# Patient Record
Sex: Female | Born: 1935 | Race: White | Hispanic: No | Marital: Married | Smoking: Never smoker
Health system: Southern US, Community
[De-identification: ages and names within clinical notes are randomized; demographics above are authoritative.]

## PROBLEM LIST (undated history)

## (undated) DIAGNOSIS — I729 Aneurysm of unspecified site: Secondary | ICD-10-CM

## (undated) DIAGNOSIS — M1990 Unspecified osteoarthritis, unspecified site: Secondary | ICD-10-CM

## (undated) DIAGNOSIS — J45909 Unspecified asthma, uncomplicated: Secondary | ICD-10-CM

## (undated) DIAGNOSIS — N3281 Overactive bladder: Secondary | ICD-10-CM

## (undated) DIAGNOSIS — N8110 Cystocele, unspecified: Secondary | ICD-10-CM

## (undated) DIAGNOSIS — K219 Gastro-esophageal reflux disease without esophagitis: Secondary | ICD-10-CM

## (undated) DIAGNOSIS — I82409 Acute embolism and thrombosis of unspecified deep veins of unspecified lower extremity: Secondary | ICD-10-CM

## (undated) DIAGNOSIS — R1310 Dysphagia, unspecified: Secondary | ICD-10-CM

## (undated) DIAGNOSIS — R1319 Other dysphagia: Secondary | ICD-10-CM

## (undated) DIAGNOSIS — I480 Paroxysmal atrial fibrillation: Secondary | ICD-10-CM

## (undated) DIAGNOSIS — I10 Essential (primary) hypertension: Secondary | ICD-10-CM

## (undated) DIAGNOSIS — I639 Cerebral infarction, unspecified: Secondary | ICD-10-CM

## (undated) DIAGNOSIS — M810 Age-related osteoporosis without current pathological fracture: Secondary | ICD-10-CM

## (undated) HISTORY — PX: WRIST FRACTURE SURGERY: SHX121

## (undated) HISTORY — PX: LEG SURGERY: SHX1003

## (undated) HISTORY — DX: Aneurysm of unspecified site: I72.9

## (undated) HISTORY — PX: NASAL SEPTUM SURGERY: SHX37

## (undated) HISTORY — PX: ABDOMINAL HYSTERECTOMY: SHX81

## (undated) HISTORY — DX: Essential (primary) hypertension: I10

## (undated) HISTORY — DX: Gastro-esophageal reflux disease without esophagitis: K21.9

## (undated) HISTORY — DX: Other dysphagia: R13.19

## (undated) HISTORY — DX: Cystocele, unspecified: N81.10

## (undated) HISTORY — DX: Dysphagia, unspecified: R13.10

## (undated) HISTORY — PX: ESOPHAGEAL DILATION: SHX303

## (undated) HISTORY — PX: ESOPHAGOGASTRODUODENOSCOPY: SHX1529

## (undated) HISTORY — PX: TONSILLECTOMY: SUR1361

---

## 1998-06-06 ENCOUNTER — Other Ambulatory Visit: Admission: RE | Admit: 1998-06-06 | Discharge: 1998-06-06 | Payer: Self-pay

## 2001-08-10 ENCOUNTER — Encounter: Payer: Self-pay | Admitting: Family Medicine

## 2001-08-10 ENCOUNTER — Ambulatory Visit (HOSPITAL_COMMUNITY): Admission: RE | Admit: 2001-08-10 | Discharge: 2001-08-10 | Payer: Self-pay | Admitting: Family Medicine

## 2001-08-20 ENCOUNTER — Ambulatory Visit (HOSPITAL_COMMUNITY): Admission: RE | Admit: 2001-08-20 | Discharge: 2001-08-20 | Payer: Self-pay | Admitting: Family Medicine

## 2001-08-20 ENCOUNTER — Encounter: Payer: Self-pay | Admitting: Family Medicine

## 2001-11-25 ENCOUNTER — Ambulatory Visit (HOSPITAL_COMMUNITY): Admission: RE | Admit: 2001-11-25 | Discharge: 2001-11-25 | Payer: Self-pay | Admitting: Internal Medicine

## 2002-02-10 ENCOUNTER — Encounter: Payer: Self-pay | Admitting: Internal Medicine

## 2002-02-10 ENCOUNTER — Ambulatory Visit (HOSPITAL_COMMUNITY): Admission: RE | Admit: 2002-02-10 | Discharge: 2002-02-10 | Payer: Self-pay | Admitting: Internal Medicine

## 2002-05-17 ENCOUNTER — Ambulatory Visit (HOSPITAL_COMMUNITY): Admission: RE | Admit: 2002-05-17 | Discharge: 2002-05-17 | Payer: Self-pay | Admitting: Internal Medicine

## 2002-07-14 ENCOUNTER — Encounter: Payer: Self-pay | Admitting: Family Medicine

## 2002-07-14 ENCOUNTER — Ambulatory Visit (HOSPITAL_COMMUNITY): Admission: RE | Admit: 2002-07-14 | Discharge: 2002-07-14 | Payer: Self-pay | Admitting: Family Medicine

## 2002-08-26 ENCOUNTER — Encounter: Payer: Self-pay | Admitting: Otolaryngology

## 2002-08-26 ENCOUNTER — Ambulatory Visit (HOSPITAL_COMMUNITY): Admission: RE | Admit: 2002-08-26 | Discharge: 2002-08-26 | Payer: Self-pay | Admitting: Ophthalmology

## 2003-09-15 ENCOUNTER — Ambulatory Visit (HOSPITAL_COMMUNITY): Admission: RE | Admit: 2003-09-15 | Discharge: 2003-09-15 | Payer: Self-pay | Admitting: Family Medicine

## 2004-02-01 ENCOUNTER — Ambulatory Visit (HOSPITAL_COMMUNITY): Admission: RE | Admit: 2004-02-01 | Discharge: 2004-02-01 | Payer: Self-pay | Admitting: Internal Medicine

## 2004-02-01 HISTORY — PX: COLONOSCOPY W/ BIOPSIES: SHX1374

## 2004-09-18 ENCOUNTER — Ambulatory Visit (HOSPITAL_COMMUNITY): Admission: RE | Admit: 2004-09-18 | Discharge: 2004-09-18 | Payer: Self-pay | Admitting: Family Medicine

## 2004-10-01 ENCOUNTER — Encounter: Admission: RE | Admit: 2004-10-01 | Discharge: 2004-10-01 | Payer: Self-pay | Admitting: Family Medicine

## 2005-10-28 ENCOUNTER — Ambulatory Visit (HOSPITAL_COMMUNITY): Admission: RE | Admit: 2005-10-28 | Discharge: 2005-10-28 | Payer: Self-pay | Admitting: Family Medicine

## 2006-11-25 ENCOUNTER — Other Ambulatory Visit: Admission: RE | Admit: 2006-11-25 | Discharge: 2006-11-25 | Payer: Self-pay | Admitting: Family Medicine

## 2009-10-02 ENCOUNTER — Encounter (HOSPITAL_COMMUNITY): Admission: RE | Admit: 2009-10-02 | Discharge: 2009-10-13 | Payer: Self-pay | Admitting: Family Medicine

## 2010-11-03 ENCOUNTER — Encounter: Payer: Self-pay | Admitting: Family Medicine

## 2010-12-28 ENCOUNTER — Ambulatory Visit
Admission: RE | Admit: 2010-12-28 | Discharge: 2010-12-28 | Disposition: A | Discharge status: Home / Self Care | Payer: Medicare Other | Source: Ambulatory Visit | Attending: Orthopedic Surgery | Admitting: Orthopedic Surgery

## 2010-12-28 ENCOUNTER — Other Ambulatory Visit: Payer: Self-pay | Admitting: Orthopedic Surgery

## 2010-12-28 DIAGNOSIS — T148XXA Other injury of unspecified body region, initial encounter: Secondary | ICD-10-CM

## 2011-01-01 ENCOUNTER — Encounter (HOSPITAL_COMMUNITY)
Admission: RE | Admit: 2011-01-01 | Discharge: 2011-01-01 | Disposition: A | Discharge status: Home / Self Care | Payer: Medicare Other | Source: Ambulatory Visit | Attending: Orthopedic Surgery | Admitting: Orthopedic Surgery

## 2011-01-01 DIAGNOSIS — Z01812 Encounter for preprocedural laboratory examination: Secondary | ICD-10-CM | POA: Insufficient documentation

## 2011-01-01 LAB — APTT: aPTT: 32 seconds (ref 24–37)

## 2011-01-01 LAB — BASIC METABOLIC PANEL
BUN: 16 mg/dL (ref 6–23)
CO2: 25 mEq/L (ref 19–32)
Calcium: 8.3 mg/dL — ABNORMAL LOW (ref 8.4–10.5)
Chloride: 105 mEq/L (ref 96–112)
Creatinine, Ser: 0.75 mg/dL (ref 0.4–1.2)
GFR calc Af Amer: >60 mL/min (ref >60)
GFR calc non Af Amer: >60 mL/min (ref >60)
Glucose, Bld: 120 mg/dL — ABNORMAL HIGH (ref 70–99)
Potassium: 4.1 mEq/L (ref 3.5–5.1)
Sodium: 136 mEq/L (ref 135–145)

## 2011-01-01 LAB — CBC
HCT: 32.3 % — ABNORMAL LOW (ref 36.0–46.0)
Hemoglobin: 10.3 g/dL — ABNORMAL LOW (ref 12.0–15.0)
MCH: 26.1 pg (ref 26.0–34.0)
MCHC: 31.9 g/dL (ref 30.0–36.0)
MCV: 82 fL (ref 78.0–100.0)
Platelets: 251 10*3/uL (ref 150–400)
RBC: 3.94 MIL/uL (ref 3.87–5.11)
RDW: 15.3 % (ref 11.5–15.5)
WBC: 6.2 10*3/uL (ref 4.0–10.5)

## 2011-01-01 LAB — SURGICAL PCR SCREEN
MRSA, PCR: NEGATIVE
Staphylococcus aureus: NEGATIVE

## 2011-01-01 LAB — PROTIME-INR
INR: 1.44 (ref 0.00–1.49)
Prothrombin Time: 17.7 seconds — ABNORMAL HIGH (ref 11.6–15.2)

## 2011-01-02 ENCOUNTER — Observation Stay (HOSPITAL_COMMUNITY)
Admission: RE | Admit: 2011-01-02 | Discharge: 2011-01-04 | Disposition: A | Discharge status: Home / Self Care | Payer: Medicare Other | Source: Ambulatory Visit | Attending: Orthopedic Surgery | Admitting: Orthopedic Surgery

## 2011-01-02 DIAGNOSIS — Z01812 Encounter for preprocedural laboratory examination: Secondary | ICD-10-CM | POA: Insufficient documentation

## 2011-01-02 DIAGNOSIS — X58XXXA Exposure to other specified factors, initial encounter: Secondary | ICD-10-CM | POA: Insufficient documentation

## 2011-01-02 DIAGNOSIS — S52599A Other fractures of lower end of unspecified radius, initial encounter for closed fracture: Principal | ICD-10-CM | POA: Insufficient documentation

## 2011-01-02 DIAGNOSIS — J45909 Unspecified asthma, uncomplicated: Secondary | ICD-10-CM | POA: Insufficient documentation

## 2011-01-04 LAB — PROTIME-INR
INR: 1.43 (ref 0.00–1.49)
Prothrombin Time: 17.6 seconds — ABNORMAL HIGH (ref 11.6–15.2)

## 2011-01-09 NOTE — Op Note (Signed)
NAMEAmariss, Detamore Evamae                    ACCOUNT NO.:  [id number]  MEDICAL RECORD NO.:  [id number]           PATIENT TYPE:  O  LOCATION:  5035                         FACILITY:  [facility]  PHYSICIAN:  Madelynn Done, MD  DATE OF BIRTH:  [birth year]  DATE OF PROCEDURE:  01/02/2011 DATE OF DISCHARGE:                              OPERATIVE REPORT   PREOPERATIVE DIAGNOSIS:  Left wrist highly comminuted intra-articular distal radius fracture, 4 or more fragments.  POSTOPERATIVE DIAGNOSIS:  Left wrist highly comminuted intra-articular distal radius fracture, 4 or more fragments.  ATTENDING PHYSICIAN:  Madelynn Done, MD, who scrubbed and present for the entire procedure.  ASSISTANT SURGEON:  None.  ANESTHESIA:  Supraclavicular block and general anesthesia versus general anesthesia with LMA.  SURGICAL PROCEDURES: 1. Open treatment of left wrist intra-articular distal radius     fracture, 4 or more fragments. 2. Stress radiography, left wrist.  SURGICAL IMPLANTS:  Synthes two-column volar plate, 6 distal locking pegs and screws, and three bicortical screws proximally, one nonlocking and two locking.  SURGICAL INDICATIONS:  Ms. Peltz is an 77 year old female who sustained a closed injury to her left distal radius.  The patient seen and evaluated at [facility] and given the nature and degree of displacement, she was seen and evaluated in my office.  It was recommended that she undergo the above procedure.  Risks, benefits, and alternatives were discussed in detail with the patient and a signed informed consent was obtained.  Risks include but not limited to bleeding; infection; damage to nearby nerves, arteries, or tendons; loss of motion of the wrist and digits; and need for further surgical intervention; malunion; nonunion; hardware failure; need for further surgical intervention.  Intraoperative bone grafting, Synthes DBX 2.5 mL mixture.  SURGICAL PROCEDURE:  The patient was  appropriately identified in the preop holding area.  A mark with a permanent marker made on the left wrist to indicate the correct operative site.  The patient was then brought back to the operating room and placed supine on the anesthesia table.  The patient had undergone supraclavicular block performed by Dr. Noreene Larsson.  The patient was then brought back to the operating room and placed supine on the anesthesia table.  General anesthesia was ministered.  A well-padded tourniquet was then placed in the left brachium, sealed with 1000 drape.  Left upper extremity was prepped and draped in normal sterile fashion.  Time-out was called and correct site was identified and procedure was then begun.  The patient received preoperative antibiotics prior to skin incision.  Time-out was called and correct site was identified and the procedure was then begun.  Based on the volar displacement, it was felt to indicate to proceed with a volar approach.  Limb was then elevated and tourniquet insufflated.  A longitudinal incision was made directly over the FCR sheath.  Dissection was then carried down through the skin and subcutaneous tissues.  The FCR sheath was then opened both proximally and distally.  The FCR was then retracted into respective direction.  Going through the floor of the FCR, the  FPL was identified.  The pronator quadratus was then identified and elevated.  The fracture site was then opened.  The patient did have high degree of comminution, and a very poor bone quality.  The Synthes DBX bone graft hybrid was then placed into the wrist in the defect and packed dorsally.  Following this, a longitudinal traction was then applied and the volar plate was then applied to the volar surface.  It was then fixed distally with the K-wire and then using the oblong screw hole, the 2.7-mm screw was then placed and the plate height was then adjusted using the mini C-arm.  After a mini C-arm guidance and  placement of the plate, distal fixation was then begun beginning from an ulnar to radial direction.  A combination of distal locking pegs and screws were then placed, a total of 6 fixation points distally.  This served as a nice buttress of the volar displacement. Following this, two more bicortical locking screws were then placed proximally.  The wounds were then thoroughly irrigated.  Stress radiography was then carried out showing good reduction and alignment of the distal radioulnar joint and volar rim.  The patient did have the articular comminution.  This was a 4 or more fragments, highly comminuted fracture.  It was stable and felt to be in stable position after volar plate fixation.  Copious irrigation was done throughout. The pronator quadratus was closed with 2-0 Vicryl.  Subcutaneous tissue was closed with 4-0 Vicryl.  The skin closed with 4-0 Prolene horizontal mattress sutures.  Adaptic dressing, sterile compressive bandage was then applied.  The patient was then placed in a well-padded sugar-tong splint.  She was then extubated and taken to the recovery room in good condition.  POSTOPERATIVE PLAN:  The patient will be admitted for IV antibiotics and pain control, discharge in the morning, seen back in the office in approximately 12 days, x-rays, application of a long-arm cast for a total of 3 weeks, short-arm cast for 2 weeks, and then begin a therapy regimen.  Radiographs at each visit.     Madelynn Done, MD     FWO/MEDQ  D:  01/02/2011  T:  01/03/2011  Job:  [id number]  Electronically Signed by Bradly Bienenstock IV MD on 01/09/2011 02:52:56 PM

## 2011-01-09 NOTE — Discharge Summary (Signed)
  NAMEBlasa, Raisch Garcia                    ACCOUNT NO.:  [id number]  MEDICAL RECORD NO.:  [id number]           PATIENT TYPE:  O  LOCATION:  5035                         FACILITY:  [facility]  PHYSICIAN:  Madelynn Done, MD  DATE OF BIRTH:  [birth year]  DATE OF ADMISSION:  01/02/2011 DATE OF DISCHARGE:  01/04/2011                              DISCHARGE SUMMARY   REASON FOR ADMISSION:  Left distal radius fracture.  DISCHARGE DIAGNOSIS:  Left distal radius fracture.  PROCEDURE:  Open reduction and internal fixation of displaced distal radius fracture __________  DISCHARGE MEDICATIONS: 1. Percocet 5/325 1-2 tablets q.4-6 h. as needed for pain. 2. Colace 100 mg p.o. b.i.d. 3. Robaxin 500 mg p.o. p.r.n. spasm. 4. Resume home medications and doses.  BRIEF HISTORY:  Mr. Sara Garcia is an 77 year old right-hand-dominant female who sustained a closed injury to her left distal radius.  The patient seen and evaluated in the office and recommended to undergo the above procedure following her injury.  The patient was admitted for IV antibiotics and pain control.  HOSPITAL COURSE:  The patient was admitted to the orthopedic service following the surgery.  The patient tolerated general anesthetic.  The patient was continued on the IV pain medication on postoperative day #1. She was seen by physical therapy on postop day #2 and felt ready to be discharged home.  On the day of discharge, the patient is afebrile with vital signs stable and normal, tolerating regular diet, felt ready to be discharged home.  RECOMMENDATIONS AND DISPOSITION:  The patient will be discharged to home and will be seen back in the office in approximately 2 weeks for repeat wound check.  Keep ice, elevate, keep her bandage on it all times.  She is going to come back.  She is to continue the above discharge medications.  Prior to discharge, the patient is the patient's discharge instructions were explained to her, questions answered,  and the patient voiced understanding of plan.  CONDITION ON DISCHARGE:  Good.     Madelynn Done, MD    FWO/MEDQ  D:  01/04/2011  T:  01/05/2011  Job:  [id number]  Electronically Signed by Bradly Bienenstock IV MD on 01/09/2011 02:53:01 PM

## 2011-03-01 NOTE — Op Note (Signed)
NAME:  Sara Garcia, Sara Garcia                              ACCOUNT NO.:  [id number]   MEDICAL RECORD NO.:  [id number]                   PATIENT TYPE:  AMB   LOCATION:  DAY                                  FACILITY:  [facility]   PHYSICIAN:  R. Roetta Sessions, M.D.              DATE OF BIRTH:  October 28, 1927   DATE OF PROCEDURE:  DATE OF DISCHARGE:                                 OPERATIVE REPORT   PROCEDURE:  Colonoscopy with snare polypectomy and biopsy.   ENDOSCOPIST:  Gerrit Friends. Rourk, M.D.   INDICATIONS FOR PROCEDURE:  The patient is a 77 year old lady sent over at  the courtesy of Dr. Dimple Casey for colorectal cancer screening.  She has never had  a colonoscopy.  She is devoid of any lower GI tract symptoms.  There is no  family history of colorectal neoplasia.  Colonoscopy is now being done as a  standard screening maneuver.  This approach has been discussed with the  patient at length.  The potential risks, benefits, and alternatives have  been reviewed; questions answered.  She is agreeable.  Please see my  handwritten H&P for more information.   PROCEDURE NOTE:  O2 saturation, blood pressure, pulse and respirations were  monitored throughout the entire procedure.  Conscious sedation: Versed 4 mg IV, Demerol 50 mg IV in divided doses.   INSTRUMENT:  Olympus pediatric colonoscope.   FINDINGS:  A digital exam revealed no abnormalities.   ENDOSCOPIC FINDINGS:  The prep was good.   RECTUM:  Examination of the rectal mucosa including a retroflex view of the  anal verge revealed two anal papillae.  Otherwise negative rectal mucosa.   COLON:  The colonic mucosa was surveyed from the rectosigmoid junction  through the left transverse and right colon to the area of the appendiceal  orifice, ileocecal valve, and cecum.  These structures were well seen and  photographed for the record.   From this level the scope was slowly withdrawn.  All previously mentioned  mucosal surfaces were again seen.  The  patient was noted to have two 4-mm  polyps in the cecum. One was cold snared, the other was cold biopsied.  The  patient was also noted to have sigmoid diverticula.  The remainder of the  colonic mucosa appeared normal.  The patient tolerated the procedure well  and was reacted in endoscopy.   IMPRESSION:  1. Anal papilla with normal rectum.  2. Sigmoid diverticula.  3. Small polyps in the cecum removed as described above.   RECOMMENDATIONS:  1. Diverticulosis literature provided to the patient.  2. Follow up on path.  3. Further recommendations to follow.      ___________________________________________  Jonathon Bellows, M.D.   RMR/MEDQ  D:  02/01/2004  Garcia:  02/01/2004  Job:  [id number]   cc:   Magnus Sinning. Dimple Casey, M.D.  [address] [place]  [place] [address]  Fax: [phone]   R. Roetta Sessions, M.D.  [address]  [place]  [place] [address]  Fax: [phone]

## 2013-03-19 DIAGNOSIS — M6281 Muscle weakness (generalized): Secondary | ICD-10-CM

## 2013-10-20 ENCOUNTER — Observation Stay (HOSPITAL_COMMUNITY)
Admission: EM | Admit: 2013-10-20 | Discharge: 2013-10-21 | Disposition: A | Discharge status: Home / Self Care | Payer: Medicare HMO | Attending: Family Medicine | Admitting: Family Medicine

## 2013-10-20 ENCOUNTER — Other Ambulatory Visit: Payer: Self-pay

## 2013-10-20 ENCOUNTER — Encounter (HOSPITAL_COMMUNITY): Payer: Self-pay | Admitting: Emergency Medicine

## 2013-10-20 ENCOUNTER — Emergency Department (HOSPITAL_COMMUNITY): Payer: Medicare HMO

## 2013-10-20 DIAGNOSIS — R55 Syncope and collapse: Principal | ICD-10-CM | POA: Diagnosis present

## 2013-10-20 DIAGNOSIS — Z7901 Long term (current) use of anticoagulants: Secondary | ICD-10-CM | POA: Insufficient documentation

## 2013-10-20 DIAGNOSIS — Z86718 Personal history of other venous thrombosis and embolism: Secondary | ICD-10-CM | POA: Insufficient documentation

## 2013-10-20 HISTORY — DX: Unspecified asthma, uncomplicated: J45.909

## 2013-10-20 HISTORY — DX: Age-related osteoporosis without current pathological fracture: M81.0

## 2013-10-20 HISTORY — DX: Unspecified osteoarthritis, unspecified site: M19.90

## 2013-10-20 HISTORY — DX: Acute embolism and thrombosis of unspecified deep veins of unspecified lower extremity: I82.409

## 2013-10-20 LAB — CBC WITH DIFFERENTIAL/PLATELET
Basophils Absolute: 0 10*3/uL (ref 0.0–0.1)
Basophils Relative: 1 % (ref 0–1)
Eosinophils Absolute: 0.2 10*3/uL (ref 0.0–0.7)
Eosinophils Relative: 3 % (ref 0–5)
HCT: 40.7 % (ref 36.0–46.0)
Hemoglobin: 13.4 g/dL (ref 12.0–15.0)
Lymphocytes Relative: 26 % (ref 12–46)
Lymphs Abs: 1.8 10*3/uL (ref 0.7–4.0)
MCH: 30.8 pg (ref 26.0–34.0)
MCHC: 32.9 g/dL (ref 30.0–36.0)
MCV: 93.6 fL (ref 78.0–100.0)
Monocytes Absolute: 0.3 10*3/uL (ref 0.1–1.0)
Monocytes Relative: 5 % (ref 3–12)
Neutro Abs: 4.5 10*3/uL (ref 1.7–7.7)
Neutrophils Relative %: 65 % (ref 43–77)
Platelets: 218 10*3/uL (ref 150–400)
RBC: 4.35 MIL/uL (ref 3.87–5.11)
RDW: 14 % (ref 11.5–15.5)
WBC: 6.9 10*3/uL (ref 4.0–10.5)

## 2013-10-20 LAB — URINALYSIS, ROUTINE W REFLEX MICROSCOPIC
Bilirubin Urine: NEGATIVE
Glucose, UA: NEGATIVE mg/dL
Hgb urine dipstick: NEGATIVE
Ketones, ur: NEGATIVE mg/dL
Leukocytes, UA: NEGATIVE
Nitrite: NEGATIVE
Protein, ur: NEGATIVE mg/dL
Specific Gravity, Urine: 1.015 (ref 1.005–1.030)
Urobilinogen, UA: 0.2 mg/dL (ref 0.0–1.0)
pH: 6 (ref 5.0–8.0)

## 2013-10-20 LAB — BASIC METABOLIC PANEL
BUN: 21 mg/dL (ref 6–23)
CO2: 27 mEq/L (ref 19–32)
Calcium: 9 mg/dL (ref 8.4–10.5)
Chloride: 103 mEq/L (ref 96–112)
Creatinine, Ser: 0.81 mg/dL (ref 0.50–1.10)
GFR calc Af Amer: 76 mL/min — ABNORMAL LOW (ref >90)
GFR calc non Af Amer: 65 mL/min — ABNORMAL LOW (ref >90)
Glucose, Bld: 113 mg/dL — ABNORMAL HIGH (ref 70–99)
Potassium: 4.1 mEq/L (ref 3.7–5.3)
Sodium: 141 mEq/L (ref 137–147)

## 2013-10-20 LAB — PROTIME-INR
INR: 1.72 — ABNORMAL HIGH (ref 0.00–1.49)
Prothrombin Time: 19.7 seconds — ABNORMAL HIGH (ref 11.6–15.2)

## 2013-10-20 LAB — TROPONIN I
Troponin I: <0.3 ng/mL (ref <0.30)
Troponin I: <0.3 ng/mL (ref <0.30)

## 2013-10-20 MED ORDER — IOHEXOL 300 MG/ML  SOLN
50.0000 mL | Freq: Once | INTRAMUSCULAR | Status: AC | PRN
  Administered 2013-10-20: 50 mL via ORAL

## 2013-10-20 MED ORDER — MEMANTINE HCL 10 MG PO TABS
5.0000 mg | ORAL_TABLET | Freq: Two times a day (BID) | ORAL | Status: DC
  Administered 2013-10-20 – 2013-10-21 (×2): 5 mg via ORAL
  Filled 2013-10-20 (×2): qty 1

## 2013-10-20 MED ORDER — PANTOPRAZOLE SODIUM 40 MG PO TBEC
40.0000 mg | DELAYED_RELEASE_TABLET | Freq: Every day | ORAL | Status: DC
  Administered 2013-10-21: 40 mg via ORAL
  Filled 2013-10-20 (×2): qty 1

## 2013-10-20 MED ORDER — DULOXETINE HCL 60 MG PO CPEP
60.0000 mg | ORAL_CAPSULE | Freq: Every day | ORAL | Status: DC
  Administered 2013-10-21: 60 mg via ORAL
  Filled 2013-10-20 (×2): qty 1

## 2013-10-20 MED ORDER — ACETAMINOPHEN 500 MG PO TABS
1000.0000 mg | ORAL_TABLET | Freq: Every day | ORAL | Status: DC
  Administered 2013-10-21: 1000 mg via ORAL
  Filled 2013-10-20 (×3): qty 2

## 2013-10-20 MED ORDER — WARFARIN SODIUM 5 MG PO TABS
5.0000 mg | ORAL_TABLET | Freq: Every day | ORAL | Status: DC
  Administered 2013-10-20 – 2013-10-21 (×2): 5 mg via ORAL
  Filled 2013-10-20 (×2): qty 1

## 2013-10-20 MED ORDER — VITAMIN B-6 50 MG PO TABS
100.0000 mg | ORAL_TABLET | Freq: Every day | ORAL | Status: DC
Start: 2013-10-21 — End: 2013-10-21
  Administered 2013-10-21: 100 mg via ORAL
  Filled 2013-10-20 (×2): qty 2

## 2013-10-20 MED ORDER — VITAMIN B-12 1000 MCG PO TABS
1000.0000 ug | ORAL_TABLET | Freq: Every day | ORAL | Status: DC
Start: 2013-10-21 — End: 2013-10-21
  Administered 2013-10-21: 1000 ug via ORAL
  Filled 2013-10-20 (×2): qty 1

## 2013-10-20 MED ORDER — SODIUM CHLORIDE 0.9 % IV BOLUS (SEPSIS)
1000.0000 mL | Freq: Once | INTRAVENOUS | Status: AC
  Administered 2013-10-20: 1000 mL via INTRAVENOUS

## 2013-10-20 MED ORDER — IOHEXOL 350 MG/ML SOLN
100.0000 mL | Freq: Once | INTRAVENOUS | Status: AC | PRN
Start: 2013-10-20 — End: 2013-10-20
  Administered 2013-10-20: 100 mL via INTRAVENOUS

## 2013-10-20 MED ORDER — SODIUM CHLORIDE 0.9 % IV BOLUS (SEPSIS)
500.0000 mL | Freq: Once | INTRAVENOUS | Status: AC
  Administered 2013-10-20: 500 mL via INTRAVENOUS

## 2013-10-20 MED ORDER — SODIUM CHLORIDE 0.9 % IJ SOLN
3.0000 mL | Freq: Two times a day (BID) | INTRAMUSCULAR | Status: DC
  Administered 2013-10-20: 3 mL via INTRAVENOUS

## 2013-10-20 NOTE — ED Notes (Signed)
ems states pt started having abd pain. When got out of car pt had syncopal episode. Pt did not fall. cbg 102 in route. NSR with EMS. Alert/oriented at present with hx of dementia.

## 2013-10-20 NOTE — H&P (Signed)
ELLEANOR GUYETT MRN: [id number] DOB/AGE: 78-Mar-1929 78 y.o. Primary Care Physician:BURDINE,STEVEN E, MD Admit date: 10/20/2013 Chief Complaint: Syncope. HPI: This very pleasant 80 year old lady presents with a syncopal episode. Earlier today, approximately 9 hours ago she started feeling unwell while driving her car for a doctor's appointment of her husband. She had lower abdominal pain which was cramping in nature and associated with a hot feeling throughout her body. This was also associated with some visual changes and inability to focus. When she arrived at the doctor's office, she got from her car and her legs gave way and then she lost consciousness. She thinks she lost consciousness for approximately 5 minutes. There was no witnessed tongue biting, shaking of her limbs or any change in color of her body. She regained consciousness and was completely orientated. She was taken to the emergency room for further evaluation. She does feel perfectly fine now and is back to her normal self. She denies any chest pain, palpitations or any limb weakness. She had a similar episode approximately 6 months ago. Investigations of this at another hospital did not reveal any abnormalities.  Past Medical History  Diagnosis Date  . Arthritis   . Asthma   . Osteoporosis   . DVT (deep venous thrombosis)   . Dementia    Past Surgical History  Procedure Laterality Date  . Abdominal hysterectomy    . Nasal septum surgery          History reviewed. No pertinent family history.  Social History:  reports that she has never smoked. She does not have any smokeless tobacco history on file. She reports that she does not drink alcohol or use illicit drugs. She is married and lives with her husband.  Allergies:  Allergies  Allergen Reactions  . Adhesive [Tape]   . Ciprofloxacin     vomiting  . Codeine     Upsets stomach, nightmares  . Latex     Breaking out and itching  . Penicillins     hives  . Sulfa  Antibiotics     hives    Medications Prior to Admission  Medication Sig Dispense Refill  . acetaminophen (TYLENOL) 500 MG tablet Take 1,000 mg by mouth daily.      . DULoxetine (CYMBALTA) 60 MG capsule Take 60 mg by mouth daily.      Marland Kitchen esomeprazole (NEXIUM) 40 MG capsule Take 40 mg by mouth daily at 12 noon.      . memantine (NAMENDA) 5 MG tablet Take 5 mg by mouth 2 (two) times daily.      Marland Kitchen pyridOXINE (VITAMIN B-6) 100 MG tablet Take 100 mg by mouth daily.      . vitamin B-12 (CYANOCOBALAMIN) 1000 MCG tablet Take 1,000 mcg by mouth daily.      Marland Kitchen warfarin (COUMADIN) 5 MG tablet Take 5 mg by mouth daily.           LZJ:QBHAL from the symptoms mentioned above,there are no other symptoms referable to all systems reviewed.  Physical Exam: Blood pressure 147/84, pulse 120, temperature 98.1 F (36.7 C), temperature source Oral, resp. rate 20, height 5\' 2"  (1.575 m), weight 61.5 kg (135 lb 9.3 oz), SpO2 97.00%. She looks systemically well. She is alert and oriented without any focal neurological signs. Heart sounds are present without any murmurs. There is no gallop rhythm. There are no carotid bruits. Lung fields are clear. Abdomen is soft and nontender without any hepatosplenomegaly. There is no evidence of abdominal aortic aneurysm.  Recent Labs  10/20/13 1229  WBC 6.9  NEUTROABS 4.5  HGB 13.4  HCT 40.7  MCV 93.6  PLT 218    Recent Labs  10/20/13 1229  NA 141  K 4.1  CL 103  CO2 27  GLUCOSE 113*  BUN 21  CREATININE 0.81  CALCIUM 9.0         Ct Head Wo Contrast  10/20/2013   CLINICAL DATA:  Syncope.  EXAM: CT HEAD WITHOUT CONTRAST  TECHNIQUE: Contiguous axial images were obtained from the base of the skull through the vertex without intravenous contrast.  COMPARISON:  Head CT 03/19/2013.  FINDINGS: Mild cerebral and cerebellar atrophy. Patchy and confluent areas of decreased attenuation are noted throughout the deep and periventricular white matter of the cerebral  hemispheres bilaterally, compatible with chronic microvascular ischemic disease. No acute intracranial abnormalities. Specifically, no evidence of acute intracranial hemorrhage, no definite findings of acute/subacute cerebral ischemia, no mass, mass effect, hydrocephalus or abnormal intra or extra-axial fluid collections. Visualized paranasal sinuses and mastoids are generally well pneumatized, with exception of mucoperiosteal thickening and opacification of the left sphenoid sinus, increased compared to the prior study. No acute displaced skull fractures are identified.  IMPRESSION: 1. No acute intracranial abnormalities. 2. Mild cerebral and cerebellar atrophy with extensive chronic microvascular ischemic changes in the cerebral white matter redemonstrated, as above. 3. Opacification and mucoperiosteal thickening in the left sphenoid sinus indicative of chronic sinusitis.   Electronically Signed   By: Vinnie Langton M.D.   On: 10/20/2013 16:35   Ct Angio Chest Pe W/cm &/or Wo Cm  10/20/2013   CLINICAL DATA:  Syncope. Left lower quadrant abdominal pain. Leg weakness. Sudden onset. History of deep vein thrombosis.  EXAM: CT ANGIOGRAPHY CHEST  CT ABDOMEN AND PELVIS WITH CONTRAST  TECHNIQUE: Multidetector CT imaging of the chest was performed using the standard protocol during bolus administration of intravenous contrast. Multiplanar CT image reconstructions including MIPs were obtained to evaluate the vascular anatomy. Multidetector CT imaging of the abdomen and pelvis was performed using the standard protocol during bolus administration of intravenous contrast.  CONTRAST:  77mL OMNIPAQUE IOHEXOL 300 MG/ML SOLN, 146mL OMNIPAQUE IOHEXOL 350 MG/ML SOLN  COMPARISON:  DG HIP COMPLETE 2+V BILAT dated 09/03/2010; DG CHEST 1V PORT dated 03/19/2013; CT ABD-PELV W/ CM dated 04/24/2010; CT ABD-PELV W/ CM dated 08/30/2008; CT ANGIO CHEST dated 01/16/2009  FINDINGS: CTA CHEST FINDINGS  No filling defect is identified in the  pulmonary arterial tree to suggest pulmonary embolus. No aortic dissection or acute aortic findings. Right infrahilar node short axis 0.8 cm. Volume loss in the right middle lobe is associated with a bandlike 6.5 by 2.2 by 1.9 cm opacity which appears more prominent than lobular than on prior exams.  Several bulla are present in the right lung. Subsegmental atelectasis, left lower lobe.  CT ABDOMEN and PELVIS FINDINGS  The liver, spleen, pancreas, and adrenal glands appear unremarkable. Gallbladder contracted. No significant biliary dilatation observed.  Dextroconvex lumbar scoliosis with rotary component. Borderline fullness of the left collecting system without hydroureter or stone observed.  Urinary bladder unremarkable. Orally administered contrast extends through to the rectum. Scattered sigmoid colon diverticular present. Rectum is slightly distended.  Terminal ileum unremarkable. No pathologic upper abdominal adenopathy is observed. No pathologic pelvic adenopathy is observed.  There is moderate degenerative arthropathy of both hips. Prominent lumbar spondylosis.  Review of the MIP images confirms the above findings.  IMPRESSION: 1. Increase in size and lobularity of the right middle lobe  opacity that has been shown on multiple prior exams. This likely represents progressive atelectasis/scarring given the fact that there is more downward retraction of the minor fissure, rather than a new underlying mass, but warrants observation. Consider followup chest CT in 6 months time. 2. No embolus or dissection identified. 3. Prominent lumbar spondylosis and scoliosis. 4. Scattered sigmoid colon diverticula. No active diverticulitis identified. 5. Mild rectal distention, likely incidental.   Electronically Signed   By: Sherryl Barters M.D.   On: 10/20/2013 17:04   Ct Abdomen Pelvis W Contrast  10/20/2013   CLINICAL DATA:  Syncope. Left lower quadrant abdominal pain. Leg weakness. Sudden onset. History of deep vein  thrombosis.  EXAM: CT ANGIOGRAPHY CHEST  CT ABDOMEN AND PELVIS WITH CONTRAST  TECHNIQUE: Multidetector CT imaging of the chest was performed using the standard protocol during bolus administration of intravenous contrast. Multiplanar CT image reconstructions including MIPs were obtained to evaluate the vascular anatomy. Multidetector CT imaging of the abdomen and pelvis was performed using the standard protocol during bolus administration of intravenous contrast.  CONTRAST:  86mL OMNIPAQUE IOHEXOL 300 MG/ML SOLN, 127mL OMNIPAQUE IOHEXOL 350 MG/ML SOLN  COMPARISON:  DG HIP COMPLETE 2+V BILAT dated 09/03/2010; DG CHEST 1V PORT dated 03/19/2013; CT ABD-PELV W/ CM dated 04/24/2010; CT ABD-PELV W/ CM dated 08/30/2008; CT ANGIO CHEST dated 01/16/2009  FINDINGS: CTA CHEST FINDINGS  No filling defect is identified in the pulmonary arterial tree to suggest pulmonary embolus. No aortic dissection or acute aortic findings. Right infrahilar node short axis 0.8 cm. Volume loss in the right middle lobe is associated with a bandlike 6.5 by 2.2 by 1.9 cm opacity which appears more prominent than lobular than on prior exams.  Several bulla are present in the right lung. Subsegmental atelectasis, left lower lobe.  CT ABDOMEN and PELVIS FINDINGS  The liver, spleen, pancreas, and adrenal glands appear unremarkable. Gallbladder contracted. No significant biliary dilatation observed.  Dextroconvex lumbar scoliosis with rotary component. Borderline fullness of the left collecting system without hydroureter or stone observed.  Urinary bladder unremarkable. Orally administered contrast extends through to the rectum. Scattered sigmoid colon diverticular present. Rectum is slightly distended.  Terminal ileum unremarkable. No pathologic upper abdominal adenopathy is observed. No pathologic pelvic adenopathy is observed.  There is moderate degenerative arthropathy of both hips. Prominent lumbar spondylosis.  Review of the MIP images confirms the  above findings.  IMPRESSION: 1. Increase in size and lobularity of the right middle lobe opacity that has been shown on multiple prior exams. This likely represents progressive atelectasis/scarring given the fact that there is more downward retraction of the minor fissure, rather than a new underlying mass, but warrants observation. Consider followup chest CT in 6 months time. 2. No embolus or dissection identified. 3. Prominent lumbar spondylosis and scoliosis. 4. Scattered sigmoid colon diverticula. No active diverticulitis identified. 5. Mild rectal distention, likely incidental.   Electronically Signed   By: Sherryl Barters M.D.   On: 10/20/2013 17:04   Impression: 1. Syncope of unclear etiology. 2. 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 3. History of dementia per chart, I think this is probably mild based on my examination of her today.     Plan: 1. Admit to telemetry. 2. Serial cardiac enzymes. 3. Echocardiogram. Based on negative physical examination, I would not be surprised if there are no obvious findings on investigations. Anticipate discharge in the morning if she remains clinically stable.      [place] C  10/20/2013, 7:57 PM

## 2013-10-20 NOTE — ED Provider Notes (Addendum)
CSN: [id number]     Arrival date & time 10/20/13  1211 History   First MD Initiated Contact with Patient 10/20/13 1344 This chart was scribed for Shaune Pollack, MD by Anastasia Pall, ED Scribe. This patient was seen in room APA17/APA17 and the patient's care was started at 2:08 PM.      Chief Complaint  Patient presents with  . Abdominal Pain    Patient is a 80 y.o. female presenting with syncope. The history is provided by the patient and a relative. No language interpreter was used.  Loss of Consciousness Episode history:  Multiple Most recent episode:  Today Progression:  Improving Chronicity:  Recurrent Context: sitting down   Witnessed: yes   Associated symptoms: dizziness (light-headedness) and weakness (bilateral LE)   Associated symptoms: no chest pain, no difficulty breathing, no fever, no headaches, no nausea, no recent injury, no visual change and no vomiting   Risk factors comment:  H/o multiple blood clot, syncopal episodes  HPI Comments: Sara Garcia is a 80 y.o. female brought in by EMS who presents to the Emergency Department complaining of one syncopal episode, onset earlier today when she was taking her husband to his doctor. She states she was in her car, and starting experiencing cramping, left, lower abdominal pain, mild light-headedness, weakness in her legs, and states she started not being able to focus her eyes. She reports she parked the car, and walked inside the doctor's office, and then passed out. She denies any injuries from her fall, stating someone caught her before she hit the ground. An ambulance was called after her episode.   She reports eating and drinking normally. She states she has eaten breakfast, but has not eaten lunch today. She reports having normal bowel movement today. She denies prior headache, fever, cough, nausea, vomiting, diarrhea, and any other associated symptoms.   She reports h/o osteoporosis, h/o multiple blood clots. Relative reports  leg swelling, pain, weakness, and syncopal episode prior to her first blood clot. She reports being on Coumadin due to h/o blood clot. She reports having her INR checked 1 month ago. She denies current chest pain. Relative reports that pt has h/o passing out due to not eating normally. Pt reports having urinary frequency and urgency for over 1 year. She denies h/o smoking.    PCP - Curlene Labrum, MD  Past Medical History  Diagnosis Date  . Arthritis   . Asthma   . Osteoporosis   . DVT (deep venous thrombosis)   . Dementia    Past Surgical History  Procedure Laterality Date  . Abdominal hysterectomy    . Nasal septum surgery     History reviewed. No pertinent family history. History  Substance Use Topics  . Smoking status: Never Smoker   . Smokeless tobacco: Not on file  . Alcohol Use: No   OB History   Grav Para Term Preterm Abortions TAB SAB Ect Mult Living                 Review of Systems  Constitutional: Negative for fever.  HENT: Negative for congestion.   Respiratory: Negative for cough.   Cardiovascular: Positive for syncope. Negative for chest pain.  Gastrointestinal: Positive for abdominal pain (cramping, lower). Negative for nausea, vomiting and diarrhea.  Genitourinary: Positive for urgency and frequency.  Musculoskeletal: Negative for arthralgias and myalgias.  Skin: Negative for wound.  Neurological: Positive for dizziness (light-headedness), syncope, weakness (bilateral LE) and light-headedness. Negative for headaches.  Allergies  Adhesive; Ciprofloxacin; Codeine; Latex; Penicillins; and Sulfa antibiotics  Home Medications   Current Outpatient Rx  Name  Route  Sig  Dispense  Refill  . acetaminophen (TYLENOL) 500 MG tablet   Oral   Take 1,000 mg by mouth daily.         . DULoxetine (CYMBALTA) 60 MG capsule   Oral   Take 60 mg by mouth daily.         Marland Kitchen esomeprazole (NEXIUM) 40 MG capsule   Oral   Take 40 mg by mouth daily at 12 noon.          . memantine (NAMENDA) 5 MG tablet   Oral   Take 5 mg by mouth 2 (two) times daily.         Marland Kitchen pyridOXINE (VITAMIN B-6) 100 MG tablet   Oral   Take 100 mg by mouth daily.         . vitamin B-12 (CYANOCOBALAMIN) 1000 MCG tablet   Oral   Take 1,000 mcg by mouth daily.         Marland Kitchen warfarin (COUMADIN) 5 MG tablet   Oral   Take 5 mg by mouth daily.          BP 132/77  Pulse 75  Temp(Src) 97.9 F (36.6 C)  Resp 18  SpO2 96%  Physical Exam  Nursing note and vitals reviewed. Constitutional: She is oriented to person, place, and time. She appears well-developed and well-nourished. No distress.  HENT:  Head: Normocephalic and atraumatic.  Eyes: EOM are normal.  Neck: Neck supple. No tracheal deviation present.  Cardiovascular: Normal rate, regular rhythm, normal heart sounds and intact distal pulses.   No murmur heard. Pulmonary/Chest: Effort normal and breath sounds normal. No respiratory distress. She has no wheezes. She has no rales.  Abdominal: There is tenderness (Diffuse LLQ tenderness to palpation.).  Musculoskeletal: Normal range of motion.  Neurological: She is alert and oriented to person, place, and time.  Skin: Skin is warm and dry.  Chronic skin changes over left LE.   Psychiatric: She has a normal mood and affect. Her behavior is normal.    ED Course  Procedures (including critical care time)  DIAGNOSTIC STUDIES: Oxygen Saturation is 96% on room air, normal by my interpretation.    COORDINATION OF CARE: 2:19 PM-Discussed treatment plan which includes CXR, CT abdomen with pt at bedside and pt agreed to plan.    Labs Review Labs Reviewed  BASIC METABOLIC PANEL - Abnormal; Notable for the following:    Glucose, Bld 113 (*)    GFR calc non Af Amer 65 (*)    GFR calc Af Amer 76 (*)    All other components within normal limits  PROTIME-INR - Abnormal; Notable for the following:    Prothrombin Time 19.7 (*)    INR 1.72 (*)    All other  components within normal limits  CBC WITH DIFFERENTIAL  URINALYSIS, ROUTINE W REFLEX MICROSCOPIC  TROPONIN I   Imaging Review Ct Head Wo Contrast  10/20/2013   CLINICAL DATA:  Syncope.  EXAM: CT HEAD WITHOUT CONTRAST  TECHNIQUE: Contiguous axial images were obtained from the base of the skull through the vertex without intravenous contrast.  COMPARISON:  Head CT 03/19/2013.  FINDINGS: Mild cerebral and cerebellar atrophy. Patchy and confluent areas of decreased attenuation are noted throughout the deep and periventricular white matter of the cerebral hemispheres bilaterally, compatible with chronic microvascular ischemic disease. No acute intracranial abnormalities. Specifically, no  evidence of acute intracranial hemorrhage, no definite findings of acute/subacute cerebral ischemia, no mass, mass effect, hydrocephalus or abnormal intra or extra-axial fluid collections. Visualized paranasal sinuses and mastoids are generally well pneumatized, with exception of mucoperiosteal thickening and opacification of the left sphenoid sinus, increased compared to the prior study. No acute displaced skull fractures are identified.  IMPRESSION: 1. No acute intracranial abnormalities. 2. Mild cerebral and cerebellar atrophy with extensive chronic microvascular ischemic changes in the cerebral white matter redemonstrated, as above. 3. Opacification and mucoperiosteal thickening in the left sphenoid sinus indicative of chronic sinusitis.   Electronically Signed   By: Vinnie Langton M.D.   On: 10/20/2013 16:35   IMPRESSION: 1. Increase in size and lobularity of the right middle lobe opacity that has been shown on multiple prior exams. This likely represents progressive atelectasis/scarring given the fact that there is more downward retraction of the minor fissure, rather than a new underlying mass, but warrants observation. Consider followup chest CT in 6 months time. 2. No embolus or dissection identified. 3.  Prominent lumbar spondylosis and scoliosis. 4. Scattered sigmoid colon diverticula. No active diverticulitis identified. 5. Mild rectal distention, likely incidental.     FINDINGS: CTA CHEST FINDINGS   No filling defect is identified in the pulmonary arterial tree to suggest pulmonary embolus. No aortic dissection or acute aortic findings. Right infrahilar node short axis 0.8 cm. Volume loss in the right middle lobe is associated with a bandlike 6.5 by 2.2 by 1.9 cm opacity which appears more prominent than lobular than on prior exams.   Several bulla are present in the right lung. Subsegmental atelectasis, left lower lobe.  EKG Interpretation   None      EKG Interpretation    Date/Time:  Wednesday October 20 2013 12:07:31 EST Ventricular Rate:  73 PR Interval:  146 QRS Duration: 92 QT Interval:  432 QTC Calculation: 475 R Axis:   18 Text Interpretation:  Normal sinus rhythm Cannot rule out Anterior infarct , age undetermined Abnormal ECG When compared with ECG of 02-Jan-2011 11:13, Premature ventricular complexes are no longer Present T wave amplitude has decreased in Anterior leads Confirmed by Dymphna Wadley MD, Timiko Offutt ([id number]) on 10/20/2013 2:23:35 PM             MDM  80 year old female with a history DVT presents today with a syncopal. She had some associated lower abdominal cramping. Workup here including head CT, abdominal CT, and CT and chest and out acute abnormalities. Plan the patient went Dr. Radene Ou and are on call physician for observation. Discussed with Dr. Anastasio Champion and plan observation for syncope.   Shaune Pollack, MD 10/20/13 [place], MD 10/20/13 [id number]

## 2013-10-20 NOTE — ED Notes (Signed)
Mini report given to Boneta Lucks, RN. Patient ready for admit, report given to floor RN, see prior documentation.

## 2013-10-20 NOTE — ED Notes (Signed)
Patient ambulatory to restroom steady gait. Urine specimen obtained. 

## 2013-10-20 NOTE — ED Notes (Signed)
Patient completed drinking contrast. CT aware.

## 2013-10-20 NOTE — ED Notes (Addendum)
Patient c/o lower abdominal cramping and nausea prior to LOC in parking lot PTA. Alert/oriented x 4

## 2013-10-21 DIAGNOSIS — I517 Cardiomegaly: Secondary | ICD-10-CM

## 2013-10-21 LAB — CBC
HCT: 36.8 % (ref 36.0–46.0)
Hemoglobin: 12.3 g/dL (ref 12.0–15.0)
MCH: 31.1 pg (ref 26.0–34.0)
MCHC: 33.4 g/dL (ref 30.0–36.0)
MCV: 92.9 fL (ref 78.0–100.0)
Platelets: 230 10*3/uL (ref 150–400)
RBC: 3.96 MIL/uL (ref 3.87–5.11)
RDW: 14 % (ref 11.5–15.5)
WBC: 6.6 10*3/uL (ref 4.0–10.5)

## 2013-10-21 LAB — COMPREHENSIVE METABOLIC PANEL
ALT: 17 U/L (ref 0–35)
AST: 22 U/L (ref 0–37)
Albumin: 3 g/dL — ABNORMAL LOW (ref 3.5–5.2)
Alkaline Phosphatase: 59 U/L (ref 39–117)
BUN: 16 mg/dL (ref 6–23)
CO2: 25 mEq/L (ref 19–32)
Calcium: 8.5 mg/dL (ref 8.4–10.5)
Chloride: 104 mEq/L (ref 96–112)
Creatinine, Ser: 0.75 mg/dL (ref 0.50–1.10)
GFR calc Af Amer: 88 mL/min — ABNORMAL LOW (ref >90)
GFR calc non Af Amer: 76 mL/min — ABNORMAL LOW (ref >90)
Glucose, Bld: 99 mg/dL (ref 70–99)
Potassium: 3.5 mEq/L — ABNORMAL LOW (ref 3.7–5.3)
Sodium: 140 mEq/L (ref 137–147)
Total Bilirubin: 0.7 mg/dL (ref 0.3–1.2)
Total Protein: 6.3 g/dL (ref 6.0–8.3)

## 2013-10-21 LAB — TROPONIN I
Troponin I: <0.3 ng/mL (ref <0.30)
Troponin I: <0.3 ng/mL (ref <0.30)

## 2013-10-21 LAB — PROTIME-INR
INR: 1.92 — ABNORMAL HIGH (ref 0.00–1.49)
Prothrombin Time: 21.4 seconds — ABNORMAL HIGH (ref 11.6–15.2)

## 2013-10-21 MED ORDER — WARFARIN - PHYSICIAN DOSING INPATIENT: Status: DC

## 2013-10-21 NOTE — Discharge Summary (Signed)
Physician Discharge Summary  Patient ID: Sara Garcia MRN: [id number] DOB/AGE: 18-Sep-1928 80 y.o. Primary Care Physician:BURDINE,STEVEN E, MD Admit date: 10/20/2013 Discharge date: 10/21/2013    Discharge Diagnoses:  1. Syncope, unclear etiology. 2. History of thromboembolic disease, on chronic anticoagulation with Coumadin. 3. Possible dementia.     Medication List         acetaminophen 500 MG tablet  Commonly known as:  TYLENOL  Take 1,000 mg by mouth daily.     DULoxetine 60 MG capsule  Commonly known as:  CYMBALTA  Take 60 mg by mouth daily.     esomeprazole 40 MG capsule  Commonly known as:  NEXIUM  Take 40 mg by mouth daily at 12 noon.     memantine 5 MG tablet  Commonly known as:  NAMENDA  Take 5 mg by mouth 2 (two) times daily.     pyridOXINE 100 MG tablet  Commonly known as:  VITAMIN B-6  Take 100 mg by mouth daily.     vitamin B-12 1000 MCG tablet  Commonly known as:  CYANOCOBALAMIN  Take 1,000 mcg by mouth daily.     warfarin 5 MG tablet  Commonly known as:  COUMADIN  Take 5 mg by mouth daily.        Discharged Condition: Stable.    Consults: None.  Significant Diagnostic Studies: Ct Head Wo Contrast  10/20/2013   CLINICAL DATA:  Syncope.  EXAM: CT HEAD WITHOUT CONTRAST  TECHNIQUE: Contiguous axial images were obtained from the base of the skull through the vertex without intravenous contrast.  COMPARISON:  Head CT 03/19/2013.  FINDINGS: Mild cerebral and cerebellar atrophy. Patchy and confluent areas of decreased attenuation are noted throughout the deep and periventricular white matter of the cerebral hemispheres bilaterally, compatible with chronic microvascular ischemic disease. No acute intracranial abnormalities. Specifically, no evidence of acute intracranial hemorrhage, no definite findings of acute/subacute cerebral ischemia, no mass, mass effect, hydrocephalus or abnormal intra or extra-axial fluid collections. Visualized paranasal sinuses and  mastoids are generally well pneumatized, with exception of mucoperiosteal thickening and opacification of the left sphenoid sinus, increased compared to the prior study. No acute displaced skull fractures are identified.  IMPRESSION: 1. No acute intracranial abnormalities. 2. Mild cerebral and cerebellar atrophy with extensive chronic microvascular ischemic changes in the cerebral white matter redemonstrated, as above. 3. Opacification and mucoperiosteal thickening in the left sphenoid sinus indicative of chronic sinusitis.   Electronically Signed   By: Vinnie Langton M.D.   On: 10/20/2013 16:35   Ct Angio Chest Pe W/cm &/or Wo Cm  10/20/2013   CLINICAL DATA:  Syncope. Left lower quadrant abdominal pain. Leg weakness. Sudden onset. History of deep vein thrombosis.  EXAM: CT ANGIOGRAPHY CHEST  CT ABDOMEN AND PELVIS WITH CONTRAST  TECHNIQUE: Multidetector CT imaging of the chest was performed using the standard protocol during bolus administration of intravenous contrast. Multiplanar CT image reconstructions including MIPs were obtained to evaluate the vascular anatomy. Multidetector CT imaging of the abdomen and pelvis was performed using the standard protocol during bolus administration of intravenous contrast.  CONTRAST:  65mL OMNIPAQUE IOHEXOL 300 MG/ML SOLN, 119mL OMNIPAQUE IOHEXOL 350 MG/ML SOLN  COMPARISON:  DG HIP COMPLETE 2+V BILAT dated 09/03/2010; DG CHEST 1V PORT dated 03/19/2013; CT ABD-PELV W/ CM dated 04/24/2010; CT ABD-PELV W/ CM dated 08/30/2008; CT ANGIO CHEST dated 01/16/2009  FINDINGS: CTA CHEST FINDINGS  No filling defect is identified in the pulmonary arterial tree to suggest pulmonary embolus. No aortic dissection  or acute aortic findings. Right infrahilar node short axis 0.8 cm. Volume loss in the right middle lobe is associated with a bandlike 6.5 by 2.2 by 1.9 cm opacity which appears more prominent than lobular than on prior exams.  Several bulla are present in the right lung. Subsegmental  atelectasis, left lower lobe.  CT ABDOMEN and PELVIS FINDINGS  The liver, spleen, pancreas, and adrenal glands appear unremarkable. Gallbladder contracted. No significant biliary dilatation observed.  Dextroconvex lumbar scoliosis with rotary component. Borderline fullness of the left collecting system without hydroureter or stone observed.  Urinary bladder unremarkable. Orally administered contrast extends through to the rectum. Scattered sigmoid colon diverticular present. Rectum is slightly distended.  Terminal ileum unremarkable. No pathologic upper abdominal adenopathy is observed. No pathologic pelvic adenopathy is observed.  There is moderate degenerative arthropathy of both hips. Prominent lumbar spondylosis.  Review of the MIP images confirms the above findings.  IMPRESSION: 1. Increase in size and lobularity of the right middle lobe opacity that has been shown on multiple prior exams. This likely represents progressive atelectasis/scarring given the fact that there is more downward retraction of the minor fissure, rather than a new underlying mass, but warrants observation. Consider followup chest CT in 6 months time. 2. No embolus or dissection identified. 3. Prominent lumbar spondylosis and scoliosis. 4. Scattered sigmoid colon diverticula. No active diverticulitis identified. 5. Mild rectal distention, likely incidental.   Electronically Signed   By: Sherryl Barters M.D.   On: 10/20/2013 17:04   Ct Abdomen Pelvis W Contrast  10/20/2013   CLINICAL DATA:  Syncope. Left lower quadrant abdominal pain. Leg weakness. Sudden onset. History of deep vein thrombosis.  EXAM: CT ANGIOGRAPHY CHEST  CT ABDOMEN AND PELVIS WITH CONTRAST  TECHNIQUE: Multidetector CT imaging of the chest was performed using the standard protocol during bolus administration of intravenous contrast. Multiplanar CT image reconstructions including MIPs were obtained to evaluate the vascular anatomy. Multidetector CT imaging of the abdomen  and pelvis was performed using the standard protocol during bolus administration of intravenous contrast.  CONTRAST:  54mL OMNIPAQUE IOHEXOL 300 MG/ML SOLN, 144mL OMNIPAQUE IOHEXOL 350 MG/ML SOLN  COMPARISON:  DG HIP COMPLETE 2+V BILAT dated 09/03/2010; DG CHEST 1V PORT dated 03/19/2013; CT ABD-PELV W/ CM dated 04/24/2010; CT ABD-PELV W/ CM dated 08/30/2008; CT ANGIO CHEST dated 01/16/2009  FINDINGS: CTA CHEST FINDINGS  No filling defect is identified in the pulmonary arterial tree to suggest pulmonary embolus. No aortic dissection or acute aortic findings. Right infrahilar node short axis 0.8 cm. Volume loss in the right middle lobe is associated with a bandlike 6.5 by 2.2 by 1.9 cm opacity which appears more prominent than lobular than on prior exams.  Several bulla are present in the right lung. Subsegmental atelectasis, left lower lobe.  CT ABDOMEN and PELVIS FINDINGS  The liver, spleen, pancreas, and adrenal glands appear unremarkable. Gallbladder contracted. No significant biliary dilatation observed.  Dextroconvex lumbar scoliosis with rotary component. Borderline fullness of the left collecting system without hydroureter or stone observed.  Urinary bladder unremarkable. Orally administered contrast extends through to the rectum. Scattered sigmoid colon diverticular present. Rectum is slightly distended.  Terminal ileum unremarkable. No pathologic upper abdominal adenopathy is observed. No pathologic pelvic adenopathy is observed.  There is moderate degenerative arthropathy of both hips. Prominent lumbar spondylosis.  Review of the MIP images confirms the above findings.  IMPRESSION: 1. Increase in size and lobularity of the right middle lobe opacity that has been shown on multiple prior  exams. This likely represents progressive atelectasis/scarring given the fact that there is more downward retraction of the minor fissure, rather than a new underlying mass, but warrants observation. Consider followup chest CT in  6 months time. 2. No embolus or dissection identified. 3. Prominent lumbar spondylosis and scoliosis. 4. Scattered sigmoid colon diverticula. No active diverticulitis identified. 5. Mild rectal distention, likely incidental.   Electronically Signed   By: Sherryl Barters M.D.   On: 10/20/2013 17:04    Lab Results: Basic Metabolic Panel:  Recent Labs  10/20/13 1229 10/21/13 0206  NA 141 140  K 4.1 3.5*  CL 103 104  CO2 27 25  GLUCOSE 113* 99  BUN 21 16  CREATININE 0.81 0.75  CALCIUM 9.0 8.5   Liver Function Tests:  Recent Labs  10/21/13 0206  AST 22  ALT 17  ALKPHOS 59  BILITOT 0.7  PROT 6.3  ALBUMIN 3.0*     CBC:  Recent Labs  10/20/13 1229 10/21/13 0206  WBC 6.9 6.6  NEUTROABS 4.5  --   HGB 13.4 12.3  HCT 40.7 36.8  MCV 93.6 92.9  PLT 218 230       Hospital Course: This very pleasant 80 year old lady came into the hospital after a syncopal episode. Please see initial history as outlined below: HPI: This very pleasant 80 year old lady presents with a syncopal episode. Earlier today, approximately 9 hours ago she started feeling unwell while driving her car for a doctor's appointment of her husband. She had lower abdominal pain which was cramping in nature and associated with a hot feeling throughout her body. This was also associated with some visual changes and inability to focus. When she arrived at the doctor's office, she got from her car and her legs gave way and then she lost consciousness. She thinks she lost consciousness for approximately 5 minutes. There was no witnessed tongue biting, shaking of her limbs or any change in color of her body. She regained consciousness and was completely orientated. She was taken to the emergency room for further evaluation. She does feel perfectly fine now and is back to her normal self. She denies any chest pain, palpitations or any limb weakness. She had a similar episode approximately 6 months ago. Investigations of this  at another hospital did not reveal any abnormalities. Since admission, she has been absolutely stable with no arrhythmias or any other syncopal episodes. Serial cardiac enzymes have been negative. She is still awaiting an echocardiogram and she will need to have this prior to discharge today. She will followup with me in the office in one week's time. She wishes to change primary care physicians and I've told her that she will need to request this from her current primary care physician. She will need followup in one week's time for review of the echocardiogram. Discharge Exam: Blood pressure 151/73, pulse 82, temperature 98 F (36.7 C), temperature source Oral, resp. rate 20, height 5\' 2"  (1.575 m), weight 61.5 kg (135 lb 9.3 oz), SpO2 94.00%. She looks systemically well. She is alert and oriented without any focal neurological signs. Heart sounds are present without any murmurs. There are no carotid bruits.  Disposition: Home.      Discharge Orders   Future Orders Complete By Expires   Diet - low sodium heart healthy  As directed    Increase activity slowly  As directed       Follow-up Information   Follow up with Doree Albee, MD. Schedule an appointment as soon  as possible for a visit in 1 week.   Specialty:  Internal Medicine   Contact information:   [address] [place] [address] [phone]       Signed: Doree Albee   10/21/2013, 8:08 AM

## 2013-10-21 NOTE — Progress Notes (Signed)
*  PRELIMINARY RESULTS* Echocardiogram 2D Echocardiogram has been performed.  [place], [place] 10/21/2013, 11:36 AM

## 2013-10-21 NOTE — Progress Notes (Signed)
Patient and family state understanding of discharge instructions 

## 2013-12-02 ENCOUNTER — Ambulatory Visit: Payer: Medicare HMO | Admitting: Cardiology

## 2013-12-10 LAB — PROTIME-INR: INR: 3 — AB (ref 0.9–1.1)

## 2013-12-15 ENCOUNTER — Ambulatory Visit (HOSPITAL_COMMUNITY)
Admission: RE | Admit: 2013-12-15 | Discharge: 2013-12-15 | Disposition: A | Discharge status: Home / Self Care | Payer: Medicare HMO | Source: Ambulatory Visit | Attending: Internal Medicine | Admitting: Internal Medicine

## 2013-12-15 ENCOUNTER — Other Ambulatory Visit (HOSPITAL_COMMUNITY): Payer: Self-pay | Admitting: Internal Medicine

## 2013-12-15 DIAGNOSIS — M79604 Pain in right leg: Secondary | ICD-10-CM

## 2013-12-15 DIAGNOSIS — M25561 Pain in right knee: Secondary | ICD-10-CM

## 2013-12-15 DIAGNOSIS — M1710 Unilateral primary osteoarthritis, unspecified knee: Secondary | ICD-10-CM | POA: Insufficient documentation

## 2013-12-15 DIAGNOSIS — W19XXXA Unspecified fall, initial encounter: Secondary | ICD-10-CM | POA: Insufficient documentation

## 2013-12-17 ENCOUNTER — Encounter: Payer: Self-pay | Admitting: *Deleted

## 2013-12-21 ENCOUNTER — Ambulatory Visit (INDEPENDENT_AMBULATORY_CARE_PROVIDER_SITE_OTHER): Payer: Medicare HMO | Admitting: Cardiology

## 2013-12-21 ENCOUNTER — Encounter: Payer: Self-pay | Admitting: Cardiology

## 2013-12-21 ENCOUNTER — Telehealth: Payer: Self-pay

## 2013-12-21 VITALS — BP 182/92 | HR 75 | Ht 62.0 in | Wt 140.0 lb

## 2013-12-21 DIAGNOSIS — R079 Chest pain, unspecified: Secondary | ICD-10-CM | POA: Insufficient documentation

## 2013-12-21 DIAGNOSIS — I4949 Other premature depolarization: Secondary | ICD-10-CM

## 2013-12-21 DIAGNOSIS — E785 Hyperlipidemia, unspecified: Secondary | ICD-10-CM

## 2013-12-21 DIAGNOSIS — I10 Essential (primary) hypertension: Secondary | ICD-10-CM | POA: Insufficient documentation

## 2013-12-21 DIAGNOSIS — I493 Ventricular premature depolarization: Secondary | ICD-10-CM

## 2013-12-21 NOTE — Telephone Encounter (Signed)
Follow up  ° ° ° °Returning call back to nurse  °

## 2013-12-21 NOTE — Patient Instructions (Addendum)
**Note De-Identified  Obfuscation** Your physician has requested that you have a lexiscan myoview. For further information please visit [email or web address]. Please follow instruction sheet, as given.  Your physician recommends that you return for lab work in: on same day as Evangeline has recommended that you wear a 24 hour holter monitor. Holter monitors are medical devices that record the heart's electrical activity. Doctors most often use these monitors to diagnose arrhythmias. Arrhythmias are problems with the speed or rhythm of the heartbeat. The monitor is a small, portable device. You can wear one while you do your normal daily activities. This is usually used to diagnose what is causing palpitations/syncope (passing out).  Your physician recommends that you continue on your current medications as directed. Please refer to the Current Medication list given to you today.  Your physician recommends that you schedule a follow-up appointment in: after tests

## 2013-12-21 NOTE — Telephone Encounter (Signed)
**Note De-Identified  Obfuscation** Pt advised, she verbalized understanding and agrees to have fasting labs drawn on same day as her Lexiscan on 01/03/14.

## 2013-12-21 NOTE — Progress Notes (Signed)
Patient ID: Adalberto Cole, female   DOB: Feb 27, 1928, 80 y.o.   MRN: [id number]    Patient Name: Sara Garcia Date of Encounter: 12/21/2013  Primary Care Provider:  Doree Albee, MD Primary Cardiologist:  Dorothy Spark  Problem List   Past Medical History  Diagnosis Date  . Arthritis   . Asthma   . Osteoporosis   . DVT (deep venous thrombosis)   . Dementia    Past Surgical History  Procedure Laterality Date  . Abdominal hysterectomy    . Nasal septum surgery    . Tonsillectomy    . Colonoscopy w/ biopsies      with snare polypectomy and biopsy  . Wrist fracture surgery     Allergies  Allergies  Allergen Reactions  . Adhesive [Tape]   . Ciprofloxacin     vomiting  . Codeine     Upsets stomach, nightmares  . Latex     Breaking out and itching  . Penicillins     hives  . Sulfa Antibiotics     hives   HPI  This very pleasant 79 year old lady had a syncopal episode in January. She was feeling unwell while driving her car when she had lower abdominal pain which was cramping in nature and associated with a hot feeling throughout her body. This was also associated with some visual changes and inability to focus. When she arrived at the doctor's office, she got from her car and her legs gave way and then she lost consciousness. She thinks she lost consciousness for approximately 5 minutes. There was no witnessed tongue biting, shaking of her limbs or any change in color of her body. She regained consciousness and was completely orientated. She was taken to the emergency room for further evaluation. Work up was negative. She had one similar episode in the past. She also admits retrosternal chest pains that can happen with or without exertion but most commonly on exertion. She also experiences them when she lays down. She has been contributing to his pain to her gastric reflux disease. These pains are not associated with palpitations. Or syncope. She is fairly active and only  feels minimal shortness of breath on exertion.  Home Medications  Prior to Admission medications   Medication Sig Start Date End Date Taking? Authorizing Provider  acetaminophen (TYLENOL) 500 MG tablet Take 1,000 mg by mouth daily.    Historical Provider, MD  DULoxetine (CYMBALTA) 60 MG capsule Take 60 mg by mouth daily.    Historical Provider, MD  esomeprazole (NEXIUM) 40 MG capsule Take 40 mg by mouth daily at 12 noon.    Historical Provider, MD  memantine (NAMENDA) 5 MG tablet Take 5 mg by mouth 2 (two) times daily.    Historical Provider, MD  pyridOXINE (VITAMIN B-6) 100 MG tablet Take 100 mg by mouth daily.    Historical Provider, MD  vitamin B-12 (CYANOCOBALAMIN) 1000 MCG tablet Take 1,000 mcg by mouth daily.    Historical Provider, MD  warfarin (COUMADIN) 5 MG tablet Take 5 mg by mouth daily.    Historical Provider, MD    Family History  No family history on file.  Social History  History   Social History  . Marital Status: Married    Spouse Name: N/A    Number of Children: N/A  . Years of Education: N/A   Occupational History  . Not on file.   Social History Main Topics  . Smoking status: Never Smoker   . Smokeless  tobacco: Not on file  . Alcohol Use: No  . Drug Use: No  . Sexual Activity: Not Currently    Birth Control/ Protection: None   Other Topics Concern  . Not on file   Social History Narrative  . No narrative on file     Review of Systems, as per HPI, otherwise negative General:  No chills, fever, night sweats or weight changes.  Cardiovascular:  No chest pain, dyspnea on exertion, edema, orthopnea, palpitations, paroxysmal nocturnal dyspnea. Dermatological: No rash, lesions/masses Respiratory: No cough, dyspnea Urologic: No hematuria, dysuria Abdominal:   No nausea, vomiting, diarrhea, bright red blood per rectum, melena, or hematemesis Neurologic:  No visual changes, wkns, changes in mental status. All other systems reviewed and are otherwise  negative except as noted above.  Physical Exam  BP 182/92, HR 75 General: Pleasant, NAD Psych: Normal affect. Neuro: Alert and oriented X 3. Moves all extremities spontaneously. HEENT: Normal  Neck: Supple without bruits or JVD. Lungs:  Resp regular and unlabored, CTA. Heart: RRR no s3, s4, or murmurs. Abdomen: Soft, non-tender, non-distended, BS + x 4.  Extremities: No clubbing, cyanosis or edema. DP/PT/Radials 2+ and equal bilaterally.  Labs:  No results found for this basename: CKTOTAL, CKMB, TROPONINI,  in the last 72 hours Lab Results  Component Value Date   WBC 6.6 10/21/2013   HGB 12.3 10/21/2013   HCT 36.8 10/21/2013   MCV 92.9 10/21/2013   PLT 230 10/21/2013   Accessory Clinical Findings  Echocardiogram - 10/21/2013  - Study data: Technically difficult study. - Left ventricle: The cavity size was normal. Wall thickness was increased in a pattern of mild LVH. There is mild basal septal hypertrophy without obstructive gradient. Systolic function was normal. The estimated ejection fraction was in the range of 60% to 65%. Doppler parameters are consistent with abnormal left ventricular relaxation (grade 1 diastolic dysfunction). - Aortic valve: Mildly calcified annulus. Trileaflet; mildly thickened leaflets. Trivial regurgitation. Valve area: 1.69cm^2(VTI). Valve area: 1.79cm^2 (Vmax). - Mitral valve: Mildly calcified annulus. Mildly thickened leaflets .  ECG - NSR, very frequent monomorphic PVCs   Assessment & Plan  A very pleasant 80 year old female  1. Chest pain - with some typical and some atypical features, considering her age and high blood pressure we will order a Lexiscan nuclear stress test. The reason for Carlton Adam is lower back pain and inability to walk long distances.  2. Syncope - the differential is broad however she has her frequent monomorphic PVCs on her EKG. We will order 24-hour Holter monitor to evaluate for frequency and other possible ventricular  tachycardias.  3. Hypertension - patient states that her blood pressure was always controlled and is hesitant about starting to take medication. Echocardiogram shows moderate LVH suggesting long-term hypertension. We'll evaluate her blood pressure during her stress test and if still elevated we'll start her on medication.  4. history of DVT - bilateral, 2 years ago on chronic Coumadin TURP he did she's compliant with.  5. Lipids  -We will check on the dose stress test  Follow - up in one month.  Dorothy Spark, MD, [facility] 12/21/2013, 8:28 AM

## 2013-12-21 NOTE — Addendum Note (Signed)
**Note De-Identified Ellison Leisure Obfuscation** Addended by: Dennie Fetters on: 12/21/2013 09:42 AM   Modules accepted: Orders

## 2013-12-21 NOTE — Telephone Encounter (Signed)
**Note De-Identified  Obfuscation** LMTCB.  Pt had OV with Dr Meda Coffee this morning. Per Dr Meda Coffee she needs to have Lipids and LFT's on same day as her Carlton Adam is done on.

## 2013-12-29 ENCOUNTER — Encounter (INDEPENDENT_AMBULATORY_CARE_PROVIDER_SITE_OTHER): Payer: Medicare HMO

## 2013-12-29 ENCOUNTER — Other Ambulatory Visit: Payer: Medicare HMO

## 2013-12-29 ENCOUNTER — Encounter: Payer: Self-pay | Admitting: *Deleted

## 2013-12-29 DIAGNOSIS — I493 Ventricular premature depolarization: Secondary | ICD-10-CM

## 2013-12-29 DIAGNOSIS — I4949 Other premature depolarization: Secondary | ICD-10-CM

## 2013-12-29 NOTE — Progress Notes (Signed)
Patient ID: Sara Garcia, female   DOB: 10-Jul-1928, 80 y.o.   MRN: [id number] E-Cardio 24 hour holter monitor applied to patient.

## 2014-01-02 ENCOUNTER — Encounter (HOSPITAL_COMMUNITY): Payer: Self-pay | Admitting: Emergency Medicine

## 2014-01-02 ENCOUNTER — Emergency Department (HOSPITAL_COMMUNITY)
Admission: EM | Admit: 2014-01-02 | Discharge: 2014-01-02 | Disposition: A | Discharge status: Home / Self Care | Payer: Medicare HMO | Attending: Emergency Medicine | Admitting: Emergency Medicine

## 2014-01-02 ENCOUNTER — Emergency Department (HOSPITAL_COMMUNITY): Payer: Medicare HMO

## 2014-01-02 DIAGNOSIS — M545 Low back pain, unspecified: Secondary | ICD-10-CM | POA: Insufficient documentation

## 2014-01-02 DIAGNOSIS — Z7901 Long term (current) use of anticoagulants: Secondary | ICD-10-CM | POA: Insufficient documentation

## 2014-01-02 DIAGNOSIS — F0390 Unspecified dementia without behavioral disturbance: Secondary | ICD-10-CM | POA: Insufficient documentation

## 2014-01-02 DIAGNOSIS — Z86718 Personal history of other venous thrombosis and embolism: Secondary | ICD-10-CM | POA: Insufficient documentation

## 2014-01-02 DIAGNOSIS — M810 Age-related osteoporosis without current pathological fracture: Secondary | ICD-10-CM | POA: Insufficient documentation

## 2014-01-02 DIAGNOSIS — M129 Arthropathy, unspecified: Secondary | ICD-10-CM | POA: Insufficient documentation

## 2014-01-02 DIAGNOSIS — IMO0002 Reserved for concepts with insufficient information to code with codable children: Secondary | ICD-10-CM | POA: Insufficient documentation

## 2014-01-02 DIAGNOSIS — M25559 Pain in unspecified hip: Secondary | ICD-10-CM

## 2014-01-02 DIAGNOSIS — Z87828 Personal history of other (healed) physical injury and trauma: Secondary | ICD-10-CM | POA: Insufficient documentation

## 2014-01-02 DIAGNOSIS — J45909 Unspecified asthma, uncomplicated: Secondary | ICD-10-CM | POA: Insufficient documentation

## 2014-01-02 DIAGNOSIS — Z91040 Latex allergy status: Secondary | ICD-10-CM | POA: Insufficient documentation

## 2014-01-02 DIAGNOSIS — Z880 Allergy status to penicillin: Secondary | ICD-10-CM | POA: Insufficient documentation

## 2014-01-02 DIAGNOSIS — Z79899 Other long term (current) drug therapy: Secondary | ICD-10-CM | POA: Insufficient documentation

## 2014-01-02 DIAGNOSIS — M25569 Pain in unspecified knee: Secondary | ICD-10-CM | POA: Insufficient documentation

## 2014-01-02 LAB — LIPID PANEL
Cholesterol: 183 mg/dL (ref 0–200)
HDL: 65 mg/dL
LDL Cholesterol: 93 mg/dL (ref 0–99)
Total CHOL/HDL Ratio: 2.8 ratio
Triglycerides: 123 mg/dL
VLDL: 25 mg/dL (ref 0–40)

## 2014-01-02 LAB — BASIC METABOLIC PANEL WITH GFR
BUN: 28 mg/dL — ABNORMAL HIGH (ref 6–23)
CO2: 27 meq/L (ref 19–32)
Calcium: 9.1 mg/dL (ref 8.4–10.5)
Chloride: 104 meq/L (ref 96–112)
Creatinine, Ser: 0.84 mg/dL (ref 0.50–1.10)
GFR calc Af Amer: 72 mL/min — ABNORMAL LOW
GFR calc non Af Amer: 63 mL/min — ABNORMAL LOW
Glucose, Bld: 91 mg/dL (ref 70–99)
Potassium: 4 meq/L (ref 3.7–5.3)
Sodium: 141 meq/L (ref 137–147)

## 2014-01-02 LAB — CBC WITH DIFFERENTIAL/PLATELET
Basophils Absolute: 0 10*3/uL (ref 0.0–0.1)
Basophils Relative: 1 % (ref 0–1)
Eosinophils Absolute: 0.3 10*3/uL (ref 0.0–0.7)
Eosinophils Relative: 5 % (ref 0–5)
HCT: 37.9 % (ref 36.0–46.0)
Hemoglobin: 12.5 g/dL (ref 12.0–15.0)
Lymphocytes Relative: 36 % (ref 12–46)
Lymphs Abs: 2.4 10*3/uL (ref 0.7–4.0)
MCH: 29.7 pg (ref 26.0–34.0)
MCHC: 33 g/dL (ref 30.0–36.0)
MCV: 90 fL (ref 78.0–100.0)
Monocytes Absolute: 0.5 10*3/uL (ref 0.1–1.0)
Monocytes Relative: 8 % (ref 3–12)
Neutro Abs: 3.5 10*3/uL (ref 1.7–7.7)
Neutrophils Relative %: 52 % (ref 43–77)
Platelets: 211 10*3/uL (ref 150–400)
RBC: 4.21 MIL/uL (ref 3.87–5.11)
RDW: 13.3 % (ref 11.5–15.5)
WBC: 6.8 10*3/uL (ref 4.0–10.5)

## 2014-01-02 LAB — HEPATIC FUNCTION PANEL
ALT: 16 U/L (ref 0–35)
AST: 24 U/L (ref 0–37)
Albumin: 3.3 g/dL — ABNORMAL LOW (ref 3.5–5.2)
Alkaline Phosphatase: 66 U/L (ref 39–117)
Bilirubin, Direct: <0.2 mg/dL (ref 0.0–0.3)
Total Bilirubin: 0.6 mg/dL (ref 0.3–1.2)
Total Protein: 6.9 g/dL (ref 6.0–8.3)

## 2014-01-02 MED ORDER — OXYCODONE-ACETAMINOPHEN 2.5-325 MG PO TABS
1.0000 | ORAL_TABLET | Freq: Four times a day (QID) | ORAL | Status: DC | PRN
Start: 2014-01-02 — End: 2014-01-10

## 2014-01-02 MED ORDER — HYDROMORPHONE HCL PF 1 MG/ML IJ SOLN
0.5000 mg | Freq: Once | INTRAMUSCULAR | Status: AC
  Administered 2014-01-02: 0.5 mg via INTRAVENOUS
  Filled 2014-01-02: qty 1

## 2014-01-02 MED ORDER — ONDANSETRON HCL 4 MG/2ML IJ SOLN
4.0000 mg | Freq: Once | INTRAMUSCULAR | Status: AC
  Administered 2014-01-02: 4 mg via INTRAVENOUS
  Filled 2014-01-02: qty 2

## 2014-01-02 MED ORDER — CELECOXIB 100 MG PO CAPS: 100.0000 mg | ORAL_CAPSULE | Freq: Two times a day (BID) | ORAL | Status: DC

## 2014-01-02 NOTE — Discharge Instructions (Signed)
Follow up with your md this week. °

## 2014-01-02 NOTE — ED Provider Notes (Signed)
CSN: [id number]     Arrival date & time 01/02/14  1416 History  This chart was scribed for Maudry Diego, MD by Marcha Dutton, ED Scribe. This patient was seen in room APA17/APA17 and the patient's care was started at 2:52 PM.    Chief Complaint  Patient presents with  . Hip Pain     Patient is a 80 y.o. female presenting with hip pain. The history is provided by the patient. No language interpreter was used.  Hip Pain This is a new problem. The current episode started 2 days ago. The problem occurs constantly. The problem has been gradually worsening. Pertinent negatives include no chest pain, no abdominal pain and no headaches. The symptoms are aggravated by walking, twisting and bending. Nothing relieves the symptoms. She has tried rest and acetaminophen for the symptoms.  Pt reports a h/o of osteoperosis and arthritis. She also reports that her last fall was in January.    Past Medical History  Diagnosis Date  . Arthritis   . Asthma   . Osteoporosis   . DVT (deep venous thrombosis)   . Dementia     Past Surgical History  Procedure Laterality Date  . Abdominal hysterectomy    . Nasal septum surgery    . Tonsillectomy    . Colonoscopy w/ biopsies      with snare polypectomy and biopsy  . Wrist fracture surgery      No family history on file. History  Substance Use Topics  . Smoking status: Never Smoker   . Smokeless tobacco: Not on file  . Alcohol Use: No    OB History   Grav Para Term Preterm Abortions TAB SAB Ect Mult Living                  Review of Systems  Constitutional: Negative for appetite change and fatigue.  HENT: Negative for congestion, ear discharge and sinus pressure.   Eyes: Negative for discharge.  Respiratory: Negative for cough.   Cardiovascular: Negative for chest pain.  Gastrointestinal: Negative for abdominal pain and diarrhea.  Genitourinary: Negative for frequency and hematuria.  Musculoskeletal: Positive for arthralgias  (right hip) and back pain (lumbar spine).  Skin: Negative for rash.  Neurological: Negative for seizures and headaches.  Psychiatric/Behavioral: Negative for hallucinations.      Allergies  Adhesive; Ciprofloxacin; Codeine; Latex; Penicillins; and Sulfa antibiotics  Home Medications   Current Outpatient Rx  Name  Route  Sig  Dispense  Refill  . acetaminophen (TYLENOL) 500 MG tablet   Oral   Take 1,000 mg by mouth daily.         . benzonatate (TESSALON) 100 MG capsule   Oral   Take by mouth 3 (three) times daily as needed for cough.         . calcium-vitamin D 250-100 MG-UNIT per tablet   Oral   Take 1 tablet by mouth 2 (two) times daily.         . DULoxetine (CYMBALTA) 60 MG capsule   Oral   Take 60 mg by mouth daily.         Marland Kitchen esomeprazole (NEXIUM) 40 MG capsule   Oral   Take 40 mg by mouth daily at 12 noon.         . Fluticasone-Salmeterol (ADVAIR) 250-50 MCG/DOSE AEPB   Inhalation   Inhale 1 puff into the lungs 2 (two) times daily.         Marland Kitchen lidocaine (LIDODERM) 5 %  Transdermal   Place 1 patch onto the skin daily. Remove & Discard patch within 12 hours or as directed by MD         . pyridOXINE (VITAMIN B-6) 100 MG tablet   Oral   Take 100 mg by mouth daily.         . vitamin B-12 (CYANOCOBALAMIN) 1000 MCG tablet   Oral   Take 1,000 mcg by mouth daily.         Marland Kitchen warfarin (COUMADIN) 5 MG tablet   Oral   Take 5 mg by mouth daily.           Triage Vitals: BP 152/83  Pulse 128  Temp(Src) 98.3 F (36.8 C)  Resp 20  Ht 5\' 2"  (1.575 m)  Wt 143 lb (64.864 kg)  BMI 26.15 kg/m2  SpO2 98%  Physical Exam  Nursing note and vitals reviewed. Constitutional: She is oriented to person, place, and time. She appears well-developed.  HENT:  Head: Normocephalic.  Eyes: Conjunctivae and EOM are normal. No scleral icterus.  Neck: Neck supple. No thyromegaly present.  Cardiovascular: Normal rate and regular rhythm.  Exam reveals no gallop and  no friction rub.   No murmur heard. Pulmonary/Chest: No stridor. She has no wheezes. She has no rales. She exhibits no tenderness.  Abdominal: She exhibits no distension. There is no tenderness. There is no rebound.  Musculoskeletal: Normal range of motion. She exhibits tenderness (Lumbar spine, right hip, and with SLR). She exhibits no edema.  Lymphadenopathy:    She has no cervical adenopathy.  Neurological: She is oriented to person, place, and time. She exhibits normal muscle tone. Coordination normal.  Skin: No rash noted. No erythema.  Psychiatric: She has a normal mood and affect. Her behavior is normal.    ED Course  Procedures (including critical care time)  DIAGNOSTIC STUDIES: Oxygen Saturation is 98%   COORDINATION OF CARE: 2:57 PM- Pt advised of plan for treatment and pt agrees.  Labs Review Labs Reviewed - No data to display Imaging Review No results found.   EKG Interpretation None      MDM   Final diagnoses:  None    The chart was scribed for me under my direct supervision.  I personally performed the history, physical, and medical decision making and all procedures in the evaluation of this patient.Maudry Diego, MD 01/02/14 [phone]

## 2014-01-02 NOTE — ED Notes (Signed)
Patient with no complaints at this time. Respirations even and unlabored. Skin warm/dry. Discharge instructions reviewed with patient at this time. Patient given opportunity to voice concerns/ask questions. IV removed per policy and band-aid applied to site. Patient discharged at this time and left Emergency Department with steady gait.  

## 2014-01-02 NOTE — ED Notes (Signed)
Pt c/o pain in r hip radiating down to r foot since Friday.  Denies injury.  Pt says hurts to raise her r leg up.

## 2014-01-03 ENCOUNTER — Ambulatory Visit (HOSPITAL_COMMUNITY): Payer: Medicare HMO | Attending: Cardiology | Admitting: Radiology

## 2014-01-03 ENCOUNTER — Encounter: Payer: Self-pay | Admitting: Cardiology

## 2014-01-03 ENCOUNTER — Other Ambulatory Visit: Payer: Commercial Managed Care - HMO

## 2014-01-03 VITALS — BP 166/105 | HR 84 | Ht 62.0 in | Wt 142.0 lb

## 2014-01-03 DIAGNOSIS — R079 Chest pain, unspecified: Secondary | ICD-10-CM | POA: Diagnosis not present

## 2014-01-03 DIAGNOSIS — R55 Syncope and collapse: Secondary | ICD-10-CM | POA: Diagnosis not present

## 2014-01-03 MED ORDER — REGADENOSON 0.4 MG/5ML IV SOLN
0.4000 mg | Freq: Once | INTRAVENOUS | Status: AC
  Administered 2014-01-03: 0.4 mg via INTRAVENOUS

## 2014-01-03 MED ORDER — TECHNETIUM TC 99M SESTAMIBI GENERIC - CARDIOLITE
10.8000 | Freq: Once | INTRAVENOUS | Status: AC | PRN
  Administered 2014-01-03: 11 via INTRAVENOUS

## 2014-01-03 MED ORDER — TECHNETIUM TC 99M SESTAMIBI GENERIC - CARDIOLITE
33.0000 | Freq: Once | INTRAVENOUS | Status: AC | PRN
  Administered 2014-01-03: 33 via INTRAVENOUS

## 2014-01-03 NOTE — Progress Notes (Signed)
\   [facility] [address] [place], [place] [address] [phone]    Cardiology Nuclear Med Study  Sara Garcia is a 80 y.o. female     MRN : [id number]     DOB: 1928/10/09  Procedure Date: 01/03/2014  Nuclear Med Background Indication for Stress Test:  Evaluation for Ischemia History:  No known CAD, Echo 2015 EF 60-65%, Asthma, hx. DVT Cardiac Risk Factors: Hypertension and Lipids  Symptoms:  Chest Pain and Syncope   Nuclear Pre-Procedure Caffeine/Decaff Intake:  None NPO After: 10:00pm   Lungs:  clear O2 Sat: 94% on room air. IV 0.9% NS with Angio Cath:  22g  IV Site: R Hand  IV Started by:  Matilde Haymaker, RN  Chest Size (in):  38 Cup Size: C  Height: 5\' 2"  (1.575 m)  Weight:  142 lb (64.411 kg)  BMI:  Body mass index is 25.97 kg/(m^2). Tech Comments:  n/a    Nuclear Med Study 1 or 2 day study: 1 day  Stress Test Type:  Lexiscan  Reading MD: n/a  Order Authorizing Provider:  Filiberto Pinks  Resting Radionuclide: Technetium 14m Sestamibi  Resting Radionuclide Dose: 11.0 mCi   Stress Radionuclide:  Technetium 61m Sestamibi  Stress Radionuclide Dose: 33.0 mCi           Stress Protocol Rest HR: 84 Stress HR: 111  Rest BP: 166/105 Stress BP: 143/77  Exercise Time (min): n/a METS: n/a           Dose of Adenosine (mg):  n/a Dose of Lexiscan: 0.4 mg  Dose of Atropine (mg): n/a Dose of Dobutamine: n/a mcg/kg/min (at max HR)  Stress Test Technologist: Glade Lloyd, BS-ES  Nuclear Technologist:  Charlton Amor, CNMT     Rest Procedure:  Myocardial perfusion imaging was performed at rest 45 minutes following the intravenous administration of Technetium 69m Sestamibi. Rest ECG: NSR - Normal EKG  Stress Procedure:  The patient received IV Lexiscan 0.4 mg over 15-seconds.  Technetium 35m Sestamibi injected at 30-seconds.  Quantitative spect images were obtained after a 45 minute delay.  During the infusion of Lexiscan, the patient  complained of being nauseated and heaviness in her chest.  These symptoms began to ease in recovery.  Stress ECG: Occasional PVC;s   QPS Raw Data Images:  Normal; no motion artifact; normal heart/lung ratio. Stress Images:  Normal homogeneous uptake in all areas of the myocardium. Rest Images:  Normal homogeneous uptake in all areas of the myocardium. Subtraction (SDS):  Normal Transient Ischemic Dilatation (Normal <1.22):  1.15 Lung/Heart Ratio (Normal <0.45):  0.36  Quantitative Gated Spect Images QGS EDV:  50 ml QGS ESV:  8 ml  Impression Exercise Capacity:  Lexiscan with no exercise. BP Response:  Normal blood pressure response. Clinical Symptoms:  Nausea ECG Impression:  No significant ST segment change suggestive of ischemia. Comparison with Prior Nuclear Study: No previous nuclear study performed  Overall Impression:  Normal stress nuclear study.  LV Ejection Fraction: 85%.  LV Wall Motion:  NL LV Function; NL Wall Motion  Jenkins Rouge

## 2014-01-10 ENCOUNTER — Encounter: Payer: Self-pay | Admitting: Cardiology

## 2014-01-10 ENCOUNTER — Ambulatory Visit (INDEPENDENT_AMBULATORY_CARE_PROVIDER_SITE_OTHER): Payer: Commercial Managed Care - HMO | Admitting: Cardiology

## 2014-01-10 VITALS — BP 124/88 | HR 80 | Ht 62.0 in | Wt 138.0 lb

## 2014-01-10 DIAGNOSIS — I4891 Unspecified atrial fibrillation: Secondary | ICD-10-CM

## 2014-01-10 DIAGNOSIS — M5410 Radiculopathy, site unspecified: Secondary | ICD-10-CM

## 2014-01-10 DIAGNOSIS — IMO0002 Reserved for concepts with insufficient information to code with codable children: Secondary | ICD-10-CM

## 2014-01-10 MED ORDER — METOPROLOL TARTRATE 25 MG PO TABS: 25.0000 mg | ORAL_TABLET | Freq: Two times a day (BID) | ORAL | Status: DC

## 2014-01-10 NOTE — Progress Notes (Signed)
Patient ID: Sara Garcia, female   DOB: April 16, 1928, 80 y.o.   MRN: [id number]    Patient Name: Sara Garcia Date of Encounter: 01/10/2014  Primary Care Provider:  Doree Albee, MD Primary Cardiologist:  Dorothy Spark  Problem List   Past Medical History  Diagnosis Date  . Arthritis   . Asthma   . Osteoporosis   . DVT (deep venous thrombosis)   . Dementia    Past Surgical History  Procedure Laterality Date  . Abdominal hysterectomy    . Nasal septum surgery    . Tonsillectomy    . Colonoscopy w/ biopsies      with snare polypectomy and biopsy  . Wrist fracture surgery     Allergies  Allergies  Allergen Reactions  . Latex Itching and Rash  . Adhesive [Tape] Other (See Comments)    Tears skin  . Ciprofloxacin Nausea And Vomiting  . Codeine Nausea And Vomiting    Upsets stomach, nightmares  . Penicillins Hives  . Sulfa Antibiotics Hives   HPI  This very pleasant 80 year old lady had a syncopal episode in January. She was feeling unwell while driving her car when she had lower abdominal pain which was cramping in nature and associated with a hot feeling throughout her body. This was also associated with some visual changes and inability to focus. When she arrived at the doctor's office, she got from her car and her legs gave way and then she lost consciousness. She thinks she lost consciousness for approximately 5 minutes. There was no witnessed tongue biting, shaking of her limbs or any change in color of her body. She regained consciousness and was completely orientated. She was taken to the emergency room for further evaluation. Work up was negative. She had one similar episode in the past. She also admits retrosternal chest pains that can happen with or without exertion but most commonly on exertion. She also experiences them when she lays down. She has been contributing to his pain to her gastric reflux disease. These pains are not associated with palpitations. Or  syncope. She is fairly active and only feels minimal shortness of breath on exertion.  She is coming after 3 weeks, complaining of right lower extremity pain with weakness that is intermittent. No palpitations or anymore syncopal episodes.   Home Medications  Prior to Admission medications   Medication Sig Start Date End Date Taking? Authorizing Provider  acetaminophen (TYLENOL) 500 MG tablet Take 1,000 mg by mouth daily.    Historical Provider, MD  DULoxetine (CYMBALTA) 60 MG capsule Take 60 mg by mouth daily.    Historical Provider, MD  esomeprazole (NEXIUM) 40 MG capsule Take 40 mg by mouth daily at 12 noon.    Historical Provider, MD  memantine (NAMENDA) 5 MG tablet Take 5 mg by mouth 2 (two) times daily.    Historical Provider, MD  pyridOXINE (VITAMIN B-6) 100 MG tablet Take 100 mg by mouth daily.    Historical Provider, MD  vitamin B-12 (CYANOCOBALAMIN) 1000 MCG tablet Take 1,000 mcg by mouth daily.    Historical Provider, MD  warfarin (COUMADIN) 5 MG tablet Take 5 mg by mouth daily.    Historical Provider, MD    Family History  No family history on file.  Social History  History   Social History  . Marital Status: Married    Spouse Name: N/A    Number of Children: N/A  . Years of Education: N/A   Occupational History  .  Not on file.   Social History Main Topics  . Smoking status: Never Smoker   . Smokeless tobacco: Not on file  . Alcohol Use: No  . Drug Use: No  . Sexual Activity: Not Currently    Birth Control/ Protection: None   Other Topics Concern  . Not on file   Social History Narrative  . No narrative on file     Review of Systems, as per HPI, otherwise negative General:  No chills, fever, night sweats or weight changes.  Cardiovascular:  No chest pain, dyspnea on exertion, edema, orthopnea, palpitations, paroxysmal nocturnal dyspnea. Dermatological: No rash, lesions/masses Respiratory: No cough, dyspnea Urologic: No hematuria, dysuria Abdominal:    No nausea, vomiting, diarrhea, bright red blood per rectum, melena, or hematemesis Neurologic:  No visual changes, wkns, changes in mental status. All other systems reviewed and are otherwise negative except as noted above.  Physical Exam  BP 182/92, HR 75 General: Pleasant, NAD Psych: Normal affect. Neuro: Alert and oriented X 3. Moves all extremities spontaneously. HEENT: Normal  Neck: Supple without bruits or JVD. Lungs:  Resp regular and unlabored, CTA. Heart: RRR no s3, s4, or murmurs. Abdomen: Soft, non-tender, non-distended, BS + x 4.  Extremities: No clubbing, cyanosis or edema. DP/PT/Radials 2+ and equal bilaterally.  Labs:  No results found for this basename: CKTOTAL, CKMB, TROPONINI,  in the last 72 hours Lab Results  Component Value Date   WBC 6.8 01/02/2014   HGB 12.5 01/02/2014   HCT 37.9 01/02/2014   MCV 90.0 01/02/2014   PLT 211 01/02/2014   Accessory Clinical Findings  Echocardiogram - 10/21/2013  - Study data: Technically difficult study. - Left ventricle: The cavity size was normal. Wall thickness was increased in a pattern of mild LVH. There is mild basal septal hypertrophy without obstructive gradient. Systolic function was normal. The estimated ejection fraction was in the range of 60% to 65%. Doppler parameters are consistent with abnormal left ventricular relaxation (grade 1 diastolic dysfunction). - Aortic valve: Mildly calcified annulus. Trileaflet; mildly thickened leaflets. Trivial regurgitation. Valve area: 1.69cm^2(VTI). Valve area: 1.79cm^2 (Vmax). - Mitral valve: Mildly calcified annulus. Mildly thickened leaflets . - Left atrium: normal size  ECG - NSR, very frequent monomorphic PVCs  Lexiscan nuclear stress test: 01/04/2014 Impression  Exercise Capacity: Lexiscan with no exercise.  BP Response: Normal blood pressure response.  Clinical Symptoms: Nausea  ECG Impression: No significant ST segment change suggestive of ischemia.  Comparison  with Prior Nuclear Study: No previous nuclear study performed  Overall Impression: Normal stress nuclear study.  LV Ejection Fraction: 85%. LV Wall Motion: NL LV Function; NL Wall Motion   Assessment & Plan  A very pleasant 80 year old female  1. Chest pain - with some typical and some atypical features, considering her age and high blood pressure we will ordered a Lexiscan nuclear stress test, that was negative for scar or ischemia.   2. Syncope -  24-hour Holter monitor showed frequent very short episodes of a-fib with one episode lasting for almost 3 minutes.   3. A-fib -  Paroxysmal with RVR, we will start metoprolol 25 mg po BID, she is on chronic Coumadine,   4. Hypertension - starting metoprolol 25 mg po bid  5. history of DVT - bilateral, 2 years ago on chronic Coumadin that she is compliant with.  6. Lipids  -at goal   Follow - up in 6 months.Dorothy Spark, MD, [facility] 01/10/2014, 10:57 AM

## 2014-01-10 NOTE — Patient Instructions (Signed)
**Note De-Identified  Obfuscation** Your physician has recommended you make the following change in your medication: start taking Metoprolol 25 mg twice daily  Your physician wants you to follow-up in: 6 months. You will receive a reminder letter in the mail two months in advance. If you don't receive a letter, please call our office to schedule the follow-up appointment.  Dr Meda Coffee is referring you to a neurosurgeon for Radiculopathy

## 2014-01-12 NOTE — Progress Notes (Signed)
Quick Note:  Patient notified of Myocardial Perfusion Imaging scan results. Patient verbalized understanding. She has no questions or concerns. ______

## 2014-04-05 ENCOUNTER — Encounter: Payer: Self-pay | Admitting: *Deleted

## 2014-04-07 ENCOUNTER — Encounter: Payer: Self-pay | Admitting: Obstetrics & Gynecology

## 2014-04-07 ENCOUNTER — Ambulatory Visit (INDEPENDENT_AMBULATORY_CARE_PROVIDER_SITE_OTHER): Payer: Commercial Managed Care - HMO | Admitting: Obstetrics & Gynecology

## 2014-04-07 VITALS — BP 140/70 | Ht 62.0 in | Wt 132.0 lb

## 2014-04-07 DIAGNOSIS — N993 Prolapse of vaginal vault after hysterectomy: Secondary | ICD-10-CM

## 2014-04-07 NOTE — Progress Notes (Signed)
   Subjective:    Patient ID: Sara Garcia, female    DOB: 10-01-1928, 80 y.o.   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

## 2014-04-14 ENCOUNTER — Ambulatory Visit (INDEPENDENT_AMBULATORY_CARE_PROVIDER_SITE_OTHER): Payer: Commercial Managed Care - HMO | Admitting: Obstetrics & Gynecology

## 2014-04-14 ENCOUNTER — Encounter: Payer: Self-pay | Admitting: Obstetrics & Gynecology

## 2014-04-14 VITALS — BP 160/80 | Wt 135.0 lb

## 2014-04-14 DIAGNOSIS — N993 Prolapse of vaginal vault after hysterectomy: Secondary | ICD-10-CM

## 2014-04-14 NOTE — Progress Notes (Signed)
Patient ID: Sara Garcia, female   DOB: 05/25/1928, 80 y.o.   MRN: [id number]   Pessary placed without difficulty Follow up 1 month  Subjective:    Patient ID: Sara Garcia, female    DOB: 1928/01/03, 80 y.o.   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

## 2014-05-04 ENCOUNTER — Encounter: Payer: Self-pay | Admitting: Cardiovascular Disease

## 2014-05-04 ENCOUNTER — Ambulatory Visit (INDEPENDENT_AMBULATORY_CARE_PROVIDER_SITE_OTHER): Payer: Commercial Managed Care - HMO | Admitting: Cardiovascular Disease

## 2014-05-04 ENCOUNTER — Ambulatory Visit: Payer: Commercial Managed Care - HMO | Admitting: Cardiovascular Disease

## 2014-05-04 VITALS — BP 100/72 | HR 110 | Ht 62.0 in | Wt 128.0 lb

## 2014-05-04 DIAGNOSIS — R55 Syncope and collapse: Secondary | ICD-10-CM

## 2014-05-04 DIAGNOSIS — I4891 Unspecified atrial fibrillation: Secondary | ICD-10-CM

## 2014-05-04 DIAGNOSIS — I480 Paroxysmal atrial fibrillation: Secondary | ICD-10-CM

## 2014-05-04 DIAGNOSIS — R079 Chest pain, unspecified: Secondary | ICD-10-CM

## 2014-05-04 NOTE — Patient Instructions (Signed)
Your physician recommends that you schedule a follow-up appointment in: 3 months  Your physician recommends that you continue on your current medications as directed. Please refer to the Current Medication list given to you today.  Thank you for choosing [facility]!!    

## 2014-05-04 NOTE — Assessment & Plan Note (Signed)
No cardiac etiology found Non recurrent observe

## 2014-05-04 NOTE — Assessment & Plan Note (Signed)
PAF  Continue coumadin and beta blocker no evidence of excessive bradycardia that would need pacing

## 2014-05-04 NOTE — Progress Notes (Signed)
Patient ID: Sara Garcia, female   DOB: [birth year], 80 y.o.   MRN: [id number] This very pleasant 80 year old lady seen by Dr Meda Coffee in March  had a syncopal episode in January. She was feeling unwell while driving her car when she had lower abdominal pain which was cramping in nature and associated with a hot feeling throughout her body. This was also associated with some visual changes and inability to focus. When she arrived at the doctor's office, she got from her car and her legs gave way and then she lost consciousness. She thinks she lost consciousness for approximately 5 minutes. There was no witnessed tongue biting, shaking of her limbs or any change in color of her body. She regained consciousness and was completely orientated. She was taken to the emergency room for further evaluation. Work up was negative. She had one similar episode in the past.  She also admits retrosternal chest pains that can happen with or without exertion but most commonly on exertion. She also experiences them when she lays down. She has been contributing to his pain to her gastric reflux disease. These pains are not associated with palpitations. Or syncope. She is fairly active and only feels minimal shortness of breath on exertion.  Dr Meda Coffee ordered tests that looked fine  Stress myovue 12/21/13  Normal EF and no ischemia Echo 10/21/13 AV sclerosis normal EF  Study Conclusions  - Study data: Technically difficult study. - Left ventricle: The cavity size was normal. Wall thickness was increased in a pattern of mild LVH. There is mild basal septal hypertrophy without obstructive gradient. Systolic function was normal. The estimated ejection fraction was in the range of 60% to 65%. Doppler parameters are consistent with abnormal left ventricular relaxation (grade 1 diastolic dysfunction). - Aortic valve: Mildly calcified annulus. Trileaflet; mildly thickened leaflets. Trivial regurgitation. Valve area: 1.69cm^2(VTI).  Valve area: 1.79cm^2 (Vmax). - Mitral valve: Mildly calcified annulus. Mildly thickened leaflets .  Holter with PVC;s and two short episodes of PAF not related to symptoms      ROS: Denies fever, malais, weight loss, blurry vision, decreased visual acuity, cough, sputum, SOB, hemoptysis, pleuritic pain, palpitaitons, heartburn, abdominal pain, melena, lower extremity edema, claudication, or rash.  All other systems reviewed and negative  General: Affect appropriate Frail elderly female  HEENT: normal Neck supple with no adenopathy JVP normal no bruits no thyromegaly Lungs clear with no wheezing and good diaphragmatic motion Heart:  S1/S2 no murmur, no rub, gallop or click PMI normal Abdomen: benighn, BS positve, no tenderness, no AAA no bruit.  No HSM or HJR Distal pulses intact with no bruits No edema Neuro non-focal Skin warm and dry No muscular weakness   Current Outpatient Prescriptions  Medication Sig Dispense Refill  . acetaminophen (TYLENOL) 500 MG tablet Take 1,000 mg by mouth 2 (two) times daily as needed for mild pain, moderate pain or headache.       . bisacodyl (BISACODYL) 5 MG EC tablet Take 5 mg by mouth daily as needed for moderate constipation.      . Blood Pressure Monitor MISC as directed. Digital Blood Pressure Monitor      . Calcium Carbonate-Vitamin D (CALTRATE 600+D PO) Take 1 tablet by mouth daily.      . Cholecalciferol (D3-1000 PO) Take by mouth daily.      . DULoxetine (CYMBALTA) 60 MG capsule Take 60 mg by mouth daily.       Marland Kitchen esomeprazole (NEXIUM) 40 MG capsule Take 40 mg by  mouth daily at 12 noon.      . Fluticasone-Salmeterol (ADVAIR) 250-50 MCG/DOSE AEPB Inhale 1 puff into the lungs 2 (two) times daily.      Marland Kitchen levofloxacin (LEVAQUIN) 500 MG tablet Take 500 mg by mouth daily.      Marland Kitchen lidocaine (LIDODERM) 5 % Place 1 patch onto the skin daily. Remove & Discard patch within 12 hours or as directed by MD      . metoprolol tartrate (LOPRESSOR) 25 MG  tablet Take 1 tablet (25 mg total) by mouth 2 (two) times daily.  60 tablet  6  . pyridOXINE (VITAMIN B-6) 100 MG tablet Take 100 mg by mouth daily.      . sodium chloride (OCEAN) 0.65 % SOLN nasal spray Place 1 spray into both nostrils as needed for congestion.      . vitamin B-12 (CYANOCOBALAMIN) 1000 MCG tablet Take 1,000 mcg by mouth daily.      Marland Kitchen warfarin (COUMADIN) 5 MG tablet Take 5 mg by mouth every evening. Takes 6pm BRAND NAME ONLY       No current facility-administered medications for this visit.    Allergies  Latex; Adhesive; Ciprofloxacin; Codeine; Penicillins; and Sulfa antibiotics  Electrocardiogram:  SR PVCls    Assessment and Plan

## 2014-05-04 NOTE — Assessment & Plan Note (Signed)
Atypical musculoskeletal Normal myovue  Observe

## 2014-05-06 ENCOUNTER — Telehealth: Payer: Self-pay | Admitting: Obstetrics & Gynecology

## 2014-05-06 NOTE — Telephone Encounter (Signed)
Spoke with pt. Pt saw Dr. Elonda Husky on 04/14/14 with a protruding bladder. Pt has a pessary in place now. Pt is having a vaginal discharge now. Has a scheduled appt on Thursday, 7/30. I offered an earlier appt, but pt wants to wait until Thursday. [place]

## 2014-05-09 ENCOUNTER — Encounter: Payer: Self-pay | Admitting: Orthopedic Surgery

## 2014-05-09 ENCOUNTER — Ambulatory Visit (INDEPENDENT_AMBULATORY_CARE_PROVIDER_SITE_OTHER): Payer: Commercial Managed Care - HMO | Admitting: Orthopedic Surgery

## 2014-05-09 VITALS — BP 116/72 | Ht 62.0 in | Wt 128.0 lb

## 2014-05-09 DIAGNOSIS — M5137 Other intervertebral disc degeneration, lumbosacral region: Secondary | ICD-10-CM | POA: Insufficient documentation

## 2014-05-09 DIAGNOSIS — M51379 Other intervertebral disc degeneration, lumbosacral region without mention of lumbar back pain or lower extremity pain: Secondary | ICD-10-CM

## 2014-05-09 DIAGNOSIS — M4810 Ankylosing hyperostosis [Forestier], site unspecified: Secondary | ICD-10-CM | POA: Insufficient documentation

## 2014-05-09 DIAGNOSIS — M1711 Unilateral primary osteoarthritis, right knee: Secondary | ICD-10-CM | POA: Insufficient documentation

## 2014-05-09 DIAGNOSIS — M1710 Unilateral primary osteoarthritis, unspecified knee: Secondary | ICD-10-CM

## 2014-05-09 NOTE — Progress Notes (Signed)
Subjective:     Patient ID: Sara Garcia, female   DOB: Apr 03, 1928, 80 y.o.   MRN: [id number]  Knee Pain    Chief Complaint  Patient presents with  . Knee Pain    right knee pain for several years    HISTORY: 80 year old female presents with a long history of discomfort in the front of her right knee. She's taken some Tylenol extra strength and not had any other treatment other than maybe some Aspercreme which she uses on an intermittent basis.  She had a recent syncopal episode or at least is being worked up for one. She presents with anterior knee pain weakness in her lower extremities   Review of Systems The patient's review of systems has been completed and reviewed please see for details it is located in the scanned documents of the Epic system     Objective:   Physical Exam   Vital signs BP 116/72  Ht 5\' 2"  (1.575 m)  Wt 128 lb (58.06 kg)  BMI 23.41 kg/m2   General appearance: the patient is well-developed and well-nourished, grooming and hygiene are normal, body habitus normal  The patient is alert and oriented x 3; mood and affect are normal  Ambulatory status cane supported occasionally  Right knee/Left knee Inspection left knee reveals full range of motion no tenderness stable ligaments normal muscle tone and grade 5 motor strength with normal skin  Right knee inspection reveals tenderness over the medial joint line Range of motion knee flexion is normal The Lachman test is normal the anterior and posterior drawer tests are normal and the collateral ligaments are stable Motor exam 5/5 Skin normal; no rash or laceration  McMurray's sign negative  Cardiovascular exam normal pulse and perfusion without edema tenderness or varicose veins  Sensory exam is normal  A/P X-rays were done at the hospital she has a fairly normal knee with osteopenia mild arthritis. She is a severely scoliotic degenerative lumbar spine which may be the source of her giving way  symptoms    Assessment:     Osteoarthritis right knee mild Lumbar disc disease with possible dish syndrome      Plan:     I did inject the knee I prescribed her a topical arthritis medication and a knee brace. She's not a surgical candidate at this point because of her general health.  If her legs continue to give way recommend neurosurgical consult   Injection RIGHT knee Verbal consent and timeout were completed for a RIGHT injection Under sterile conditions the RIGHT knee was injected from a lateral approach.  Medication depomedrol 40 mg and Lidocaine plain 3 cc   A sterile dressing was applied there were no complications

## 2014-05-12 ENCOUNTER — Encounter: Payer: Self-pay | Admitting: Obstetrics & Gynecology

## 2014-05-12 ENCOUNTER — Ambulatory Visit (INDEPENDENT_AMBULATORY_CARE_PROVIDER_SITE_OTHER): Payer: Commercial Managed Care - HMO | Admitting: Obstetrics & Gynecology

## 2014-05-12 VITALS — BP 118/80 | Wt 136.0 lb

## 2014-05-12 DIAGNOSIS — N993 Prolapse of vaginal vault after hysterectomy: Secondary | ICD-10-CM

## 2014-05-12 MED ORDER — SOLIFENACIN SUCCINATE 10 MG PO TABS: 10.0000 mg | ORAL_TABLET | Freq: Every day | ORAL | Status: DC

## 2014-05-12 NOTE — Progress Notes (Signed)
Patient ID: [name], female   DOB: [birth year], 80 y.o.   MRN: [id number]      JALAINA SALYERS presents today for routine follow up related to her pessary.   She uses a Milex ring with support #4 She reports little vaginal discharge or vaginal bleeding.  Exam reveals no undue vaginal mucosal pressure of breakdown, little discharge and little vaginal bleeding.  The pessary is removed, cleaned and replaced without difficulty.    Kip L Emery will be sen back in 3 months for continued follow up.  Kimyata Milich H 05/12/2014 2:18 PM

## 2014-05-17 ENCOUNTER — Telehealth: Payer: Self-pay

## 2014-05-24 NOTE — Telephone Encounter (Signed)
Pt was referred for colonoscopy. She is on coumadin. Not sure she wants to have colonoscopy at her age, and not having any rectal bleeding and not anemic.  She does feel that she might need an EGD due to some difficulty with her swallowing.  Pt is scheduled OV with Neil Crouch, PA on 06/27/2014 at 11:30 PM.

## 2014-05-30 ENCOUNTER — Ambulatory Visit: Payer: Commercial Managed Care - HMO | Admitting: Obstetrics & Gynecology

## 2014-05-31 ENCOUNTER — Encounter: Payer: Self-pay | Admitting: Obstetrics & Gynecology

## 2014-05-31 ENCOUNTER — Ambulatory Visit (INDEPENDENT_AMBULATORY_CARE_PROVIDER_SITE_OTHER): Payer: Commercial Managed Care - HMO | Admitting: Obstetrics & Gynecology

## 2014-05-31 VITALS — BP 140/80 | Wt 139.0 lb

## 2014-05-31 DIAGNOSIS — N993 Prolapse of vaginal vault after hysterectomy: Secondary | ICD-10-CM

## 2014-05-31 MED ORDER — MIRABEGRON ER 50 MG PO TB24: 50.0000 mg | ORAL_TABLET | Freq: Every day | ORAL | Status: DC

## 2014-05-31 NOTE — Progress Notes (Signed)
Patient ID: Sara Garcia, female   DOB: 08-Apr-1928, 80 y.o.   MRN: [id number] Pt with some discharge and pinkish discharge Had her pessary in for 3 weeks   Exam Consistent with pessary being in place No vaginal breakdown but will try a smaller size, Milex ring with support #3  Try myrbetriq, vesicare was 400 dollars  Follow up in 2 weeks

## 2014-06-01 ENCOUNTER — Telehealth: Payer: Self-pay | Admitting: Obstetrics & Gynecology

## 2014-06-01 MED ORDER — OXYBUTYNIN CHLORIDE ER 5 MG PO TB24: 5.0000 mg | ORAL_TABLET | Freq: Every day | ORAL | Status: DC

## 2014-06-01 NOTE — Telephone Encounter (Signed)
Pt states saw Dr. Elonda Husky yesterday was given a Rx for Myrbetriq 50 mg, pt states cost is $105.00, Vesicare was $95.00. Is there an alternative medication that is less expensive?

## 2014-06-01 NOTE — Telephone Encounter (Signed)
Pt aware Ditropan e-scribed.

## 2014-06-14 ENCOUNTER — Ambulatory Visit (INDEPENDENT_AMBULATORY_CARE_PROVIDER_SITE_OTHER): Payer: Commercial Managed Care - HMO | Admitting: Obstetrics & Gynecology

## 2014-06-14 ENCOUNTER — Encounter: Payer: Self-pay | Admitting: Obstetrics & Gynecology

## 2014-06-14 VITALS — BP 120/80 | Wt 140.0 lb

## 2014-06-14 DIAGNOSIS — N993 Prolapse of vaginal vault after hysterectomy: Secondary | ICD-10-CM

## 2014-06-14 NOTE — Progress Notes (Signed)
Patient ID: Sara Garcia, female   DOB: [birth year], 80 y.o.   MRN: [id number] Previously I decided to decrease size from #4 to #3  Removed and replaced today  Seems to fit a little easier  Follow up 1 month

## 2014-06-27 ENCOUNTER — Telehealth: Payer: Self-pay | Admitting: Gastroenterology

## 2014-06-27 ENCOUNTER — Encounter: Payer: Self-pay | Admitting: Gastroenterology

## 2014-06-27 ENCOUNTER — Ambulatory Visit (INDEPENDENT_AMBULATORY_CARE_PROVIDER_SITE_OTHER): Payer: Commercial Managed Care - HMO | Admitting: Gastroenterology

## 2014-06-27 VITALS — BP 129/82 | HR 106 | Temp 97.8°F | Ht 62.0 in | Wt 138.8 lb

## 2014-06-27 DIAGNOSIS — Z860100 Personal history of colon polyps, unspecified: Secondary | ICD-10-CM | POA: Insufficient documentation

## 2014-06-27 DIAGNOSIS — R1319 Other dysphagia: Secondary | ICD-10-CM | POA: Insufficient documentation

## 2014-06-27 DIAGNOSIS — R1310 Dysphagia, unspecified: Secondary | ICD-10-CM | POA: Insufficient documentation

## 2014-06-27 DIAGNOSIS — Z7901 Long term (current) use of anticoagulants: Secondary | ICD-10-CM

## 2014-06-27 DIAGNOSIS — K219 Gastro-esophageal reflux disease without esophagitis: Secondary | ICD-10-CM | POA: Insufficient documentation

## 2014-06-27 DIAGNOSIS — R1314 Dysphagia, pharyngoesophageal phase: Secondary | ICD-10-CM

## 2014-06-27 NOTE — Patient Instructions (Signed)
1. I will let you know if you need a colonoscopy once we receive your records for review.

## 2014-06-27 NOTE — Progress Notes (Signed)
Primary Care Physician:  Doree Albee, MD  Primary Gastroenterologist:  Garfield Cornea, MD   Chief Complaint  Patient presents with  . Colonoscopy  . EGD    HPI:  Sara Garcia is a 80 y.o. female here for consideration of colonoscopy. Referred by Dr Anastasio Champion.   Remote EGD/ED at [facility] Records could not be located at time of dictation. Having difficulty swallowing corn bread. Finely chops her chicken. No pill dysphagia. Belching. Some vague postprandial abdominal discomfort on occasion. Bowel movements ok with stool softener/bisacodyl. No melena, brbpr.     Current Outpatient Prescriptions  Medication Sig Dispense Refill  . acetaminophen (TYLENOL) 500 MG tablet Take 1,000 mg by mouth 2 (two) times daily as needed for mild pain, moderate pain or headache.       . bisacodyl (BISACODYL) 5 MG EC tablet Take 5 mg by mouth daily as needed for moderate constipation.      . Blood Pressure Monitor MISC as directed. Digital Blood Pressure Monitor      . Calcium Carbonate-Vitamin D (CALTRATE 600+D PO) Take 1 tablet by mouth daily.      . Cholecalciferol (D3-1000 PO) Take by mouth daily.      . DULoxetine (CYMBALTA) 60 MG capsule Take 60 mg by mouth daily.       Marland Kitchen esomeprazole (NEXIUM) 40 MG capsule Take 40 mg by mouth daily at 12 noon.      . Fluticasone-Salmeterol (ADVAIR) 250-50 MCG/DOSE AEPB Inhale 1 puff into the lungs 2 (two) times daily.      Marland Kitchen lidocaine (LIDODERM) 5 % Place 1 patch onto the skin daily. Remove & Discard patch within 12 hours or as directed by MD      . metoprolol tartrate (LOPRESSOR) 25 MG tablet Take 1 tablet (25 mg total) by mouth 2 (two) times daily.  60 tablet  6  . mirabegron ER (MYRBETRIQ) 50 MG TB24 tablet Take 1 tablet (50 mg total) by mouth daily.  30 tablet  11  . oxybutynin (DITROPAN-XL) 5 MG 24 hr tablet Take 1 tablet (5 mg total) by mouth at bedtime.  30 tablet  11  . pyridOXINE (VITAMIN B-6) 100 MG tablet Take 100 mg by mouth daily.      . sodium chloride  (OCEAN) 0.65 % SOLN nasal spray Place 1 spray into both nostrils as needed for congestion.      . solifenacin (VESICARE) 10 MG tablet Take 1 tablet (10 mg total) by mouth daily.  30 tablet  11  . vitamin B-12 (CYANOCOBALAMIN) 1000 MCG tablet Take 1,000 mcg by mouth daily.      Marland Kitchen warfarin (COUMADIN) 5 MG tablet Take 5 mg by mouth every evening. Takes 2.5 mg on Sunday, Tuesday Thursday Takes 5 mg on the other days       No current facility-administered medications for this visit.    Allergies as of 06/27/2014 - Review Complete 06/27/2014  Allergen Reaction Noted  . Latex Itching and Rash 10/20/2013  . Adhesive [tape] Other (See Comments) 10/20/2013  . Ciprofloxacin Nausea And Vomiting 10/20/2013  . Codeine Nausea And Vomiting 10/20/2013  . Penicillins Hives 10/20/2013  . Sulfa antibiotics Hives 10/20/2013    Past Medical History  Diagnosis Date  . Arthritis   . Asthma   . Osteoporosis   . DVT (deep venous thrombosis) 2011/2012    chronic coumadin, h/o blood clots on coumadin.  . Dementia   . Essential hypertension, benign   . Esophageal reflux   . GERD (  gastroesophageal reflux disease)   . Unspecified prolapse of vaginal walls   . A-fib     Past Surgical History  Procedure Laterality Date  . Abdominal hysterectomy    . Nasal septum surgery    . Tonsillectomy    . Colonoscopy w/ biopsies  02/01/2004    RMR: Anal papilla with normal rectum/Sigmoid diverticula/Small polyps in the cecum removed as described above. Inflammatory polyp  . Wrist fracture surgery      Family History  Problem Relation Age of Onset  . Colon polyps Neg Hx   . Colon cancer Neg Hx     History   Social History  . Marital Status: Married    Spouse Name: N/A    Number of Children: N/A  . Years of Education: N/A   Occupational History  . Not on file.   Social History Main Topics  . Smoking status: Never Smoker   . Smokeless tobacco: Not on file  . Alcohol Use: No  . Drug Use: No  .  Sexual Activity: Not Currently    Birth Control/ Protection: None   Other Topics Concern  . Not on file   Social History Narrative  . No narrative on file      ROS:  General: Negative for anorexia, weight loss, fever, chills, fatigue, weakness. Eyes: Negative for vision changes.  ENT: Negative for hoarseness, nasal congestion. CV: Negative for chest pain, angina, palpitations, dyspnea on exertion, peripheral edema.  Respiratory: Negative for dyspnea at rest, dyspnea on exertion, cough, sputum, wheezing.  GI: See history of present illness. GU:  Negative for dysuria, hematuria, urinary incontinence, urinary frequency, nocturnal urination.  MS: Negative for joint pain, low back pain.  Derm: Negative for rash or itching.  Neuro: Negative for weakness, abnormal sensation, seizure, frequent headaches, memory loss, confusion.  Psych: Negative for anxiety, depression, suicidal ideation, hallucinations.  Endo: Negative for unusual weight change.  Heme: Negative for bruising or bleeding. Allergy: Negative for rash or hives.    Physical Examination:  BP 129/82  Pulse 106  Temp(Src) 97.8 F (36.6 C) (Oral)  Ht 5\' 2"  (1.575 m)  Wt 138 lb 12.8 oz (62.959 kg)  BMI 25.38 kg/m2   General: Well-nourished, well-developed in no acute distress.  Head: Normocephalic, atraumatic.   Eyes: Conjunctiva pink, no icterus. Mouth: Oropharyngeal mucosa moist and pink , no lesions erythema or exudate. Neck: Supple without thyromegaly, masses, or lymphadenopathy.  Lungs: Clear to auscultation bilaterally.  Heart: Regular rate and rhythm, no murmurs rubs or gallops.  Abdomen: Bowel sounds are normal, nontender, nondistended, no hepatosplenomegaly or masses, no abdominal bruits or    hernia , no rebound or guarding.   Rectal: not performed Extremities: No lower extremity edema. No clubbing or deformities.  Neuro: Alert and oriented x 4 , grossly normal neurologically.  Skin: Warm and dry, no rash or  jaundice.   Psych: Alert and cooperative, normal mood and affect.  Labs: Lab Results  Component Value Date   WBC 6.8 01/02/2014   HGB 12.5 01/02/2014   HCT 37.9 01/02/2014   MCV 90.0 01/02/2014   PLT 211 01/02/2014     Imaging Studies: No results found.

## 2014-06-27 NOTE — Telephone Encounter (Signed)
PATIENT CALLED AT 11:15 STATING THAT SHE MAY BE LATE FOR HER APPOINTMENT, PATIENT WAS TOLD THAT IF SHE WAS 15 MINUTES LATE WE WOULD HAVE TO RESCHEDULE HER APPT.  PATIENT LIVES IN [place] AND I ASKED HER IF SHE WANTED [place] TO GO AHEAD A RESCHEDULE HER AND SHE SAID SHE WAS ALREADY DRESSED AND HUNG UP PHONE.

## 2014-06-28 ENCOUNTER — Telehealth: Payer: Self-pay

## 2014-06-28 NOTE — Telephone Encounter (Signed)
Pt called to inform [facility] that she was not taking Coumadin when she had the second blood clot.

## 2014-07-02 ENCOUNTER — Encounter: Payer: Self-pay | Admitting: Gastroenterology

## 2014-07-02 NOTE — Assessment & Plan Note (Signed)
Mild dypshagia as noted above. Patient requests EGD if she has a colonoscopy. Will discuss further with Dr. Gala Romney once records are received and reviewed.

## 2014-07-02 NOTE — Assessment & Plan Note (Signed)
80 y/o female with h/o inflammatory polyps in 2005 who presents for screening colonoscopy. Void of lower GI symptoms. She is on coumadin for h/o DVTs and Afib. Based on patient's age and no family history of polyps/colon cancer, no h/o adenomatous colon polyps, there is no clear indication for colonoscopy. I have requested records from her PCP for review to determine if there were other concerns on the part of her PCP.

## 2014-07-04 NOTE — Telephone Encounter (Signed)
noted 

## 2014-07-05 NOTE — Progress Notes (Signed)
cc'ed to pcp °

## 2014-07-11 ENCOUNTER — Encounter: Payer: Self-pay | Admitting: Cardiology

## 2014-07-11 ENCOUNTER — Ambulatory Visit (INDEPENDENT_AMBULATORY_CARE_PROVIDER_SITE_OTHER): Payer: Commercial Managed Care - HMO | Admitting: Cardiology

## 2014-07-11 VITALS — BP 160/70 | HR 68 | Ht 62.0 in | Wt 138.0 lb

## 2014-07-11 DIAGNOSIS — I4891 Unspecified atrial fibrillation: Secondary | ICD-10-CM

## 2014-07-11 MED ORDER — AMLODIPINE BESYLATE 2.5 MG PO TABS: 2.5000 mg | ORAL_TABLET | Freq: Every day | ORAL | Status: DC

## 2014-07-11 NOTE — Patient Instructions (Signed)
Your physician has recommended you make the following change in your medication: start taking Amlodipine 2.5 mg daily  Your physician recommends that you schedule a follow-up appointment in: 6 months in our [place] office.

## 2014-07-11 NOTE — Progress Notes (Signed)
Patient ID: Sara Garcia, female   DOB: 03/15/1928, 80 y.o.   MRN: [id number]    This very pleasant 80 year old lady seen by Dr Meda Coffee in March  had a syncopal episode in January. She was feeling unwell while driving her car when she had lower abdominal pain which was cramping in nature and associated with a hot feeling throughout her body. This was also associated with some visual changes and inability to focus. When she arrived at the doctor's office, she got from her car and her legs gave way and then she lost consciousness. She thinks she lost consciousness for approximately 5 minutes. There was no witnessed tongue biting, shaking of her limbs or any change in color of her body. She regained consciousness and was completely orientated. She was taken to the emergency room for further evaluation. Work up was negative. She had one similar episode in the past.  She also admits retrosternal chest pains that can happen with or without exertion. She also experiences them when she lays down. She has been contributing to his pain to her gastric reflux disease. These pains are not associated with palpitations. Or syncope. She is fairly active and only feels minimal shortness of breath on exertion.  07/11/2014 - the patient reports resolution of chest pain, no more syncopal episodes. She is complaint with her meds. No DOE, palpitations, no LE edema.   ROS: Denies fever, malais, weight loss, blurry vision, decreased visual acuity, cough, sputum, SOB, hemoptysis, pleuritic pain, palpitaitons, heartburn, abdominal pain, melena, lower extremity edema, claudication, or rash.  All other systems reviewed and negative  General: Affect appropriate Frail elderly female  HEENT: normal Neck supple with no adenopathy JVP normal no bruits no thyromegaly Lungs clear with no wheezing and good diaphragmatic motion Heart:  S1/S2 no murmur, no rub, gallop or click PMI normal Abdomen: benighn, BS positve, no tenderness, no  AAA no bruit.  No HSM or HJR Distal pulses intact with no bruits No edema Neuro non-focal Skin warm and dry No muscular weakness   Current Outpatient Prescriptions  Medication Sig Dispense Refill  . acetaminophen (TYLENOL) 500 MG tablet Take 1,000 mg by mouth 2 (two) times daily as needed for mild pain, moderate pain or headache.       . Calcium Carbonate-Vitamin D (CALTRATE 600+D PO) Take 1 tablet by mouth daily.      . Cholecalciferol (D3-1000 PO) Take by mouth daily.      . DULoxetine (CYMBALTA) 60 MG capsule Take 60 mg by mouth daily.       Marland Kitchen esomeprazole (NEXIUM) 40 MG capsule Take 40 mg by mouth daily at 12 noon.      . Fluticasone-Salmeterol (ADVAIR) 250-50 MCG/DOSE AEPB Inhale 1 puff into the lungs 2 (two) times daily.      Marland Kitchen lidocaine (LIDODERM) 5 % Place 1 patch onto the skin daily. Remove & Discard patch within 12 hours or as directed by MD      . metoprolol tartrate (LOPRESSOR) 25 MG tablet Take 1 tablet (25 mg total) by mouth 2 (two) times daily.  60 tablet  6  . oxybutynin (DITROPAN-XL) 5 MG 24 hr tablet Take 1 tablet (5 mg total) by mouth at bedtime.  30 tablet  11  . pyridOXINE (VITAMIN B-6) 100 MG tablet Take 100 mg by mouth daily.      . vitamin B-12 (CYANOCOBALAMIN) 1000 MCG tablet Take 1,000 mcg by mouth daily.      Marland Kitchen warfarin (COUMADIN) 5 MG tablet  Take 5 mg by mouth every evening. Takes 2.5 mg on Sunday, Tuesday Thursday Takes 5 mg on the other days      . Blood Pressure Monitor MISC as directed. Digital Blood Pressure Monitor       No current facility-administered medications for this visit.    Allergies  Latex; Adhesive; Ciprofloxacin; Codeine; Penicillins; and Sulfa antibiotics  Electrocardiogram:  SR PVCls    Stress myovue 12/21/13  Normal EF and no ischemia  Echo 10/21/13 AV sclerosis normal EF - Study data: Technically difficult study. - Left ventricle: The cavity size was normal. Wall thickness was increased in a pattern of mild LVH. There is  mild basal septal hypertrophy without obstructive gradient. Systolic function was normal. The estimated ejection fraction was in the range of 60% to 65%. Doppler parameters are consistent with abnormal left ventricular relaxation (grade 1 diastolic dysfunction). - Aortic valve: Mildly calcified annulus. Trileaflet; mildly thickened leaflets. Trivial regurgitation. Valve area: 1.69cm^2(VTI). Valve area: 1.79cm^2 (Vmax). - Mitral valve: Mildly calcified annulus. Mildly thickened  Holter with PVC;s and two short episodes of PAF not related to symptoms  ECG: 07/11/2014 SR, PVC, otherwise normal    Assessment and Plan  1. Syncope -   No cardiac etiology found Non recurrent observe    2. Atrial fibrillation -   PAF  Continue coumadin and beta blocker no evidence of excessive bradycardia that would need pacing       3. Chest pain :  Atypical musculoskeletal, Lexiscan nuclear stress test, that was negative for scar or ischemia.   4. Hypertension - uncontrolled, we will add amlodipine 2.5 mg po daily.   5. history of DVT - bilateral, 2 years ago on chronic Coumadin that she is compliant with.   6. Lipids -at goal   Follow - up in 6 months in [facility].    Dorothy Spark 07/11/2014

## 2014-07-14 ENCOUNTER — Ambulatory Visit: Payer: Commercial Managed Care - HMO | Admitting: Obstetrics & Gynecology

## 2014-07-19 ENCOUNTER — Emergency Department (HOSPITAL_COMMUNITY)
Admission: EM | Admit: 2014-07-19 | Discharge: 2014-07-19 | Disposition: A | Discharge status: Home / Self Care | Payer: Medicare HMO | Attending: Emergency Medicine | Admitting: Emergency Medicine

## 2014-07-19 ENCOUNTER — Encounter (HOSPITAL_COMMUNITY): Payer: Self-pay | Admitting: Emergency Medicine

## 2014-07-19 DIAGNOSIS — Z86718 Personal history of other venous thrombosis and embolism: Secondary | ICD-10-CM | POA: Diagnosis not present

## 2014-07-19 DIAGNOSIS — Z7901 Long term (current) use of anticoagulants: Secondary | ICD-10-CM | POA: Diagnosis not present

## 2014-07-19 DIAGNOSIS — Z880 Allergy status to penicillin: Secondary | ICD-10-CM | POA: Diagnosis not present

## 2014-07-19 DIAGNOSIS — I10 Essential (primary) hypertension: Secondary | ICD-10-CM | POA: Insufficient documentation

## 2014-07-19 DIAGNOSIS — K219 Gastro-esophageal reflux disease without esophagitis: Secondary | ICD-10-CM | POA: Diagnosis not present

## 2014-07-19 DIAGNOSIS — J45901 Unspecified asthma with (acute) exacerbation: Secondary | ICD-10-CM | POA: Insufficient documentation

## 2014-07-19 DIAGNOSIS — M810 Age-related osteoporosis without current pathological fracture: Secondary | ICD-10-CM | POA: Diagnosis not present

## 2014-07-19 DIAGNOSIS — I4891 Unspecified atrial fibrillation: Secondary | ICD-10-CM | POA: Diagnosis present

## 2014-07-19 DIAGNOSIS — F0390 Unspecified dementia without behavioral disturbance: Secondary | ICD-10-CM | POA: Diagnosis not present

## 2014-07-19 DIAGNOSIS — Z79899 Other long term (current) drug therapy: Secondary | ICD-10-CM | POA: Diagnosis not present

## 2014-07-19 DIAGNOSIS — Z91040 Latex allergy status: Secondary | ICD-10-CM | POA: Diagnosis not present

## 2014-07-19 DIAGNOSIS — I471 Supraventricular tachycardia: Secondary | ICD-10-CM

## 2014-07-19 DIAGNOSIS — M1990 Unspecified osteoarthritis, unspecified site: Secondary | ICD-10-CM | POA: Insufficient documentation

## 2014-07-19 LAB — CBC WITH DIFFERENTIAL/PLATELET
Basophils Absolute: 0 10*3/uL (ref 0.0–0.1)
Basophils Relative: 1 % (ref 0–1)
Eosinophils Absolute: 0.4 10*3/uL (ref 0.0–0.7)
Eosinophils Relative: 7 % — ABNORMAL HIGH (ref 0–5)
HCT: 39.5 % (ref 36.0–46.0)
Hemoglobin: 12.9 g/dL (ref 12.0–15.0)
Lymphocytes Relative: 47 % — ABNORMAL HIGH (ref 12–46)
Lymphs Abs: 2.7 10*3/uL (ref 0.7–4.0)
MCH: 28.8 pg (ref 26.0–34.0)
MCHC: 32.7 g/dL (ref 30.0–36.0)
MCV: 88.2 fL (ref 78.0–100.0)
Monocytes Absolute: 0.4 10*3/uL (ref 0.1–1.0)
Monocytes Relative: 7 % (ref 3–12)
Neutro Abs: 2.3 10*3/uL (ref 1.7–7.7)
Neutrophils Relative %: 40 % — ABNORMAL LOW (ref 43–77)
Platelets: 232 10*3/uL (ref 150–400)
RBC: 4.48 MIL/uL (ref 3.87–5.11)
RDW: 14.9 % (ref 11.5–15.5)
WBC: 5.7 10*3/uL (ref 4.0–10.5)

## 2014-07-19 LAB — BASIC METABOLIC PANEL
Anion gap: 11 (ref 5–15)
BUN: 23 mg/dL (ref 6–23)
CO2: 28 mEq/L (ref 19–32)
Calcium: 8.7 mg/dL (ref 8.4–10.5)
Chloride: 104 mEq/L (ref 96–112)
Creatinine, Ser: 0.71 mg/dL (ref 0.50–1.10)
GFR calc Af Amer: 89 mL/min — ABNORMAL LOW (ref >90)
GFR calc non Af Amer: 77 mL/min — ABNORMAL LOW (ref >90)
Glucose, Bld: 98 mg/dL (ref 70–99)
Potassium: 4.2 mEq/L (ref 3.7–5.3)
Sodium: 143 mEq/L (ref 137–147)

## 2014-07-19 LAB — TROPONIN I: Troponin I: <0.3 ng/mL (ref <0.30)

## 2014-07-19 LAB — PROTIME-INR
INR: 1.97 — ABNORMAL HIGH (ref 0.00–1.49)
Prothrombin Time: 22.4 seconds — ABNORMAL HIGH (ref 11.6–15.2)

## 2014-07-19 MED ORDER — DILTIAZEM HCL 25 MG/5ML IV SOLN
10.0000 mg | Freq: Once | INTRAVENOUS | Status: AC
  Administered 2014-07-19: 10 mg via INTRAVENOUS
  Filled 2014-07-19: qty 5

## 2014-07-19 NOTE — ED Notes (Signed)
Pt assisted with bedside commode.

## 2014-07-19 NOTE — ED Provider Notes (Signed)
CSN: [id number]     Arrival date & time 07/19/14  0759 History   First MD Initiated Contact with Patient 07/19/14 0801     Chief Complaint  Patient presents with  . Atrial Fibrillation     (Consider location/radiation/quality/duration/timing/severity/associated sxs/prior Treatment) HPI Comments: Patient presents to the ER for evaluation of rapid heartbeat. Patient reports that she woke up this morning to go to the bathroom and "didn't feel good". She can't describe her feelings any further. She did, however, reportedly telling us that she was having fluttering in her chest and rapid heartbeat. Patient did not express any chest pain. She did experience some pain in the right arm, but says she "pulled a muscle" and the pain is common for her. She did feel mildly short of breath.  EMS brought her to the ER for further evaluation. They report that she has been intermittently in atrial fibrillation and sinus rhythm. Patient does have a previous history of atrial fibrillation.  Primary cardiologist: Doctor Meda Coffee  Patient is a 80 y.o. female presenting with atrial fibrillation.  Atrial Fibrillation Associated symptoms include shortness of breath.    Past Medical History  Diagnosis Date  . Arthritis   . Asthma   . Osteoporosis   . DVT (deep venous thrombosis) 2011/2012    chronic coumadin, h/o blood clots on coumadin.  . Dementia   . Essential hypertension, benign   . Esophageal reflux   . GERD (gastroesophageal reflux disease)   . Unspecified prolapse of vaginal walls   . A-fib    Past Surgical History  Procedure Laterality Date  . Abdominal hysterectomy    . Nasal septum surgery    . Tonsillectomy    . Colonoscopy w/ biopsies  02/01/2004    RMR: Anal papilla with normal rectum/Sigmoid diverticula/Small polyps in the cecum removed as described above. Inflammatory polyp  . Wrist fracture surgery     Family History  Problem Relation Age of Onset  . Colon polyps Neg Hx   . Colon  cancer Neg Hx    History  Substance Use Topics  . Smoking status: Never Smoker   . Smokeless tobacco: Not on file  . Alcohol Use: No   OB History   Grav Para Term Preterm Abortions TAB SAB Ect Mult Living                 Review of Systems  Respiratory: Positive for shortness of breath.   Cardiovascular: Positive for palpitations.  All other systems reviewed and are negative.     Allergies  Latex; Adhesive; Ciprofloxacin; Codeine; Penicillins; and Sulfa antibiotics  Home Medications   Prior to Admission medications   Medication Sig Start Date End Date Taking? Authorizing Provider  acetaminophen (TYLENOL) 500 MG tablet Take 1,000 mg by mouth 2 (two) times daily as needed for mild pain, moderate pain or headache.     Historical Provider, MD  amLODipine (NORVASC) 2.5 MG tablet Take 1 tablet (2.5 mg total) by mouth daily. 07/11/14   Dorothy Spark, MD  Blood Pressure Monitor MISC as directed. Digital Blood Pressure Monitor 01/08/14   Historical Provider, MD  Calcium Carbonate-Vitamin D (CALTRATE 600+D PO) Take 1 tablet by mouth daily.    Historical Provider, MD  Cholecalciferol (D3-1000 PO) Take by mouth daily.    Historical Provider, MD  DULoxetine (CYMBALTA) 60 MG capsule Take 60 mg by mouth daily.     Historical Provider, MD  esomeprazole (NEXIUM) 40 MG capsule Take 40 mg by mouth daily  at 12 noon.    Historical Provider, MD  Fluticasone-Salmeterol (ADVAIR) 250-50 MCG/DOSE AEPB Inhale 1 puff into the lungs 2 (two) times daily.    Historical Provider, MD  lidocaine (LIDODERM) 5 % Place 1 patch onto the skin daily. Remove & Discard patch within 12 hours or as directed by MD    Historical Provider, MD  metoprolol tartrate (LOPRESSOR) 25 MG tablet Take 1 tablet (25 mg total) by mouth 2 (two) times daily. 01/10/14   Dorothy Spark, MD  oxybutynin (DITROPAN-XL) 5 MG 24 hr tablet Take 1 tablet (5 mg total) by mouth at bedtime. 06/01/14   Florian Buff, MD  pyridOXINE (VITAMIN B-6) 100  MG tablet Take 100 mg by mouth daily.    Historical Provider, MD  vitamin B-12 (CYANOCOBALAMIN) 1000 MCG tablet Take 1,000 mcg by mouth daily.    Historical Provider, MD  warfarin (COUMADIN) 5 MG tablet Take 5 mg by mouth every evening. Takes 2.5 mg on Sunday, Tuesday Thursday Takes 5 mg on the other days    Historical Provider, MD   There were no vitals taken for this visit. Physical Exam  Constitutional: She is oriented to person, place, and time. She appears well-developed and well-nourished. No distress.  HENT:  Head: Normocephalic and atraumatic.  Right Ear: Hearing normal.  Left Ear: Hearing normal.  Nose: Nose normal.  Mouth/Throat: Oropharynx is clear and moist and mucous membranes are normal.  Eyes: Conjunctivae and EOM are normal. Pupils are equal, round, and reactive to light.  Neck: Normal range of motion. Neck supple.  Cardiovascular: S1 normal and S2 normal.  An irregular rhythm present. Tachycardia present.  Exam reveals no gallop and no friction rub.   No murmur heard. Pulmonary/Chest: Effort normal and breath sounds normal. No respiratory distress. She exhibits no tenderness.  Abdominal: Soft. Normal appearance and bowel sounds are normal. There is no hepatosplenomegaly. There is no tenderness. There is no rebound, no guarding, no tenderness at McBurney's point and negative Murphy's sign. No hernia.  Musculoskeletal: Normal range of motion.  Neurological: She is alert and oriented to person, place, and time. She has normal strength. No cranial nerve deficit or sensory deficit. Coordination normal. GCS eye subscore is 4. GCS verbal subscore is 5. GCS motor subscore is 6.  Skin: Skin is warm, dry and intact. No rash noted. No cyanosis.  Psychiatric: She has a normal mood and affect. Her speech is normal and behavior is normal. Thought content normal.    ED Course  Procedures (including critical care time) Labs Review Labs Reviewed - No data to display  Imaging Review No  results found.   EKG Interpretation   Date/Time:  Tuesday July 19 2014 08:32:26 EDT Ventricular Rate:  75 PR Interval:  148 QRS Duration: 105 QT Interval:  419 QTC Calculation: 468 R Axis:   -8 Text Interpretation:  Sinus rhythm Ventricular premature complex  Borderline low voltage, extremity leads Confirmed by Keesha Pellum  MD,  Shatara Stanek ([id number]) on 07/19/2014 8:36:18 AM       MDM   Final diagnoses:  None   tachycardia  Patient presented to the ER for evaluation of rapid heartbeat. Patient exhibiting ectopic atrial tachycardia upon arrival. This resolved after an IV Cardizem bolus. Patient has been monitored for a period of time has not had any recurrence of tachycardia. Cardiac workup was otherwise unremarkable. She is not experiencing any chest discomfort. The patient will be discharged to followup with her cardiologist in the office. Patient is to  return to the ER she has any recurrent tachycardia.    Orpah Greek, MD 07/19/14 [phone]

## 2014-07-19 NOTE — ED Notes (Signed)
Pt finished using bedside commode without distress; pt back on monitor.

## 2014-07-19 NOTE — ED Notes (Addendum)
To ed [facility] from home with c/o rapid heart rate- pt woke up with "just did not feel good, left arm hurt a little-- but I have arthritis so I thought that was what it was. " at 5:50 this am. Denies pain. States dr's have been changing her medicine. A/O x4 on arrival, husband at bedside.

## 2014-07-19 NOTE — Discharge Instructions (Signed)
Schedule followup with your cardiologist in the office as soon as possible. If you have any recurrence of palpitations, rapid heart rate, irregular heart rate, chest pain, return to the ER immediately.  Palpitations A palpitation is the feeling that your heartbeat is irregular or is faster than normal. It may feel like your heart is fluttering or skipping a beat. Palpitations are usually not a serious problem. However, in some cases, you may need further medical evaluation. CAUSES  Palpitations can be caused by:  Smoking.  Caffeine or other stimulants, such as diet pills or energy drinks.  Alcohol.  Stress and anxiety.  Strenuous physical activity.  Fatigue.  Certain medicines.  Heart disease, especially if you have a history of irregular heart rhythms (arrhythmias), such as atrial fibrillation, atrial flutter, or supraventricular tachycardia.  An improperly working pacemaker or defibrillator. DIAGNOSIS  To find the cause of your palpitations, your health care provider will take your medical history and perform a physical exam. Your health care provider may also have you take a test called an ambulatory electrocardiogram (ECG). An ECG records your heartbeat patterns over a 24-hour period. You may also have other tests, such as:  Transthoracic echocardiogram (TTE). During echocardiography, sound waves are used to evaluate how blood flows through your heart.  Transesophageal echocardiogram (TEE).  Cardiac monitoring. This allows your health care provider to monitor your heart rate and rhythm in real time.  Holter monitor. This is a portable device that records your heartbeat and can help diagnose heart arrhythmias. It allows your health care provider to track your heart activity for several days, if needed.  Stress tests by exercise or by giving medicine that makes the heart beat faster. TREATMENT  Treatment of palpitations depends on the cause of your symptoms and can vary  greatly. Most cases of palpitations do not require any treatment other than time, relaxation, and monitoring your symptoms. Other causes, such as atrial fibrillation, atrial flutter, or supraventricular tachycardia, usually require further treatment. HOME CARE INSTRUCTIONS   Avoid:  Caffeinated coffee, tea, soft drinks, diet pills, and energy drinks.  Chocolate.  Alcohol.  Stop smoking if you smoke.  Reduce your stress and anxiety. Things that can help you relax include:  A method of controlling things in your body, such as your heartbeats, with your mind (biofeedback).  Yoga.  Meditation.  Physical activity such as swimming, jogging, or walking.  Get plenty of rest and sleep. SEEK MEDICAL CARE IF:   You continue to have a fast or irregular heartbeat beyond 24 hours.  Your palpitations occur more often. SEEK IMMEDIATE MEDICAL CARE IF:  You have chest pain or shortness of breath.  You have a severe headache.  You feel dizzy or you faint. MAKE SURE YOU:  Understand these instructions.  Will watch your condition.  Will get help right away if you are not doing well or get worse. Document Released: 09/27/2000 Document Revised: 10/05/2013 Document Reviewed: 11/29/2011 [facility] Patient Information 2015 [place], [place]. This information is not intended to replace advice given to you by your health care provider. Make sure you discuss any questions you have with your health care provider.

## 2014-07-22 ENCOUNTER — Ambulatory Visit (INDEPENDENT_AMBULATORY_CARE_PROVIDER_SITE_OTHER): Payer: Commercial Managed Care - HMO | Admitting: Cardiology

## 2014-07-22 ENCOUNTER — Encounter: Payer: Self-pay | Admitting: Cardiology

## 2014-07-22 VITALS — BP 111/66 | HR 64 | Ht 62.0 in | Wt 137.8 lb

## 2014-07-22 DIAGNOSIS — R0789 Other chest pain: Secondary | ICD-10-CM

## 2014-07-22 DIAGNOSIS — I10 Essential (primary) hypertension: Secondary | ICD-10-CM

## 2014-07-22 DIAGNOSIS — I4891 Unspecified atrial fibrillation: Secondary | ICD-10-CM

## 2014-07-22 MED ORDER — METOPROLOL TARTRATE 25 MG PO TABS: 37.5000 mg | ORAL_TABLET | Freq: Two times a day (BID) | ORAL | Status: DC

## 2014-07-22 NOTE — Progress Notes (Signed)
Clinical Summary Ms. Jubb is a 80 y.o.female last seen by Dr Meda Coffee, this is our first visit together. She is seen for the following medical problems.  1. Atypical chest pain - 12/2013 Lexiscan MPI no ischemia - echo Jan 2015 LVEF 36-14%, grade I diastolic dysfunction.  - denies any recent symptoms  3. Parox afib - on beta blocker for rate control, coumadin for stroke prophylaxis - seen in ER 07/19/14 with palpitations, EKG with atach that resolved with IV dilt. Trop neg, K 4.2 - since discharge had one repeat episode of palpitations  3. HTN - compliant with meds  4. Hx of DVT - on chronic coumadin, denies any bleeding issues    Past Medical History  Diagnosis Date  . Arthritis   . Asthma   . Osteoporosis   . DVT (deep venous thrombosis) 2011/2012    chronic coumadin, h/o blood clots on coumadin.  . Dementia   . Essential hypertension, benign   . Esophageal reflux   . GERD (gastroesophageal reflux disease)   . Unspecified prolapse of vaginal walls   . A-fib      Allergies  Allergen Reactions  . Latex Itching and Rash  . Adhesive [Tape] Other (See Comments)    Tears skin  . Ciprofloxacin Nausea And Vomiting  . Codeine Nausea And Vomiting    Upsets stomach, nightmares  . Penicillins Hives  . Sulfa Antibiotics Hives     Current Outpatient Prescriptions  Medication Sig Dispense Refill  . acetaminophen (TYLENOL) 500 MG tablet Take 1,000 mg by mouth 2 (two) times daily as needed for mild pain, moderate pain or headache.       Marland Kitchen amLODipine (NORVASC) 2.5 MG tablet Take 1 tablet (2.5 mg total) by mouth daily.  30 tablet  6  . Blood Pressure Monitor MISC as directed. Digital Blood Pressure Monitor      . Calcium Carbonate-Vitamin D (CALTRATE 600+D PO) Take 1 tablet by mouth daily.      . Cholecalciferol (D3-1000 PO) Take 1,000 mg by mouth daily.       . DULoxetine (CYMBALTA) 60 MG capsule Take 60 mg by mouth daily.       Marland Kitchen esomeprazole (NEXIUM) 40 MG capsule Take  40 mg by mouth daily.       . Fluticasone-Salmeterol (ADVAIR) 250-50 MCG/DOSE AEPB Inhale 1 puff into the lungs daily.       Marland Kitchen lidocaine (LIDODERM) 5 % Place 1 patch onto the skin daily as needed (for pain). Remove & Discard patch within 12 hours or as directed by MD      . metoprolol tartrate (LOPRESSOR) 25 MG tablet Take 1 tablet (25 mg total) by mouth 2 (two) times daily.  60 tablet  6  . oxybutynin (DITROPAN-XL) 5 MG 24 hr tablet Take 1 tablet (5 mg total) by mouth at bedtime.  30 tablet  11  . pyridOXINE (VITAMIN B-6) 100 MG tablet Take 100 mg by mouth daily.      . vitamin B-12 (CYANOCOBALAMIN) 1000 MCG tablet Take 1,000 mcg by mouth daily.      Marland Kitchen warfarin (COUMADIN) 5 MG tablet Take 5 mg by mouth every evening. Takes 2.5 mg on Sunday, Tuesday Thursday Takes 5 mg on the other days       No current facility-administered medications for this visit.     Past Surgical History  Procedure Laterality Date  . Abdominal hysterectomy    . Nasal septum surgery    . Tonsillectomy    .  Colonoscopy w/ biopsies  02/01/2004    RMR: Anal papilla with normal rectum/Sigmoid diverticula/Small polyps in the cecum removed as described above. Inflammatory polyp  . Wrist fracture surgery       Allergies  Allergen Reactions  . Latex Itching and Rash  . Adhesive [Tape] Other (See Comments)    Tears skin  . Ciprofloxacin Nausea And Vomiting  . Codeine Nausea And Vomiting    Upsets stomach, nightmares  . Penicillins Hives  . Sulfa Antibiotics Hives      Family History  Problem Relation Age of Onset  . Colon polyps Neg Hx   . Colon cancer Neg Hx      Social History Ms. Limon reports that she has never smoked. She does not have any smokeless tobacco history on file. Ms. Peed reports that she does not drink alcohol.   Review of Systems CONSTITUTIONAL: No weight loss, fever, chills, weakness or fatigue.  HEENT: Eyes: No visual loss, blurred vision, double vision or yellow sclerae.No hearing  loss, sneezing, congestion, runny nose or sore throat.  SKIN: No rash or itching.  CARDIOVASCULAR: per HPI RESPIRATORY: No shortness of breath, cough or sputum.  GASTROINTESTINAL: No anorexia, nausea, vomiting or diarrhea. No abdominal pain or blood.  GENITOURINARY: No burning on urination, no polyuria NEUROLOGICAL: No headache, dizziness, syncope, paralysis, ataxia, numbness or tingling in the extremities. No change in bowel or bladder control.  MUSCULOSKELETAL: No muscle, back pain, joint pain or stiffness.  LYMPHATICS: No enlarged nodes. No history of splenectomy.  PSYCHIATRIC: No history of depression or anxiety.  ENDOCRINOLOGIC: No reports of sweating, cold or heat intolerance. No polyuria or polydipsia.  Marland Kitchen   Physical Examination p 64 bp 111/66 Wt 137 lbs BMI 25 Gen: resting comfortably, no acute distress HEENT: no scleral icterus, pupils equal round and reactive, no palptable cervical adenopathy,  CV: RRR, no m/r/g, no JVD, no carotid bruits Resp: Clear to auscultation bilaterally GI: abdomen is soft, non-tender, non-distended, normal bowel sounds, no hepatosplenomegaly MSK: extremities are warm, no edema.  Skin: warm, no rash Neuro:  no focal deficits Psych: appropriate affect   Diagnostic Studies 12/2013 Lexicscan MPI Impression  Exercise Capacity: Lexiscan with no exercise.  BP Response: Normal blood pressure response.  Clinical Symptoms: Nausea  ECG Impression: No significant ST segment change suggestive of ischemia.  Comparison with Prior Nuclear Study: No previous nuclear study performed  Overall Impression: Normal stress nuclear study.  LV Ejection Fraction: 85%. LV Wall Motion: NL LV Function; NL Wall Motion    Jan 2015 echo Study Conclusions  - Study data: Technically difficult study. - Left ventricle: The cavity size was normal. Wall thickness was increased in a pattern of mild LVH. There is mild basal septal hypertrophy without obstructive  gradient. Systolic function was normal. The estimated ejection fraction was in the range of 60% to 65%. Doppler parameters are consistent with abnormal left ventricular relaxation (grade 1 diastolic dysfunction). - Aortic valve: Mildly calcified annulus. Trileaflet; mildly thickened leaflets. Trivial regurgitation. Valve area: 1.69cm^2(VTI). Valve area: 1.79cm^2 (Vmax). - Mitral valve: Mildly calcified annulus. Mildly thickened leaflets .      Assessment and Plan  1. Atypical chest pain - recent negative stress, no current symptoms. No further cardiac workup at this time.  2. Parox afib - recent ER visit with palpitations, found to be in atach with elevated rates. Resolved with IV dilt push. Since that time one recurrence of palpitations - increase lopressor to 37.5mg  bid  3. HTN - since increasing  her lopressor, will have her stop her norvasc at this time.   4. Hx of DVT - continue coumadin   F/u 4 weeks      Arnoldo Lenis, M.D.

## 2014-07-22 NOTE — Patient Instructions (Addendum)
   Stop Amlodipine (Norvasc)  Change Lopressor to 37.5mg  twice a day. (1 and a half tablets twice a day) Continue all other medications.   Your physician wants you to follow up in:  1 month.

## 2014-08-11 ENCOUNTER — Ambulatory Visit (INDEPENDENT_AMBULATORY_CARE_PROVIDER_SITE_OTHER): Payer: Commercial Managed Care - HMO | Admitting: Obstetrics & Gynecology

## 2014-08-11 ENCOUNTER — Encounter: Payer: Self-pay | Admitting: Obstetrics & Gynecology

## 2014-08-11 VITALS — BP 140/80 | Wt 131.0 lb

## 2014-08-11 DIAGNOSIS — N993 Prolapse of vaginal vault after hysterectomy: Secondary | ICD-10-CM

## 2014-08-11 NOTE — Progress Notes (Signed)
Patient ID: Sara Garcia, female   DOB: [birth year], 80 y.o.   MRN: [id number] Pt here for removal and cleaning of Milex ring with support #3 Doing well without problems  Blood pressure 140/80, weight 131 lb (59.421 kg).  Removed  No undue vaginal pressure, no mucosal breakdown replaced  Follow up 4 months

## 2014-08-12 ENCOUNTER — Ambulatory Visit: Payer: Commercial Managed Care - HMO | Admitting: Obstetrics & Gynecology

## 2014-08-24 NOTE — Progress Notes (Signed)
Please let patient know that I reviewed her records from her PCP. There was no evidence of anemia or other indication for colonoscopy. Her hemoglobin was 12.7. LFTs were normal. TSH normal.  At this time, no clear indication that she should have another colonoscopy. I would offer her a barium pill esophagram to further evaluate dysphagia. Offer her follow-up office visit.

## 2014-08-26 NOTE — Progress Notes (Signed)
Tried to call pt- LM 

## 2014-08-29 ENCOUNTER — Telehealth: Payer: Self-pay | Admitting: *Deleted

## 2014-08-29 NOTE — Telephone Encounter (Signed)
Patient returned call please call.

## 2014-08-30 ENCOUNTER — Encounter: Payer: Self-pay | Admitting: Cardiology

## 2014-08-30 ENCOUNTER — Ambulatory Visit (INDEPENDENT_AMBULATORY_CARE_PROVIDER_SITE_OTHER): Payer: Commercial Managed Care - HMO | Admitting: Cardiology

## 2014-08-30 VITALS — BP 132/66 | HR 56 | Ht 62.0 in | Wt 139.0 lb

## 2014-08-30 DIAGNOSIS — I10 Essential (primary) hypertension: Secondary | ICD-10-CM

## 2014-08-30 DIAGNOSIS — I4891 Unspecified atrial fibrillation: Secondary | ICD-10-CM

## 2014-08-30 DIAGNOSIS — R0789 Other chest pain: Secondary | ICD-10-CM

## 2014-08-30 NOTE — Progress Notes (Signed)
Clinical Summary [name] is a 80 y.o.female seen today for follow up of the following medical problems.   1. Atypical chest pain - 12/2013 Lexiscan MPI no ischemia - echo Jan 2015 LVEF 27-78%, grade I diastolic dysfunction. - denies any recent chest pain symptoms  3. Parox afib - on beta blocker for rate control, coumadin for stroke prophylaxis - denies any significant palpitations since last visit. Denies any lightheadedness or dizziness  3. HTN - compliant with meds  4. Hx of DVT - on chronic coumadin, denies any bleeding issues. Followed by Dr Anastasio Champion.  Past Medical History  Diagnosis Date  . Arthritis   . Asthma   . Osteoporosis   . DVT (deep venous thrombosis) 2011/2012    chronic coumadin, h/o blood clots on coumadin.  . Dementia   . Essential hypertension, benign   . Esophageal reflux   . GERD (gastroesophageal reflux disease)   . Unspecified prolapse of vaginal walls   . A-fib      Allergies  Allergen Reactions  . Latex Itching and Rash  . Adhesive [Tape] Other (See Comments)    Tears skin  . Ciprofloxacin Nausea And Vomiting  . Codeine Nausea And Vomiting    Upsets stomach, nightmares  . Penicillins Hives  . Sulfa Antibiotics Hives     Current Outpatient Prescriptions  Medication Sig Dispense Refill  . acetaminophen (TYLENOL) 500 MG tablet Take 1,000 mg by mouth 2 (two) times daily as needed for mild pain, moderate pain or headache.     . Calcium Carbonate-Vitamin D (CALTRATE 600+D PO) Take 1 tablet by mouth daily.    . Cholecalciferol (D3-1000 PO) Take 1,000 mg by mouth daily.     . DULoxetine (CYMBALTA) 60 MG capsule Take 60 mg by mouth daily.     Marland Kitchen esomeprazole (NEXIUM) 40 MG capsule Take 40 mg by mouth daily.     . Fluticasone-Salmeterol (ADVAIR) 250-50 MCG/DOSE AEPB Inhale 1 puff into the lungs daily.     Marland Kitchen lidocaine (LIDODERM) 5 % Place 1 patch onto the skin daily as needed (for pain). Remove & Discard patch within 12 hours or as directed  by MD    . metoprolol tartrate (LOPRESSOR) 25 MG tablet Take 1.5 tablets (37.5 mg total) by mouth 2 (two) times daily. 60 tablet 6  . oxybutynin (DITROPAN-XL) 5 MG 24 hr tablet Take 1 tablet (5 mg total) by mouth at bedtime. 30 tablet 11  . pyridOXINE (VITAMIN B-6) 100 MG tablet Take 100 mg by mouth daily.    . vitamin B-12 (CYANOCOBALAMIN) 1000 MCG tablet Take 1,000 mcg by mouth daily.    Marland Kitchen warfarin (COUMADIN) 5 MG tablet Take 5 mg by mouth every evening. Takes 2.5 mg on Sunday, Tuesday Thursday Takes 5 mg on the other days MANAGED BY PMD.     No current facility-administered medications for this visit.     Past Surgical History  Procedure Laterality Date  . Abdominal hysterectomy    . Nasal septum surgery    . Tonsillectomy    . Colonoscopy w/ biopsies  02/01/2004    RMR: Anal papilla with normal rectum/Sigmoid diverticula/Small polyps in the cecum removed as described above. Inflammatory polyp  . Wrist fracture surgery       Allergies  Allergen Reactions  . Latex Itching and Rash  . Adhesive [Tape] Other (See Comments)    Tears skin  . Ciprofloxacin Nausea And Vomiting  . Codeine Nausea And Vomiting    Upsets  stomach, nightmares  . Penicillins Hives  . Sulfa Antibiotics Hives      Family History  Problem Relation Age of Onset  . Colon polyps Neg Hx   . Colon cancer Neg Hx      Social History Ms. Mahrt reports that she has never smoked. She does not have any smokeless tobacco history on file. Ms. Bolda reports that she does not drink alcohol.   Review of Systems CONSTITUTIONAL: No weight loss, fever, chills, weakness or fatigue.  HEENT: Eyes: No visual loss, blurred vision, double vision or yellow sclerae.No hearing loss, sneezing, congestion, runny nose or sore throat.  SKIN: No rash or itching.  CARDIOVASCULAR: per HPI RESPIRATORY: No shortness of breath, cough or sputum.  GASTROINTESTINAL: No anorexia, nausea, vomiting or diarrhea. No abdominal pain or blood.    GENITOURINARY: No burning on urination, no polyuria NEUROLOGICAL: No headache, dizziness, syncope, paralysis, ataxia, numbness or tingling in the extremities. No change in bowel or bladder control.  MUSCULOSKELETAL: No muscle, back pain, joint pain or stiffness.  LYMPHATICS: No enlarged nodes. No history of splenectomy.  PSYCHIATRIC: No history of depression or anxiety.  ENDOCRINOLOGIC: No reports of sweating, cold or heat intolerance. No polyuria or polydipsia.  Marland Kitchen   Physical Examination p 56 bp 132/66 Wt 139 lbs BMI 25 Gen: resting comfortably, no acute distress HEENT: no scleral icterus, pupils equal round and reactive, no palptable cervical adenopathy,  CV: RRR, no m/r/g, no JVD, no carotid bruits Resp: Clear to auscultation bilaterally GI: abdomen is soft, non-tender, non-distended, normal bowel sounds, no hepatosplenomegaly MSK: extremities are warm, no edema.  Skin: warm, no rash Neuro:  no focal deficits Psych: appropriate affect   Diagnostic Studies 12/2013 Lexicscan MPI Impression  Exercise Capacity: Lexiscan with no exercise.  BP Response: Normal blood pressure response.  Clinical Symptoms: Nausea  ECG Impression: No significant ST segment change suggestive of ischemia.  Comparison with Prior Nuclear Study: No previous nuclear study performed  Overall Impression: Normal stress nuclear study.  LV Ejection Fraction: 85%. LV Wall Motion: NL LV Function; NL Wall Motion    Jan 2015 echo Study Conclusions  - Study data: Technically difficult study. - Left ventricle: The cavity size was normal. Wall thickness was increased in a pattern of mild LVH. There is mild basal septal hypertrophy without obstructive gradient. Systolic function was normal. The estimated ejection fraction was in the range of 60% to 65%. Doppler parameters are consistent with abnormal left ventricular relaxation (grade 1 diastolic dysfunction). - Aortic valve: Mildly calcified annulus.  Trileaflet; mildly thickened leaflets. Trivial regurgitation. Valve area: 1.69cm^2(VTI). Valve area: 1.79cm^2 (Vmax). - Mitral valve: Mildly calcified annulus. Mildly thickened leaflets .    Assessment and Plan   1. Atypical chest pain - recent negative stress, no current symptoms. No further cardiac workup at this time.  2. Parox afib -denies any current symptoms, continue current meds - she is interested in possibly changing to eliquis, reports troubles getting regular transportation back and forth to coumadin clinic. Will have our office look into costs. She has also asked that we look into this for her husband as well Burna Cash (DOB [birth year]/13/1937)  3. HTN - at goal, continue currentm eds  4. Hx of DVT - continue coumadin     Arnoldo Lenis, M.D.

## 2014-08-30 NOTE — Patient Instructions (Signed)
Continue all current medications. Follow up in  3 months 

## 2014-08-31 ENCOUNTER — Ambulatory Visit: Payer: Commercial Managed Care - HMO | Admitting: Cardiology

## 2014-08-31 NOTE — Progress Notes (Signed)
Pt is aware.  Please schedule BPE.  Please schedule ov.

## 2014-08-31 NOTE — Telephone Encounter (Signed)
I spoke with the pt. Per LSL, She needs a BPE scheduled for dysphagia and an ov. See addendum to ov note.  Please schedule.

## 2014-09-01 ENCOUNTER — Encounter: Payer: Self-pay | Admitting: Internal Medicine

## 2014-09-01 ENCOUNTER — Other Ambulatory Visit: Payer: Self-pay

## 2014-09-01 DIAGNOSIS — R1314 Dysphagia, pharyngoesophageal phase: Secondary | ICD-10-CM

## 2014-09-01 NOTE — Telephone Encounter (Signed)
Pt has BPE on 09/06/14 @ [facility] @ 11:00 am. Pt aware.

## 2014-09-01 NOTE — Telephone Encounter (Signed)
HAS APPOINTMENT 10/13/14 LETTER SENT

## 2014-09-01 NOTE — Progress Notes (Signed)
APPT MADE AND LETTER SENT  °

## 2014-09-01 NOTE — Progress Notes (Signed)
Pt has BPE scheduled at [facility] for 09/06/2014 @11 :00 am. Spoke with pt and she is aware.

## 2014-09-05 ENCOUNTER — Other Ambulatory Visit: Payer: Self-pay | Admitting: *Deleted

## 2014-09-05 MED ORDER — METOPROLOL TARTRATE 25 MG PO TABS: 37.5000 mg | ORAL_TABLET | Freq: Two times a day (BID) | ORAL | Status: DC

## 2014-09-06 ENCOUNTER — Ambulatory Visit (HOSPITAL_COMMUNITY)
Admission: RE | Admit: 2014-09-06 | Discharge: 2014-09-06 | Disposition: A | Discharge status: Home / Self Care | Payer: Medicare HMO | Source: Ambulatory Visit | Attending: Internal Medicine | Admitting: Internal Medicine

## 2014-09-06 DIAGNOSIS — R1310 Dysphagia, unspecified: Secondary | ICD-10-CM | POA: Diagnosis present

## 2014-09-06 DIAGNOSIS — R1314 Dysphagia, pharyngoesophageal phase: Secondary | ICD-10-CM

## 2014-09-20 ENCOUNTER — Telehealth: Payer: Self-pay | Admitting: Obstetrics & Gynecology

## 2014-09-20 NOTE — Telephone Encounter (Signed)
Pt states the oxybutynin 5 mg is not helping, pt not able to make an appt at this time due to $45.00 copay is the an alternative medication?

## 2014-09-21 ENCOUNTER — Other Ambulatory Visit: Payer: Self-pay

## 2014-09-21 DIAGNOSIS — I729 Aneurysm of unspecified site: Secondary | ICD-10-CM

## 2014-09-26 ENCOUNTER — Other Ambulatory Visit (HOSPITAL_COMMUNITY): Payer: Commercial Managed Care - HMO

## 2014-09-27 ENCOUNTER — Encounter (HOSPITAL_COMMUNITY): Payer: Self-pay

## 2014-09-27 ENCOUNTER — Ambulatory Visit (HOSPITAL_COMMUNITY)
Admission: RE | Admit: 2014-09-27 | Discharge: 2014-09-27 | Disposition: A | Discharge status: Home / Self Care | Payer: Commercial Managed Care - HMO | Source: Ambulatory Visit | Attending: Internal Medicine | Admitting: Internal Medicine

## 2014-09-27 DIAGNOSIS — K449 Diaphragmatic hernia without obstruction or gangrene: Secondary | ICD-10-CM | POA: Insufficient documentation

## 2014-09-27 DIAGNOSIS — R911 Solitary pulmonary nodule: Secondary | ICD-10-CM | POA: Insufficient documentation

## 2014-09-27 DIAGNOSIS — M4186 Other forms of scoliosis, lumbar region: Secondary | ICD-10-CM | POA: Insufficient documentation

## 2014-09-27 DIAGNOSIS — K224 Dyskinesia of esophagus: Secondary | ICD-10-CM | POA: Insufficient documentation

## 2014-09-27 DIAGNOSIS — I729 Aneurysm of unspecified site: Secondary | ICD-10-CM

## 2014-09-27 LAB — POCT I-STAT CREATININE: Creatinine, Ser: 0.9 mg/dL (ref 0.50–1.10)

## 2014-09-27 MED ORDER — IOHEXOL 350 MG/ML SOLN
100.0000 mL | Freq: Once | INTRAVENOUS | Status: AC | PRN
  Administered 2014-09-27: 100 mL via INTRAVENOUS

## 2014-10-04 ENCOUNTER — Ambulatory Visit (INDEPENDENT_AMBULATORY_CARE_PROVIDER_SITE_OTHER): Payer: Commercial Managed Care - HMO | Admitting: Obstetrics & Gynecology

## 2014-10-04 ENCOUNTER — Encounter: Payer: Self-pay | Admitting: Obstetrics & Gynecology

## 2014-10-04 VITALS — BP 122/80 | Wt 139.0 lb

## 2014-10-04 DIAGNOSIS — N993 Prolapse of vaginal vault after hysterectomy: Secondary | ICD-10-CM

## 2014-10-04 NOTE — Progress Notes (Signed)
Patient ID: Sara Garcia, female   DOB: [birth year], 80 y.o.   MRN: [id number]      Sara Garcia presents today for routine follow up related to her pessary.   She uses a milex ring with support #3. She reports no vaginal discharge or vaginal bleeding.  Exam reveals no undue vaginal mucosal pressure of breakdown, no discharge and no vaginal bleeding.  The pessary is removed, cleaned and replaced without difficulty.    Sara Garcia will be sen back in 3 months for continued follow up.  Sara Garcia H 10/04/2014 3:13 PM

## 2014-10-05 NOTE — Progress Notes (Signed)
APPOINTMENT CANCELLED AND PUT ON RECALL FOR 3 MONTH

## 2014-10-13 ENCOUNTER — Telehealth: Payer: Self-pay

## 2014-10-13 ENCOUNTER — Encounter: Payer: Self-pay | Admitting: Gastroenterology

## 2014-10-13 ENCOUNTER — Ambulatory Visit (INDEPENDENT_AMBULATORY_CARE_PROVIDER_SITE_OTHER): Payer: Commercial Managed Care - HMO | Admitting: Nurse Practitioner

## 2014-10-13 VITALS — BP 134/76 | HR 65 | Temp 98.1°F | Ht 62.0 in | Wt 139.2 lb

## 2014-10-13 DIAGNOSIS — R1310 Dysphagia, unspecified: Secondary | ICD-10-CM

## 2014-10-13 DIAGNOSIS — R1314 Dysphagia, pharyngoesophageal phase: Secondary | ICD-10-CM

## 2014-10-13 DIAGNOSIS — K219 Gastro-esophageal reflux disease without esophagitis: Secondary | ICD-10-CM

## 2014-10-13 DIAGNOSIS — R1319 Other dysphagia: Secondary | ICD-10-CM

## 2014-10-13 NOTE — Patient Instructions (Signed)
1. We will call you to schedule your EGD and possible stretching of your esophagus until we can get in touch with your doctor to ask how they'd like [place] to manage your coumadin with the procedure. 2. Further recommendations to follow based on your doctor's opinion and results of the procedure.

## 2014-10-13 NOTE — Progress Notes (Signed)
agree

## 2014-10-13 NOTE — Progress Notes (Signed)
Referring Provider: Doree Albee, MD Primary Care Physician:  Doree Albee, MD  Primary GI: Dr. Gala Romney  Chief Complaint  Patient presents with  . Dysphagia    HPI:   80 year old female presents for followup on dysphagia. BPE on 11/24 showed brief sticking of the 13 mm barium tablet at the level of the aortic arch with symptoms reporduction but no visible stricture. CT on 12/15 showed No evidence of aortic aneurism with compression upon the esophagus. Receommendations at that time were to chew food well and have adequate fluids on hand to assist with swallowing and if continued symptoms despite this could consider EGD with empiric dilation.  Today she states she's doing about the same. She says if she's not very careful about how she's eating, and not talking while eating her meal then she still has dysphagia symptoms "I get strangled up." Denies nausea and vomiting. Dysphagia is solid food and occasional pill dysphagia if the pill is smaller or not coated. No dysphagia of liquids. Has bowel movements once a day. Takes occasional laxative if she's having constipation which works well for her. Tries to consume appropriate amounts of fiber and water. Admits occasional epigastric burning but unable to quantify how often. Is currently on Nexium 40mg  daily. Denies hematochezia and melena. Had her esophagus stretched "years ago" and helped her symptoms at that time though records are not currently available and patient unable to give the year of the previous procedure. Also states that dysphagia runs in her family. Does have tolerable occasional lower abdominal pain which is relieved with a bowel movement. Denies any other upper or lower GI symptoms.  Past Medical History  Diagnosis Date  . Arthritis   . Asthma   . Osteoporosis   . DVT (deep venous thrombosis) 2011/2012    chronic coumadin, h/o blood clots on coumadin.  . Dementia   . Essential hypertension, benign   . Esophageal reflux    . GERD (gastroesophageal reflux disease)   . Unspecified prolapse of vaginal walls   . A-fib     Past Surgical History  Procedure Laterality Date  . Abdominal hysterectomy    . Nasal septum surgery    . Tonsillectomy    . Colonoscopy w/ biopsies  02/01/2004    RMR: Anal papilla with normal rectum/Sigmoid diverticula/Small polyps in the cecum removed as described above. Inflammatory polyp  . Wrist fracture surgery      Current Outpatient Prescriptions  Medication Sig Dispense Refill  . acetaminophen (TYLENOL) 500 MG tablet Take 1,000 mg by mouth 2 (two) times daily as needed for mild pain, moderate pain or headache.     . Calcium Carbonate-Vitamin D (CALTRATE 600+D PO) Take 1 tablet by mouth daily.    . Cholecalciferol (D3-1000 PO) Take 1,000 mg by mouth daily.     . DULoxetine (CYMBALTA) 60 MG capsule Take 60 mg by mouth daily.     Marland Kitchen esomeprazole (NEXIUM) 40 MG capsule Take 40 mg by mouth daily.     . Fluticasone-Salmeterol (ADVAIR) 250-50 MCG/DOSE AEPB Inhale 1 puff into the lungs daily.     Marland Kitchen lidocaine (LIDODERM) 5 % Place 1 patch onto the skin daily as needed (for pain). Remove & Discard patch within 12 hours or as directed by MD    . metoprolol tartrate (LOPRESSOR) 25 MG tablet Take 1.5 tablets (37.5 mg total) by mouth 2 (two) times daily. 90 tablet 6  . pseudoephedrine-guaifenesin (MUCINEX D) 60-600 MG per tablet Take 1  tablet by mouth every 12 (twelve) hours.    . pyridOXINE (VITAMIN B-6) 100 MG tablet Take 100 mg by mouth daily.    . vitamin B-12 (CYANOCOBALAMIN) 1000 MCG tablet Take 1,000 mcg by mouth daily.    Marland Kitchen warfarin (COUMADIN) 5 MG tablet Take 5 mg by mouth every evening. Takes 2.5 mg on Sunday, Tuesday Thursday Takes 5 mg on the other days MANAGED BY PMD.    . oxybutynin (DITROPAN-XL) 5 MG 24 hr tablet Take 1 tablet (5 mg total) by mouth at bedtime. (Patient not taking: Reported on 10/04/2014) 30 tablet 11   No current facility-administered medications for this  visit.    Allergies as of 10/13/2014 - Review Complete 10/13/2014  Allergen Reaction Noted  . Latex Itching and Rash 10/20/2013  . Adhesive [tape] Other (See Comments) 10/20/2013  . Ciprofloxacin Nausea And Vomiting 10/20/2013  . Codeine Nausea And Vomiting 10/20/2013  . Penicillins Hives 10/20/2013  . Sulfa antibiotics Hives 10/20/2013    Family History  Problem Relation Age of Onset  . Colon polyps Neg Hx   . Colon cancer Neg Hx     History   Social History  . Marital Status: Married    Spouse Name: N/A    Number of Children: N/A  . Years of Education: N/A   Social History Main Topics  . Smoking status: Never Smoker   . Smokeless tobacco: None  . Alcohol Use: No  . Drug Use: No  . Sexual Activity: Not Currently    Birth Control/ Protection: None   Other Topics Concern  . None   Social History Narrative    Review of Systems: Gen: Denies fever, chills, anorexia, weight loss.  CV: Denies chest pain, palpitations, peripheral edema. Resp: Denies dyspnea at rest, cough, wheezing, or difficulty laying flat. GI: See HPI. Derm: Denies rash, itching, dry skin Psych: Denies depression, anxiety, memory loss, confusion. Heme: Denies bruising, bleeding, and enlarged lymph nodes.  Physical Exam: BP 134/76 mmHg  Pulse 65  Temp(Src) 98.1 F (36.7 C) (Oral)  Ht 5\' 2"  (1.575 m)  Wt 139 lb 3.2 oz (63.141 kg)  BMI 25.45 kg/m2 General:   Alert and oriented. No distress noted. Pleasant and cooperative.   Head:  Normocephalic and atraumatic. Eyes:  Conjuctiva clear without scleral icterus. Mouth:  Oral mucosa pink and moist. No OP edema. Neck:  Supple, without mass or thyromegaly. Heart:  S1, S2 present without murmurs, rubs, or gallops. Regular rate and rhythm. Abdomen:  +BS, soft, and non-distended. Mild epigastric TTP. No HSM or masses noted. Pulses:  2+ DP noted bilaterally Extremities:  Without edema. Neurologic:  Alert and  oriented x4;  grossly normal  neurologically. Skin:  Intact without significant lesions or rashes. Cervical Nodes:  No significant cervical adenopathy. Psych:  Alert and cooperative. Normal mood and affect.    10/13/2014 2:08 PM

## 2014-10-13 NOTE — Assessment & Plan Note (Signed)
Occasional GERD symptoms currently on Nexium. Will have an EGD to evaluate dysphagia and can check for esophagitis/gastritis at that point. May need to adjust PPI based on those results.  Proceed with EGD with possible dilation with Dr. Gala Romney in near future: the risks, benefits, and alternatives have been discussed with the patient in detail. The patient states understanding and desires to proceed.

## 2014-10-13 NOTE — Telephone Encounter (Signed)
I have faxed a letter to Dr. Anastasio Champion to advise about managing coumadin for pt to have an EGD.

## 2014-10-13 NOTE — Assessment & Plan Note (Signed)
Continues to have esophageal dysphagia. BPE shows sticking of barium pill with symptoms reproduction.  CT with no evidence of compression. Patient is wanting an EGD and possible dilation which has worked for her in the past. Will contact PCP who appears to manage coumadin for DVT history to request preferences for bridging/not bridging.  Proceed with EGD with possible dilation with Dr. Gala Romney in near future: the risks, benefits, and alternatives have been discussed with the patient in detail. The patient states understanding and desires to proceed.

## 2014-10-18 NOTE — Telephone Encounter (Signed)
Letter received from Dr. Anastasio Champion. He said pt may come off coumadin for procedure and restart after procedure.  Letter on Sara Garcia's desk!

## 2014-10-18 NOTE — Progress Notes (Signed)
cc'ed to pcp °

## 2014-10-19 NOTE — Telephone Encounter (Signed)
Noted. Please call the patient and have Sara Garcia stop taking Sara Garcia Coumadin for 3 days prior to the procedure. She will get instructions on when to restart as part of Sara Garcia procedure discharge instructions. She should also plan to follow-up with Dr. Anastasio Champion afterward to manage Sara Garcia levels back to theraputic.

## 2014-10-21 NOTE — Telephone Encounter (Signed)
Will forward to [place] to schedule.

## 2014-10-25 NOTE — Telephone Encounter (Signed)
Noted. Please have her follow-up when she's interested in scheduling the procedure.

## 2014-10-25 NOTE — Telephone Encounter (Signed)
Noted  

## 2014-10-25 NOTE — Telephone Encounter (Signed)
Called pt to schedule her EGD possible Dil with RMR.  Pt states she has changed insurance and she doesn't want to schedule procedure at this time.  States she will call us back when she is ready to arrange

## 2014-11-10 ENCOUNTER — Ambulatory Visit: Payer: Commercial Managed Care - HMO | Admitting: Obstetrics & Gynecology

## 2014-12-12 ENCOUNTER — Encounter: Payer: Self-pay | Admitting: Cardiology

## 2014-12-12 ENCOUNTER — Ambulatory Visit (INDEPENDENT_AMBULATORY_CARE_PROVIDER_SITE_OTHER): Payer: Medicare HMO | Admitting: Cardiology

## 2014-12-12 VITALS — BP 125/73 | HR 69 | Ht 62.0 in | Wt 138.0 lb

## 2014-12-12 DIAGNOSIS — I10 Essential (primary) hypertension: Secondary | ICD-10-CM

## 2014-12-12 DIAGNOSIS — I4891 Unspecified atrial fibrillation: Secondary | ICD-10-CM

## 2014-12-12 DIAGNOSIS — Z7901 Long term (current) use of anticoagulants: Secondary | ICD-10-CM

## 2014-12-12 MED ORDER — METOPROLOL TARTRATE 50 MG PO TABS: 50.0000 mg | ORAL_TABLET | Freq: Two times a day (BID) | ORAL | Status: DC

## 2014-12-12 NOTE — Progress Notes (Signed)
Clinical Summary Sara Garcia is a 81 y.o.female seen today for follow up of the following medical problems.  1. Parox afib - on beta blocker for rate control, coumadin for stroke prophylaxis. Denies any bleeding troubles.  - palpitations daily, typically a last few minutes.  - does not drink any caffeine.   2. HTN - does not check at home.  - compliant with meds  3. Hx of DVT - on chronic coumadin, denies any bleeding issues. Followed by Sara Garcia.     Past Medical History  Diagnosis Date  . Arthritis   . Asthma   . Osteoporosis   . DVT (deep venous thrombosis) 2011/2012    chronic coumadin, h/o blood clots on coumadin.  . Dementia   . Essential hypertension, benign   . Esophageal reflux   . GERD (gastroesophageal reflux disease)   . Unspecified prolapse of vaginal walls   . A-fib   . Esophageal dysphagia     limited food and pill     Allergies  Allergen Reactions  . Latex Itching and Rash  . Adhesive [Tape] Other (See Comments)    Tears skin  . Ciprofloxacin Nausea And Vomiting  . Codeine Nausea And Vomiting    Upsets stomach, nightmares  . Penicillins Hives  . Sulfa Antibiotics Hives     Current Outpatient Prescriptions  Medication Sig Dispense Refill  . acetaminophen (TYLENOL) 500 MG tablet Take 1,000 mg by mouth 2 (two) times daily as needed for mild pain, moderate pain or headache.     . Calcium Carbonate-Vitamin D (CALTRATE 600+D PO) Take 1 tablet by mouth daily.    . Cholecalciferol (D3-1000 PO) Take 1,000 mg by mouth daily.     . DULoxetine (CYMBALTA) 60 MG capsule Take 60 mg by mouth daily.     Marland Kitchen esomeprazole (NEXIUM) 40 MG capsule Take 40 mg by mouth daily.     . Fluticasone-Salmeterol (ADVAIR) 250-50 MCG/DOSE AEPB Inhale 1 puff into the lungs daily.     Marland Kitchen lidocaine (LIDODERM) 5 % Place 1 patch onto the skin daily as needed (for pain). Remove & Discard patch within 12 hours or as directed by MD    . metoprolol tartrate (LOPRESSOR) 25 MG tablet  Take 1.5 tablets (37.5 mg total) by mouth 2 (two) times daily. 90 tablet 6  . oxybutynin (DITROPAN-XL) 5 MG 24 hr tablet Take 1 tablet (5 mg total) by mouth at bedtime. (Patient not taking: Reported on 10/04/2014) 30 tablet 11  . pseudoephedrine-guaifenesin (MUCINEX D) 60-600 MG per tablet Take 1 tablet by mouth every 12 (twelve) hours.    . pyridOXINE (VITAMIN B-6) 100 MG tablet Take 100 mg by mouth daily.    . vitamin B-12 (CYANOCOBALAMIN) 1000 MCG tablet Take 1,000 mcg by mouth daily.    Marland Kitchen warfarin (COUMADIN) 5 MG tablet Take 5 mg by mouth every evening. Takes 2.5 mg on Sunday, Tuesday Thursday Takes 5 mg on the other days MANAGED BY PMD.     No current facility-administered medications for this visit.     Past Surgical History  Procedure Laterality Date  . Abdominal hysterectomy    . Nasal septum surgery    . Tonsillectomy    . Colonoscopy w/ biopsies  02/01/2004    RMR: Anal papilla with normal rectum/Sigmoid diverticula/Small polyps in the cecum removed as described above. Inflammatory polyp  . Wrist fracture surgery    . Esophagogastroduodenoscopy      unknown year per patient  . Esophageal  dilation      unknown year per patient.     Allergies  Allergen Reactions  . Latex Itching and Rash  . Adhesive [Tape] Other (See Comments)    Tears skin  . Ciprofloxacin Nausea And Vomiting  . Codeine Nausea And Vomiting    Upsets stomach, nightmares  . Penicillins Hives  . Sulfa Antibiotics Hives      Family History  Problem Relation Age of Onset  . Colon polyps Neg Hx   . Colon cancer Neg Hx      Social History Sara Garcia reports that she has never smoked. She does not have any smokeless tobacco history on file. Sara Garcia reports that she does not drink alcohol.   Review of Systems CONSTITUTIONAL: No weight loss, fever, chills, weakness or fatigue.  HEENT: Eyes: No visual loss, blurred vision, double vision or yellow sclerae.No hearing loss, sneezing, congestion, runny  nose or sore throat.  SKIN: No rash or itching.  CARDIOVASCULAR: per HPI RESPIRATORY: No shortness of breath, cough or sputum.  GASTROINTESTINAL: No anorexia, nausea, vomiting or diarrhea. No abdominal pain or blood.  GENITOURINARY: No burning on urination, no polyuria NEUROLOGICAL: No headache, dizziness, syncope, paralysis, ataxia, numbness or tingling in the extremities. No change in bowel or bladder control.  MUSCULOSKELETAL: No muscle, back pain, joint pain or stiffness.  LYMPHATICS: No enlarged nodes. No history of splenectomy.  PSYCHIATRIC: No history of depression or anxiety.  ENDOCRINOLOGIC: No reports of sweating, cold or heat intolerance. No polyuria or polydipsia.  Marland Kitchen   Physical Examination p 69 bp 125/73 Wt 138 lbs BMI 25 Gen: resting comfortably, no acute distress HEENT: no scleral icterus, pupils equal round and reactive, no palptable cervical adenopathy,  CV: RRR, no m/r/g, no JVD Resp: Clear to auscultation bilaterally GI: abdomen is soft, non-tender, non-distended, normal bowel sounds, no hepatosplenomegaly MSK: extremities are warm, no edema.  Skin: warm, no rash Neuro:  no focal deficits Psych: appropriate affect   Diagnostic Studies 12/2013 Lexicscan MPI Impression  Exercise Capacity: Lexiscan with no exercise.  BP Response: Normal blood pressure response.  Clinical Symptoms: Nausea  ECG Impression: No significant ST segment change suggestive of ischemia.  Comparison with Prior Nuclear Study: No previous nuclear study performed  Overall Impression: Normal stress nuclear study.  LV Ejection Fraction: 85%. LV Wall Motion: NL LV Function; NL Wall Motion    Jan 2015 echo Study Conclusions  - Study data: Technically difficult study. - Left ventricle: The cavity size was normal. Wall thickness was increased in a pattern of mild LVH. There is mild basal septal hypertrophy without obstructive gradient. Systolic function was normal. The estimated  ejection fraction was in the range of 60% to 65%. Doppler parameters are consistent with abnormal left ventricular relaxation (grade 1 diastolic dysfunction). - Aortic valve: Mildly calcified annulus. Trileaflet; mildly thickened leaflets. Trivial regurgitation. Valve area: 1.69cm^2(VTI). Valve area: 1.79cm^2 (Vmax). - Mitral valve: Mildly calcified annulus. Mildly thickened leaflets .      Assessment and Plan   1. Parox afib -reports some frequent palpitations, will increase lopressor to 50mg  bid.  - continue coumadin  2. HTN - at goal, continue current meds  3. Hx of DVT - continue coumadin    F/u 6 months  Arnoldo Lenis, M.D.

## 2014-12-12 NOTE — Patient Instructions (Signed)
   Increase Lopressor to 50mg  twice a day  - new 90 day supply sent to [facility] today. Continue all other medications.   Your physician wants you to follow up in: 6 months.  You will receive a reminder letter in the mail one-two months in advance.  If you don't receive a letter, please call our office to schedule the follow up appointment

## 2014-12-15 ENCOUNTER — Encounter: Payer: Self-pay | Admitting: Internal Medicine

## 2014-12-22 ENCOUNTER — Telehealth: Payer: Self-pay | Admitting: Cardiology

## 2014-12-22 NOTE — Telephone Encounter (Signed)
Patient walked into office asking for Dr Harl Bowie to let her know about the medication changes that that he made at her lat visit.  She would like to know if they are doing what it is suppose to be doing.

## 2014-12-22 NOTE — Telephone Encounter (Signed)
Pt saying since increase of Lopressor at LOV feeling more of the "fluttering" than before along with being agitated and tired. Pt wanting to know what the increase was for explained that increase was for palatations. Will forward to Dr. Harl Bowie for further suggestions.

## 2014-12-23 NOTE — Telephone Encounter (Signed)
She can go back to her previous dose of 37.5mg  bid of lopressor. The higher dose was to help with palpitations. The higher dose would not make her palpitations worst but could cause some of the fatigue she feels. Have her update [place] next week how she is doing back on her previous dose   Zandra Abts MD

## 2014-12-26 MED ORDER — METOPROLOL TARTRATE 25 MG PO TABS: 37.5000 mg | ORAL_TABLET | Freq: Two times a day (BID) | ORAL | Status: DC

## 2014-12-26 NOTE — Telephone Encounter (Signed)
Can add on her one afternoon this week in [place] or my earliest available in [place] Caster MD

## 2014-12-26 NOTE — Telephone Encounter (Signed)
Pt scheduled for 1pm 12/27/14 in [place]

## 2014-12-26 NOTE — Telephone Encounter (Signed)
Lopressor changed back to 37.5 BID. Pt called 911 Saturday and they did not recommend transport to ED after checking vitals (pt does not remember what BP/HR was) pt called because she says she was "feeling funny" and knows something is not right. Pt wanting to know if she should been seen by provider before August when she is due to come back. Will forward to Dr. Harl Bowie

## 2014-12-26 NOTE — Addendum Note (Signed)
Addended by: Julian Hy T on: 12/26/2014 09:53 AM   Modules accepted: Orders, Medications

## 2014-12-27 ENCOUNTER — Encounter: Payer: Self-pay | Admitting: Cardiology

## 2014-12-27 ENCOUNTER — Ambulatory Visit (INDEPENDENT_AMBULATORY_CARE_PROVIDER_SITE_OTHER): Payer: Medicare HMO | Admitting: Cardiology

## 2014-12-27 VITALS — BP 144/84 | HR 57 | Ht 62.0 in | Wt 138.0 lb

## 2014-12-27 DIAGNOSIS — I482 Chronic atrial fibrillation, unspecified: Secondary | ICD-10-CM

## 2014-12-27 DIAGNOSIS — R5383 Other fatigue: Secondary | ICD-10-CM

## 2014-12-27 DIAGNOSIS — R002 Palpitations: Secondary | ICD-10-CM

## 2014-12-27 NOTE — Patient Instructions (Signed)
Your physician recommends that you schedule a follow-up appointment in: 1 month in [place] with [place]   Your physician recommends that you continue on your current medications as directed. Please refer to the Current Medication list given to you today.   Please get lab work done :CMET,CBC,TSH,Magnesium,Lipids, A1C,   FASTING   Your physician has recommended that you wear a holter monitor for 24 hrs. Holter monitors are medical devices that record the heart's electrical activity. Doctors most often use these monitors to diagnose arrhythmias. Arrhythmias are problems with the speed or rhythm of the heartbeat. The monitor is a small, portable device. You can wear one while you do your normal daily activities. This is usually used to diagnose what is causing palpitations/syncope (passing out).    Thank you for choosing [facility] !

## 2014-12-27 NOTE — Progress Notes (Signed)
Clinical Summary Sara Garcia is a 81 y.o.female seen today for follow up of the following medical problems. This is a focused visit on symptoms of fatigue and palpitations.   1. Parox afib - on beta blocker for rate control, coumadin for stroke prophylaxis. Denies any bleeding troubles.  - denies any recent palpitations - last visit we had increased her lopressor to 50mg  bid from 37.5 mg bid. With change she reported worsening fluttering in her chest and fatigue, we cut dose back to 37.5mg  bid.   2. Fatigue -  Ongoing for a few months. Feels sleepy/tired. No SOB, no chest pain. Denies any recent palpitations. Called EMS a few days ago. Head was feeling heavy, felt dizzy at that time. No palpitations. Vitals were checked at that time and were normal.  Past Medical History  Diagnosis Date  . Arthritis   . Asthma   . Osteoporosis   . DVT (deep venous thrombosis) 2011/2012    chronic coumadin, h/o blood clots on coumadin.  . Dementia   . Essential hypertension, benign   . Esophageal reflux   . GERD (gastroesophageal reflux disease)   . Unspecified prolapse of vaginal walls   . A-fib   . Esophageal dysphagia     limited food and pill     Allergies  Allergen Reactions  . Latex Itching and Rash  . Adhesive [Tape] Other (See Comments)    Tears skin  . Ciprofloxacin Nausea And Vomiting  . Codeine Nausea And Vomiting    Upsets stomach, nightmares  . Penicillins Hives  . Sulfa Antibiotics Hives     Current Outpatient Prescriptions  Medication Sig Dispense Refill  . acetaminophen (TYLENOL) 500 MG tablet Take 1,000 mg by mouth 2 (two) times daily as needed for mild pain, moderate pain or headache.     . Calcium Carbonate-Vitamin D (CALTRATE 600+D PO) Take 1 tablet by mouth daily.    . Cholecalciferol (VITAMIN D3) 400 UNITS CAPS Take 1,200 Units by mouth daily.    . DULoxetine (CYMBALTA) 60 MG capsule Take 60 mg by mouth daily.     Marland Kitchen esomeprazole (NEXIUM) 40 MG capsule Take 40  mg by mouth daily.     . Fluticasone-Salmeterol (ADVAIR) 250-50 MCG/DOSE AEPB Inhale 1 puff into the lungs daily.     . metoprolol tartrate (LOPRESSOR) 25 MG tablet Take 1.5 tablets (37.5 mg total) by mouth 2 (two) times daily. 180 tablet 3  . pyridOXINE (VITAMIN B-6) 50 MG tablet Take 50 mg by mouth daily.    . vitamin B-12 (CYANOCOBALAMIN) 1000 MCG tablet Take 1,000 mcg by mouth daily.    Marland Kitchen warfarin (COUMADIN) 5 MG tablet Take 5 mg by mouth every evening. Takes 2.5 mg on Sunday, Tuesday Thursday Takes 5 mg on the other days MANAGED BY PMD.     No current facility-administered medications for this visit.     Past Surgical History  Procedure Laterality Date  . Abdominal hysterectomy    . Nasal septum surgery    . Tonsillectomy    . Colonoscopy w/ biopsies  02/01/2004    RMR: Anal papilla with normal rectum/Sigmoid diverticula/Small polyps in the cecum removed as described above. Inflammatory polyp  . Wrist fracture surgery    . Esophagogastroduodenoscopy      unknown year per patient  . Esophageal dilation      unknown year per patient.     Allergies  Allergen Reactions  . Latex Itching and Rash  . Adhesive [Tape] Other (  See Comments)    Tears skin  . Ciprofloxacin Nausea And Vomiting  . Codeine Nausea And Vomiting    Upsets stomach, nightmares  . Penicillins Hives  . Sulfa Antibiotics Hives      Family History  Problem Relation Age of Onset  . Colon polyps Neg Hx   . Colon cancer Neg Hx      Social History Sara Garcia reports that she has never smoked. She has never used smokeless tobacco. Sara Garcia reports that she does not drink alcohol.   Review of Systems CONSTITUTIONAL: +fatigue HEENT: Eyes: No visual loss, blurred vision, double vision or yellow sclerae.No hearing loss, sneezing, congestion, runny nose or sore throat.  SKIN: No rash or itching.  CARDIOVASCULAR: no chest pain RESPIRATORY: No shortness of breath, cough or sputum.  GASTROINTESTINAL: No  anorexia, nausea, vomiting or diarrhea. No abdominal pain or blood.  GENITOURINARY: No burning on urination, no polyuria NEUROLOGICAL: No headache, dizziness, syncope, paralysis, ataxia, numbness or tingling in the extremities. No change in bowel or bladder control.  MUSCULOSKELETAL: No muscle, back pain, joint pain or stiffness.  LYMPHATICS: No enlarged nodes. No history of splenectomy.  PSYCHIATRIC: No history of depression or anxiety.  ENDOCRINOLOGIC: No reports of sweating, cold or heat intolerance. No polyuria or polydipsia.  Marland Kitchen   Physical Examination Gen: resting comfortably, no acute distress HEENT: no scleral icterus, pupils equal round and reactive, no palptable cervical adenopathy,  CV: RRR, no m/r/g, no JVD Resp: Clear to auscultation bilaterally GI: abdomen is soft, non-tender, non-distended, normal bowel sounds, no hepatosplenomegaly MSK: extremities are warm, no edema.  Skin: warm, no rash Neuro:  no focal deficits Psych: appropriate affect   Diagnostic Studies 12/2013 Lexicscan MPI Impression  Exercise Capacity: Lexiscan with no exercise.  BP Response: Normal blood pressure response.  Clinical Symptoms: Nausea  ECG Impression: No significant ST segment change suggestive of ischemia.  Comparison with Prior Nuclear Study: No previous nuclear study performed  Overall Impression: Normal stress nuclear study.  LV Ejection Fraction: 85%. LV Wall Motion: NL LV Function; NL Wall Motion    Jan 2015 echo Study Conclusions  - Study data: Technically difficult study. - Left ventricle: The cavity size was normal. Wall thickness was increased in a pattern of mild LVH. There is mild basal septal hypertrophy without obstructive gradient. Systolic function was normal. The estimated ejection fraction was in the range of 60% to 65%. Doppler parameters are consistent with abnormal left ventricular relaxation (grade 1 diastolic dysfunction). - Aortic valve: Mildly  calcified annulus. Trileaflet; mildly thickened leaflets. Trivial regurgitation. Valve area: 1.69cm^2(VTI). Valve area: 1.79cm^2 (Vmax). - Mitral valve: Mildly calcified annulus. Mildly thickened leaflets .    Assessment and Plan  1. Parox afib -did not tolerate lopressor 50 mg bid, decreased back to 37.5mg  bid.  - continue coumadin  2. Fatigue -unclear cause. Will send off labs, obtain 24 hr monitor - if persists, consider changing lopressor to diltiazem.     Arnoldo Lenis, M.D.

## 2014-12-28 ENCOUNTER — Ambulatory Visit (INDEPENDENT_AMBULATORY_CARE_PROVIDER_SITE_OTHER): Payer: Medicare HMO | Admitting: *Deleted

## 2014-12-28 DIAGNOSIS — R002 Palpitations: Secondary | ICD-10-CM

## 2014-12-28 LAB — LIPID PANEL
Cholesterol: 189 mg/dL (ref 0–200)
HDL: 60 mg/dL (ref >=46)
LDL Cholesterol: 104 mg/dL — ABNORMAL HIGH (ref 0–99)
Total CHOL/HDL Ratio: 3.2 Ratio
Triglycerides: 126 mg/dL (ref <150)
VLDL: 25 mg/dL (ref 0–40)

## 2014-12-28 LAB — COMPREHENSIVE METABOLIC PANEL
ALT: 14 U/L (ref 0–35)
AST: 20 U/L (ref 0–37)
Albumin: 3.7 g/dL (ref 3.5–5.2)
Alkaline Phosphatase: 60 U/L (ref 39–117)
BUN: 22 mg/dL (ref 6–23)
CO2: 27 mEq/L (ref 19–32)
Calcium: 8.9 mg/dL (ref 8.4–10.5)
Chloride: 104 mEq/L (ref 96–112)
Creat: 0.89 mg/dL (ref 0.50–1.10)
Glucose, Bld: 87 mg/dL (ref 70–99)
Potassium: 4.7 mEq/L (ref 3.5–5.3)
Sodium: 140 mEq/L (ref 135–145)
Total Bilirubin: 0.9 mg/dL (ref 0.2–1.2)
Total Protein: 6.6 g/dL (ref 6.0–8.3)

## 2014-12-28 LAB — CBC
HCT: 39.5 % (ref 36.0–46.0)
Hemoglobin: 13 g/dL (ref 12.0–15.0)
MCH: 28.8 pg (ref 26.0–34.0)
MCHC: 32.9 g/dL (ref 30.0–36.0)
MCV: 87.4 fL (ref 78.0–100.0)
MPV: 9.5 fL (ref 8.6–12.4)
Platelets: 296 10*3/uL (ref 150–400)
RBC: 4.52 MIL/uL (ref 3.87–5.11)
RDW: 14.8 % (ref 11.5–15.5)
WBC: 8.3 10*3/uL (ref 4.0–10.5)

## 2014-12-28 LAB — HEMOGLOBIN A1C
Hgb A1c MFr Bld: 6 % — ABNORMAL HIGH (ref <5.7)
Mean Plasma Glucose: 126 mg/dL — ABNORMAL HIGH (ref <117)

## 2014-12-28 LAB — MAGNESIUM: Magnesium: 2.1 mg/dL (ref 1.5–2.5)

## 2014-12-28 LAB — TSH: TSH: 1.283 u[IU]/mL (ref 0.350–4.500)

## 2015-01-03 ENCOUNTER — Encounter: Payer: Self-pay | Admitting: Obstetrics & Gynecology

## 2015-01-03 ENCOUNTER — Ambulatory Visit (INDEPENDENT_AMBULATORY_CARE_PROVIDER_SITE_OTHER): Payer: Medicare HMO | Admitting: Obstetrics & Gynecology

## 2015-01-03 VITALS — BP 148/80 | HR 68 | Wt 138.0 lb

## 2015-01-03 DIAGNOSIS — N993 Prolapse of vaginal vault after hysterectomy: Secondary | ICD-10-CM | POA: Diagnosis not present

## 2015-01-03 NOTE — Progress Notes (Signed)
Patient ID: [name], female   DOB: 1928/09/28, 81 y.o.   MRN: [id number]      GWENITH TSCHIDA presents today for routine follow up related to her pessary.   She uses a milex ring with support #2 She reports no vaginal discharge or vaginal bleeding.  Exam reveals no undue vaginal mucosal pressure of breakdown, little discharge and no vaginal bleeding.  The pessary is removed, cleaned and replaced without difficulty.    Vira L Stoiber will be sen back in 3 months for continued follow up.  Florian Buff 01/03/2015 3:32 PM

## 2015-01-06 ENCOUNTER — Telehealth: Payer: Self-pay | Admitting: *Deleted

## 2015-01-06 NOTE — Telephone Encounter (Signed)
Message left on voice mail - checking on medication for me & my husband - Sara Garcia.  Attempted to return call - left message.

## 2015-01-10 MED ORDER — METOPROLOL TARTRATE 25 MG PO TABS: 37.5000 mg | ORAL_TABLET | Freq: Two times a day (BID) | ORAL | Status: DC

## 2015-01-10 NOTE — Telephone Encounter (Signed)
Reviewed medication list & informed patient on her Metoprolol dose that she was questioning.  Sent new rx to [facility] at patient request as she does not use mail order.  Medicare Rx taken out of pharmacy selection.

## 2015-01-16 ENCOUNTER — Encounter: Payer: Self-pay | Admitting: Gastroenterology

## 2015-01-16 ENCOUNTER — Ambulatory Visit (INDEPENDENT_AMBULATORY_CARE_PROVIDER_SITE_OTHER): Payer: Medicare HMO | Admitting: Gastroenterology

## 2015-01-16 VITALS — BP 118/74 | HR 101 | Temp 98.3°F | Ht 62.0 in | Wt 139.8 lb

## 2015-01-16 DIAGNOSIS — K5900 Constipation, unspecified: Secondary | ICD-10-CM

## 2015-01-16 DIAGNOSIS — R1314 Dysphagia, pharyngoesophageal phase: Secondary | ICD-10-CM | POA: Diagnosis not present

## 2015-01-16 DIAGNOSIS — R1310 Dysphagia, unspecified: Secondary | ICD-10-CM

## 2015-01-16 DIAGNOSIS — R1319 Other dysphagia: Secondary | ICD-10-CM

## 2015-01-16 MED ORDER — LINACLOTIDE 145 MCG PO CAPS: 145.0000 ug | ORAL_CAPSULE | Freq: Every day | ORAL | Status: DC

## 2015-01-16 MED ORDER — RESTORA PO CAPS: 1.0000 | ORAL_CAPSULE | Freq: Every day | ORAL | Status: DC

## 2015-01-16 NOTE — Progress Notes (Signed)
Primary Care Physician: Doree Albee, MD  Primary Gastroenterologist:  Garfield Cornea, MD   Chief Complaint  Patient presents with  . Follow-up    HPI: Sara Garcia is a 81 y.o. female here for follow-up. Last seen in December 2015. BPE on 11/24 showed brief sticking of the 13 mm barium tablet at the level of the aortic arch with symptoms reproduction but no visible stricture. CT on 12/15 showed no evidence of aortic aneurysm with compression upon the esophagus. Recommendations at that time were to chew food well and have adequate fluids on hand to assist with swallowing and if continued symptoms despite this could consider EGD with empiric dilation.  Swallowing issues no better. Has to concentrate on eating or gets choked. Most pills go down okay, sometimes problems with big pills. No issues with liquids. Lots of belching. BM was having one every day until recently. Changed eating habits/menus. Under a lot of stress. Appetite comes and goes. Eating more than normal, "stress eating". Denies heartburn. No abdominal pain. No melena, brbpr. Complains of incomplete fecal evacuation. Feels backed up.  No anemia on recent blood work.     Current Outpatient Prescriptions  Medication Sig Dispense Refill  . acetaminophen (TYLENOL) 500 MG tablet Take 1,000 mg by mouth 2 (two) times daily as needed for mild pain, moderate pain or headache.     . Calcium Carbonate-Vitamin D (CALTRATE 600+D PO) Take 1 tablet by mouth daily.    . Cholecalciferol (VITAMIN D3) 400 UNITS CAPS Take 1,200 Units by mouth daily.    . DULoxetine (CYMBALTA) 60 MG capsule Take 60 mg by mouth daily.     Marland Kitchen esomeprazole (NEXIUM) 40 MG capsule Take 40 mg by mouth daily.     . Fluticasone-Salmeterol (ADVAIR) 250-50 MCG/DOSE AEPB Inhale 1 puff into the lungs daily.     . Lido-Capsaicin-Men-Methyl Sal 0.5-0.035-5-20 % PTCH Apply topically.    . metoprolol tartrate (LOPRESSOR) 25 MG tablet Take 1.5 tablets (37.5 mg total) by  mouth 2 (two) times daily. 90 tablet 6  . pyridOXINE (VITAMIN B-6) 50 MG tablet Take 50 mg by mouth daily.    . vitamin B-12 (CYANOCOBALAMIN) 1000 MCG tablet Take 1,000 mcg by mouth daily.    Marland Kitchen warfarin (COUMADIN) 5 MG tablet Take 5 mg by mouth every evening. Takes 2.5 mg on Sunday, Tuesday Thursday Takes 5 mg on the other days MANAGED BY PMD.     No current facility-administered medications for this visit.    Allergies as of 01/16/2015 - Review Complete 01/16/2015  Allergen Reaction Noted  . Latex Itching and Rash 10/20/2013  . Adhesive [tape] Other (See Comments) 10/20/2013  . Ciprofloxacin Nausea And Vomiting 10/20/2013  . Codeine Nausea And Vomiting 10/20/2013  . Penicillins Hives 10/20/2013  . Sulfa antibiotics Hives 10/20/2013   Past Medical History  Diagnosis Date  . Arthritis   . Asthma   . Osteoporosis   . DVT (deep venous thrombosis) 2011/2012    chronic coumadin, h/o blood clots on coumadin.  . Dementia   . Essential hypertension, benign   . Esophageal reflux   . GERD (gastroesophageal reflux disease)   . Unspecified prolapse of vaginal walls   . A-fib   . Esophageal dysphagia     limited food and pill   Past Surgical History  Procedure Laterality Date  . Abdominal hysterectomy    . Nasal septum surgery    . Tonsillectomy    . Colonoscopy w/ biopsies  02/01/2004  RMR: Anal papilla with normal rectum/Sigmoid diverticula/Small polyps in the cecum removed as described above. Inflammatory polyp  . Wrist fracture surgery    . Esophagogastroduodenoscopy      unknown year per patient  . Esophageal dilation      unknown year per patient.    ROS:  General: Negative for anorexia, weight loss, fever, chills, fatigue, weakness. ENT: Negative for hoarseness, nasal congestion. CV: Negative for chest pain, angina, palpitations, dyspnea on exertion, peripheral edema.  Respiratory: Negative for dyspnea at rest, dyspnea on exertion, cough, sputum, wheezing.  GI: See  history of present illness. GU:  Negative for dysuria, hematuria, urinary incontinence, urinary frequency, nocturnal urination.  Endo: Negative for unusual weight change.    Physical Examination:   BP 118/74 mmHg  Pulse 101  Temp(Src) 98.3 F (36.8 C) (Oral)  Ht 5\' 2"  (1.575 m)  Wt 139 lb 12.8 oz (63.413 kg)  BMI 25.56 kg/m2  General: Well-nourished, well-developed in no acute distress.  Eyes: No icterus. Mouth: Oropharyngeal mucosa moist and pink , no lesions erythema or exudate. Lungs: Clear to auscultation bilaterally.  Heart: Regular rate and rhythm, no murmurs rubs or gallops.  Abdomen: Bowel sounds are normal, nontender, nondistended, no hepatosplenomegaly or masses, no abdominal bruits or hernia , no rebound or guarding.   Extremities: No lower extremity edema. No clubbing or deformities. Neuro: Alert and oriented x 4   Skin: Warm and dry, no jaundice.   Psych: Alert and cooperative, normal mood and affect.  Labs:  Lab Results  Component Value Date   WBC 8.3 12/27/2014   HGB 13.0 12/27/2014   HCT 39.5 12/27/2014   MCV 87.4 12/27/2014   PLT 296 12/27/2014   Lab Results  Component Value Date   CREATININE 0.89 12/27/2014   BUN 22 12/27/2014   NA 140 12/27/2014   K 4.7 12/27/2014   CL 104 12/27/2014   CO2 27 12/27/2014   Lab Results  Component Value Date   TSH 1.283 12/27/2014   Lab Results  Component Value Date   HGBA1C 6.0* 12/27/2014   Lab Results  Component Value Date   ALT 14 12/27/2014   AST 20 12/27/2014   ALKPHOS 60 12/27/2014   BILITOT 0.9 12/27/2014    Imaging Studies: No results found.

## 2015-01-16 NOTE — Patient Instructions (Signed)
1. Please start Restora one daily for three weeks (to improve digestion). 2. Start Linzess once daily on empty stomach for constipation. If this is not covered by insurance, have the pharmacy call us and we will try alternative medication.  3. Call in 2-3 weeks with progress report. 4. If you decide to have your esophagus stretched, please let me know.

## 2015-01-17 ENCOUNTER — Telehealth: Payer: Self-pay

## 2015-01-17 MED ORDER — LUBIPROSTONE 8 MCG PO CAPS: 8.0000 ug | ORAL_CAPSULE | Freq: Two times a day (BID) | ORAL | Status: DC

## 2015-01-17 NOTE — Assessment & Plan Note (Signed)
Patient previously postponed EGD/ED due to insurance changes. She is still reluctant due to multiple upcoming expenses. She wants to concentrate on digestive issues right now and will let me know if she decides to move forward on the EGD/ED. Previously her PCP advised to hold coumadin prior to procedure, no need for Lovenox bridging.

## 2015-01-17 NOTE — Telephone Encounter (Signed)
Pt called and states that the linzess is too expensive and needs an alternative medication

## 2015-01-17 NOTE — Telephone Encounter (Signed)
Let's try Amitiza. RX done.

## 2015-01-17 NOTE — Telephone Encounter (Signed)
Pt aware of new RX 

## 2015-01-17 NOTE — Assessment & Plan Note (Signed)
Recent onset constipation in setting of dietary changes. Last colonoscopy 2005. Patient wants to try medication. Advised Linzess 140mcg daily. If no significant improvement, she will let us know.

## 2015-01-18 NOTE — Progress Notes (Signed)
cc'ed to pcp °

## 2015-01-30 NOTE — Telephone Encounter (Signed)
Pt called and wants to proceed with EGD/ E. How many days do we need to hold coumadin?

## 2015-01-31 ENCOUNTER — Ambulatory Visit: Payer: Medicare HMO | Admitting: Cardiology

## 2015-01-31 ENCOUNTER — Other Ambulatory Visit: Payer: Self-pay

## 2015-01-31 DIAGNOSIS — R1314 Dysphagia, pharyngoesophageal phase: Secondary | ICD-10-CM

## 2015-01-31 NOTE — Telephone Encounter (Signed)
OK to schedule EGD/ED with RMR for esophageal dysphagia.  Hold coumadin for four days prior to procedure.

## 2015-01-31 NOTE — Telephone Encounter (Signed)
Pt is scheduled for 02/07/2015 @ 7:30am.  Instructions mailed. And pt is aware

## 2015-02-01 ENCOUNTER — Other Ambulatory Visit (HOSPITAL_COMMUNITY): Payer: Self-pay | Admitting: Internal Medicine

## 2015-02-01 DIAGNOSIS — Z780 Asymptomatic menopausal state: Secondary | ICD-10-CM

## 2015-02-03 ENCOUNTER — Ambulatory Visit (INDEPENDENT_AMBULATORY_CARE_PROVIDER_SITE_OTHER): Payer: Medicare HMO | Admitting: Cardiology

## 2015-02-03 ENCOUNTER — Encounter: Payer: Self-pay | Admitting: Cardiology

## 2015-02-03 VITALS — BP 134/84 | HR 109 | Ht 62.0 in | Wt 140.0 lb

## 2015-02-03 DIAGNOSIS — I4891 Unspecified atrial fibrillation: Secondary | ICD-10-CM

## 2015-02-03 DIAGNOSIS — I10 Essential (primary) hypertension: Secondary | ICD-10-CM

## 2015-02-03 DIAGNOSIS — Z7901 Long term (current) use of anticoagulants: Secondary | ICD-10-CM

## 2015-02-03 DIAGNOSIS — Z86718 Personal history of other venous thrombosis and embolism: Secondary | ICD-10-CM

## 2015-02-03 MED ORDER — APIXABAN 5 MG PO TABS: 5.0000 mg | ORAL_TABLET | Freq: Two times a day (BID) | ORAL | Status: DC

## 2015-02-03 NOTE — Progress Notes (Signed)
Clinical Summary Ms. Merk is a 81 y.o.female seen today for follow up of the following medical problems.   1. Parox afib - on beta blocker for rate control, coumadin for stroke prophylaxis. Denies any bleeding troubles.  - previously we had icreased her lopressor to 50mg  bid from 37.5 mg bid. With change she reported worsening fluttering in her chest and fatigue, we cut dose back to 37.5mg  bid.and she felt better  - no recent palpitations - last visit described episodes of fatigue, wore 24 hr monitor to see if arrhythmia related, monitor without significant abnormalities.   2. Dysphagia - followed by GI, has upcoming EGD  3. HTN - does not check at home.  - compliant with meds  4. Hx of DVT - on chronic coumadin, denies any bleeding issues. Followed by Dr Anastasio Champion. Past Medical History  Diagnosis Date  . Arthritis   . Asthma   . Osteoporosis   . DVT (deep venous thrombosis) 2011/2012    chronic coumadin, h/o blood clots on coumadin.  . Dementia   . Essential hypertension, benign   . Esophageal reflux   . GERD (gastroesophageal reflux disease)   . Unspecified prolapse of vaginal walls   . A-fib   . Esophageal dysphagia     limited food and pill     Allergies  Allergen Reactions  . Latex Itching and Rash  . Adhesive [Tape] Other (See Comments)    Tears skin  . Ciprofloxacin Nausea And Vomiting  . Codeine Nausea And Vomiting    Upsets stomach, nightmares  . Penicillins Hives  . Sulfa Antibiotics Hives     Current Outpatient Prescriptions  Medication Sig Dispense Refill  . acetaminophen (TYLENOL) 500 MG tablet Take 1,000 mg by mouth 2 (two) times daily as needed for mild pain, moderate pain or headache.     . Calcium Carbonate-Vitamin D (CALTRATE 600+D PO) Take 1 tablet by mouth daily.    . Cholecalciferol (VITAMIN D3) 400 UNITS CAPS Take 1,200 Units by mouth daily.    . DULoxetine (CYMBALTA) 60 MG capsule Take 60 mg by mouth daily.     Marland Kitchen esomeprazole  (NEXIUM) 40 MG capsule Take 40 mg by mouth daily.     . Fluticasone-Salmeterol (ADVAIR) 250-50 MCG/DOSE AEPB Inhale 1 puff into the lungs daily.     . Lido-Capsaicin-Men-Methyl Sal 0.5-0.035-5-20 % PTCH Apply topically.    . lubiprostone (AMITIZA) 8 MCG capsule Take 1 capsule (8 mcg total) by mouth 2 (two) times daily with a meal. 60 capsule 3  . metoprolol tartrate (LOPRESSOR) 25 MG tablet Take 1.5 tablets (37.5 mg total) by mouth 2 (two) times daily. 90 tablet 6  . Probiotic Product (RESTORA) CAPS Take 1 capsule by mouth daily. 21 capsule 0  . pyridOXINE (VITAMIN B-6) 50 MG tablet Take 50 mg by mouth daily.    . vitamin B-12 (CYANOCOBALAMIN) 1000 MCG tablet Take 1,000 mcg by mouth daily.    Marland Kitchen warfarin (COUMADIN) 5 MG tablet Take 5 mg by mouth every evening. Takes 2.5 mg on Sunday, Tuesday Thursday Takes 5 mg on the other days MANAGED BY PMD.     No current facility-administered medications for this visit.     Past Surgical History  Procedure Laterality Date  . Abdominal hysterectomy    . Nasal septum surgery    . Tonsillectomy    . Colonoscopy w/ biopsies  02/01/2004    RMR: Anal papilla with normal rectum/Sigmoid diverticula/Small polyps in the cecum removed as  described above. Inflammatory polyp  . Wrist fracture surgery    . Esophagogastroduodenoscopy      unknown year per patient  . Esophageal dilation      unknown year per patient.     Allergies  Allergen Reactions  . Latex Itching and Rash  . Adhesive [Tape] Other (See Comments)    Tears skin  . Ciprofloxacin Nausea And Vomiting  . Codeine Nausea And Vomiting    Upsets stomach, nightmares  . Penicillins Hives  . Sulfa Antibiotics Hives      Family History  Problem Relation Age of Onset  . Colon polyps Neg Hx   . Colon cancer Neg Hx      Social History Ms. Husmann reports that she has never smoked. She has never used smokeless tobacco. Ms. Sze reports that she does not drink alcohol.   Review of  Systems CONSTITUTIONAL: No weight loss, fever, chills, weakness or fatigue.  HEENT: Eyes: No visual loss, blurred vision, double vision or yellow sclerae.No hearing loss, sneezing, congestion, runny nose or sore throat.  SKIN: No rash or itching.  CARDIOVASCULAR: per HPI RESPIRATORY: No shortness of breath, cough or sputum.  GASTROINTESTINAL: No anorexia, nausea, vomiting or diarrhea. No abdominal pain or blood.  GENITOURINARY: No burning on urination, no polyuria NEUROLOGICAL: No headache, dizziness, syncope, paralysis, ataxia, numbness or tingling in the extremities. No change in bowel or bladder control.  MUSCULOSKELETAL: No muscle, back pain, joint pain or stiffness.  LYMPHATICS: No enlarged nodes. No history of splenectomy.  PSYCHIATRIC: No history of depression or anxiety.  ENDOCRINOLOGIC: No reports of sweating, cold or heat intolerance. No polyuria or polydipsia.  Marland Kitchen   Physical Examination p 109 bp 134/84 Wt 140 lbs BMI 26 Gen: resting comfortably, no acute distress HEENT: no scleral icterus, pupils equal round and reactive, no palptable cervical adenopathy,  CV: RRR, no m/r/g, no JVD, no carotid bruits Resp: Clear to auscultation bilaterally GI: abdomen is soft, non-tender, non-distended, normal bowel sounds, no hepatosplenomegaly MSK: extremities are warm, no edema.  Skin: warm, no rash Neuro:  no focal deficits Psych: appropriate affect   Diagnostic Studies 12/2013 Lexicscan MPI Impression  Exercise Capacity: Lexiscan with no exercise.  BP Response: Normal blood pressure response.  Clinical Symptoms: Nausea  ECG Impression: No significant ST segment change suggestive of ischemia.  Comparison with Prior Nuclear Study: No previous nuclear study performed  Overall Impression: Normal stress nuclear study.  LV Ejection Fraction: 85%. LV Wall Motion: NL LV Function; NL Wall Motion    Jan 2015 echo Study Conclusions  - Study data: Technically difficult study. -  Left ventricle: The cavity size was normal. Wall thickness was increased in a pattern of mild LVH. There is mild basal septal hypertrophy without obstructive gradient. Systolic function was normal. The estimated ejection fraction was in the range of 60% to 65%. Doppler parameters are consistent with abnormal left ventricular relaxation (grade 1 diastolic dysfunction). - Aortic valve: Mildly calcified annulus. Trileaflet; mildly thickened leaflets. Trivial regurgitation. Valve area: 1.69cm^2(VTI). Valve area: 1.79cm^2 (Vmax). - Mitral valve: Mildly calcified annulus. Mildly thickened leaflets .   01/2015 Holter monitor No significant arrhythmias    Assessment and Plan  1. Parox afib -did not tolerate lopressor 50 mg bid, decreased back to 37.5mg  bid.  - no recent palpitations - she is interested in starting eliquis. Will stop coumadin and start eliquis 5mg  bid  2. HTN - at goal, continue current meds  3. Hx of DVT - will stop coumadin, start  eliquis.  F/u 6 months    Arnoldo Lenis, M.D.

## 2015-02-03 NOTE — Patient Instructions (Addendum)
   Stop Coumadin   Begin Eliquis 5mg  twice a day  - may start the day after your esophagus procedure.   Printed scripts given today.  One for 30 day free trial offer & one for regular prescription.   Continue all other medications.   Follow up in anticoagulation clinic around Mar 11, 2015 - one month after beginning the Eliquis. Your physician wants you to follow up in: 6 months.  You will receive a reminder letter in the mail one-two months in advance.  If you don't receive a letter, please call our office to schedule the follow up appointment

## 2015-02-06 MED ORDER — APIXABAN 5 MG PO TABS: 5.0000 mg | ORAL_TABLET | Freq: Two times a day (BID) | ORAL | Status: DC

## 2015-02-06 NOTE — Addendum Note (Signed)
Addended by: Laurine Blazer on: 02/06/2015 09:44 AM   Modules accepted: Orders

## 2015-02-07 ENCOUNTER — Encounter (HOSPITAL_COMMUNITY): Payer: Self-pay | Admitting: *Deleted

## 2015-02-07 ENCOUNTER — Encounter (HOSPITAL_COMMUNITY)
Admission: RE | Disposition: A | Discharge status: Home / Self Care | Payer: Self-pay | Source: Ambulatory Visit | Attending: Internal Medicine

## 2015-02-07 ENCOUNTER — Other Ambulatory Visit (HOSPITAL_COMMUNITY): Payer: Medicare HMO

## 2015-02-07 ENCOUNTER — Inpatient Hospital Stay (HOSPITAL_COMMUNITY)
Admission: RE | Admit: 2015-02-07 | Discharge: 2015-02-07 | Disposition: A | Discharge status: Home / Self Care | Payer: Medicare HMO | Source: Ambulatory Visit | Attending: Internal Medicine | Admitting: Internal Medicine

## 2015-02-07 ENCOUNTER — Ambulatory Visit (HOSPITAL_COMMUNITY)
Admission: RE | Admit: 2015-02-07 | Discharge: 2015-02-07 | Disposition: A | Discharge status: Home / Self Care | Payer: Medicare HMO | Source: Ambulatory Visit | Attending: Internal Medicine | Admitting: Internal Medicine

## 2015-02-07 DIAGNOSIS — Z881 Allergy status to other antibiotic agents status: Secondary | ICD-10-CM | POA: Insufficient documentation

## 2015-02-07 DIAGNOSIS — Z86718 Personal history of other venous thrombosis and embolism: Secondary | ICD-10-CM | POA: Diagnosis not present

## 2015-02-07 DIAGNOSIS — Z882 Allergy status to sulfonamides status: Secondary | ICD-10-CM | POA: Insufficient documentation

## 2015-02-07 DIAGNOSIS — I4891 Unspecified atrial fibrillation: Secondary | ICD-10-CM | POA: Diagnosis not present

## 2015-02-07 DIAGNOSIS — R1310 Dysphagia, unspecified: Secondary | ICD-10-CM | POA: Diagnosis present

## 2015-02-07 DIAGNOSIS — K449 Diaphragmatic hernia without obstruction or gangrene: Secondary | ICD-10-CM | POA: Insufficient documentation

## 2015-02-07 DIAGNOSIS — I10 Essential (primary) hypertension: Secondary | ICD-10-CM | POA: Insufficient documentation

## 2015-02-07 DIAGNOSIS — Z91040 Latex allergy status: Secondary | ICD-10-CM | POA: Insufficient documentation

## 2015-02-07 DIAGNOSIS — K219 Gastro-esophageal reflux disease without esophagitis: Secondary | ICD-10-CM | POA: Insufficient documentation

## 2015-02-07 DIAGNOSIS — J45909 Unspecified asthma, uncomplicated: Secondary | ICD-10-CM | POA: Insufficient documentation

## 2015-02-07 DIAGNOSIS — R1314 Dysphagia, pharyngoesophageal phase: Secondary | ICD-10-CM | POA: Diagnosis not present

## 2015-02-07 DIAGNOSIS — F0390 Unspecified dementia without behavioral disturbance: Secondary | ICD-10-CM | POA: Diagnosis not present

## 2015-02-07 DIAGNOSIS — Z7901 Long term (current) use of anticoagulants: Secondary | ICD-10-CM | POA: Insufficient documentation

## 2015-02-07 DIAGNOSIS — Z880 Allergy status to penicillin: Secondary | ICD-10-CM | POA: Diagnosis not present

## 2015-02-07 DIAGNOSIS — M1990 Unspecified osteoarthritis, unspecified site: Secondary | ICD-10-CM | POA: Diagnosis not present

## 2015-02-07 HISTORY — PX: ESOPHAGOGASTRODUODENOSCOPY: SHX5428

## 2015-02-07 HISTORY — PX: ESOPHAGEAL DILATION: SHX303

## 2015-02-07 SURGERY — EGD (ESOPHAGOGASTRODUODENOSCOPY)
Anesthesia: Moderate Sedation

## 2015-02-07 MED ORDER — LIDOCAINE VISCOUS 2 % MT SOLN
OROMUCOSAL | Status: AC
  Filled 2015-02-07: qty 15

## 2015-02-07 MED ORDER — MEPERIDINE HCL 100 MG/ML IJ SOLN
INTRAMUSCULAR | Status: AC
  Filled 2015-02-07: qty 2

## 2015-02-07 MED ORDER — SODIUM CHLORIDE 0.9 % IV SOLN
INTRAVENOUS | Status: DC
  Administered 2015-02-07: 07:00:00 via INTRAVENOUS

## 2015-02-07 MED ORDER — MIDAZOLAM HCL 5 MG/5ML IJ SOLN
INTRAMUSCULAR | Status: AC
  Filled 2015-02-07: qty 10

## 2015-02-07 MED ORDER — STERILE WATER FOR IRRIGATION IR SOLN
Status: DC | PRN
  Administered 2015-02-07: 08:00:00

## 2015-02-07 MED ORDER — MEPERIDINE HCL 100 MG/ML IJ SOLN
INTRAMUSCULAR | Status: DC | PRN
  Administered 2015-02-07: 25 mg via INTRAVENOUS

## 2015-02-07 MED ORDER — MIDAZOLAM HCL 5 MG/5ML IJ SOLN
INTRAMUSCULAR | Status: DC | PRN
Start: 2015-02-07 — End: 2015-02-07
  Administered 2015-02-07: 1 mg via INTRAVENOUS
  Administered 2015-02-07: 2 mg via INTRAVENOUS

## 2015-02-07 MED ORDER — ONDANSETRON HCL 4 MG/2ML IJ SOLN
INTRAMUSCULAR | Status: DC | PRN
  Administered 2015-02-07: 4 mg via INTRAVENOUS

## 2015-02-07 MED ORDER — LIDOCAINE VISCOUS 2 % MT SOLN
OROMUCOSAL | Status: DC | PRN
  Administered 2015-02-07: 3 mL via OROMUCOSAL

## 2015-02-07 MED ORDER — ONDANSETRON HCL 4 MG/2ML IJ SOLN
INTRAMUSCULAR | Status: AC
  Filled 2015-02-07: qty 2

## 2015-02-07 NOTE — Op Note (Signed)
[facility] [address] [place], [address]   ENDOSCOPY PROCEDURE REPORT  PATIENT: Sara Garcia, Sara Garcia  MR#: [id number] BIRTHDATE: 03-19-1928 , 86  yrs. old GENDER: female ENDOSCOPIST: R.  Garfield Cornea, MD FACP FACG REFERRED BY:  Hurshel Party, M.D. Dr. Harl Bowie PROCEDURE DATE:  02-22-2015 PROCEDURE:  EGD, diagnostic and Maloney dilation of esophagus INDICATIONS:  Recurrent esophageal dysphagia. MEDICATIONS: Versed 3 mg IV and Demerol 25 mg IV in divided doses. Xylocaine gel orally.  Zofran 4 mg IV. ASA CLASS:      Class III  CONSENT: The risks, benefits, limitations, alternatives and imponderables have been discussed.  The potential for biopsy, esophogeal dilation, etc. have also been reviewed.  Questions have been answered.  All parties agreeable.  Please see the history and physical in the medical record for more information.  DESCRIPTION OF PROCEDURE: After the risks benefits and alternatives of the procedure were thoroughly explained, informed consent was obtained.  The EG-[id number] ([id number]) endoscope was introduced through the mouth and advanced to the second portion of the duodenum , limited by Without limitations. The instrument was slowly withdrawn as the mucosa was fully examined.    Normal-appearing, patent tubular esophagus without extrinsic compression or other abnormality.  Stomach empty; Small hiatal hernia.  Normal gastric mucosa.  Patent pylorus.  Normal-appearing first and second portion of the duodenal  Scope was withdrawn and a 54 [place] Maloney dilator was passed to full insertion easily.  A look back revealed a small tear through the UES mucosa; otherwise no apparent complication.  Retroflexed views revealed a hiatal hernia.     The scope was then withdrawn from the patient and the procedure completed.  COMPLICATIONS: There were no immediate complications.  ENDOSCOPIC IMPRESSION: Normal esophagus?"status post Maloney dilation. Small  hiatal hernia.  RECOMMENDATIONS: Continue Nexium 40 mg daily. Risks dwarfed by the benefits as discussed with daughter. See Dr. Harl Bowie regarding abnormal heartbeat documented during today's encounter. See the dentist to improve fitting of false teeth. Office visit with [place] in 3 months.    Resume anticoagulation therapy today under the direction of Dr. Harl Bowie  REPEAT EXAM:  eSigned:  R. Garfield Cornea, MD Rosalita Chessman [facility] 02/22/2015 8:23 AM    CC:  CPT CODES: ICD CODES:  The ICD and CPT codes recommended by this software are interpretations from the data that the clinical staff has captured with the software.  The verification of the translation of this report to the ICD and CPT codes and modifiers is the sole responsibility of the health care institution and practicing physician where this report was generated.  [employer] will not be held responsible for the validity of the ICD and CPT codes included on this report.  AMA assumes no liability for data contained or not contained herein. CPT is a Designer, television/film set of the [employer].  PATIENT NAME:  Sara Garcia, Sara Garcia MR#: [id number]

## 2015-02-07 NOTE — Interval H&P Note (Signed)
History and Physical Interval Note:  02/07/2015 7:46 AM  Sara Garcia  has presented today for surgery, with the diagnosis of dysphagia  The various methods of treatment have been discussed with the patient and family. After consideration of risks, benefits and other options for treatment, the patient has consented to  Procedure(s) with comments: ESOPHAGOGASTRODUODENOSCOPY (EGD) (N/A) - 730am ESOPHAGEAL DILATION (N/A) as a surgical intervention .  The patient's history has been reviewed, patient examined, no change in status, stable for surgery.  I have reviewed the patient's chart and labs.  Questions were answered to the patient's satisfaction.     Manus Rudd

## 2015-02-07 NOTE — Progress Notes (Signed)
EKG strips sent over from endo from her EGD procedure. SR with PACs, no significant arrhythmias. Continue current meds, she is on metoprolol for afib already.   Zandra Abts MD

## 2015-02-07 NOTE — Discharge Instructions (Signed)
EGD Discharge instructions Please read the instructions outlined below and refer to this sheet in the next few weeks. These discharge instructions provide you with general information on caring for yourself after you leave the hospital. Your doctor may also give you specific instructions. While your treatment has been planned according to the most current medical practices available, unavoidable complications occasionally occur. If you have any problems or questions after discharge, please call your doctor. ACTIVITY  You may resume your regular activity but move at a slower pace for the next 24 hours.   Take frequent rest periods for the next 24 hours.   Walking will help expel (get rid of) the air and reduce the bloated feeling in your abdomen.   No driving for 24 hours (because of the anesthesia (medicine) used during the test).   You may shower.   Do not sign any important legal documents or operate any machinery for 24 hours (because of the anesthesia used during the test).  NUTRITION  Drink plenty of fluids.   You may resume your normal diet.   Begin with a light meal and progress to your normal diet.   Avoid alcoholic beverages for 24 hours or as instructed by your caregiver.  MEDICATIONS  You may resume your normal medications unless your caregiver tells you otherwise.  WHAT YOU CAN EXPECT TODAY  You may experience abdominal discomfort such as a feeling of fullness or gas pains.  FOLLOW-UP  Your doctor will discuss the results of your test with you.  SEEK IMMEDIATE MEDICAL ATTENTION IF ANY OF THE FOLLOWING OCCUR:  Excessive nausea (feeling sick to your stomach) and/or vomiting.   Severe abdominal pain and distention (swelling).   Trouble swallowing.   Temperature over 101 F (37.8 C).   Rectal bleeding or vomiting of blood.   Continue Nexium 40 mg daily  Soft Diet  Resume anticoagulation with either Coumadin or Elliquis today under the direction of Dr.  Harl Bowie  Follow-up with Dr. Harl Bowie regarding irregular heart beat noted today  Office visit with [place] in 3 months  See dentist regarding poorly fitting dentures

## 2015-02-07 NOTE — H&P (View-Only) (Signed)
Primary Care Physician: Doree Albee, MD  Primary Gastroenterologist:  Garfield Cornea, MD   Chief Complaint  Patient presents with  . Follow-up    HPI: Sara Garcia is a 81 y.o. female here for follow-up. Last seen in December 2015. BPE on 11/24 showed brief sticking of the 13 mm barium tablet at the level of the aortic arch with symptoms reproduction but no visible stricture. CT on 12/15 showed no evidence of aortic aneurysm with compression upon the esophagus. Recommendations at that time were to chew food well and have adequate fluids on hand to assist with swallowing and if continued symptoms despite this could consider EGD with empiric dilation.  Swallowing issues no better. Has to concentrate on eating or gets choked. Most pills go down okay, sometimes problems with big pills. No issues with liquids. Lots of belching. BM was having one every day until recently. Changed eating habits/menus. Under a lot of stress. Appetite comes and goes. Eating more than normal, "stress eating". Denies heartburn. No abdominal pain. No melena, brbpr. Complains of incomplete fecal evacuation. Feels backed up.  No anemia on recent blood work.     Current Outpatient Prescriptions  Medication Sig Dispense Refill  . acetaminophen (TYLENOL) 500 MG tablet Take 1,000 mg by mouth 2 (two) times daily as needed for mild pain, moderate pain or headache.     . Calcium Carbonate-Vitamin D (CALTRATE 600+D PO) Take 1 tablet by mouth daily.    . Cholecalciferol (VITAMIN D3) 400 UNITS CAPS Take 1,200 Units by mouth daily.    . DULoxetine (CYMBALTA) 60 MG capsule Take 60 mg by mouth daily.     Marland Kitchen esomeprazole (NEXIUM) 40 MG capsule Take 40 mg by mouth daily.     . Fluticasone-Salmeterol (ADVAIR) 250-50 MCG/DOSE AEPB Inhale 1 puff into the lungs daily.     . Lido-Capsaicin-Men-Methyl Sal 0.5-0.035-5-20 % PTCH Apply topically.    . metoprolol tartrate (LOPRESSOR) 25 MG tablet Take 1.5 tablets (37.5 mg total) by  mouth 2 (two) times daily. 90 tablet 6  . pyridOXINE (VITAMIN B-6) 50 MG tablet Take 50 mg by mouth daily.    . vitamin B-12 (CYANOCOBALAMIN) 1000 MCG tablet Take 1,000 mcg by mouth daily.    Marland Kitchen warfarin (COUMADIN) 5 MG tablet Take 5 mg by mouth every evening. Takes 2.5 mg on Sunday, Tuesday Thursday Takes 5 mg on the other days MANAGED BY PMD.     No current facility-administered medications for this visit.    Allergies as of 01/16/2015 - Review Complete 01/16/2015  Allergen Reaction Noted  . Latex Itching and Rash 10/20/2013  . Adhesive [tape] Other (See Comments) 10/20/2013  . Ciprofloxacin Nausea And Vomiting 10/20/2013  . Codeine Nausea And Vomiting 10/20/2013  . Penicillins Hives 10/20/2013  . Sulfa antibiotics Hives 10/20/2013   Past Medical History  Diagnosis Date  . Arthritis   . Asthma   . Osteoporosis   . DVT (deep venous thrombosis) 2011/2012    chronic coumadin, h/o blood clots on coumadin.  . Dementia   . Essential hypertension, benign   . Esophageal reflux   . GERD (gastroesophageal reflux disease)   . Unspecified prolapse of vaginal walls   . A-fib   . Esophageal dysphagia     limited food and pill   Past Surgical History  Procedure Laterality Date  . Abdominal hysterectomy    . Nasal septum surgery    . Tonsillectomy    . Colonoscopy w/ biopsies  02/01/2004  RMR: Anal papilla with normal rectum/Sigmoid diverticula/Small polyps in the cecum removed as described above. Inflammatory polyp  . Wrist fracture surgery    . Esophagogastroduodenoscopy      unknown year per patient  . Esophageal dilation      unknown year per patient.    ROS:  General: Negative for anorexia, weight loss, fever, chills, fatigue, weakness. ENT: Negative for hoarseness, nasal congestion. CV: Negative for chest pain, angina, palpitations, dyspnea on exertion, peripheral edema.  Respiratory: Negative for dyspnea at rest, dyspnea on exertion, cough, sputum, wheezing.  GI: See  history of present illness. GU:  Negative for dysuria, hematuria, urinary incontinence, urinary frequency, nocturnal urination.  Endo: Negative for unusual weight change.    Physical Examination:   BP 118/74 mmHg  Pulse 101  Temp(Src) 98.3 F (36.8 C) (Oral)  Ht 5\' 2"  (1.575 m)  Wt 139 lb 12.8 oz (63.413 kg)  BMI 25.56 kg/m2  General: Well-nourished, well-developed in no acute distress.  Eyes: No icterus. Mouth: Oropharyngeal mucosa moist and pink , no lesions erythema or exudate. Lungs: Clear to auscultation bilaterally.  Heart: Regular rate and rhythm, no murmurs rubs or gallops.  Abdomen: Bowel sounds are normal, nontender, nondistended, no hepatosplenomegaly or masses, no abdominal bruits or hernia , no rebound or guarding.   Extremities: No lower extremity edema. No clubbing or deformities. Neuro: Alert and oriented x 4   Skin: Warm and dry, no jaundice.   Psych: Alert and cooperative, normal mood and affect.  Labs:  Lab Results  Component Value Date   WBC 8.3 12/27/2014   HGB 13.0 12/27/2014   HCT 39.5 12/27/2014   MCV 87.4 12/27/2014   PLT 296 12/27/2014   Lab Results  Component Value Date   CREATININE 0.89 12/27/2014   BUN 22 12/27/2014   NA 140 12/27/2014   K 4.7 12/27/2014   CL 104 12/27/2014   CO2 27 12/27/2014   Lab Results  Component Value Date   TSH 1.283 12/27/2014   Lab Results  Component Value Date   HGBA1C 6.0* 12/27/2014   Lab Results  Component Value Date   ALT 14 12/27/2014   AST 20 12/27/2014   ALKPHOS 60 12/27/2014   BILITOT 0.9 12/27/2014    Imaging Studies: No results found.

## 2015-02-07 NOTE — Interval H&P Note (Signed)
History and Physical Interval Note:  02/07/2015 7:51 AM  Sara Garcia  has presented today for surgery, with the diagnosis of dysphagia  The various methods of treatment have been discussed with the patient and family. After consideration of risks, benefits and other options for treatment, the patient has consented to  Procedure(s) with comments: ESOPHAGOGASTRODUODENOSCOPY (EGD) (N/A) - 730am ESOPHAGEAL DILATION (N/A) as a surgical intervention .  The patient's history has been reviewed, patient examined, no change in status, stable for surgery.  I have reviewed the patient's chart and labs.  Questions were answered to the patient's satisfaction.     Hollis Tuller  No Coumadin in 5 days.  Irregular rhythm noted with sinus pulses. Patient has pre-existing heart disease. Otherwise no change. EGD with probable esophageal dilation as appropriate. The risks, benefits, limitations, alternatives and imponderables have been reviewed with the patient. Potential for esophageal dilation, biopsy, etc. have also been reviewed.  Questions have been answered. All parties agreeable.

## 2015-02-08 ENCOUNTER — Encounter (HOSPITAL_COMMUNITY): Payer: Self-pay | Admitting: Internal Medicine

## 2015-02-08 ENCOUNTER — Telehealth: Payer: Self-pay | Admitting: Cardiology

## 2015-02-08 NOTE — Telephone Encounter (Signed)
Sara Garcia called stating that when she had her upper Endoscopy on 02/07/15 at [facility]. States that she started having issues with her heart And she felt that she needed to contact Dr. Harl Bowie.

## 2015-02-08 NOTE — Telephone Encounter (Signed)
Pt asymptomatic, was confused when someone mentioned heart rhythms after endoscopy. Pt was advised to call with any problems/symptoms. Will forward to Dr. Harl Bowie as Juluis Rainier

## 2015-02-08 NOTE — Telephone Encounter (Signed)
The doctors that did the procedure noted some extra heart beats during the procedure. I reviewed the heart rhythms and they are not anything to be worried about. Would continue her on the same meds  Sara Abts MD

## 2015-02-09 ENCOUNTER — Other Ambulatory Visit: Payer: Self-pay

## 2015-02-09 MED ORDER — RESTORA PO CAPS: 1.0000 | ORAL_CAPSULE | Freq: Every day | ORAL | Status: DC

## 2015-02-13 ENCOUNTER — Encounter: Payer: Self-pay | Admitting: Obstetrics & Gynecology

## 2015-03-07 ENCOUNTER — Ambulatory Visit (INDEPENDENT_AMBULATORY_CARE_PROVIDER_SITE_OTHER): Payer: Medicare HMO | Admitting: *Deleted

## 2015-03-07 DIAGNOSIS — I4891 Unspecified atrial fibrillation: Secondary | ICD-10-CM | POA: Diagnosis not present

## 2015-03-07 DIAGNOSIS — Z5181 Encounter for therapeutic drug level monitoring: Secondary | ICD-10-CM | POA: Diagnosis not present

## 2015-03-07 NOTE — Progress Notes (Signed)
Pt was started on Eliquis 5mg  bid for atrial fib on 02/03/15 by Dr Harl Bowie.  Coumadin was discontinued.  Pt has been on Eliquis x 1 month.  Tolerating well.  No s/s of increased bruising, bleeding or GI upset.  Reviewed patients medication list.  Pt is not currently on any combined P-gp and strong CYP3A4 inhibitors/inducers (ketoconazole, traconazole, ritonavir, carbamazepine, phenytoin, rifampin, St. John's wort).  Reviewed labs from 03/08/15.  SCr 0.83, Weight 140, CrCl 59.51.  Dose is appropriate based on 2 out of 3 criteria (age, weight, SrCr).   Hgb and HCT: 12.2/37.1  A full discussion of the nature of anticoagulants has been carried out.  A benefit/risk analysis has been presented to the patient, so that they understand the justification for choosing anticoagulation with Eliquis at this time.  The need for compliance is stressed.  Pt is aware to take the medication twice daily.  Side effects of potential bleeding are discussed, including unusual colored urine or stools, coughing up blood or coffee ground emesis, nose bleeds or serious fall or head trauma.  Discussed signs and symptoms of stroke. The patient should avoid any OTC items containing aspirin or ibuprofen.  Avoid alcohol consumption.   Call if any signs of abnormal bleeding.  Discussed financial obligations and resolved any difficulty in obtaining medication.  Next lab test test in 6 months.   Labs: 12/17/14  SrCr 0.89  Hgb 13.0  Hct 39.5

## 2015-03-08 ENCOUNTER — Other Ambulatory Visit: Payer: Self-pay | Admitting: Cardiology

## 2015-03-09 ENCOUNTER — Telehealth: Payer: Self-pay | Admitting: *Deleted

## 2015-03-09 LAB — CBC
HCT: 37.1 % (ref 36.0–46.0)
Hemoglobin: 12.2 g/dL (ref 12.0–15.0)
MCH: 28.6 pg (ref 26.0–34.0)
MCHC: 32.9 g/dL (ref 30.0–36.0)
MCV: 87.1 fL (ref 78.0–100.0)
MPV: 9.7 fL (ref 8.6–12.4)
Platelets: 237 10*3/uL (ref 150–400)
RBC: 4.26 MIL/uL (ref 3.87–5.11)
RDW: 14.7 % (ref 11.5–15.5)
WBC: 7.3 10*3/uL (ref 4.0–10.5)

## 2015-03-09 LAB — BASIC METABOLIC PANEL
BUN: 34 mg/dL — ABNORMAL HIGH (ref 6–23)
CO2: 23 mEq/L (ref 19–32)
Calcium: 8.9 mg/dL (ref 8.4–10.5)
Chloride: 106 mEq/L (ref 96–112)
Creat: 0.83 mg/dL (ref 0.50–1.10)
Glucose, Bld: 118 mg/dL — ABNORMAL HIGH (ref 70–99)
Potassium: 4.4 mEq/L (ref 3.5–5.3)
Sodium: 139 mEq/L (ref 135–145)

## 2015-03-09 NOTE — Telephone Encounter (Signed)
-----   Message from Arnoldo Lenis, MD sent at 03/09/2015 11:46 AM EDT ----- Labs look good  Zandra Abts MD

## 2015-03-09 NOTE — Telephone Encounter (Signed)
Pt daughter made aware, forwarded to pcp

## 2015-03-17 ENCOUNTER — Other Ambulatory Visit: Payer: Self-pay | Admitting: *Deleted

## 2015-03-17 MED ORDER — APIXABAN 5 MG PO TABS: 5.0000 mg | ORAL_TABLET | Freq: Two times a day (BID) | ORAL | Status: DC

## 2015-03-17 NOTE — Telephone Encounter (Signed)
Eliquis rx sent to [facility] at patient request.

## 2015-03-30 ENCOUNTER — Other Ambulatory Visit: Payer: Self-pay

## 2015-03-30 ENCOUNTER — Telehealth: Payer: Self-pay | Admitting: *Deleted

## 2015-03-30 NOTE — Telephone Encounter (Signed)
Message left on voice mail - checking on Eliquis prescription, really expensive.  $220.00 at [facility] - need help with cost.  Attempted to return call - left message.

## 2015-03-30 NOTE — Telephone Encounter (Signed)
Pt came by office and was wanting to have a refill on her amitiza. Was wondering if she should be taking all of these pills.  She states that she seen Dr. Anastasio Champion on 03/20/2015 and he gave her a RX for [place]. She hasn't taken it yet. Medication was reviewed with pt and she states she will take the Linzess that Dr. Anastasio Champion gave her and then go back on her Amitiza.  Pt requests  To have an office visit to clear things up. She needs a follow up OV for her EGD in 01/2015

## 2015-03-31 ENCOUNTER — Encounter: Payer: Self-pay | Admitting: Nurse Practitioner

## 2015-03-31 ENCOUNTER — Ambulatory Visit (INDEPENDENT_AMBULATORY_CARE_PROVIDER_SITE_OTHER): Payer: Medicare HMO | Admitting: Nurse Practitioner

## 2015-03-31 VITALS — BP 140/80 | HR 107 | Temp 97.4°F | Ht 61.0 in | Wt 137.8 lb

## 2015-03-31 DIAGNOSIS — K5900 Constipation, unspecified: Secondary | ICD-10-CM

## 2015-03-31 DIAGNOSIS — R1314 Dysphagia, pharyngoesophageal phase: Secondary | ICD-10-CM | POA: Diagnosis not present

## 2015-03-31 MED ORDER — LUBIPROSTONE 8 MCG PO CAPS: 8.0000 ug | ORAL_CAPSULE | Freq: Two times a day (BID) | ORAL | Status: DC

## 2015-03-31 NOTE — Telephone Encounter (Signed)
Patient came by to return call  140/over something. Her Gastrologist asked her to contact us in reference to her BP.  She stated that it is also causing her blurred vision.  Patient is in lobby

## 2015-03-31 NOTE — Assessment & Plan Note (Signed)
Patient with continued dysphagia symptoms after EGD and dilation. BPE completed last November show normal motility. Recommend speech therapy eval and treat referral for dysphagia evaluation and recommendations related to diet, head positioning, etc to improve symptoms. Patient agrees to this. Return for further evaluation in 3 months.

## 2015-03-31 NOTE — Assessment & Plan Note (Signed)
Constipation symptoms well controlled on Amitiza but she has run out. She was initially given an Rx for Linzess by another provider, which she filled. She would like to use this since it's paid for, but will not refill the Linzess due to the expense. When she finishes the Linzess she will refill her Amitiza. Advised to stop the Linzess if she has diarrhea. Return for further evaluation in 3 months.

## 2015-03-31 NOTE — Patient Instructions (Addendum)
1. He can take to [facility] since you have RD purchase that until it runs out. 2. If he developed diarrhea with the Linzess, stopped taking it. 3. We're done taking the Linzess which back to the Amitiza as it was working well for you and is cheaper on her insurance. 4. We will put in a referral for speech therapist to do an evaluation and make recommendations on how to best manage her dysphagia. 5. Return for follow-up appointment in 3 months.

## 2015-03-31 NOTE — Progress Notes (Signed)
Referring Provider: Doree Albee, MD Primary Care Physician:  Doree Albee, MD Primary GI:  Dr. Gala Romney  Chief Complaint  Patient presents with  . Follow-up    HPI: 81 year old female presents for follow-up on endoscopy. His last seen in our office for 02/01/2015 for dysphagia, recommended EGD with empiric dilation at that time. EGD found normal, patent tubular esophagus without compression or abnormality, stomach empty, small hiatal hernia, normal gastric mucosa, patent pylorus, normal first and second duodenum. Dilation was accomplished with a 36 French Maloney dilator with a small tear through the UES mucosa noted, otherwise no apparent complication. Recommended continued Nexium 40 mg daily, follow-up with cardiology regarding heart irregularity during procedure.  Today she states it took her 2 days to fully function from sedation. She's having increased dysphagia symptoms. Is concerned about her hiatal hernia and not sure how to deal with it. Hiatal hernia was small on endoscopy. BPE on 08/2014 found normal esophageal motility. Has problems swallowing with every meal. Feels it's impacted about head positional and movement. Has issues with solid foods, pills, and liquids. Occasional abdominal pain. Denies regurgitation. Has daily bowel movement. States her stool is softer than it was before. Amitiza was working well but she has run out. Dr. Morrison Old gave her a Rx for Linzess initially but it was too expensive. She wants to take the Linzess she's already filled until she runs out then obtain a refill of Amitiza. Denies blood in stool, melena. Denies chest pain, dyspnea, dizziness, lightheadedness, syncope, near syncope. Denies any other upper or lower GI symptoms.   Past Medical History  Diagnosis Date  . Arthritis   . Asthma   . Osteoporosis   . DVT (deep venous thrombosis) 2011/2012    chronic coumadin, h/o blood clots on coumadin.  . Dementia   . Essential hypertension,  benign   . Esophageal reflux   . GERD (gastroesophageal reflux disease)   . Unspecified prolapse of vaginal walls   . A-fib   . Esophageal dysphagia     limited food and pill    Past Surgical History  Procedure Laterality Date  . Abdominal hysterectomy    . Nasal septum surgery    . Tonsillectomy    . Colonoscopy w/ biopsies  02/01/2004    RMR: Anal papilla with normal rectum/Sigmoid diverticula/Small polyps in the cecum removed as described above. Inflammatory polyp  . Wrist fracture surgery    . Esophagogastroduodenoscopy      unknown year per patient  . Esophageal dilation      unknown year per patient.  . Esophagogastroduodenoscopy N/A 02/07/2015    RMR: Normal esophagus  status post  Maloney dilation. Small hiatal hernia  . Esophageal dilation N/A 02/07/2015    Procedure: ESOPHAGEAL DILATION;  Surgeon: Daneil Dolin, MD;  Location: [facility];  Service: Endoscopy;  Laterality: N/A;    Current Outpatient Prescriptions  Medication Sig Dispense Refill  . acetaminophen (TYLENOL) 500 MG tablet Take 1,000 mg by mouth 2 (two) times daily as needed for mild pain, moderate pain or headache.     Marland Kitchen apixaban (ELIQUIS) 5 MG TABS tablet Take 1 tablet (5 mg total) by mouth 2 (two) times daily. 60 tablet 6  . Calcium Carbonate-Vitamin D (CALTRATE 600+D PO) Take 1 tablet by mouth daily.    . Cholecalciferol (VITAMIN D3) 400 UNITS CAPS Take 1,200 Units by mouth daily.    . DULoxetine (CYMBALTA) 60 MG capsule Take 60 mg by mouth daily.     Marland Kitchen  esomeprazole (NEXIUM) 40 MG capsule Take 40 mg by mouth daily.     . Fluticasone-Salmeterol (ADVAIR) 250-50 MCG/DOSE AEPB Inhale 1 puff into the lungs daily.     . Lido-Capsaicin-Men-Methyl Sal 0.5-0.035-5-20 % PTCH Apply topically.    . Linaclotide (LINZESS) 145 MCG CAPS capsule Take 145 mcg by mouth daily.    Marland Kitchen lubiprostone (AMITIZA) 8 MCG capsule Take 1 capsule (8 mcg total) by mouth 2 (two) times daily with a meal. 60 capsule 3  . metoprolol  tartrate (LOPRESSOR) 25 MG tablet Take 1.5 tablets (37.5 mg total) by mouth 2 (two) times daily. 90 tablet 6  . Probiotic Product (RESTORA) CAPS Take 1 capsule by mouth daily. 30 capsule 11  . pyridOXINE (VITAMIN B-6) 50 MG tablet Take 50 mg by mouth daily.    . vitamin B-12 (CYANOCOBALAMIN) 1000 MCG tablet Take 1,000 mcg by mouth daily.     No current facility-administered medications for this visit.    Allergies as of 03/31/2015 - Review Complete 03/31/2015  Allergen Reaction Noted  . Latex Itching and Rash 10/20/2013  . Adhesive [tape] Other (See Comments) 10/20/2013  . Ciprofloxacin Nausea And Vomiting 10/20/2013  . Codeine Nausea And Vomiting 10/20/2013  . Penicillins Hives 10/20/2013  . Sulfa antibiotics Hives 10/20/2013    Family History  Problem Relation Age of Onset  . Colon polyps Neg Hx   . Colon cancer Neg Hx     History   Social History  . Marital Status: Married    Spouse Name: N/A  . Number of Children: N/A  . Years of Education: N/A   Social History Main Topics  . Smoking status: Never Smoker   . Smokeless tobacco: Never Used  . Alcohol Use: No  . Drug Use: No  . Sexual Activity: Not Currently    Birth Control/ Protection: None   Other Topics Concern  . None   Social History Narrative    Review of Systems: General: Negative for anorexia, weight loss, fever, chills. Admits occasional fatigue, weakness. ENT: Negative for hoarseness. CV: Negative for chest pain, angina, palpitations, peripheral edema.  Respiratory: Negative for dyspnea at rest, cough, wheezing.  GI: See history of present illness.  Psych: Negative for anxiety, depression.  Endo: Negative for unusual weight change.  Heme: Negative for bruising or bleeding.   Physical Exam: BP 140/80 mmHg  Pulse 107  Temp(Src) 97.4 F (36.3 C) (Oral)  Ht 5\' 1"  (1.549 m)  Wt 137 lb 12.8 oz (62.506 kg)  BMI 26.05 kg/m2 General:   Alert and oriented. Pleasant and cooperative. Well-nourished and  well-developed.  Head:  Normocephalic and atraumatic. Eyes:  Without icterus, sclera clear and conjunctiva pink.  Ears:  Normal auditory acuity. Cardiovascular:  S1, S2 present without murmurs appreciated. extremities without clubbing or edema. Respiratory:  Clear to auscultation bilaterally. No wheezes, rales, or rhonchi. No distress.  Gastrointestinal:  +BS, soft, and non-distended. Mild generalized No HSM noted. No guarding or rebound. No masses appreciated.  Rectal:  Deferred  Skin:  Intact without significant lesions or rashes. Neurologic:  Alert and oriented x4;  grossly normal neurologically. Psych:  Alert and cooperative. Normal mood and affect. Heme/Lymph/Immune: No excessive bruising noted.    03/31/2015 10:43 AM

## 2015-04-03 NOTE — Telephone Encounter (Signed)
Eliquis patient assistance number provided to patient.  This was main reason for stopping by office.

## 2015-04-03 NOTE — Progress Notes (Signed)
cc'd to pcp 

## 2015-04-05 ENCOUNTER — Encounter: Payer: Self-pay | Admitting: Obstetrics & Gynecology

## 2015-04-05 ENCOUNTER — Ambulatory Visit (INDEPENDENT_AMBULATORY_CARE_PROVIDER_SITE_OTHER): Payer: Medicare HMO | Admitting: Obstetrics & Gynecology

## 2015-04-05 VITALS — BP 160/90 | HR 76 | Wt 131.0 lb

## 2015-04-05 DIAGNOSIS — N993 Prolapse of vaginal vault after hysterectomy: Secondary | ICD-10-CM

## 2015-04-12 ENCOUNTER — Telehealth: Payer: Self-pay | Admitting: *Deleted

## 2015-04-12 NOTE — Telephone Encounter (Signed)
Pt says Eliquis assistance is faxing [place] information for pt and pt will come in to provide SS  And wage information. Will give pt samples unitl application complete.

## 2015-04-19 NOTE — Progress Notes (Signed)
Pt was going to call us back because she had a friend that worked at speech therapy but she has never call us back to give [place] the name of the place to refer her.

## 2015-04-20 ENCOUNTER — Other Ambulatory Visit (HOSPITAL_COMMUNITY): Payer: Self-pay | Admitting: Internal Medicine

## 2015-04-20 DIAGNOSIS — M810 Age-related osteoporosis without current pathological fracture: Secondary | ICD-10-CM

## 2015-04-20 DIAGNOSIS — Z780 Asymptomatic menopausal state: Secondary | ICD-10-CM

## 2015-04-24 ENCOUNTER — Other Ambulatory Visit: Payer: Self-pay | Admitting: *Deleted

## 2015-04-24 ENCOUNTER — Telehealth: Payer: Self-pay | Admitting: Cardiology

## 2015-04-24 MED ORDER — APIXABAN 5 MG PO TABS: 5.0000 mg | ORAL_TABLET | Freq: Two times a day (BID) | ORAL | Status: DC

## 2015-04-25 NOTE — Telephone Encounter (Signed)
Information forwarded to [facility].

## 2015-04-26 ENCOUNTER — Ambulatory Visit (HOSPITAL_COMMUNITY)
Admission: RE | Admit: 2015-04-26 | Discharge: 2015-04-26 | Disposition: A | Discharge status: Home / Self Care | Payer: Medicare HMO | Source: Ambulatory Visit | Attending: Internal Medicine | Admitting: Internal Medicine

## 2015-04-26 DIAGNOSIS — Z780 Asymptomatic menopausal state: Secondary | ICD-10-CM | POA: Diagnosis not present

## 2015-04-26 DIAGNOSIS — M810 Age-related osteoporosis without current pathological fracture: Secondary | ICD-10-CM | POA: Diagnosis present

## 2015-04-28 MED ORDER — APIXABAN 5 MG PO TABS: 5.0000 mg | ORAL_TABLET | Freq: Two times a day (BID) | ORAL | Status: DC

## 2015-04-28 NOTE — Telephone Encounter (Signed)
Rx and signed application faxed to [facility]

## 2015-05-01 ENCOUNTER — Telehealth: Payer: Self-pay | Admitting: *Deleted

## 2015-05-01 NOTE — Telephone Encounter (Signed)
Pt is approved for Eliquis through [employer] thru 10/14/15. Approval letter sent for scanning

## 2015-07-03 ENCOUNTER — Ambulatory Visit (INDEPENDENT_AMBULATORY_CARE_PROVIDER_SITE_OTHER): Payer: Medicare HMO | Admitting: Nurse Practitioner

## 2015-07-03 ENCOUNTER — Encounter: Payer: Self-pay | Admitting: Internal Medicine

## 2015-07-03 ENCOUNTER — Encounter: Payer: Self-pay | Admitting: Nurse Practitioner

## 2015-07-03 VITALS — BP 130/79 | HR 68 | Temp 98.3°F | Ht 63.0 in | Wt 141.4 lb

## 2015-07-03 DIAGNOSIS — R1314 Dysphagia, pharyngoesophageal phase: Secondary | ICD-10-CM

## 2015-07-03 DIAGNOSIS — K5900 Constipation, unspecified: Secondary | ICD-10-CM | POA: Diagnosis not present

## 2015-07-03 NOTE — Patient Instructions (Addendum)
Add 1 additional fruit or vegetable a day to your diet.  Continue carefully eating and chewing her foods as we discussed.  Return for follow-up in 6 months.  I think he would be a good idea to contact her primary care doctor to discuss your recent feelings.   Dysphagia Level 3 Diet, Mechanically Advanced The dysphagia level 3 diet includes foods that are soft, moist, and can be chopped into 1-inch chunks. This diet is helpful for people with mild swallowing difficulties. It reduces the risk of food getting caught in the windpipe, trachea, or lungs. WHAT DO I NEED TO KNOW ABOUT THIS DIET?  You may eat foods that are soft and moist.  If you were on the dysphagia level 1 or level 2 diets, you may eat any of the foods included on those lists.  Avoid foods that are dry, hard, sticky, chewy, coarse, and crunchy. Also avoid large cuts of food.  Take small bites. Each bite should contain 1 inch or less of food.  Thicken liquids if instructed by your health care provider. Follow your health care provider's instructions on how to do this and to what consistency.  See your dietitian or speech language pathologist regularly for help with your dietary changes. WHAT FOODS CAN I EAT? Grains Moist breads without nuts or seeds. Biscuits, muffins, pancakes, and waffles well-moistened with syrup, jelly, margarine, or butter. Smooth cereals with plenty of milk to moisten them. Moist bread stuffing. Moist rice. Vegetables All cooked, soft vegetables. Shredded lettuce. Tender fried potatoes. Fruits All canned and cooked fruits. Soft, peeled fresh fruits, such as peaches, nectarines, kiwis, cantaloupe, honeydew melon, and watermelon without seeds. Soft berries, such as strawberries. Meat and Other Protein Sources Moist ground or finely diced or sliced meats. Solid, tender cuts of meat. Meatloaf. Hamburger with a bun. Sausage patty. Deli thin-sliced lunch meat. Chicken, egg, or tuna salad sandwich. Sloppy  joe. Moist fish. Eggs prepared any way. Casseroles with small chunks of meats, ground meats, or tender meats. Dairy Cheese spreads without coarse large chunks. Shredded cheese. Cheese slices. Cottage cheese. Milk at the right texture. Smooth frappes. Yogurt without nuts or coconut. Ask your health care provider whether you can have frozen desserts (such as malts or milk shakes) and thin liquids. Sweets/Desserts Soft, smooth, moist desserts. Non-chewy, smooth candy. Jam. Jelly. Honey. Preserves. Ask your health care provider whether you can have frozen desserts. Fats and Oils Butter. Oils. Margarine. Mayonnaise. Gravy. Spreads. Other All seasonings and sweeteners. All sauces without large chunks. The items listed above may not be a complete list of recommended foods or beverages. Contact your dietitian for more options. WHAT FOODS ARE NOT RECOMMENDED? Grains Coarse or dry cereals. Dry breads. Toast. Crackers. Tough, crusty breads, such French bread and baguettes. Tough, crisp fried potatoes. Potato skins. Dry bread stuffing. Granola. Popcorn. Chips. Vegetables All raw vegetables except shredded lettuce. Cooked corn. Rubbery or stiff cooked vegetables. Stringy vegetables, such as celery. Fruits Hard fruits that are difficult to chew, such as apples or pears. Stringy, high-pulp fruits, such as pineapple, papaya, or mango. Fruits with tough skins, such as grapes. Coconut. All dried fruits. Fruit leather. Fruit roll-ups. Fruit snacks. Meat and Other Protein Sources Dry or tough meats or poultry. Dry fish. Fish with bones. Peanut butter. All nuts and seeds. Dairy  Any with nuts, seeds, chocolate chips, dried fruit, coconut, or pineapple. Sweets/Desserts Dry cakes. Chewy or dry cookies. Any with nuts, seeds, dry fruits, coconut, pineapple, or anything dry, sticky, or  hard. Chewy caramel. Licorice. Taffy-type candies. Ask your health care provider whether you can have frozen desserts. Fats and  Oils Any with chunks, nuts, seeds, or pineapple. Olives. Sara Garcia. Other Soups with tough or large chunks of meats, poultry, or vegetables. Corn or clam chowder. The items listed above may not be a complete list of foods and beverages to avoid. Contact your dietitian for more information. Document Released: 09/30/2005 Document Revised: 06/02/2013 Document Reviewed: 09/13/2013 [facility] Patient Information 2015 [place], [place]. This information is not intended to replace advice given to you by your health care provider. Make sure you discuss any questions you have with your health care provider.  Hiatal Hernia A hiatal hernia occurs when part of your stomach slides above the muscle that separates your abdomen from your chest (diaphragm). You can be born with a hiatal hernia (congenital), or it may develop over time. In almost all cases of hiatal hernia, only the top part of the stomach pushes through.  Many people have a hiatal hernia with no symptoms. The larger the hernia, the more likely that you will have symptoms. In some cases, a hiatal hernia allows stomach acid to flow back into the tube that carries food from your mouth to your stomach (esophagus). This may cause heartburn symptoms. Severe heartburn symptoms may mean you have developed a condition called gastroesophageal reflux disease (GERD).  CAUSES  Hiatal hernias are caused by a weakness in the opening (hiatus) where your esophagus passes through your diaphragm to attach to the upper part of your stomach. You may be born with a weakness in your hiatus, or a weakness can develop. RISK FACTORS Older age is a major risk factor for a hiatal hernia. Anything that increases pressure on your diaphragm can also increase your risk of a hiatal hernia. This includes:  Pregnancy.  Excess weight.  Frequent constipation. SIGNS AND SYMPTOMS  People with a hiatal hernia often have no symptoms. If symptoms develop, they are almost always caused by  GERD. They may include:  Heartburn.  Belching.  Indigestion.  Trouble swallowing.  Coughing or wheezing.  Sore throat.  Hoarseness.  Chest pain. DIAGNOSIS  A hiatal hernia is sometimes found during an exam for another problem. Your health care provider may suspect a hiatal hernia if you have symptoms of GERD. Tests may be done to diagnose GERD. These may include:  X-rays of your stomach or chest.  An upper gastrointestinal (GI) series. This is an X-ray exam of your GI tract involving the use of a chalky liquid that you swallow. The liquid shows up clearly on the X-ray.  Endoscopy. This is a procedure to look into your stomach using a thin, flexible tube that has a tiny camera and light on the end of it. TREATMENT  If you have no symptoms, you may not need treatment. If you have symptoms, treatment may include:  Dietary and lifestyle changes to help reduce GERD symptoms.  Medicines. These may include:  Over-the-counter antacids.  Medicines that make your stomach empty more quickly.  Medicines that block the production of stomach acid (H2 blockers).  Stronger medicines to reduce stomach acid (proton pump inhibitors).  You may need surgery to repair the hernia if other treatments are not helping. HOME CARE INSTRUCTIONS   Take all medicines as directed by your health care provider.  Quit smoking, if you smoke.  Try to achieve and maintain a healthy body weight.  Eat frequent small meals instead of three large meals a day. This keeps  your stomach from getting too full.  Eat slowly.  Do not lie down right after eating.  Do noteat 1-2 hours before bed.   Do not drink beverages with caffeine. These include cola, coffee, cocoa, and tea.  Do not drink alcohol.  Avoid foods that can make symptoms of GERD worse. These may include:  Fatty foods.  Citrus fruits.  Other foods and drinks that contain acid.  Avoid putting pressure on your belly. Anything that  puts pressure on your belly increases the amount of acid that may be pushed up into your esophagus.   Avoid bending over, especially after eating.  Raise the head of your bed by putting blocks under the legs. This keeps your head and esophagus higher than your stomach.  Do not wear tight clothing around your chest or stomach.  Try not to strain when having a bowel movement, when urinating, or when lifting heavy objects. SEEK MEDICAL CARE IF:  Your symptoms are not controlled with medicines or lifestyle changes.  You are having trouble swallowing.  You have coughing or wheezing that will not go away. SEEK IMMEDIATE MEDICAL CARE IF:  Your pain is getting worse.  Your pain spreads to your arms, neck, jaw, teeth, or back.  You have shortness of breath.  You sweat for no reason.  You feel sick to your stomach (nauseous) or vomit.  You vomit blood.  You have bright red blood in your stools.  You have black, tarry stools.  Document Released: 12/21/2003 Document Revised: 02/14/2014 Document Reviewed: 09/17/2013 [facility] Patient Information 2015 [place], [place]. This information is not intended to replace advice given to you by your health care provider. Make sure you discuss any questions you have with your health care provider.

## 2015-07-03 NOTE — Assessment & Plan Note (Signed)
Constipation symptoms seem relatively well controlled today while not taking Amitiza or Linzess. Patient states she does not desire to retry these medicines at this time. She has adequate intake probably low fiber intake. We'll encourage her to add an additional food or vegetable to her diet daily. She is already on daily Benefiber. We'll have her return for follow-up in 6 months.

## 2015-07-03 NOTE — Progress Notes (Signed)
Referring Provider: Doree Albee, MD Primary Care Physician:  Doree Albee, MD Primary GI:  Dr. Gala Romney  Chief Complaint  Patient presents with  . Follow-up    HPI:   81 year old female presents for follow-up on dysphagia and constipation. EGD 02/07/15 found normal, patent tubular esophagus without compression or abnormality, stomach empty, small hiatal hernia, normal gastric mucosa, patent pylorus, normal first and second duodenum. Dilation was accomplished with a 72 French Maloney dilator with a small tear through the UES mucosa noted, otherwise no apparent complication. Recommended continued Nexium 40 mg daily, follow-up with cardiology regarding heart irregularity during procedure. At last visit she is still having some dysphagia symptoms. She is also having daily bowel movement which is soft within normal. She had been prescribed Amitiza but ran out, her PCP gave her an Rx for Linzess but she didn't want to keep taking it because it is too expensive. At last visit she decided she would finish Linzess GERD hadn't paid for and would switch back to Amitiza. At her last visit recommended returning to [place], which had previously worked well, when she ran out of [place]. BPE recently completed showed normal motility and continues to have dysphagia symptoms. Recommended speech therapy bowel and treated for dysphagia evaluation and recommendations which she agreed to do.  Today she states her dysphagia is "off and on" ok. If she's careful with eating "like I should have been my whole life" I do ok. She decided to not see SLP. She is not currently taking Linzess or Amitiza. Has a daily bowel movement. Sometimes consistent with [place] 1-2, sometimes bristol 4. This is satisfactory to her. She is on Eliquis now instead of coumadin. Denies hematochezia. Has rare abdominal pain which self-resolves after a bowel movement. Drinks a good amount of water (per her.) Eats only whole grain breads, does  not eat enough fruits/vegetables. Denies chest pain, dyspnea, dizziness, lightheadedness, syncope, near syncope. Denies any other upper or lower GI symptoms. She describes feelings of not wanting to participate in activities, even activities she enjoys, and a general sense of apaty. She states she does not like feeling like this and this is unusual for her. She states a house calls provider told her she doesn't have depression but she's not sure. Is currently on Cymbalta 60 mg daily.    Past Medical History  Diagnosis Date  . Arthritis   . Asthma   . Osteoporosis   . DVT (deep venous thrombosis) 2011/2012    chronic coumadin, h/o blood clots on coumadin.  . Dementia   . Essential hypertension, benign   . Esophageal reflux   . GERD (gastroesophageal reflux disease)   . Unspecified prolapse of vaginal walls   . A-fib   . Esophageal dysphagia     limited food and pill    Past Surgical History  Procedure Laterality Date  . Abdominal hysterectomy    . Nasal septum surgery    . Tonsillectomy    . Colonoscopy w/ biopsies  02/01/2004    RMR: Anal papilla with normal rectum/Sigmoid diverticula/Small polyps in the cecum removed as described above. Inflammatory polyp  . Wrist fracture surgery    . Esophagogastroduodenoscopy      unknown year per patient  . Esophageal dilation      unknown year per patient.  . Esophagogastroduodenoscopy N/A 02/07/2015    RMR: Normal esophagus  status post  Maloney dilation. Small hiatal hernia  . Esophageal dilation N/A 02/07/2015    Procedure: ESOPHAGEAL DILATION;  Surgeon: Daneil Dolin, MD;  Location: [facility];  Service: Endoscopy;  Laterality: N/A;    Current Outpatient Prescriptions  Medication Sig Dispense Refill  . acetaminophen (TYLENOL) 500 MG tablet Take 1,000 mg by mouth 2 (two) times daily as needed for mild pain, moderate pain or headache.     Marland Kitchen apixaban (ELIQUIS) 5 MG TABS tablet Take 1 tablet (5 mg total) by mouth 2 (two) times daily.  180 tablet 3  . Calcium Carbonate-Vitamin D (CALTRATE 600+D PO) Take 1 tablet by mouth daily.    . Cholecalciferol (VITAMIN D3) 400 UNITS CAPS Take 1,200 Units by mouth daily.    . DULoxetine (CYMBALTA) 60 MG capsule Take 60 mg by mouth daily.     Marland Kitchen esomeprazole (NEXIUM) 40 MG capsule Take 40 mg by mouth daily.     . Fluticasone-Salmeterol (ADVAIR) 250-50 MCG/DOSE AEPB Inhale 1 puff into the lungs daily.     . Lido-Capsaicin-Men-Methyl Sal 0.5-0.035-5-20 % PTCH Apply topically.    . metoprolol tartrate (LOPRESSOR) 25 MG tablet Take 1.5 tablets (37.5 mg total) by mouth 2 (two) times daily. 90 tablet 6  . Probiotic Product (RESTORA) CAPS Take 1 capsule by mouth daily. 30 capsule 11  . pyridOXINE (VITAMIN B-6) 50 MG tablet Take 50 mg by mouth daily.    . vitamin B-12 (CYANOCOBALAMIN) 1000 MCG tablet Take 1,000 mcg by mouth daily.    . Linaclotide (LINZESS) 145 MCG CAPS capsule Take 145 mcg by mouth daily.    Marland Kitchen lubiprostone (AMITIZA) 8 MCG capsule Take 1 capsule (8 mcg total) by mouth 2 (two) times daily with a meal. (Patient not taking: Reported on 04/05/2015) 60 capsule 3   No current facility-administered medications for this visit.    Allergies as of 07/03/2015 - Review Complete 07/03/2015  Allergen Reaction Noted  . Latex Itching and Rash 10/20/2013  . Adhesive [tape] Other (See Comments) 10/20/2013  . Ciprofloxacin Nausea And Vomiting 10/20/2013  . Codeine Nausea And Vomiting 10/20/2013  . Penicillins Hives 10/20/2013  . Sulfa antibiotics Hives 10/20/2013    Family History  Problem Relation Age of Onset  . Colon polyps Neg Hx   . Colon cancer Neg Hx     Social History   Social History  . Marital Status: Married    Spouse Name: N/A  . Number of Children: N/A  . Years of Education: N/A   Social History Main Topics  . Smoking status: Never Smoker   . Smokeless tobacco: Never Used  . Alcohol Use: No  . Drug Use: No  . Sexual Activity: Not Currently    Birth Control/  Protection: None   Other Topics Concern  . None   Social History Narrative    Review of Systems: General: Negative for anorexia, weight loss, fever, chills, fatigue, weakness. ENT: Negative for hoarseness. CV: Negative for chest pain, angina, palpitations, peripheral edema.  Respiratory: Negative for dyspnea at rest, cough, sputum, wheezing.  GI: See history of present illness. Derm: Negative for rash or itching.  Psych: Negative for suicidal ideation, hallucinations.  Endo: Negative for unusual weight change.  Heme: Negative for bruising or bleeding.   Physical Exam: BP 130/79 mmHg  Pulse 68  Temp(Src) 98.3 F (36.8 C) (Oral)  Ht 5\' 3"  (1.6 m)  Wt 141 lb 6.4 oz (64.139 kg)  BMI 25.05 kg/m2 General:   Alert and oriented. Pleasant and cooperative. Well-nourished and well-developed.  Head:  Normocephalic and atraumatic. Eyes:  Without icterus, sclera clear and conjunctiva pink.  Ears:  Normal auditory acuity. Cardiovascular:  S1, S2 present without murmurs appreciated. Normal pulses noted. Extremities without clubbing or edema. Respiratory:  Clear to auscultation bilaterally. No wheezes, rales, or rhonchi. No distress.  Gastrointestinal:  +BS, soft, non-tender and non-distended. No HSM noted. No guarding or rebound. No masses appreciated.  Rectal:  Deferred  Psych:  Alert and cooperative. Normal mood and affect. Heme/Lymph/Immune: No excessive bruising noted.    07/03/2015 11:14 AM

## 2015-07-03 NOTE — Assessment & Plan Note (Signed)
Continues to have dysphagia symptoms which she is able to control relatively well with appropriate diet and chewing/swallowing precautions. She declines speech lying which pathology report for all this time. BPE previously shown normal. Easily had an endoscopy with dilation. We'll have her return for follow-up in 6 months.

## 2015-07-03 NOTE — Progress Notes (Signed)
cc'ed to pcp °

## 2015-07-06 ENCOUNTER — Encounter: Payer: Self-pay | Admitting: Obstetrics & Gynecology

## 2015-07-06 ENCOUNTER — Ambulatory Visit (INDEPENDENT_AMBULATORY_CARE_PROVIDER_SITE_OTHER): Payer: Medicare HMO | Admitting: Obstetrics & Gynecology

## 2015-07-06 VITALS — BP 122/70 | HR 80 | Wt 139.4 lb

## 2015-07-06 DIAGNOSIS — N993 Prolapse of vaginal vault after hysterectomy: Secondary | ICD-10-CM

## 2015-07-06 NOTE — Progress Notes (Signed)
Patient ID: Sara Garcia, female   DOB: Mar 12, 1928, 81 y.o.   MRN: [id number] Chief Complaint  Patient presents with  . 3 month follow-up    clean pessary.    Blood pressure 122/70, pulse 80, weight 139 lb 6.4 oz (63.231 kg).  Sara Garcia presents today for routine follow up related to her pessary.   She uses a milex ring #2 She reports no vaginal discharge or vaginal bleeding.  Exam reveals no undue vaginal mucosal pressure of breakdown, no discharge and no vaginal bleeding.  The pessary is removed, cleaned and replaced without difficulty.    Sara Garcia will be sen back in 4 months for continued follow up.  @[facility] @ 07/06/2015 2:59 PM

## 2015-08-07 ENCOUNTER — Encounter: Payer: Self-pay | Admitting: Cardiology

## 2015-08-07 ENCOUNTER — Encounter: Payer: Self-pay | Admitting: *Deleted

## 2015-08-07 ENCOUNTER — Ambulatory Visit (INDEPENDENT_AMBULATORY_CARE_PROVIDER_SITE_OTHER): Payer: Medicare HMO | Admitting: Cardiology

## 2015-08-07 VITALS — BP 149/90 | HR 108 | Ht 62.0 in | Wt 141.4 lb

## 2015-08-07 DIAGNOSIS — Z86718 Personal history of other venous thrombosis and embolism: Secondary | ICD-10-CM | POA: Diagnosis not present

## 2015-08-07 DIAGNOSIS — Z7901 Long term (current) use of anticoagulants: Secondary | ICD-10-CM | POA: Diagnosis not present

## 2015-08-07 DIAGNOSIS — I4891 Unspecified atrial fibrillation: Secondary | ICD-10-CM | POA: Diagnosis not present

## 2015-08-07 DIAGNOSIS — I10 Essential (primary) hypertension: Secondary | ICD-10-CM

## 2015-08-07 DIAGNOSIS — R5383 Other fatigue: Secondary | ICD-10-CM

## 2015-08-07 NOTE — Patient Instructions (Signed)
Your physician wants you to follow-up in: 6 months with Dr. Bryna Colander will receive a reminder letter in the mail two months in advance. If you don't receive a letter, please call our office to schedule the follow-up appointment.  Your physician recommends that you continue on your current medications as directed. Please refer to the Current Medication list given to you today.  WE WILL REQUEST LAB RESULTS AND ED NOTES   Thank you for choosing [facility]!!

## 2015-08-07 NOTE — Progress Notes (Signed)
Patient ID: [name], female   DOB: Mar 15, 1928, 81 y.o.   MRN: [id number]     Clinical Summary Ms. Zumstein is a 81 y.o.female seen today for follow up of the following medical problems.   1. Parox afib - on beta blocker for rate control, eliquis stroke prophylaxis. Denies any bleeding troubles.  - previously we had icreased her lopressor to 50mg  bid from 37.5 mg bid. With change she reported worsening fluttering in her chest and fatigue, we cut dose back to 37.5mg  bid.and she felt better  - had GI viral illness 2 weeks ago with N/V, unable to keep meds down. Had some feelings of palpitations around that time but have since resolved.   2. Dysphagia - followed by GI  3. HTN - does not check at home.  - compliant with meds  4. Hx of DVT - on eliquis, denies any bleeding issues.   5. Fatigue - During recent beach trip to [facility] [place] [facility], [place] developed significant N/V and fatigue. Seen in ER and found to be dehydrated. Was not able to keep meds down, had some palpitations around that time. Symptoms of N/V and palpitations have resolved, still with some lingering fatigue.  - October 13th while at [facility], [place]  Past Medical History  Diagnosis Date  . Arthritis   . Asthma   . Osteoporosis   . DVT (deep venous thrombosis) 2011/2012    chronic coumadin, h/o blood clots on coumadin.  . Dementia   . Essential hypertension, benign   . Esophageal reflux   . GERD (gastroesophageal reflux disease)   . Unspecified prolapse of vaginal walls   . A-fib   . Esophageal dysphagia     limited food and pill     Allergies  Allergen Reactions  . Latex Itching and Rash  . Adhesive [Tape] Other (See Comments)    Tears skin  . Ciprofloxacin Nausea And Vomiting  . Codeine Nausea And Vomiting    Upsets stomach, nightmares  . Penicillins Hives  . Sulfa Antibiotics Hives     Current Outpatient Prescriptions  Medication Sig Dispense Refill  . acetaminophen (TYLENOL) 500 MG tablet Take 1,000  mg by mouth 2 (two) times daily as needed for mild pain, moderate pain or headache.     Marland Kitchen apixaban (ELIQUIS) 5 MG TABS tablet Take 1 tablet (5 mg total) by mouth 2 (two) times daily. 180 tablet 3  . Calcium Carbonate-Vitamin D (CALTRATE 600+D PO) Take 1 tablet by mouth daily.    . Cholecalciferol (VITAMIN D3) 400 UNITS CAPS Take 1,200 Units by mouth daily.    . DULoxetine (CYMBALTA) 60 MG capsule Take 60 mg by mouth daily.     Marland Kitchen esomeprazole (NEXIUM) 40 MG capsule Take 40 mg by mouth daily.     . Fluticasone-Salmeterol (ADVAIR) 250-50 MCG/DOSE AEPB Inhale 1 puff into the lungs daily.     . Lido-Capsaicin-Men-Methyl Sal 0.5-0.035-5-20 % PTCH Apply topically.    . Linaclotide (LINZESS) 145 MCG CAPS capsule Take 145 mcg by mouth daily.    Marland Kitchen lubiprostone (AMITIZA) 8 MCG capsule Take 1 capsule (8 mcg total) by mouth 2 (two) times daily with a meal. (Patient not taking: Reported on 04/05/2015) 60 capsule 3  . metoprolol tartrate (LOPRESSOR) 25 MG tablet Take 1.5 tablets (37.5 mg total) by mouth 2 (two) times daily. 90 tablet 6  . Probiotic Product (RESTORA) CAPS Take 1 capsule by mouth daily. (Patient not taking: Reported on 07/06/2015) 30 capsule 11  . pyridOXINE (  VITAMIN B-6) 50 MG tablet Take 50 mg by mouth daily.    . vitamin B-12 (CYANOCOBALAMIN) 1000 MCG tablet Take 1,000 mcg by mouth daily.     No current facility-administered medications for this visit.     Past Surgical History  Procedure Laterality Date  . Abdominal hysterectomy    . Nasal septum surgery    . Tonsillectomy    . Colonoscopy w/ biopsies  02/01/2004    RMR: Anal papilla with normal rectum/Sigmoid diverticula/Small polyps in the cecum removed as described above. Inflammatory polyp  . Wrist fracture surgery    . Esophagogastroduodenoscopy      unknown year per patient  . Esophageal dilation      unknown year per patient.  . Esophagogastroduodenoscopy N/A 02/07/2015    RMR: Normal esophagus  status post  Maloney dilation.  Small hiatal hernia  . Esophageal dilation N/A 02/07/2015    Procedure: ESOPHAGEAL DILATION;  Surgeon: Daneil Dolin, MD;  Location: [facility];  Service: Endoscopy;  Laterality: N/A;     Allergies  Allergen Reactions  . Latex Itching and Rash  . Adhesive [Tape] Other (See Comments)    Tears skin  . Ciprofloxacin Nausea And Vomiting  . Codeine Nausea And Vomiting    Upsets stomach, nightmares  . Penicillins Hives  . Sulfa Antibiotics Hives      Family History  Problem Relation Age of Onset  . Colon polyps Neg Hx   . Colon cancer Neg Hx      Social History Ms. Pettigrew reports that she has never smoked. She has never used smokeless tobacco. Ms. Boman reports that she does not drink alcohol.   Review of Systems CONSTITUTIONAL: No weight loss, fever, chills, weakness or fatigue.  HEENT: Eyes: No visual loss, blurred vision, double vision or yellow sclerae.No hearing loss, sneezing, congestion, runny nose or sore throat.  SKIN: No rash or itching.  CARDIOVASCULAR: per HPI RESPIRATORY: No shortness of breath, cough or sputum.  GASTROINTESTINAL: per HPI GENITOURINARY: No burning on urination, no polyuria NEUROLOGICAL: No headache, dizziness, syncope, paralysis, ataxia, numbness or tingling in the extremities. No change in bowel or bladder control.  MUSCULOSKELETAL: No muscle, back pain, joint pain or stiffness.  LYMPHATICS: No enlarged nodes. No history of splenectomy.  PSYCHIATRIC: No history of depression or anxiety.  ENDOCRINOLOGIC: No reports of sweating, cold or heat intolerance. No polyuria or polydipsia.  Marland Kitchen   Physical Examination Filed Vitals:   08/07/15 1523  BP: 149/90  Pulse: 108   Filed Vitals:   08/07/15 1523  Height: 5\' 2"  (1.575 m)  Weight: 141 lb 6.4 oz (64.139 kg)    Gen: resting comfortably, no acute distress HEENT: no scleral icterus, pupils equal round and reactive, no palptable cervical adenopathy,  CV: RRR, no m/r/g, no JVD Resp: Clear to  auscultation bilaterally GI: abdomen is soft, non-tender, non-distended, normal bowel sounds, no hepatosplenomegaly MSK: extremities are warm, no edema.  Skin: warm, no rash Neuro:  no focal deficits Psych: appropriate affect   Diagnostic Studies  12/2013 Lexicscan MPI Impression  Exercise Capacity: Lexiscan with no exercise.  BP Response: Normal blood pressure response.  Clinical Symptoms: Nausea  ECG Impression: No significant ST segment change suggestive of ischemia.  Comparison with Prior Nuclear Study: No previous nuclear study performed  Overall Impression: Normal stress nuclear study.  LV Ejection Fraction: 85%. LV Wall Motion: NL LV Function; NL Wall Motion    Jan 2015 echo Study Conclusions  - Study data: Technically difficult  study. - Left ventricle: The cavity size was normal. Wall thickness was increased in a pattern of mild LVH. There is mild basal septal hypertrophy without obstructive gradient. Systolic function was normal. The estimated ejection fraction was in the range of 60% to 65%. Doppler parameters are consistent with abnormal left ventricular relaxation (grade 1 diastolic dysfunction). - Aortic valve: Mildly calcified annulus. Trileaflet; mildly thickened leaflets. Trivial regurgitation. Valve area: 1.69cm^2(VTI). Valve area: 1.79cm^2 (Vmax). - Mitral valve: Mildly calcified annulus. Mildly thickened leaflets .   01/2015 Holter monitor No significant arrhythmias    Assessment and Plan  1. Parox afib -did not tolerate lopressor 50 mg bid, decreased back to 37.5mg  bid and doing well. - recent palpitations when she had severe N/V and could not keep meds down and was dehydrated, but have since resolved. EKG in clinic shows NSR with normal rate.  - continue current meds  2. HTN - at goal, continue current meds  3. Hx of DVT - continue eliquis  4. Fatigue - seems to still be recovering of probable GI virus. Request records from ER and  also recent labs from pcp    F/u 6 months   Arnoldo Lenis, M.D.

## 2015-08-08 ENCOUNTER — Encounter: Payer: Self-pay | Admitting: *Deleted

## 2015-08-09 NOTE — Progress Notes (Signed)
Patient ID: Sara Garcia, female   DOB: 05/01/1928, 81 y.o.   MRN: [id number] Chief Complaint  Patient presents with  . 3 month follow-up    clean pessary.    Blood pressure 160/90, pulse 76, weight 131 lb (59.421 kg).  Sara Garcia presents today for routine follow up related to her pessary.   She uses a milex ring #2 She reports little vaginal discharge or vaginal bleeding.  Exam reveals no undue vaginal mucosal pressure of breakdown, little discharge and little vaginal bleeding.  The pessary is removed, cleaned and replaced without difficulty.    Sara Garcia will be sen back in 3 months for continued follow up.  Florian Buff, MD 08/09/2015 11:58 PM

## 2015-08-10 ENCOUNTER — Ambulatory Visit: Payer: Medicare HMO | Admitting: Cardiology

## 2015-08-30 ENCOUNTER — Telehealth: Payer: Self-pay | Admitting: Internal Medicine

## 2015-08-30 NOTE — Telephone Encounter (Signed)
DEC RECALL FOR CHEST CT

## 2015-08-30 NOTE — Telephone Encounter (Signed)
Mailed letter °

## 2015-09-04 ENCOUNTER — Other Ambulatory Visit: Payer: Self-pay

## 2015-09-04 DIAGNOSIS — R1319 Other dysphagia: Secondary | ICD-10-CM

## 2015-09-04 DIAGNOSIS — R1310 Dysphagia, unspecified: Secondary | ICD-10-CM

## 2015-09-09 ENCOUNTER — Other Ambulatory Visit: Payer: Self-pay | Admitting: Cardiology

## 2015-09-11 ENCOUNTER — Ambulatory Visit (HOSPITAL_COMMUNITY)
Admission: RE | Admit: 2015-09-11 | Discharge: 2015-09-11 | Disposition: A | Discharge status: Home / Self Care | Payer: Medicare HMO | Source: Ambulatory Visit | Attending: Internal Medicine | Admitting: Internal Medicine

## 2015-09-11 DIAGNOSIS — R1310 Dysphagia, unspecified: Secondary | ICD-10-CM

## 2015-09-11 DIAGNOSIS — R1319 Other dysphagia: Secondary | ICD-10-CM

## 2015-09-11 DIAGNOSIS — K224 Dyskinesia of esophagus: Secondary | ICD-10-CM | POA: Insufficient documentation

## 2015-09-11 DIAGNOSIS — I700 Atherosclerosis of aorta: Secondary | ICD-10-CM | POA: Diagnosis not present

## 2015-09-11 DIAGNOSIS — R1314 Dysphagia, pharyngoesophageal phase: Secondary | ICD-10-CM | POA: Diagnosis present

## 2015-09-11 DIAGNOSIS — I10 Essential (primary) hypertension: Secondary | ICD-10-CM | POA: Insufficient documentation

## 2015-09-11 LAB — POCT I-STAT CREATININE: Creatinine, Ser: 1 mg/dL (ref 0.44–1.00)

## 2015-09-11 MED ORDER — IOHEXOL 350 MG/ML SOLN
100.0000 mL | Freq: Once | INTRAVENOUS | Status: AC | PRN
  Administered 2015-09-11: 100 mL via INTRAVENOUS

## 2015-09-12 ENCOUNTER — Ambulatory Visit (INDEPENDENT_AMBULATORY_CARE_PROVIDER_SITE_OTHER): Payer: Medicare HMO | Admitting: *Deleted

## 2015-09-12 DIAGNOSIS — I4891 Unspecified atrial fibrillation: Secondary | ICD-10-CM

## 2015-09-12 NOTE — Progress Notes (Signed)
Pt was started on Eliquis 5mg  bid for atrial fib on 02/03/15 by Dr Harl Bowie. Coumadin was discontinued.  Pt states she has been tolerating Eliquis well. No s/s of increased bruising, bleeding or GI upset.  Reviewed patients medication list. Pt is not currently on any combined P-gp and strong CYP3A4 inhibitors/inducers (ketoconazole, traconazole, ritonavir, carbamazepine, phenytoin, rifampin, St. John's wort). Reviewed labs from 07/28/15. SCr 0.5, Weight 141.6, CrCl 83.41. Dose is appropriate based on 2 out of 3 criteria (age, weight, SrCr). Hgb and HCT: 12.2/36.9  A full discussion of the nature of anticoagulants has been carried out. A benefit/risk analysis has been presented to the patient, so that they understand the justification for choosing anticoagulation with Eliquis at this time. The need for compliance is stressed. Pt is aware to take the medication twice daily. Side effects of potential bleeding are discussed, including unusual colored urine or stools, coughing up blood or coffee ground emesis, nose bleeds or serious fall or head trauma. Discussed signs and symptoms of stroke. The patient should avoid any OTC items containing aspirin or ibuprofen. Avoid alcohol consumption. Call if any signs of abnormal bleeding. Discussed financial obligations and resolved any difficulty in obtaining medication. Next lab test test in 6 months. Placed in recall.

## 2015-09-13 ENCOUNTER — Other Ambulatory Visit: Payer: Self-pay

## 2015-09-13 DIAGNOSIS — R911 Solitary pulmonary nodule: Secondary | ICD-10-CM

## 2015-09-19 ENCOUNTER — Ambulatory Visit (HOSPITAL_COMMUNITY)
Admission: RE | Admit: 2015-09-19 | Discharge: 2015-09-19 | Disposition: A | Discharge status: Home / Self Care | Payer: Medicare HMO | Source: Ambulatory Visit | Attending: Internal Medicine | Admitting: Internal Medicine

## 2015-09-19 ENCOUNTER — Other Ambulatory Visit (HOSPITAL_COMMUNITY): Payer: Self-pay | Admitting: Internal Medicine

## 2015-09-19 DIAGNOSIS — M19041 Primary osteoarthritis, right hand: Secondary | ICD-10-CM | POA: Insufficient documentation

## 2015-09-19 DIAGNOSIS — J984 Other disorders of lung: Secondary | ICD-10-CM | POA: Insufficient documentation

## 2015-09-19 DIAGNOSIS — M79641 Pain in right hand: Secondary | ICD-10-CM | POA: Diagnosis present

## 2015-09-19 DIAGNOSIS — M85841 Other specified disorders of bone density and structure, right hand: Secondary | ICD-10-CM | POA: Diagnosis not present

## 2015-09-19 DIAGNOSIS — M25511 Pain in right shoulder: Secondary | ICD-10-CM

## 2015-09-19 DIAGNOSIS — R911 Solitary pulmonary nodule: Secondary | ICD-10-CM

## 2015-09-19 MED ORDER — IOHEXOL 300 MG/ML  SOLN
75.0000 mL | Freq: Once | INTRAMUSCULAR | Status: AC | PRN
  Administered 2015-09-19: 75 mL via INTRAVENOUS

## 2015-09-22 NOTE — Progress Notes (Signed)
ON RECALL  °

## 2015-10-05 ENCOUNTER — Other Ambulatory Visit: Payer: Self-pay | Admitting: *Deleted

## 2015-10-05 ENCOUNTER — Telehealth: Payer: Self-pay | Admitting: Cardiology

## 2015-10-05 MED ORDER — APIXABAN 5 MG PO TABS: 5.0000 mg | ORAL_TABLET | Freq: Two times a day (BID) | ORAL | Status: DC

## 2015-10-05 NOTE — Telephone Encounter (Signed)
Mrs. Larmon called requesting to speak to [facility] about Eliquis update.

## 2015-10-05 NOTE — Telephone Encounter (Signed)
Have not received update from [employer] regarding 2017 pt assistance for Eliquis. Will f/u with pt assistance, and pt is not out of medication, but would like this submitted for 2017. Application submitted

## 2015-10-25 ENCOUNTER — Encounter: Payer: Self-pay | Admitting: Internal Medicine

## 2015-10-31 ENCOUNTER — Encounter: Payer: Self-pay | Admitting: Orthopedic Surgery

## 2015-10-31 ENCOUNTER — Ambulatory Visit: Payer: Medicare HMO | Admitting: Orthopedic Surgery

## 2015-10-31 VITALS — BP 142/66 | Ht 62.0 in | Wt 144.0 lb

## 2015-10-31 DIAGNOSIS — M65331 Trigger finger, right middle finger: Secondary | ICD-10-CM | POA: Diagnosis not present

## 2015-10-31 DIAGNOSIS — M7551 Bursitis of right shoulder: Secondary | ICD-10-CM | POA: Diagnosis not present

## 2015-10-31 NOTE — Patient Instructions (Addendum)
Shoulder exercises  Joint Injection Care After Refer to this sheet in the next few days. These instructions provide you with information on caring for yourself after you have had a joint injection. Your caregiver also may give you more specific instructions. Your treatment has been planned according to current medical practices, but problems sometimes occur. Call your caregiver if you have any problems or questions after your procedure. After any type of joint injection, it is not uncommon to experience:  Soreness, swelling, or bruising around the injection site.  Mild numbness, tingling, or weakness around the injection site caused by the numbing medicine used before or with the injection. It also is possible to experience the following effects associated with the specific agent after injection:  Iodine-based contrast agents:  Allergic reaction (itching, hives, widespread redness, and swelling beyond the injection site).  Corticosteroids (These effects are rare.):  Allergic reaction.  Increased blood sugar levels (If you have diabetes and you notice that your blood sugar levels have increased, notify your caregiver).  Increased blood pressure levels.  Mood swings.  Hyaluronic acid in the use of viscosupplementation.  Temporary heat or redness.  Temporary rash and itching.  Increased fluid accumulation in the injected joint. These effects all should resolve within a day after your procedure.  HOME CARE INSTRUCTIONS  Limit yourself to light activity the day of your procedure. Avoid lifting heavy objects, bending, stooping, or twisting.  Take prescription or over-the-counter pain medication as directed by your caregiver.  You may apply ice to your injection site to reduce pain and swelling the day of your procedure. Ice may be applied 03-04 times:  Put ice in a plastic bag.  Place a towel between your skin and the bag.  Leave the ice on for no longer than 15-20 minutes  each time. SEEK IMMEDIATE MEDICAL CARE IF:   Pain and swelling get worse rather than better or extend beyond the injection site.  Numbness does not go away.  Blood or fluid continues to leak from the injection site.  You have chest pain.  You have swelling of your face or tongue.  You have trouble breathing or you become dizzy.  You develop a fever, chills, or severe tenderness at the injection site that last longer than 1 day. MAKE SURE YOU:  Understand these instructions.  Watch your condition.  Get help right away if you are not doing well or if you get worse. Document Released: 06/13/2011 Document Revised: 12/23/2011 Document Reviewed: 06/13/2011 [facility] Patient Information 2015 [place], [place]. This information is not intended to replace advice given to you by your health care provider. Make sure you discuss any questions you have with your health care provider.

## 2015-10-31 NOTE — Progress Notes (Signed)
Patient ID: Sara Garcia, female   DOB: February 07, 1928, 82 y.o.   MRN: [id number]  Chief Complaint  Patient presents with  . Shoulder Pain    Right shoulder pain, no known injury, referred by Dr Anastasio Champion    HPI Sara Garcia is a 82 y.o. female.  Presents for evaluation of her right shoulder and her right hand  Problem #1 right shoulder the patient was arm wrestling her husband felt some pain while doing it and since that time his had pain in the. Acromial region without weakness or loss of motion. Location right shoulder quality dull ache severity mild to moderate duration several months  Problem #2 Right hand she reports catching locking and triggering and tenderness in the palm associated with the trigger long finger  Review of systems no constitutional symptoms no skin changes no neurologic symptoms    Review of Systems Review of Systems  Past Medical History  Diagnosis Date  . Arthritis   . Asthma   . Osteoporosis   . DVT (deep venous thrombosis) ([place]) 2011/2012    chronic coumadin, h/o blood clots on coumadin.  . Dementia   . Essential hypertension, benign   . Esophageal reflux   . GERD (gastroesophageal reflux disease)   . Unspecified prolapse of vaginal walls   . A-fib ([place])   . Esophageal dysphagia     limited food and pill    Past Surgical History  Procedure Laterality Date  . Abdominal hysterectomy    . Nasal septum surgery    . Tonsillectomy    . Colonoscopy w/ biopsies  02/01/2004    RMR: Anal papilla with normal rectum/Sigmoid diverticula/Small polyps in the cecum removed as described above. Inflammatory polyp  . Wrist fracture surgery    . Esophagogastroduodenoscopy      unknown year per patient  . Esophageal dilation      unknown year per patient.  . Esophagogastroduodenoscopy N/A 02/07/2015    RMR: Normal esophagus  status post  Maloney dilation. Small hiatal hernia  . Esophageal dilation N/A 02/07/2015    Procedure: ESOPHAGEAL DILATION;  Surgeon: Daneil Dolin, MD;  Location: [facility];  Service: Endoscopy;  Laterality: N/A;    Family History  Problem Relation Age of Onset  . Colon polyps Neg Hx   . Colon cancer Neg Hx     Social History Social History  Substance Use Topics  . Smoking status: Never Smoker   . Smokeless tobacco: Never Used  . Alcohol Use: No    Allergies  Allergen Reactions  . Latex Itching and Rash  . Adhesive [Tape] Other (See Comments)    Tears skin  . Ciprofloxacin Nausea And Vomiting  . Codeine Nausea And Vomiting    Upsets stomach, nightmares  . Penicillins Hives  . Sulfa Antibiotics Hives    Current Outpatient Prescriptions  Medication Sig Dispense Refill  . acetaminophen (TYLENOL) 500 MG tablet Take 1,000 mg by mouth 2 (two) times daily as needed for mild pain, moderate pain or headache.     Marland Kitchen apixaban (ELIQUIS) 5 MG TABS tablet Take 1 tablet (5 mg total) by mouth 2 (two) times daily. 180 tablet 3  . CALCIUM PO Take by mouth.    . DULoxetine (CYMBALTA) 60 MG capsule Take 60 mg by mouth daily.     Marland Kitchen esomeprazole (NEXIUM) 40 MG capsule Take 40 mg by mouth daily.     . Fluticasone-Salmeterol (ADVAIR) 250-50 MCG/DOSE AEPB Inhale 1 puff into the lungs daily.     Marland Kitchen  Lido-Capsaicin-Men-Methyl Sal 0.5-0.035-5-20 % PTCH Apply topically.    . metoprolol tartrate (LOPRESSOR) 25 MG tablet Take 1.5 tablets (37.5 mg total) by mouth 2 (two) times daily. 90 tablet 6  . pyridOXINE (VITAMIN B-6) 50 MG tablet Take 50 mg by mouth daily.    . vitamin B-12 (CYANOCOBALAMIN) 1000 MCG tablet Take 1,000 mcg by mouth daily.     No current facility-administered medications for this visit.       Physical Exam Physical Exam Blood pressure 142/66, height 5\' 2"  (1.575 m), weight 144 lb (65.318 kg). Appearance, there are no abnormalities in terms of appearance the patient was well-developed and well-nourished. The grooming and hygiene were normal.  Mental status orientation, there was normal alertness and  orientation Mood pleasant Ambulatory status normal with no assistive devices  Examination of the right shoulder Inspection . Acromial tenderness Range of motion active flexion 150 normal internal rotation Tests for stability stable in abduction external rotation Motor strength  mild weakness is supraspinous tendon Skin warm dry and intact without laceration or ulceration or erythema Neurologic examination normal sensation Vascular examination normal pulses with warm extremity and normal capillary refill  The opposite extremity normal range of motion of the shoulder normal strength  Right hand A1 pulley tender over the A1 pulley of the right long finger no catching or locking full range of motion no instability    Data Reviewed 3 views right shoulder no significant abnormality noted  3 views right hand we see degenerative changes in the IP joints and somewhat in the carpal bones  Assessment  Rotator cuff syndrome right shoulder recommend subacromial injection and home exercises  Procedure note the subacromial injection shoulder RIGHT  Verbal consent was obtained to inject the  RIGHT   Shoulder  Timeout was completed to confirm the injection site is a subacromial space of the  RIGHT  shoulder   Medication used Depo-Medrol 40 mg and lidocaine 1% 3 cc  Anesthesia was provided by ethyl chloride  The injection was performed in the RIGHT  posterior subacromial space. After pinning the skin with alcohol and anesthetized the skin with ethyl chloride the subacromial space was injected using a 20-gauge needle. There were no complications  Sterile dressing was applied.  Right long finger triggering Trigger finger injection  Diagnosis  right long finger triggering Procedure injection A1 pulley Medications lidocaine 1% 1 mL and Depo-Medrol 40 mg 1 mL Skin prep alcohol and ethyl chloride Verbal consent was obtained Timeout confirmed the injection site  After cleaning the skin  with alcohol and anesthetizing the skin with ethyl chloride the A1 pulley was palpated and the injection was performed without complication   Plan  As above follow-up as needed

## 2015-11-06 ENCOUNTER — Ambulatory Visit (INDEPENDENT_AMBULATORY_CARE_PROVIDER_SITE_OTHER): Payer: Medicare HMO | Admitting: Obstetrics & Gynecology

## 2015-11-06 ENCOUNTER — Encounter: Payer: Self-pay | Admitting: Obstetrics & Gynecology

## 2015-11-06 VITALS — BP 160/80 | HR 64 | Wt 140.4 lb

## 2015-11-06 DIAGNOSIS — N3941 Urge incontinence: Secondary | ICD-10-CM | POA: Diagnosis not present

## 2015-11-06 DIAGNOSIS — N993 Prolapse of vaginal vault after hysterectomy: Secondary | ICD-10-CM | POA: Diagnosis not present

## 2015-11-06 MED ORDER — OXYBUTYNIN CHLORIDE 5 MG PO TABS: 5.0000 mg | ORAL_TABLET | Freq: Every day | ORAL | Status: DC

## 2015-11-06 NOTE — Progress Notes (Signed)
Patient ID: Sara Garcia, female   DOB: 07/23/1928, 82 y.o.   MRN: [id number] Chief Complaint  Patient presents with  . Follow-up    want pessary out for a while    Blood pressure 160/80, pulse 64, weight 140 lb 6.4 oz (63.685 kg).  Sara Garcia presents today for routine follow up related to her pessary.   She uses a milex ring with support #2 She reports no vaginal discharge or vaginal bleeding.  Exam reveals no undue vaginal mucosal pressure of breakdown, no discharge and no vaginal bleeding.  The pessary is removed, cleaned and  Not replaced per pt request  Sara Garcia will be sen back in prn months for continued follow up.  Ill see if insurance will cover plain ditropan 5 mg qhs  Florian Buff, MD  11/06/2015 2:44 PM

## 2015-11-09 ENCOUNTER — Encounter: Payer: Self-pay | Admitting: Nurse Practitioner

## 2015-11-09 ENCOUNTER — Ambulatory Visit (INDEPENDENT_AMBULATORY_CARE_PROVIDER_SITE_OTHER): Payer: Medicare HMO | Admitting: Nurse Practitioner

## 2015-11-09 VITALS — BP 112/64 | HR 59 | Temp 98.3°F | Ht 62.0 in | Wt 140.4 lb

## 2015-11-09 DIAGNOSIS — K5900 Constipation, unspecified: Secondary | ICD-10-CM | POA: Diagnosis not present

## 2015-11-09 DIAGNOSIS — D509 Iron deficiency anemia, unspecified: Secondary | ICD-10-CM

## 2015-11-09 MED ORDER — FERROUS SULFATE 325 (65 FE) MG PO TBEC: 325.0000 mg | DELAYED_RELEASE_TABLET | Freq: Three times a day (TID) | ORAL | Status: DC

## 2015-11-09 MED ORDER — POLYETHYLENE GLYCOL 3350 17 GM/SCOOP PO POWD: ORAL | Status: DC

## 2015-11-09 NOTE — Assessment & Plan Note (Signed)
Patient with a chronic history of constipation. We'll have her start daily Colace stool softener and MiraLAX once daily 3 times a week to titrate up to once daily 7 days a week as needed for constipation. Return for follow-up in 2 months.

## 2015-11-09 NOTE — Assessment & Plan Note (Signed)
Issue with iron deficiency anemia on labs drawn by PCP. Patient had an endoscopy last April which showed small hiatal hernia and status post [facility] dilation. Last colonoscopy was 10 years ago with no further routine screening needed. Recommended to her for a colonoscopy and possible endoscopy as needed for evaluation for possible GI bleed. She is very reluctant to have colonoscopy at this time. She is amenable to iFOBT at home. We will have her bring this back to [place] and check it for blood. In the meantime I will start her on iron sulfate 325 mg 3 times a day to help replete her iron stores. Return for follow-up in 2 months and we can discuss further options.

## 2015-11-09 NOTE — Progress Notes (Signed)
cc'ed to pcp °

## 2015-11-09 NOTE — Patient Instructions (Signed)
1. Take over-the-counter Colace stool softener once a day. 2. Take MiraLAX powder once a day, 3 times a week as needed for constipation. You can increase this as needed to once a day for constipation. 3. Take iron sulfate 325 mg pills 3 times a day. 4. When you have a stool test done bring it back to [place] so we can have it checked. 5. Return for follow-up in 2 months or sooner if you need [place].

## 2015-11-09 NOTE — Progress Notes (Signed)
Referring Provider: Doree Albee, MD Primary Care Physician:  Doree Albee, MD Primary GI:  Dr. Gala Romney  Chief Complaint  Patient presents with  . Anemia    HPI:   82 year old female presents for referral by PCP of anemia. Last seen by PCP on 10/17/2015 at which point she states she was concerned about her anemia that was discovered on her last visit even oh was mild. At that time she denied any bleeding of the GI tract. Does have GERD. Right lung lesion on CT of the chest which was repeated in December 2015 and showed decrease in size. Recently had an endoscopy 02/07/2015 with Venia Minks dilation which found normal esophagus status post dilation, small hiatal hernia. Recommended continue Nexium 40 daily and resume anticoagulation after procedure under direction of Dr. Harl Bowie. Labs provided with office notes which are drawn on 01/01/2016 show mild anemia with hemoglobin of 11.3, low total iron 28, low U IBC 4:15, low iron sat percentage at 6, low ferritin at 8. Last colonoscopy completed 02/01/2004 for cancer screening. Findings include an anal papilla with normal rectum, sigmoid diverticula, small polyps in the cecum which were removed. No surgical pathology report could be found.  Today she states she feels tired, which is not new. No sudden onset fatigue. Denies abdominal pain, N/V, hematochezia, melena. GERD symptoms are "so-so" which she states is mostly her her fault because she doesn't follow a GERD diet strictly. Worsened by tomato sauce, spicy foods. Flares occur "everytime I eat something I shouldn't." She has been told what diet changes to make, but hasn't been able to make all the changes. She would prefer natural (ie- diet) treatments rather then medications. Has chronic constipation, tried to eat fiber foods, drinks about 5-6 bottles of water a day, only drinks water. Denies fever, chills, unintentional weight loss. Denies chest pain, dyspnea, dizziness, lightheadedness, syncope,  near syncope. Denies any other upper or lower GI symptoms.  Past Medical History  Diagnosis Date  . Arthritis   . Asthma   . Osteoporosis   . DVT (deep venous thrombosis) ([place]) 2011/2012    chronic coumadin, h/o blood clots on coumadin.  . Dementia   . Essential hypertension, benign   . Esophageal reflux   . GERD (gastroesophageal reflux disease)   . Unspecified prolapse of vaginal walls   . A-fib ([place])   . Esophageal dysphagia     limited food and pill    Past Surgical History  Procedure Laterality Date  . Abdominal hysterectomy    . Nasal septum surgery    . Tonsillectomy    . Colonoscopy w/ biopsies  02/01/2004    RMR: Anal papilla with normal rectum/Sigmoid diverticula/Small polyps in the cecum removed as described above. Inflammatory polyp  . Wrist fracture surgery    . Esophagogastroduodenoscopy      unknown year per patient  . Esophageal dilation      unknown year per patient.  . Esophagogastroduodenoscopy N/A 02/07/2015    RMR: Normal esophagus  status post  Maloney dilation. Small hiatal hernia  . Esophageal dilation N/A 02/07/2015    RMR: Normal esophagus status post maloney dilation. Small hiatal hernia.     Current Outpatient Prescriptions  Medication Sig Dispense Refill  . acetaminophen (TYLENOL) 500 MG tablet Take 1,000 mg by mouth 2 (two) times daily as needed for mild pain, moderate pain or headache.     Marland Kitchen apixaban (ELIQUIS) 5 MG TABS tablet Take 1 tablet (5 mg total) by mouth 2 (two)  times daily. 180 tablet 3  . CALCIUM PO Take by mouth.    . DULoxetine (CYMBALTA) 60 MG capsule Take 60 mg by mouth daily.     Marland Kitchen esomeprazole (NEXIUM) 40 MG capsule Take 40 mg by mouth daily.     . Fluticasone-Salmeterol (ADVAIR) 250-50 MCG/DOSE AEPB Inhale 1 puff into the lungs daily.     . Lido-Capsaicin-Men-Methyl Sal 0.5-0.035-5-20 % PTCH Apply topically.    . metoprolol tartrate (LOPRESSOR) 25 MG tablet Take 1.5 tablets (37.5 mg total) by mouth 2 (two) times daily. 90  tablet 6  . oxybutynin (DITROPAN) 5 MG tablet Take 1 tablet (5 mg total) by mouth at bedtime. 30 tablet 11  . pyridOXINE (VITAMIN B-6) 50 MG tablet Take 50 mg by mouth daily.    . vitamin B-12 (CYANOCOBALAMIN) 1000 MCG tablet Take 1,000 mcg by mouth daily.     No current facility-administered medications for this visit.    Allergies as of 11/09/2015 - Review Complete 11/09/2015  Allergen Reaction Noted  . Latex Itching and Rash 10/20/2013  . Adhesive [tape] Other (See Comments) 10/20/2013  . Ciprofloxacin Nausea And Vomiting 10/20/2013  . Codeine Nausea And Vomiting 10/20/2013  . Penicillins Hives 10/20/2013  . Sulfa antibiotics Hives 10/20/2013    Family History  Problem Relation Age of Onset  . Colon polyps Neg Hx   . Colon cancer Neg Hx     Social History   Social History  . Marital Status: Married    Spouse Name: N/A  . Number of Children: N/A  . Years of Education: N/A   Social History Main Topics  . Smoking status: Never Smoker   . Smokeless tobacco: Never Used  . Alcohol Use: No  . Drug Use: No  . Sexual Activity: Not Currently    Birth Control/ Protection: None   Other Topics Concern  . None   Social History Narrative    Review of Systems: General: Negative for anorexia, weight loss, fever, chills. Eyes: Negative for vision changes.  ENT: Negative for hoarseness, difficulty swallowing. CV: Negative for chest pain, angina, palpitations, peripheral edema.  Respiratory: Negative for dyspnea at rest, cough, sputum, wheezing.  GI: See history of present illness. Endo: Negative for unusual weight change.  Heme: Negative for bruising or bleeding.   Physical Exam: BP 112/64 mmHg  Pulse 59  Temp(Src) 98.3 F (36.8 C) (Oral)  Ht 5\' 2"  (1.575 m)  Wt 140 lb 6.4 oz (63.685 kg)  BMI 25.67 kg/m2 General:   Alert and oriented. Pleasant and cooperative. Well-nourished and well-developed.  Head:  Normocephalic and atraumatic. Eyes:  Without icterus, sclera  clear and conjunctiva pink.  Ears:  Normal auditory acuity. Cardiovascular:  S1, S2 present without murmurs appreciated. Extremities without clubbing or edema. Respiratory:  Clear to auscultation bilaterally. No wheezes, rales, or rhonchi. No distress.  Gastrointestinal:  +BS, soft, non-tender and non-distended. No HSM noted. No guarding or rebound. No masses appreciated.  Rectal:  Deferred  Neurologic:  Alert and oriented x4;  grossly normal neurologically. Psych:  Alert and cooperative. Normal mood and affect. Heme/Lymph/Immune: No excessive bruising noted.    11/09/2015 12:01 PM

## 2015-11-14 ENCOUNTER — Ambulatory Visit (INDEPENDENT_AMBULATORY_CARE_PROVIDER_SITE_OTHER): Payer: Medicare HMO

## 2015-11-14 DIAGNOSIS — D509 Iron deficiency anemia, unspecified: Secondary | ICD-10-CM | POA: Diagnosis not present

## 2015-11-14 LAB — IFOBT (OCCULT BLOOD): IFOBT: NEGATIVE

## 2015-11-14 NOTE — Progress Notes (Signed)
Pt returned IFOBT test and it was negative 

## 2015-11-15 NOTE — Progress Notes (Signed)
Noted. Please notify the patient and continue the plan for 2 month follow-up.

## 2015-12-19 ENCOUNTER — Telehealth: Payer: Self-pay | Admitting: Internal Medicine

## 2015-12-19 ENCOUNTER — Encounter: Payer: Self-pay | Admitting: Internal Medicine

## 2015-12-19 NOTE — Telephone Encounter (Signed)
Requested labs from Dr Anastasio Champion

## 2015-12-19 NOTE — Telephone Encounter (Signed)
Sara Garcia, please try to get a copy of the labs done and we can get EG to take a look at them.

## 2015-12-19 NOTE — Telephone Encounter (Signed)
Pt called today asking if EG would call Dr Lanice Shirts office and talk to him about her labs and if she needs to continue taking her iron pills. I told her that she should call Dr Lanice Shirts office and ask to speak with his nurse. She said that the last time she was there it "didn't go well" and no one would return her calls. I reminded her that she has OV with EG on 3/20 and I would let the provider be aware of her concern. Please call [phone]

## 2015-12-20 NOTE — Telephone Encounter (Signed)
Noted. I'll look at them when we get them.

## 2015-12-22 NOTE — Telephone Encounter (Signed)
Please tell the patient I received her labs. She is still mildly anemic with low iron and ferritin. She should continue taking her iron as previously directed. Recommend follow-up with PCP as needed.

## 2015-12-25 ENCOUNTER — Telehealth: Payer: Self-pay | Admitting: Cardiology

## 2015-12-25 NOTE — Telephone Encounter (Signed)
Discussed below with patient & answered multiple questions regarding her medication.  Patient had no c/o fever, chest pain, dizziness, SOB, or edema.  Stated she did have nausea with a bitter / bad taste in her mouth.  She also c/o feet & hands being very cold.  Did have diarrhea x 1 this morning.  BP this morning at 8:45 was 148/74  53 & later in the evening BP was 141/75  58.  Explained to her that I did not think that these were cardiac related symptoms.  Informed patient that there is a lot of GI virus & flu going around.  Review of chart does show that patient has history of anemia & is on B12 replacement medication.  She had multiple questions about this & also about her new antidepressant.  Suggested she should see her PMD tomorrow as these are all questions / issues that they would need to handle.  Also, suggested that if she does not feel that she can wait till tomorrow, she should go to ED for evaluation.  Patient was appreciative of the info & verbalized understanding.

## 2015-12-25 NOTE — Telephone Encounter (Signed)
Patient has been sick this past week. Has been really nauseous and feet and hands are extremely cold.  Patient is not sure what she needs to do  Concerned with her B12 being high. unsure

## 2015-12-26 NOTE — Telephone Encounter (Signed)
Pt is aware. She is also aware of her upcoming appt with [place] on the 22nd.

## 2015-12-28 ENCOUNTER — Telehealth: Payer: Self-pay | Admitting: *Deleted

## 2015-12-28 NOTE — Telephone Encounter (Signed)
Pt states Dr. Elonda Husky prescribed Oxybutynin. Pt states she thinks the medication is causing vaginal dryness, sinus dryness. Pt states would like to discuss with Dr.Eure stopping this med. Pt states she also has been sick with diarrhea, vomiting for several weeks, PCP treating her for a viral infection. Pt also wonders if this medication could be the cause for this? Please advise.

## 2015-12-29 NOTE — Telephone Encounter (Signed)
Ditropan could cause her dryness symptoms should not cause the diarrhea, N/V

## 2016-01-01 ENCOUNTER — Ambulatory Visit: Payer: Medicare HMO | Admitting: Nurse Practitioner

## 2016-01-01 ENCOUNTER — Ambulatory Visit: Payer: Medicare HMO | Admitting: Obstetrics & Gynecology

## 2016-01-01 NOTE — Telephone Encounter (Signed)
Pt informed per Dr.Eure, Ditropan could cause the symptoms of dryness, but not the N/V. Pt states she will need to r/s her appt with Dr.Eure this pm due to transportation issues. Pt rescheduled for 01/03/2016 at 2:45 pm.

## 2016-01-03 ENCOUNTER — Encounter: Payer: Self-pay | Admitting: Obstetrics & Gynecology

## 2016-01-03 ENCOUNTER — Ambulatory Visit (INDEPENDENT_AMBULATORY_CARE_PROVIDER_SITE_OTHER): Payer: Medicare HMO | Admitting: Obstetrics & Gynecology

## 2016-01-03 ENCOUNTER — Ambulatory Visit (INDEPENDENT_AMBULATORY_CARE_PROVIDER_SITE_OTHER): Payer: Medicare HMO | Admitting: Nurse Practitioner

## 2016-01-03 ENCOUNTER — Encounter: Payer: Self-pay | Admitting: Nurse Practitioner

## 2016-01-03 VITALS — BP 130/80 | HR 60 | Wt 141.0 lb

## 2016-01-03 VITALS — BP 117/70 | HR 65 | Temp 97.8°F | Ht 62.0 in | Wt 138.2 lb

## 2016-01-03 DIAGNOSIS — N993 Prolapse of vaginal vault after hysterectomy: Secondary | ICD-10-CM

## 2016-01-03 DIAGNOSIS — R197 Diarrhea, unspecified: Secondary | ICD-10-CM | POA: Insufficient documentation

## 2016-01-03 DIAGNOSIS — K5900 Constipation, unspecified: Secondary | ICD-10-CM | POA: Diagnosis not present

## 2016-01-03 NOTE — Assessment & Plan Note (Signed)
Patient with noted constipation, has daily bowel movements which are not as productive as she would like and with hard stools. She admittedly is not taking MiraLAX and Colace as previously directed. Reinforced these instructions to her. Return for follow-up in 3 months.

## 2016-01-03 NOTE — Patient Instructions (Signed)
1. Take medicines for constipation as written below. 2. Take a probiotic once a day in the morning for 30 days. I'm giving the samples to last all 30 days. 3. You can take Phazyme over-the-counter for excessive belching.  4. Return for follow-up in 3 months.   For Constipation:  Take 1 capful of MiraLAX and mix it into a beverage of your choice once a day in the morning.  Take 1 Colace stool softener, once a day, in the morning.  Call with any questions.

## 2016-01-03 NOTE — Assessment & Plan Note (Signed)
I suspect her diarrhea is overflow diarrhea related to her constipation. She also had a stomach virus or gastroenteritis recently which is caused some diarrhea, nausea, vomiting; although this seems resolved. We'll start her on a daily probiotic to help for post gastroenteritis symptoms. I suspect when her constipation is better managed she will have a decrease in overflow diarrhea. Return for follow-up in 3 months. No other red flag/warning signs or symptoms.

## 2016-01-03 NOTE — Progress Notes (Signed)
Referring Provider: Doree Albee, MD Primary Care Physician:  Doree Albee, MD Primary GI:  Dr. Gala Romney  Chief Complaint  Patient presents with  . Constipation  . Diarrhea    HPI:   Sara Garcia is a 82 y.o. female who presents for follow-up on constipation and diarrhea. She was last seen in our office 11/09/2015 at which point she was having constipation symptoms which is chronic for her. She started on Colace once daily and MiraLAX once daily 3 times a week, titrate up to once a day as needed. Related to her iron deficiency anemia at that time it was noted recent endoscopy the previous April showed small hiatal hernia status post Maloney dilation and colonoscopy 10 years ago with no further routine screening needed. Recommended repeat EGD and possible endoscopy for anemia, which she was reluctant to do. She agreed to do an iFob test at home which was negative.  Today she states she is having both constipation and diarrhea. Not taking Miralax like she should, states she hasn't gotten on it, is unsure of when to take it. Describes pattern of constipation and hard stool with subsequent episodes of diarrhea. Has been sick lately with a stomach virus and was having nausea and vomiting. Is better now. Denies hematochezia, melena. Has a bowel movement almost daily but stools are hard and require straining to pass. Later in the day will have loose stools. She brought labs today showing low iron and anemia despite iron supplementation.  Past Medical History  Diagnosis Date  . Arthritis   . Asthma   . Osteoporosis   . DVT (deep venous thrombosis) ([place]) 2011/2012    chronic coumadin, h/o blood clots on coumadin.  . Dementia   . Essential hypertension, benign   . Esophageal reflux   . GERD (gastroesophageal reflux disease)   . Unspecified prolapse of vaginal walls   . A-fib ([place])   . Esophageal dysphagia     limited food and pill    Past Surgical History  Procedure Laterality Date    . Abdominal hysterectomy    . Nasal septum surgery    . Tonsillectomy    . Colonoscopy w/ biopsies  02/01/2004    RMR: Anal papilla with normal rectum/Sigmoid diverticula/Small polyps in the cecum removed as described above. Inflammatory polyp  . Wrist fracture surgery    . Esophagogastroduodenoscopy      unknown year per patient  . Esophageal dilation      unknown year per patient.  . Esophagogastroduodenoscopy N/A 02/07/2015    RMR: Normal esophagus  status post  Maloney dilation. Small hiatal hernia  . Esophageal dilation N/A 02/07/2015    RMR: Normal esophagus status post maloney dilation. Small hiatal hernia.     Current Outpatient Prescriptions  Medication Sig Dispense Refill  . acetaminophen (TYLENOL) 500 MG tablet Take 1,000 mg by mouth 2 (two) times daily as needed for mild pain, moderate pain or headache.     Marland Kitchen apixaban (ELIQUIS) 5 MG TABS tablet Take 1 tablet (5 mg total) by mouth 2 (two) times daily. 180 tablet 3  . buPROPion (WELLBUTRIN SR) 150 MG 12 hr tablet Take 150 mg by mouth 2 (two) times daily.    Marland Kitchen CALCIUM PO Take by mouth.    . esomeprazole (NEXIUM) 40 MG capsule Take 40 mg by mouth daily.     . ferrous sulfate 325 (65 FE) MG EC tablet Take 1 tablet (325 mg total) by mouth 3 (three) times daily  with meals. 90 tablet 3  . Fluticasone-Salmeterol (ADVAIR) 250-50 MCG/DOSE AEPB Inhale 1 puff into the lungs daily.     . Lido-Capsaicin-Men-Methyl Sal 0.5-0.035-5-20 % PTCH Apply topically.    . metoprolol tartrate (LOPRESSOR) 25 MG tablet Take 1.5 tablets (37.5 mg total) by mouth 2 (two) times daily. 90 tablet 6  . polyethylene glycol powder (GLYCOLAX/MIRALAX) powder Take 1 capful, once a day, three times a week. Increase up to once a day as needed for constipation. 255 g 3  . pyridOXINE (VITAMIN B-6) 50 MG tablet Take 50 mg by mouth daily.    . vitamin B-12 (CYANOCOBALAMIN) 1000 MCG tablet Take 1,000 mcg by mouth daily.    . DULoxetine (CYMBALTA) 60 MG capsule Take 60 mg  by mouth daily. Reported on 01/03/2016    . oxybutynin (DITROPAN) 5 MG tablet Take 1 tablet (5 mg total) by mouth at bedtime. (Patient not taking: Reported on 01/03/2016) 30 tablet 11   No current facility-administered medications for this visit.    Allergies as of 01/03/2016 - Review Complete 01/03/2016  Allergen Reaction Noted  . Latex Itching and Rash 10/20/2013  . Adhesive [tape] Other (See Comments) 10/20/2013  . Ciprofloxacin Nausea And Vomiting 10/20/2013  . Codeine Nausea And Vomiting 10/20/2013  . Penicillins Hives 10/20/2013  . Sulfa antibiotics Hives 10/20/2013    Family History  Problem Relation Age of Onset  . Colon polyps Neg Hx   . Colon cancer Neg Hx     Social History   Social History  . Marital Status: Married    Spouse Name: N/A  . Number of Children: N/A  . Years of Education: N/A   Social History Main Topics  . Smoking status: Never Smoker   . Smokeless tobacco: Never Used  . Alcohol Use: No  . Drug Use: No  . Sexual Activity: Not Currently    Birth Control/ Protection: None   Other Topics Concern  . None   Social History Narrative    Review of Systems: General: Negative for anorexia, weight loss, fever, chills, fatigue, weakness. Eyes: Negative for vision changes.  ENT: Negative for hoarseness, difficulty swallowing. CV: Negative for chest pain, angina, palpitations, peripheral edema.  Respiratory: Negative for dyspnea at rest, cough, sputum, wheezing.  GI: See history of present illness. Endo: Negative for unusual weight change.    Physical Exam: BP 117/70 mmHg  Pulse 65  Temp(Src) 97.8 F (36.6 C) (Oral)  Ht 5\' 2"  (1.575 m)  Wt 138 lb 3.2 oz (62.687 kg)  BMI 25.27 kg/m2 General:   Alert and oriented. Pleasant and cooperative. Well-nourished and well-developed.  Head:  Normocephalic and atraumatic. Eyes:  Without icterus, sclera clear and conjunctiva pink.  Cardiovascular:  S1, S2 present without murmurs appreciated. Extremities  without clubbing or edema. Respiratory:  Clear to auscultation bilaterally. No wheezes, rales, or rhonchi. No distress.  Gastrointestinal:  +BS, soft, non-tender and non-distended. No HSM noted. No guarding or rebound. No masses appreciated.  Rectal:  Deferred  Musculoskalatal:  Symmetrical without gross deformities. Skin:  Intact without significant lesions or rashes. Neurologic:  Alert and oriented x4;  grossly normal neurologically. Psych:  Alert and cooperative. Normal mood and affect. Heme/Lymph/Immune: No excessive bruising noted.    01/03/2016 2:30 PM   Disclaimer: This note was dictated with voice recognition software. Similar sounding words can inadvertently be transcribed and may not be corrected upon review.

## 2016-01-03 NOTE — Progress Notes (Signed)
Patient ID: Sara Garcia, female   DOB: [birth year], 82 y.o.   MRN: [id number] Patient ID: Sara Garcia, female   DOB: 1927/12/27, 82 y.o.   MRN: [id number] Chief Complaint  Patient presents with  . gyn visit    reinsert pessary    Blood pressure 130/80, pulse 60, weight 141 lb (63.957 kg).  Sara Garcia presents today for routine follow up related to her pessary.   She uses a milex ring #2 She reports no vaginal discharge or vaginal bleeding.  Exam reveals no undue vaginal mucosal pressure of breakdown, no discharge and no vaginal bleeding.  The pessary is removed, cleaned and replaced without difficulty.    Sara Garcia will be sen back in 4 months for continued follow up.  Florian Buff, MD   01/03/2016 3:13 PM

## 2016-01-04 NOTE — Progress Notes (Signed)
CC'ED TO PCP 

## 2016-01-12 ENCOUNTER — Other Ambulatory Visit: Payer: Self-pay | Admitting: *Deleted

## 2016-01-12 MED ORDER — APIXABAN 5 MG PO TABS: 5.0000 mg | ORAL_TABLET | Freq: Two times a day (BID) | ORAL | Status: DC

## 2016-01-15 ENCOUNTER — Ambulatory Visit (INDEPENDENT_AMBULATORY_CARE_PROVIDER_SITE_OTHER): Payer: Medicare HMO | Admitting: Cardiology

## 2016-01-15 ENCOUNTER — Encounter: Payer: Self-pay | Admitting: Cardiology

## 2016-01-15 VITALS — BP 148/92 | HR 103 | Ht 62.0 in | Wt 138.4 lb

## 2016-01-15 DIAGNOSIS — I4891 Unspecified atrial fibrillation: Secondary | ICD-10-CM | POA: Diagnosis not present

## 2016-01-15 DIAGNOSIS — I10 Essential (primary) hypertension: Secondary | ICD-10-CM | POA: Diagnosis not present

## 2016-01-15 DIAGNOSIS — Z86718 Personal history of other venous thrombosis and embolism: Secondary | ICD-10-CM

## 2016-01-15 NOTE — Progress Notes (Signed)
Patient ID: [name], female   DOB: 06/05/1928, 82 y.o.   MRN: [id number]     Clinical Summary Ms. Traeger is a 82 y.o.female seen today for follow up of the following medical problems.   1. Parox afib - on beta blocker for rate control, eliquis stroke prophylaxis. - previously we had icreased her lopressor to 50mg  bid from 37.5 mg bid. With change she reported worsening fluttering in her chest and fatigue, we cut dose back to 37.5mg  bid.and she felt better   - can have some occasional palpitations, but overall infrequent.  - denies any bleeding issues on eliquis.   2. Dysphagia - followed by GI  3. HTN - does not check at home. Last few clinic visits with other providers last month bp at goal.  - compliant with meds  4. Hx of DVT - on eliquis, denies any bleeding issues.    Past Medical History  Diagnosis Date  . Arthritis   . Asthma   . Osteoporosis   . DVT (deep venous thrombosis) ([place]) 2011/2012    chronic coumadin, h/o blood clots on coumadin.  . Dementia   . Essential hypertension, benign   . Esophageal reflux   . GERD (gastroesophageal reflux disease)   . Unspecified prolapse of vaginal walls   . A-fib ([place])   . Esophageal dysphagia     limited food and pill     Allergies  Allergen Reactions  . Latex Itching and Rash  . Adhesive [Tape] Other (See Comments)    Tears skin  . Ciprofloxacin Nausea And Vomiting  . Codeine Nausea And Vomiting    Upsets stomach, nightmares  . Penicillins Hives  . Sulfa Antibiotics Hives     Current Outpatient Prescriptions  Medication Sig Dispense Refill  . acetaminophen (TYLENOL) 500 MG tablet Take 1,000 mg by mouth 2 (two) times daily as needed for mild pain, moderate pain or headache.     Marland Kitchen apixaban (ELIQUIS) 5 MG TABS tablet Take 1 tablet (5 mg total) by mouth 2 (two) times daily. 42 tablet 0  . buPROPion (WELLBUTRIN SR) 150 MG 12 hr tablet Take 150 mg by mouth 2 (two) times daily.    Marland Kitchen CALCIUM PO Take by mouth.    .  DULoxetine (CYMBALTA) 60 MG capsule Take 60 mg by mouth daily. Reported on 01/03/2016    . esomeprazole (NEXIUM) 40 MG capsule Take 40 mg by mouth daily.     . ferrous sulfate 325 (65 FE) MG EC tablet Take 1 tablet (325 mg total) by mouth 3 (three) times daily with meals. 90 tablet 3  . Fluticasone-Salmeterol (ADVAIR) 250-50 MCG/DOSE AEPB Inhale 1 puff into the lungs daily.     . Lido-Capsaicin-Men-Methyl Sal 0.5-0.035-5-20 % PTCH Apply topically.    . metoprolol tartrate (LOPRESSOR) 25 MG tablet Take 1.5 tablets (37.5 mg total) by mouth 2 (two) times daily. 90 tablet 6  . oxybutynin (DITROPAN) 5 MG tablet Take 1 tablet (5 mg total) by mouth at bedtime. (Patient not taking: Reported on 01/03/2016) 30 tablet 11  . polyethylene glycol powder (GLYCOLAX/MIRALAX) powder Take 1 capful, once a day, three times a week. Increase up to once a day as needed for constipation. 255 g 3  . pyridOXINE (VITAMIN B-6) 50 MG tablet Take 50 mg by mouth daily.    . vitamin B-12 (CYANOCOBALAMIN) 1000 MCG tablet Take 1,000 mcg by mouth daily.     No current facility-administered medications for this visit.     Past  Surgical History  Procedure Laterality Date  . Abdominal hysterectomy    . Nasal septum surgery    . Tonsillectomy    . Colonoscopy w/ biopsies  02/01/2004    RMR: Anal papilla with normal rectum/Sigmoid diverticula/Small polyps in the cecum removed as described above. Inflammatory polyp  . Wrist fracture surgery    . Esophagogastroduodenoscopy      unknown year per patient  . Esophageal dilation      unknown year per patient.  . Esophagogastroduodenoscopy N/A 02/07/2015    RMR: Normal esophagus  status post  Maloney dilation. Small hiatal hernia  . Esophageal dilation N/A 02/07/2015    RMR: Normal esophagus status post maloney dilation. Small hiatal hernia.      Allergies  Allergen Reactions  . Latex Itching and Rash  . Adhesive [Tape] Other (See Comments)    Tears skin  . Ciprofloxacin Nausea  And Vomiting  . Codeine Nausea And Vomiting    Upsets stomach, nightmares  . Penicillins Hives  . Sulfa Antibiotics Hives      Family History  Problem Relation Age of Onset  . Colon polyps Neg Hx   . Colon cancer Neg Hx      Social History Ms. Rusk reports that she has never smoked. She has never used smokeless tobacco. Ms. Snuffer reports that she does not drink alcohol.   Review of Systems CONSTITUTIONAL: No weight loss, fever, chills, weakness or fatigue.  HEENT: Eyes: No visual loss, blurred vision, double vision or yellow sclerae.No hearing loss, sneezing, congestion, runny nose or sore throat.  SKIN: No rash or itching.  CARDIOVASCULAR: per HPI RESPIRATORY: No shortness of breath, cough or sputum.  GASTROINTESTINAL: No anorexia, nausea, vomiting or diarrhea. No abdominal pain or blood.  GENITOURINARY: No burning on urination, no polyuria NEUROLOGICAL: No headache, dizziness, syncope, paralysis, ataxia, numbness or tingling in the extremities. No change in bowel or bladder control.  MUSCULOSKELETAL: No muscle, back pain, joint pain or stiffness.  LYMPHATICS: No enlarged nodes. No history of splenectomy.  PSYCHIATRIC: No history of depression or anxiety.  ENDOCRINOLOGIC: No reports of sweating, cold or heat intolerance. No polyuria or polydipsia.  Marland Kitchen   Physical Examination Filed Vitals:   01/15/16 1301  BP: 148/92  Pulse: 103   Filed Vitals:   01/15/16 1301  Height: 5\' 2"  (1.575 m)  Weight: 138 lb 6.4 oz (62.778 kg)    Gen: resting comfortably, no acute distress HEENT: no scleral icterus, pupils equal round and reactive, no palptable cervical adenopathy,  CV: RRR, no m/r/g, no jvd Resp: Clear to auscultation bilaterally GI: abdomen is soft, non-tender, non-distended, normal bowel sounds, no hepatosplenomegaly MSK: extremities are warm, no edema.  Skin: warm, no rash Neuro:  no focal deficits Psych: appropriate affect   Diagnostic Studies 12/2013 Lexicscan  MPI Impression  Exercise Capacity: Lexiscan with no exercise.  BP Response: Normal blood pressure response.  Clinical Symptoms: Nausea  ECG Impression: No significant ST segment change suggestive of ischemia.  Comparison with Prior Nuclear Study: No previous nuclear study performed  Overall Impression: Normal stress nuclear study.  LV Ejection Fraction: 85%. LV Wall Motion: NL LV Function; NL Wall Motion    Jan 2015 echo Study Conclusions  - Study data: Technically difficult study. - Left ventricle: The cavity size was normal. Wall thickness was increased in a pattern of mild LVH. There is mild basal septal hypertrophy without obstructive gradient. Systolic function was normal. The estimated ejection fraction was in the range of 60% to  65%. Doppler parameters are consistent with abnormal left ventricular relaxation (grade 1 diastolic dysfunction). - Aortic valve: Mildly calcified annulus. Trileaflet; mildly thickened leaflets. Trivial regurgitation. Valve area: 1.69cm^2(VTI). Valve area: 1.79cm^2 (Vmax). - Mitral valve: Mildly calcified annulus. Mildly thickened leaflets .   01/2015 Holter monitor No significant arrhythmias    Assessment and Plan   1. Parox afib -did not tolerate lopressor 50 mg bid, decreased back to 37.5mg  bid and doing well. - overall doing well, continue current meds  2. HTN - elevated in clinic today, from chart review she has been controlled at her last several provider visits  we will continue current meds  3. Hx of DVT - continue eliquis        Arnoldo Lenis, M.D.

## 2016-01-15 NOTE — Patient Instructions (Signed)

## 2016-02-08 ENCOUNTER — Other Ambulatory Visit: Payer: Self-pay | Admitting: *Deleted

## 2016-02-08 MED ORDER — METOPROLOL TARTRATE 25 MG PO TABS: 37.5000 mg | ORAL_TABLET | Freq: Two times a day (BID) | ORAL | Status: DC

## 2016-02-08 MED ORDER — APIXABAN 5 MG PO TABS: 5.0000 mg | ORAL_TABLET | Freq: Two times a day (BID) | ORAL | Status: DC

## 2016-03-05 ENCOUNTER — Ambulatory Visit (INDEPENDENT_AMBULATORY_CARE_PROVIDER_SITE_OTHER): Payer: Medicare HMO | Admitting: *Deleted

## 2016-03-05 ENCOUNTER — Other Ambulatory Visit: Payer: Self-pay | Admitting: *Deleted

## 2016-03-05 DIAGNOSIS — Z5181 Encounter for therapeutic drug level monitoring: Secondary | ICD-10-CM

## 2016-03-05 DIAGNOSIS — I4891 Unspecified atrial fibrillation: Secondary | ICD-10-CM | POA: Diagnosis not present

## 2016-03-05 LAB — CBC
HCT: 40.4 % (ref 35.0–45.0)
Hemoglobin: 13.6 g/dL (ref 11.7–15.5)
MCH: 31.6 pg (ref 27.0–33.0)
MCHC: 33.7 g/dL (ref 32.0–36.0)
MCV: 93.7 fL (ref 80.0–100.0)
MPV: 10.1 fL (ref 7.5–12.5)
Platelets: 229 10*3/uL (ref 140–400)
RBC: 4.31 MIL/uL (ref 3.80–5.10)
RDW: 14.3 % (ref 11.0–15.0)
WBC: 8.2 10*3/uL (ref 3.8–10.8)

## 2016-03-05 MED ORDER — APIXABAN 5 MG PO TABS: 5.0000 mg | ORAL_TABLET | Freq: Two times a day (BID) | ORAL | Status: DC

## 2016-03-06 ENCOUNTER — Telehealth: Payer: Self-pay | Admitting: *Deleted

## 2016-03-06 LAB — BASIC METABOLIC PANEL
BUN: 19 mg/dL (ref 7–25)
CO2: 22 mmol/L (ref 20–31)
Calcium: 8.8 mg/dL (ref 8.6–10.4)
Chloride: 105 mmol/L (ref 98–110)
Creat: 0.81 mg/dL (ref 0.60–0.88)
Glucose, Bld: 91 mg/dL (ref 65–99)
Potassium: 4 mmol/L (ref 3.5–5.3)
Sodium: 140 mmol/L (ref 135–146)

## 2016-03-06 NOTE — Telephone Encounter (Signed)
Pt aware, routed to pcp 

## 2016-03-06 NOTE — Telephone Encounter (Signed)
-----   Message from Arnoldo Lenis, MD sent at 03/06/2016 12:55 PM EDT ----- Labs look good  Zandra Abts MD

## 2016-03-14 NOTE — Progress Notes (Signed)
Pt was started on Eliquis 5mg  bid for atrial fib on 02/03/15 by Dr Harl Bowie. Coumadin was discontinued.  Pt states she has been tolerating Eliquis well. No s/s of increased bruising, bleeding or GI upset.  Reviewed patients medication list. Pt is not currently on any combined P-gp and strong CYP3A4 inhibitors/inducers (ketoconazole, traconazole, ritonavir, carbamazepine, phenytoin, rifampin, St. John's wort). Reviewed labs from 03/05/16. SCr 0.81, Weight 138, CrCl 110.62. Dose is appropriate based on 2 out of 3 criteria (age, weight, SrCr). Hgb and HCT: 13.6/40.4  A full discussion of the nature of anticoagulants has been carried out. A benefit/risk analysis has been presented to the patient, so that they understand the justification for choosing anticoagulation with Eliquis at this time. The need for compliance is stressed. Pt is aware to take the medication twice daily. Side effects of potential bleeding are discussed, including unusual colored urine or stools, coughing up blood or coffee ground emesis, nose bleeds or serious fall or head trauma. Discussed signs and symptoms of stroke. The patient should avoid any OTC items containing aspirin or ibuprofen. Avoid alcohol consumption. Call if any signs of abnormal bleeding. Discussed financial obligations and resolved any difficulty in obtaining medication. Next lab test test in 6 months.  Pt notified of lab results/ Placed in recall.

## 2016-04-04 ENCOUNTER — Ambulatory Visit: Payer: Medicare HMO | Admitting: Nurse Practitioner

## 2016-04-04 ENCOUNTER — Telehealth: Payer: Self-pay | Admitting: Nurse Practitioner

## 2016-04-04 NOTE — Telephone Encounter (Signed)
Pt was a no show

## 2016-04-04 NOTE — Telephone Encounter (Signed)
Noted  

## 2016-04-05 ENCOUNTER — Other Ambulatory Visit: Payer: Self-pay | Admitting: Cardiology

## 2016-04-10 ENCOUNTER — Other Ambulatory Visit: Payer: Self-pay | Admitting: *Deleted

## 2016-04-10 MED ORDER — APIXABAN 5 MG PO TABS: 5.0000 mg | ORAL_TABLET | Freq: Two times a day (BID) | ORAL | Status: DC

## 2016-05-02 ENCOUNTER — Other Ambulatory Visit: Payer: Self-pay | Admitting: *Deleted

## 2016-05-02 MED ORDER — APIXABAN 5 MG PO TABS: 5.0000 mg | ORAL_TABLET | Freq: Two times a day (BID) | ORAL | Status: DC

## 2016-06-14 ENCOUNTER — Telehealth: Payer: Self-pay | Admitting: Cardiology

## 2016-06-14 NOTE — Telephone Encounter (Signed)
Patient needs samples

## 2016-06-14 NOTE — Telephone Encounter (Signed)
Returned pt call, no answer- left voicemail to call back.

## 2016-06-18 MED ORDER — APIXABAN 5 MG PO TABS: 5.0000 mg | ORAL_TABLET | Freq: Two times a day (BID) | ORAL | 0 refills | Status: DC

## 2016-06-18 NOTE — Telephone Encounter (Signed)
Pt will come by [facility] office and pick up samples

## 2016-06-21 ENCOUNTER — Encounter: Payer: Self-pay | Admitting: Obstetrics & Gynecology

## 2016-06-21 ENCOUNTER — Ambulatory Visit (INDEPENDENT_AMBULATORY_CARE_PROVIDER_SITE_OTHER): Payer: Medicare HMO | Admitting: Obstetrics & Gynecology

## 2016-06-21 VITALS — BP 120/80 | HR 80 | Wt 141.4 lb

## 2016-06-21 DIAGNOSIS — N993 Prolapse of vaginal vault after hysterectomy: Secondary | ICD-10-CM

## 2016-06-21 NOTE — Progress Notes (Signed)
Patient ID: [name], female   DOB: 1928/08/22, 82 y.o.   MRN: [id number] Patient ID: Sara Garcia, female   DOB: 1927/12/31, 82 y.o.   MRN: [id number] Chief Complaint  Patient presents with  . change pessary    Blood pressure 120/80, pulse 80, weight 141 lb 6.4 oz (64.1 kg).  Sara Garcia presents today for routine follow up related to her pessary.   She uses a milex ring #2 She reports no vaginal discharge or vaginal bleeding.  Exam reveals no undue vaginal mucosal pressure of breakdown, no discharge and no vaginal bleeding.  The pessary is removed, cleaned and replaced without difficulty.    Sara Garcia will be sen back in 4 months for continued follow up.  Florian Buff, MD   06/21/2016 12:21 PM

## 2016-06-27 ENCOUNTER — Other Ambulatory Visit (HOSPITAL_COMMUNITY): Payer: Self-pay | Admitting: Internal Medicine

## 2016-06-27 ENCOUNTER — Ambulatory Visit (HOSPITAL_COMMUNITY)
Admission: RE | Admit: 2016-06-27 | Discharge: 2016-06-27 | Disposition: A | Discharge status: Home / Self Care | Payer: Medicare HMO | Source: Ambulatory Visit | Attending: Internal Medicine | Admitting: Internal Medicine

## 2016-06-27 DIAGNOSIS — M25512 Pain in left shoulder: Secondary | ICD-10-CM

## 2016-07-08 ENCOUNTER — Telehealth: Payer: Self-pay | Admitting: Cardiology

## 2016-07-08 NOTE — Telephone Encounter (Signed)
apixaban (ELIQUIS) 5 MG TABS tablet  She and her husband are needing samples

## 2016-07-09 NOTE — Telephone Encounter (Signed)
Pt has appt with Dr. Harl Bowie tomorrow will pick up samples at that time.

## 2016-07-10 ENCOUNTER — Encounter: Payer: Self-pay | Admitting: Cardiology

## 2016-07-10 ENCOUNTER — Encounter: Payer: Self-pay | Admitting: *Deleted

## 2016-07-10 ENCOUNTER — Ambulatory Visit (INDEPENDENT_AMBULATORY_CARE_PROVIDER_SITE_OTHER): Payer: Medicare HMO | Admitting: Cardiology

## 2016-07-10 VITALS — BP 146/90 | HR 102 | Ht 62.0 in | Wt 146.0 lb

## 2016-07-10 DIAGNOSIS — I4891 Unspecified atrial fibrillation: Secondary | ICD-10-CM

## 2016-07-10 DIAGNOSIS — I10 Essential (primary) hypertension: Secondary | ICD-10-CM

## 2016-07-10 MED ORDER — APIXABAN 5 MG PO TABS: 5.0000 mg | ORAL_TABLET | Freq: Two times a day (BID) | ORAL | 0 refills | Status: DC

## 2016-07-10 NOTE — Patient Instructions (Signed)

## 2016-07-10 NOTE — Progress Notes (Signed)
Clinical Summary Sara Garcia is a 82 y.o.female seen today for follow up of the following medical problems.   1. Parox afib - on beta blocker for rate control, eliquis stroke prophylaxis. - previously we had icreased her lopressor to 50mg  bid from 37.5 mg bid. With change she reported worsening fluttering in her chest and fatigue, we cut dose back to 37.5mg  bid.and she felt better   - no recent palpitations - no bleeding troubles on eliquis.   2. Dysphagia - followed by GI  3. HTN - does not check at home - compliant with meds     SH: reports recent stress due to a murder in her neihborhood  Past Medical History:  Diagnosis Date  . A-fib ([place])   . Arthritis   . Asthma   . Dementia   . DVT (deep venous thrombosis) ([place]) 2011/2012   chronic coumadin, h/o blood clots on coumadin.  . Esophageal dysphagia    limited food and pill  . Esophageal reflux   . Essential hypertension, benign   . GERD (gastroesophageal reflux disease)   . Osteoporosis   . Unspecified prolapse of vaginal walls      Allergies  Allergen Reactions  . Latex Itching and Rash  . Adhesive [Tape] Other (See Comments)    Tears skin  . Ciprofloxacin Nausea And Vomiting  . Codeine Nausea And Vomiting    Upsets stomach, nightmares  . Penicillins Hives  . Sulfa Antibiotics Hives     Current Outpatient Prescriptions  Medication Sig Dispense Refill  . acetaminophen (TYLENOL) 500 MG tablet Take 1,000 mg by mouth 2 (two) times daily as needed for mild pain, moderate pain or headache.     Marland Kitchen apixaban (ELIQUIS) 5 MG TABS tablet Take 1 tablet (5 mg total) by mouth 2 (two) times daily. 84 tablet 0  . cyanocobalamin 2000 MCG tablet Take 2,000 mcg by mouth daily.    . ferrous sulfate 325 (65 FE) MG EC tablet Take 1 tablet (325 mg total) by mouth 3 (three) times daily with meals. 90 tablet 3  . Fluticasone-Salmeterol (ADVAIR) 250-50 MCG/DOSE AEPB Inhale 1 puff into the lungs daily.     . metoprolol  tartrate (LOPRESSOR) 25 MG tablet Take 1.5 tablets (37.5 mg total) by mouth 2 (two) times daily. 270 tablet 3  . metoprolol tartrate (LOPRESSOR) 25 MG tablet TAKE ONE AND ONE-HALF TABLETS BY MOUTH TWICE DAILY 90 tablet 6  . pyridOXINE (VITAMIN B-6) 100 MG tablet Take 100 mg by mouth daily.     No current facility-administered medications for this visit.      Past Surgical History:  Procedure Laterality Date  . ABDOMINAL HYSTERECTOMY    . COLONOSCOPY W/ BIOPSIES  02/01/2004   RMR: Anal papilla with normal rectum/Sigmoid diverticula/Small polyps in the cecum removed as described above. Inflammatory polyp  . ESOPHAGEAL DILATION     unknown year per patient.  . ESOPHAGEAL DILATION N/A 02/07/2015   RMR: Normal esophagus status post maloney dilation. Small hiatal hernia.   Marland Kitchen ESOPHAGOGASTRODUODENOSCOPY     unknown year per patient  . ESOPHAGOGASTRODUODENOSCOPY N/A 02/07/2015   RMR: Normal esophagus  status post  Maloney dilation. Small hiatal hernia  . NASAL SEPTUM SURGERY    . TONSILLECTOMY    . WRIST FRACTURE SURGERY       Allergies  Allergen Reactions  . Latex Itching and Rash  . Adhesive [Tape] Other (See Comments)    Tears skin  . Ciprofloxacin Nausea And Vomiting  .  Codeine Nausea And Vomiting    Upsets stomach, nightmares  . Penicillins Hives  . Sulfa Antibiotics Hives      Family History  Problem Relation Age of Onset  . Colon polyps Neg Hx   . Colon cancer Neg Hx      Social History Ms. Raffo reports that she has never smoked. She has never used smokeless tobacco. Ms. Jarrow reports that she does not drink alcohol.   Review of Systems CONSTITUTIONAL: No weight loss, fever, chills, weakness or fatigue.  HEENT: Eyes: No visual loss, blurred vision, double vision or yellow sclerae.No hearing loss, sneezing, congestion, runny nose or sore throat.  SKIN: No rash or itching.  CARDIOVASCULAR: per HPI RESPIRATORY: No shortness of breath, cough or sputum.    GASTROINTESTINAL: No anorexia, nausea, vomiting or diarrhea. No abdominal pain or blood.  GENITOURINARY: No burning on urination, no polyuria NEUROLOGICAL: No headache, dizziness, syncope, paralysis, ataxia, numbness or tingling in the extremities. No change in bowel or bladder control.  MUSCULOSKELETAL: No muscle, back pain, joint pain or stiffness.  LYMPHATICS: No enlarged nodes. No history of splenectomy.  PSYCHIATRIC: No history of depression or anxiety.  ENDOCRINOLOGIC: No reports of sweating, cold or heat intolerance. No polyuria or polydipsia.  Marland Kitchen   Physical Examination Vitals:   07/10/16 1329  BP: (!) 146/90  Pulse: (!) 102   Vitals:   07/10/16 1329  Weight: 146 lb (66.2 kg)  Height: 5\' 2"  (1.575 m)    Gen: resting comfortably, no acute distress HEENT: no scleral icterus, pupils equal round and reactive, no palptable cervical adenopathy,  CV: irreg, no m/r/g, no jvd Resp: Clear to auscultation bilaterally GI: abdomen is soft, non-tender, non-distended, normal bowel sounds, no hepatosplenomegaly MSK: extremities are warm, no edema.  Skin: warm, no rash Neuro:  no focal deficits Psych: appropriate affect   Diagnostic Studies 12/2013 Lexicscan MPI Impression  Exercise Capacity: Lexiscan with no exercise.  BP Response: Normal blood pressure response.  Clinical Symptoms: Nausea  ECG Impression: No significant ST segment change suggestive of ischemia.  Comparison with Prior Nuclear Study: No previous nuclear study performed  Overall Impression: Normal stress nuclear study.  LV Ejection Fraction: 85%. LV Wall Motion: NL LV Function; NL Wall Motion    Jan 2015 echo Study Conclusions  - Study data: Technically difficult study. - Left ventricle: The cavity size was normal. Wall thickness was increased in a pattern of mild LVH. There is mild basal septal hypertrophy without obstructive gradient. Systolic function was normal. The estimated ejection fraction  was in the range of 60% to 65%. Doppler parameters are consistent with abnormal left ventricular relaxation (grade 1 diastolic dysfunction). - Aortic valve: Mildly calcified annulus. Trileaflet; mildly thickened leaflets. Trivial regurgitation. Valve area: 1.69cm^2(VTI). Valve area: 1.79cm^2 (Vmax). - Mitral valve: Mildly calcified annulus. Mildly thickened leaflets .   01/2015 Holter monitor No significant arrhythmias    Assessment and Plan  1. Parox afib -did not tolerate lopressor 50 mg bid, decreased back to 37.5mg  bid and doing well. - no recent symptoms. Mildly elevated rate in clinic however she has just taken her beta blocker - we will continue current meds. CHADS2Vasc score is 4, continue eliquis.  - ekg in clinic shows afib rate low 100s  2. HTN - at goal, continue current meds       Arnoldo Lenis, M.D.

## 2016-07-25 ENCOUNTER — Ambulatory Visit (INDEPENDENT_AMBULATORY_CARE_PROVIDER_SITE_OTHER): Payer: Medicare HMO | Admitting: Obstetrics & Gynecology

## 2016-07-25 ENCOUNTER — Encounter: Payer: Self-pay | Admitting: Obstetrics & Gynecology

## 2016-07-25 VITALS — BP 128/74 | HR 68 | Ht 62.0 in | Wt 146.0 lb

## 2016-07-25 DIAGNOSIS — Z01419 Encounter for gynecological examination (general) (routine) without abnormal findings: Secondary | ICD-10-CM

## 2016-07-25 DIAGNOSIS — N993 Prolapse of vaginal vault after hysterectomy: Secondary | ICD-10-CM

## 2016-07-25 DIAGNOSIS — Z01411 Encounter for gynecological examination (general) (routine) with abnormal findings: Secondary | ICD-10-CM

## 2016-07-25 NOTE — Progress Notes (Signed)
Subjective:     Sara Garcia is a 82 y.o. female here for a routine exam.  No LMP recorded. Patient has had a hysterectomy. No obstetric history on file. Birth Control Method:  hysterectomy Menstrual Calendar(currently): na  Current complaints: some discharge.   Current acute medical issues:  none   Recent Gynecologic History No LMP recorded. Patient has had a hysterectomy. Last Pap: na,   Last mammogram: 2017,  normal  Past Medical History:  Diagnosis Date  . A-fib ([place])   . Arthritis   . Asthma   . Dementia   . DVT (deep venous thrombosis) ([place]) 2011/2012   chronic coumadin, h/o blood clots on coumadin.  . Esophageal dysphagia    limited food and pill  . Esophageal reflux   . Essential hypertension, benign   . GERD (gastroesophageal reflux disease)   . Osteoporosis   . Unspecified prolapse of vaginal walls     Past Surgical History:  Procedure Laterality Date  . ABDOMINAL HYSTERECTOMY    . COLONOSCOPY W/ BIOPSIES  02/01/2004   RMR: Anal papilla with normal rectum/Sigmoid diverticula/Small polyps in the cecum removed as described above. Inflammatory polyp  . ESOPHAGEAL DILATION     unknown year per patient.  . ESOPHAGEAL DILATION N/A 02/07/2015   RMR: Normal esophagus status post maloney dilation. Small hiatal hernia.   Marland Kitchen ESOPHAGOGASTRODUODENOSCOPY     unknown year per patient  . ESOPHAGOGASTRODUODENOSCOPY N/A 02/07/2015   RMR: Normal esophagus  status post  Maloney dilation. Small hiatal hernia  . NASAL SEPTUM SURGERY    . TONSILLECTOMY    . WRIST FRACTURE SURGERY      OB History    No data available      Social History   Social History  . Marital status: Married    Spouse name: N/A  . Number of children: N/A  . Years of education: N/A   Social History Main Topics  . Smoking status: Never Smoker  . Smokeless tobacco: Never Used  . Alcohol use No  . Drug use: No  . Sexual activity: Not Currently    Birth control/ protection: None   Other Topics  Concern  . None   Social History Narrative  . None    Family History  Problem Relation Age of Onset  . Colon polyps Neg Hx   . Colon cancer Neg Hx      Current Outpatient Prescriptions:  .  acetaminophen (TYLENOL) 500 MG tablet, Take 1,000 mg by mouth 2 (two) times daily as needed for mild pain, moderate pain or headache. , Disp: , Rfl:  .  apixaban (ELIQUIS) 5 MG TABS tablet, Take 1 tablet (5 mg total) by mouth 2 (two) times daily., Disp: 70 tablet, Rfl: 0 .  cyanocobalamin 2000 MCG tablet, Take 2,000 mcg by mouth daily., Disp: , Rfl:  .  DULoxetine (CYMBALTA) 60 MG capsule, Take 60 mg by mouth daily., Disp: , Rfl:  .  esomeprazole (NEXIUM) 20 MG capsule, Take 20 mg by mouth daily at 12 noon., Disp: , Rfl:  .  Fluticasone-Salmeterol (ADVAIR) 250-50 MCG/DOSE AEPB, Inhale 1 puff into the lungs daily. , Disp: , Rfl:  .  metoprolol tartrate (LOPRESSOR) 25 MG tablet, Take 1.5 tablets (37.5 mg total) by mouth 2 (two) times daily., Disp: 270 tablet, Rfl: 3 .  pyridOXINE (VITAMIN B-6) 100 MG tablet, Take 100 mg by mouth daily., Disp: , Rfl:   Review of Systems  Review of Systems  Constitutional: Negative for fever, chills,  weight loss, malaise/fatigue and diaphoresis.  HENT: Negative for hearing loss, ear pain, nosebleeds, congestion, sore throat, neck pain, tinnitus and ear discharge.   Eyes: Negative for blurred vision, double vision, photophobia, pain, discharge and redness.  Respiratory: Negative for cough, hemoptysis, sputum production, shortness of breath, wheezing and stridor.   Cardiovascular: Negative for chest pain, palpitations, orthopnea, claudication, leg swelling and PND.  Gastrointestinal: negative for abdominal pain. Negative for heartburn, nausea, vomiting, diarrhea, constipation, blood in stool and melena.  Genitourinary: Negative for dysuria, urgency, frequency, hematuria and flank pain.  Musculoskeletal: Negative for myalgias, back pain, joint pain and falls.  Skin:  Negative for itching and rash.  Neurological: Negative for dizziness, tingling, tremors, sensory change, speech change, focal weakness, seizures, loss of consciousness, weakness and headaches.  Endo/Heme/Allergies: Negative for environmental allergies and polydipsia. Does not bruise/bleed easily.  Psychiatric/Behavioral: Negative for depression, suicidal ideas, hallucinations, memory loss and substance abuse. The patient is not nervous/anxious and does not have insomnia.        Objective:  Blood pressure 128/74, pulse 68, height 5\' 2"  (1.575 m), weight 146 lb (66.2 kg).   Physical Exam  Vitals reviewed. Constitutional: She is oriented to person, place, and time. She appears well-developed and well-nourished.  HENT:  Head: Normocephalic and atraumatic.        Right Ear: External ear normal.  Left Ear: External ear normal.  Nose: Nose normal.  Mouth/Throat: Oropharynx is clear and moist.  Eyes: Conjunctivae and EOM are normal. Pupils are equal, round, and reactive to light. Right eye exhibits no discharge. Left eye exhibits no discharge. No scleral icterus.  Neck: Normal range of motion. Neck supple. No tracheal deviation present. No thyromegaly present.  Cardiovascular: Normal rate, regular rhythm, normal heart sounds and intact distal pulses.  Exam reveals no gallop and no friction rub.   No murmur heard. Respiratory: Effort normal and breath sounds normal. No respiratory distress. She has no wheezes. She has no rales. She exhibits no tenderness.  GI: Soft. Bowel sounds are normal. She exhibits no distension and no mass. There is no tenderness. There is no rebound and no guarding.  Genitourinary:  Breasts no masses skin changes or nipple changes bilaterally      Vulva is normal without lesions Vagina is pink moist without discharge Cervix absent Uterus is absent Adnexa is negative   Musculoskeletal: Normal range of motion. She exhibits no edema and no tenderness.  Neurological: She  is alert and oriented to person, place, and time. She has normal reflexes. She displays normal reflexes. No cranial nerve deficit. She exhibits normal muscle tone. Coordination normal.  Skin: Skin is warm and dry. No rash noted. No erythema. No pallor.  Psychiatric: She has a normal mood and affect. Her behavior is normal. Judgment and thought content normal.       Medications Ordered at today's visit: No orders of the defined types were placed in this encounter.   Other orders placed at today's visit: No orders of the defined types were placed in this encounter.     Assessment:    Healthy female exam.    Plan:      Chief Complaint  Patient presents with  . increased discharge with the pessary    c/o pain in left breast    Blood pressure 128/74, pulse 68, height 5\' 2"  (1.575 m), weight 146 lb (66.2 kg).  Sara Garcia presents today for routine follow up related to her pessary.   She uses a milex  ring with support #3 She reports little vaginal discharge or vaginal bleeding.  Exam reveals no undue vaginal mucosal pressure of breakdown, no discharge and no vaginal bleeding.  The pessary is removed, cleaned and replaced without difficulty.    Sara Garcia will be sen back in 4 months for continued follow up.  Florian Buff, MD  07/25/2016 2:49 PM        No Follow-up on file.

## 2016-07-29 ENCOUNTER — Telehealth: Payer: Self-pay | Admitting: Cardiology

## 2016-07-29 MED ORDER — APIXABAN 5 MG PO TABS: 5.0000 mg | ORAL_TABLET | Freq: Two times a day (BID) | ORAL | 0 refills | Status: DC

## 2016-07-29 NOTE — Telephone Encounter (Signed)
Patient walked into office for samples.  Samples provided.

## 2016-07-29 NOTE — Telephone Encounter (Signed)
Mrs. Allison called requesting Eliquis samples  Please call [phone]

## 2016-08-08 ENCOUNTER — Other Ambulatory Visit: Payer: Self-pay | Admitting: *Deleted

## 2016-08-08 MED ORDER — APIXABAN 5 MG PO TABS
5.0000 mg | ORAL_TABLET | Freq: Two times a day (BID) | ORAL | 0 refills | Status: DC
Start: 2016-08-08 — End: 2016-09-16

## 2016-08-26 ENCOUNTER — Telehealth: Payer: Self-pay | Admitting: Internal Medicine

## 2016-08-26 NOTE — Telephone Encounter (Signed)
Letter mailed

## 2016-08-26 NOTE — Telephone Encounter (Signed)
Recall for 1 yr ct chest

## 2016-09-02 ENCOUNTER — Other Ambulatory Visit: Payer: Self-pay

## 2016-09-02 DIAGNOSIS — R911 Solitary pulmonary nodule: Secondary | ICD-10-CM

## 2016-09-03 NOTE — Patient Instructions (Signed)
PA# for CT chest w/contrast: [id number]

## 2016-09-09 ENCOUNTER — Telehealth: Payer: Self-pay | Admitting: Obstetrics & Gynecology

## 2016-09-09 NOTE — Telephone Encounter (Signed)
Pt c/o pain in vagina x 3 days, pt states she has a pessary. Pt given an appt to be evaluated with Dr.Eure.

## 2016-09-10 ENCOUNTER — Encounter: Payer: Self-pay | Admitting: Obstetrics & Gynecology

## 2016-09-10 ENCOUNTER — Ambulatory Visit (INDEPENDENT_AMBULATORY_CARE_PROVIDER_SITE_OTHER): Payer: Medicare HMO | Admitting: *Deleted

## 2016-09-10 ENCOUNTER — Ambulatory Visit (INDEPENDENT_AMBULATORY_CARE_PROVIDER_SITE_OTHER): Payer: Medicare HMO | Admitting: Obstetrics & Gynecology

## 2016-09-10 VITALS — BP 150/100 | HR 82 | Wt 147.0 lb

## 2016-09-10 DIAGNOSIS — Z4689 Encounter for fitting and adjustment of other specified devices: Secondary | ICD-10-CM

## 2016-09-10 DIAGNOSIS — N993 Prolapse of vaginal vault after hysterectomy: Secondary | ICD-10-CM

## 2016-09-10 DIAGNOSIS — I4891 Unspecified atrial fibrillation: Secondary | ICD-10-CM

## 2016-09-10 NOTE — Progress Notes (Signed)
Pt was started on Eliquis 5mg  bid for atrial fib on 02/03/15 by Dr Harl Bowie. Coumadin was discontinued.  Pt states she has been tolerating Eliquis well. No s/s of increased bruising, bleeding or GI upset.  Reviewed patients medication list. Pt is not currently on any combined P-gp and strong CYP3A4 inhibitors/inducers (ketoconazole, traconazole, ritonavir, carbamazepine, phenytoin, rifampin, St. John's wort). Reviewed labs from . SCr 0.75, Weight 66.7, CrCl 56.70. Dose is appropriate based on 2 out of 3 criteria (age, weight, SrCr). Hgb and HCT: 13.0/39.7  A full discussion of the nature of anticoagulants has been carried out. A benefit/risk analysis has been presented to the patient, so that they understand the justification for choosing anticoagulation with Eliquis at this time. The need for compliance is stressed. Pt is aware to take the medication twice daily. Side effects of potential bleeding are discussed, including unusual colored urine or stools, coughing up blood or coffee ground emesis, nose bleeds or serious fall or head trauma. Discussed signs and symptoms of stroke. The patient should avoid any OTC items containing aspirin or ibuprofen. Avoid alcohol consumption. Call if any signs of abnormal bleeding. Discussed financial obligations and resolved any difficulty in obtaining medication. Next lab test test in 6 months.  LMOM with lab results.  Placed in recall for 6 month follow up.

## 2016-09-10 NOTE — Progress Notes (Signed)
Chief Complaint  Patient presents with  . pain vaginal area x 3 day    has pessary    Blood pressure (!) 150/100, pulse 82, weight 147 lb (66.7 kg).  82 y.o. No obstetric history on file. No LMP recorded. Patient has had a hysterectomy. The current method of family planning is post menopausal status.  Outpatient Encounter Prescriptions as of 09/10/2016  Medication Sig Note  . acetaminophen (TYLENOL) 500 MG tablet Take 1,000 mg by mouth 2 (two) times daily as needed for mild pain, moderate pain or headache.    Marland Kitchen apixaban (ELIQUIS) 5 MG TABS tablet Take 1 tablet (5 mg total) by mouth 2 (two) times daily.   . cyanocobalamin 2000 MCG tablet Take 2,000 mcg by mouth daily.   . DULoxetine (CYMBALTA) 60 MG capsule Take 60 mg by mouth daily.   Marland Kitchen esomeprazole (NEXIUM) 20 MG capsule Take 20 mg by mouth daily at 12 noon.   . Fluticasone-Salmeterol (ADVAIR) 250-50 MCG/DOSE AEPB Inhale 1 puff into the lungs daily.  07/19/2014: Prescribed for 1 puff BID, but pt reports only using 1 puff every AM  . metoprolol tartrate (LOPRESSOR) 25 MG tablet Take 1.5 tablets (37.5 mg total) by mouth 2 (two) times daily.   Marland Kitchen pyridOXINE (VITAMIN B-6) 100 MG tablet Take 100 mg by mouth daily.    No facility-administered encounter medications on file as of 09/10/2016.     Subjective Pt has had a pessary in for many many years a milex ring with support #2 She had a small amount of bleeding the last time she came in For the last 4 days she has had osme vaginal pain No bleeding no discharge no odor  Objective Pessary removed no vaginal pressure changes noted No lesions   Pertinent ROS   Labs or studies     Impression Diagnoses this Encounter::   ICD-9-CM ICD-10-CM   1. Pessary maintenance V53.99 Z46.89     Established relevant diagnosis(es):   Plan/Recommendations: No orders of the defined types were placed in this encounter.   Labs or Scans Ordered: No orders of the defined types were  placed in this encounter.   Management:: Removed the pessary and will leave out for now  Follow up Return in about 6 weeks (around 10/22/2016) for Follow up, with Dr Elonda Husky.     All questions were answered.  Past Medical History:  Diagnosis Date  . A-fib ([place])   . Arthritis   . Asthma   . Dementia   . DVT (deep venous thrombosis) ([place]) 2011/2012   chronic coumadin, h/o blood clots on coumadin.  . Esophageal dysphagia    limited food and pill  . Esophageal reflux   . Essential hypertension, benign   . GERD (gastroesophageal reflux disease)   . Osteoporosis   . Unspecified prolapse of vaginal walls     Past Surgical History:  Procedure Laterality Date  . ABDOMINAL HYSTERECTOMY    . COLONOSCOPY W/ BIOPSIES  02/01/2004   RMR: Anal papilla with normal rectum/Sigmoid diverticula/Small polyps in the cecum removed as described above. Inflammatory polyp  . ESOPHAGEAL DILATION     unknown year per patient.  . ESOPHAGEAL DILATION N/A 02/07/2015   RMR: Normal esophagus status post maloney dilation. Small hiatal hernia.   Marland Kitchen ESOPHAGOGASTRODUODENOSCOPY     unknown year per patient  . ESOPHAGOGASTRODUODENOSCOPY N/A 02/07/2015   RMR: Normal esophagus  status post  Maloney dilation. Small hiatal hernia  . NASAL SEPTUM SURGERY    .  TONSILLECTOMY    . WRIST FRACTURE SURGERY      OB History    No data available      Allergies  Allergen Reactions  . Latex Itching and Rash  . Adhesive [Tape] Other (See Comments)    Tears skin  . Ciprofloxacin Nausea And Vomiting  . Codeine Nausea And Vomiting    Upsets stomach, nightmares  . Penicillins Hives  . Sulfa Antibiotics Hives    Social History   Social History  . Marital status: Married    Spouse name: N/A  . Number of children: N/A  . Years of education: N/A   Social History Main Topics  . Smoking status: Never Smoker  . Smokeless tobacco: Never Used  . Alcohol use No  . Drug use: No  . Sexual activity: Not Currently     Birth control/ protection: None   Other Topics Concern  . None   Social History Narrative  . None    Family History  Problem Relation Age of Onset  . Colon polyps Neg Hx   . Colon cancer Neg Hx

## 2016-09-11 ENCOUNTER — Other Ambulatory Visit: Payer: Self-pay | Admitting: Cardiology

## 2016-09-11 ENCOUNTER — Ambulatory Visit (HOSPITAL_COMMUNITY)
Admission: RE | Admit: 2016-09-11 | Discharge: 2016-09-11 | Disposition: A | Discharge status: Home / Self Care | Payer: Medicare HMO | Source: Ambulatory Visit | Attending: Internal Medicine | Admitting: Internal Medicine

## 2016-09-11 DIAGNOSIS — J439 Emphysema, unspecified: Secondary | ICD-10-CM | POA: Insufficient documentation

## 2016-09-11 DIAGNOSIS — R911 Solitary pulmonary nodule: Secondary | ICD-10-CM | POA: Insufficient documentation

## 2016-09-11 DIAGNOSIS — R590 Localized enlarged lymph nodes: Secondary | ICD-10-CM | POA: Diagnosis not present

## 2016-09-11 LAB — BASIC METABOLIC PANEL
BUN: 21 mg/dL (ref 7–25)
CO2: 28 mmol/L (ref 20–31)
Calcium: 8.8 mg/dL (ref 8.6–10.4)
Chloride: 101 mmol/L (ref 98–110)
Creat: 0.75 mg/dL (ref 0.60–0.88)
Glucose, Bld: 77 mg/dL (ref 65–99)
Potassium: 4.1 mmol/L (ref 3.5–5.3)
Sodium: 138 mmol/L (ref 135–146)

## 2016-09-11 LAB — POCT I-STAT CREATININE: Creatinine, Ser: 0.7 mg/dL (ref 0.44–1.00)

## 2016-09-11 MED ORDER — IOPAMIDOL (ISOVUE-300) INJECTION 61%
75.0000 mL | Freq: Once | INTRAVENOUS | Status: AC | PRN
  Administered 2016-09-11: 75 mL via INTRAVENOUS

## 2016-09-12 LAB — CBC
HCT: 39.7 % (ref 35.0–45.0)
Hemoglobin: 13 g/dL (ref 11.7–15.5)
MCH: 30 pg (ref 27.0–33.0)
MCHC: 32.7 g/dL (ref 32.0–36.0)
MCV: 91.5 fL (ref 80.0–100.0)
MPV: 9.7 fL (ref 7.5–12.5)
Platelets: 283 10*3/uL (ref 140–400)
RBC: 4.34 MIL/uL (ref 3.80–5.10)
RDW: 13.9 % (ref 11.0–15.0)
WBC: 6.9 10*3/uL (ref 3.8–10.8)

## 2016-09-16 ENCOUNTER — Other Ambulatory Visit: Payer: Self-pay | Admitting: *Deleted

## 2016-09-16 MED ORDER — APIXABAN 5 MG PO TABS: 5.0000 mg | ORAL_TABLET | Freq: Two times a day (BID) | ORAL | 0 refills | Status: DC

## 2016-09-18 ENCOUNTER — Telehealth: Payer: Self-pay | Admitting: *Deleted

## 2016-09-18 NOTE — Telephone Encounter (Signed)
Pt aware - routed to pcp  

## 2016-09-18 NOTE — Telephone Encounter (Signed)
-----   Message from Arnoldo Lenis, MD sent at 09/16/2016 12:41 PM EST ----- Labs look good  Zandra Abts MD

## 2016-09-20 ENCOUNTER — Ambulatory Visit (INDEPENDENT_AMBULATORY_CARE_PROVIDER_SITE_OTHER): Payer: Medicare HMO | Admitting: Obstetrics & Gynecology

## 2016-09-20 ENCOUNTER — Encounter: Payer: Self-pay | Admitting: Obstetrics & Gynecology

## 2016-09-20 VITALS — BP 160/100 | HR 72 | Wt 145.4 lb

## 2016-09-20 DIAGNOSIS — Z4689 Encounter for fitting and adjustment of other specified devices: Secondary | ICD-10-CM | POA: Diagnosis not present

## 2016-09-20 DIAGNOSIS — N993 Prolapse of vaginal vault after hysterectomy: Secondary | ICD-10-CM

## 2016-09-20 NOTE — Progress Notes (Signed)
Patient ID: Sara Garcia, female   DOB: [birth year], 82 y.o.   MRN: [id number]   Pt decided she wanted the pessary replaced due to pressure and back pain   Patient ID: Sara Garcia, female   DOB: 1928/07/16, 82 y.o.   MRN: [id number] Chief Complaint  Patient presents with  . Follow-up    reinsert pessary    Blood pressure (!) 160/100, pulse 72, weight 145 lb 6.4 oz (66 kg).  Sara Garcia presents today for routine follow up related to her pessary.   She uses a milex ring #2 She reports no vaginal discharge or vaginal bleeding.  Exam reveals no undue vaginal mucosal pressure of breakdown, no discharge and no vaginal bleeding.  The pessary is removed, cleaned and replaced without difficulty.    Sara Garcia will be sen back in 4 months for continued follow up.  Sara Buff, MD   09/20/2016 12:08 PM

## 2016-10-22 ENCOUNTER — Ambulatory Visit: Payer: Medicare HMO | Admitting: Obstetrics & Gynecology

## 2016-11-01 ENCOUNTER — Telehealth: Payer: Self-pay | Admitting: Cardiology

## 2016-11-01 NOTE — Telephone Encounter (Signed)
Sara Garcia called stating that she needs to know about papers for her Eliquis. Please call [phone].

## 2016-11-02 ENCOUNTER — Other Ambulatory Visit: Payer: Self-pay | Admitting: Cardiology

## 2016-11-04 ENCOUNTER — Other Ambulatory Visit: Payer: Self-pay

## 2016-11-04 ENCOUNTER — Other Ambulatory Visit: Payer: Self-pay | Admitting: *Deleted

## 2016-11-04 MED ORDER — APIXABAN 5 MG PO TABS
5.0000 mg | ORAL_TABLET | Freq: Two times a day (BID) | ORAL | 3 refills | Status: DC
Start: 2016-11-04 — End: 2016-11-05

## 2016-11-04 MED ORDER — METOPROLOL TARTRATE 25 MG PO TABS: 37.5000 mg | ORAL_TABLET | Freq: Two times a day (BID) | ORAL | 3 refills | Status: DC

## 2016-11-04 NOTE — Telephone Encounter (Signed)
Attempted to return call.  Busy.   

## 2016-11-04 NOTE — Telephone Encounter (Signed)
Sara Garcia called wanting to know if paper work has been completed for her medication.

## 2016-11-05 ENCOUNTER — Other Ambulatory Visit: Payer: Self-pay | Admitting: *Deleted

## 2016-11-05 MED ORDER — METOPROLOL TARTRATE 25 MG PO TABS: 37.5000 mg | ORAL_TABLET | Freq: Two times a day (BID) | ORAL | 3 refills | Status: DC

## 2016-11-05 MED ORDER — APIXABAN 5 MG PO TABS: 5.0000 mg | ORAL_TABLET | Freq: Two times a day (BID) | ORAL | 3 refills | Status: DC

## 2016-11-05 MED ORDER — APIXABAN 5 MG PO TABS: 5.0000 mg | ORAL_TABLET | Freq: Two times a day (BID) | ORAL | 0 refills | Status: DC

## 2016-11-05 NOTE — Telephone Encounter (Signed)
Pt will come by and bring print out of what has been spent on medications - also requested samples of Eliquis and refills be sent to [facility]

## 2016-11-06 MED ORDER — POLYETHYLENE GLYCOL 3350 17 GM/SCOOP PO POWD: 17.0000 g | Freq: Every day | ORAL | 1 refills | Status: DC

## 2016-11-06 MED ORDER — ESOMEPRAZOLE MAGNESIUM 20 MG PO CPDR: 20.0000 mg | DELAYED_RELEASE_CAPSULE | Freq: Every day | ORAL | 5 refills | Status: DC

## 2016-11-07 ENCOUNTER — Other Ambulatory Visit: Payer: Self-pay | Admitting: *Deleted

## 2016-11-07 MED ORDER — APIXABAN 5 MG PO TABS: 5.0000 mg | ORAL_TABLET | Freq: Two times a day (BID) | ORAL | 3 refills | Status: DC

## 2016-11-25 ENCOUNTER — Telehealth: Payer: Self-pay | Admitting: Cardiology

## 2016-11-25 MED ORDER — APIXABAN 5 MG PO TABS: 5.0000 mg | ORAL_TABLET | Freq: Two times a day (BID) | ORAL | 0 refills | Status: DC

## 2016-11-25 NOTE — Telephone Encounter (Signed)
Calling to ask about eliquis samples

## 2016-11-25 NOTE — Telephone Encounter (Signed)
Patient notified that she mat pick up 2 weeks worth of Eliquis samples.

## 2016-12-03 ENCOUNTER — Telehealth: Payer: Self-pay | Admitting: *Deleted

## 2016-12-03 NOTE — Telephone Encounter (Signed)
Pt approved from [employer] pt assistance for Eliquis through [id number]- Application case # [id number]

## 2016-12-11 ENCOUNTER — Other Ambulatory Visit: Payer: Self-pay | Admitting: Nurse Practitioner

## 2016-12-20 ENCOUNTER — Telehealth: Payer: Self-pay | Admitting: Cardiovascular Disease

## 2016-12-20 MED ORDER — APIXABAN 5 MG PO TABS: 5.0000 mg | ORAL_TABLET | Freq: Two times a day (BID) | ORAL | 0 refills | Status: DC

## 2016-12-20 NOTE — Telephone Encounter (Signed)
Asking for samples of eliquis

## 2016-12-20 NOTE — Telephone Encounter (Signed)
Pt hasn't received voucher from [employer] for approval on Eliquis - will give 2 weeks samples - pt aware and will pick up this afternoon

## 2016-12-21 ENCOUNTER — Encounter (HOSPITAL_COMMUNITY): Payer: Self-pay | Admitting: Emergency Medicine

## 2016-12-21 ENCOUNTER — Emergency Department (HOSPITAL_COMMUNITY)
Admission: EM | Admit: 2016-12-21 | Discharge: 2016-12-21 | Disposition: A | Discharge status: Home / Self Care | Payer: Medicare HMO | Attending: Emergency Medicine | Admitting: Emergency Medicine

## 2016-12-21 ENCOUNTER — Emergency Department (HOSPITAL_COMMUNITY): Payer: Medicare HMO

## 2016-12-21 DIAGNOSIS — Y929 Unspecified place or not applicable: Secondary | ICD-10-CM | POA: Insufficient documentation

## 2016-12-21 DIAGNOSIS — Y999 Unspecified external cause status: Secondary | ICD-10-CM | POA: Insufficient documentation

## 2016-12-21 DIAGNOSIS — I10 Essential (primary) hypertension: Secondary | ICD-10-CM | POA: Diagnosis not present

## 2016-12-21 DIAGNOSIS — W06XXXA Fall from bed, initial encounter: Secondary | ICD-10-CM | POA: Insufficient documentation

## 2016-12-21 DIAGNOSIS — S2232XA Fracture of one rib, left side, initial encounter for closed fracture: Secondary | ICD-10-CM | POA: Diagnosis not present

## 2016-12-21 DIAGNOSIS — Y939 Activity, unspecified: Secondary | ICD-10-CM | POA: Insufficient documentation

## 2016-12-21 DIAGNOSIS — W19XXXA Unspecified fall, initial encounter: Secondary | ICD-10-CM

## 2016-12-21 DIAGNOSIS — Z79899 Other long term (current) drug therapy: Secondary | ICD-10-CM | POA: Diagnosis not present

## 2016-12-21 DIAGNOSIS — J45909 Unspecified asthma, uncomplicated: Secondary | ICD-10-CM | POA: Insufficient documentation

## 2016-12-21 DIAGNOSIS — S299XXA Unspecified injury of thorax, initial encounter: Secondary | ICD-10-CM | POA: Diagnosis present

## 2016-12-21 MED ORDER — KETOROLAC TROMETHAMINE 60 MG/2ML IM SOLN
60.0000 mg | Freq: Once | INTRAMUSCULAR | Status: AC
  Administered 2016-12-21: 60 mg via INTRAMUSCULAR
  Filled 2016-12-21: qty 2

## 2016-12-21 MED ORDER — MORPHINE SULFATE (PF) 4 MG/ML IV SOLN
6.0000 mg | Freq: Once | INTRAVENOUS | Status: AC
  Administered 2016-12-21: 6 mg via INTRAMUSCULAR
  Filled 2016-12-21: qty 2

## 2016-12-21 MED ORDER — HYDROCODONE-ACETAMINOPHEN 5-325 MG PO TABS: 1.0000 | ORAL_TABLET | ORAL | 0 refills | Status: DC | PRN

## 2016-12-21 NOTE — ED Notes (Signed)
Pt state she slid from bed to floor this am. C/o left side rib pain worse with palpation and movement. Reports hitting forehead on cane but denies loc. Pt is on eliquis.

## 2016-12-21 NOTE — ED Triage Notes (Signed)
Pt reports slid out of bed this am. Pt reports hit head but denies loc. Pt alert and oriented. Pt reports left sided rib pain since fall. nad noted. Chest expansion symmetrical. Pt reports tenderness on left side of chest. Pt denies being on blood thinners.

## 2016-12-21 NOTE — ED Provider Notes (Signed)
[facility] DEPT Provider Note   CSN: [id number] Arrival date & time: 12/21/16  1226  By signing my name below, I, Higinio Plan, attest that this documentation has been prepared under the direction and in the presence of Jola Schmidt, MD . Electronically Signed: Higinio Plan, Scribe. 12/21/2016. 12:56 PM.  History   Chief Complaint Chief Complaint  Patient presents with  . Fall   The history is provided by the patient. No language interpreter was used.   HPI Comments: COBIE LEIDNER is a 83 y.o. female with PMHx of dementia, A-Fib, DVT, and osteoporosis, brought in by EMS to the Emergency Department complaining of gradually worsening, left-sided chest wall and rib pain s/p a fall that occurred this morning. Pt reports she "rolled off of the bed" this morning, causing the left side of her body to land on her cane located on the ground. She notes she struck her head during the fall but denies any loss of consciousness. She states associated neck pain that is exacerbated with movement of her head upwards. Pt denies any headache. She states she lives at home with her husband who can care for her.   Past Medical History:  Diagnosis Date  . A-fib ([place])   . Arthritis   . Asthma   . Dementia   . DVT (deep venous thrombosis) ([place]) 2011/2012   chronic coumadin, h/o blood clots on coumadin.  . Esophageal dysphagia    limited food and pill  . Esophageal reflux   . Essential hypertension, benign   . GERD (gastroesophageal reflux disease)   . Osteoporosis   . Unspecified prolapse of vaginal walls     Patient Active Problem List   Diagnosis Date Noted  . Diarrhea 01/03/2016  . IDA (iron deficiency anemia) 11/09/2015  . Dysphagia, pharyngoesophageal phase   . Hiatal hernia   . Constipation 01/16/2015  . Esophageal dysphagia 06/27/2014  . GERD (gastroesophageal reflux disease) 06/27/2014  . Personal history of colonic polyps 06/27/2014  . Chronic anticoagulation 06/27/2014  . Primary  osteoarthritis of right knee 05/09/2014  . DISH (diffuse idiopathic skeletal hyperostosis) 05/09/2014  . DDD (degenerative disc disease), lumbosacral 05/09/2014  . Prolapse of vaginal vault after hysterectomy 04/07/2014  . Atrial fibrillation ([place]) 01/10/2014  . Radiculopathy 01/10/2014  . Chest pain 12/21/2013  . Essential hypertension 12/21/2013  . Syncope 10/20/2013    Past Surgical History:  Procedure Laterality Date  . ABDOMINAL HYSTERECTOMY    . COLONOSCOPY W/ BIOPSIES  02/01/2004   RMR: Anal papilla with normal rectum/Sigmoid diverticula/Small polyps in the cecum removed as described above. Inflammatory polyp  . ESOPHAGEAL DILATION     unknown year per patient.  . ESOPHAGEAL DILATION N/A 02/07/2015   RMR: Normal esophagus status post maloney dilation. Small hiatal hernia.   Marland Kitchen ESOPHAGOGASTRODUODENOSCOPY     unknown year per patient  . ESOPHAGOGASTRODUODENOSCOPY N/A 02/07/2015   RMR: Normal esophagus  status post  Maloney dilation. Small hiatal hernia  . NASAL SEPTUM SURGERY    . TONSILLECTOMY    . WRIST FRACTURE SURGERY      OB History    No data available     Home Medications    Prior to Admission medications   Medication Sig Start Date End Date Taking? Authorizing Provider  acetaminophen (TYLENOL) 500 MG tablet Take 1,000 mg by mouth 2 (two) times daily as needed for mild pain, moderate pain or headache.     Historical Provider, MD  apixaban (ELIQUIS) 5 MG TABS tablet Take 1 tablet (  5 mg total) by mouth 2 (two) times daily. 12/20/16   Arnoldo Lenis, MD  cyanocobalamin 2000 MCG tablet Take 2,000 mcg by mouth daily.    Historical Provider, MD  DULoxetine (CYMBALTA) 60 MG capsule Take 60 mg by mouth daily.    Historical Provider, MD  esomeprazole (NEXIUM) 20 MG capsule Take 1 capsule (20 mg total) by mouth daily at 12 noon. 11/06/16   Carlis Stable, NP  Fluticasone-Salmeterol (ADVAIR) 250-50 MCG/DOSE AEPB Inhale 1 puff into the lungs daily.     Historical Provider, MD    metoprolol tartrate (LOPRESSOR) 25 MG tablet TAKE ONE & ONE-HALF TABLETS BY MOUTH TWICE DAILY 11/04/16   Arnoldo Lenis, MD  metoprolol tartrate (LOPRESSOR) 25 MG tablet Take 1.5 tablets (37.5 mg total) by mouth 2 (two) times daily. 11/05/16   Arnoldo Lenis, MD  polyethylene glycol powder (GLYCOLAX/MIRALAX) powder MIX 17 GRAMS WITH LIQUID AND TAKE BY MOUTH ONCE DAILY 12/13/16   Mahala Menghini, PA-C  pyridOXINE (VITAMIN B-6) 100 MG tablet Take 100 mg by mouth daily.    Historical Provider, MD    Family History Family History  Problem Relation Age of Onset  . Colon polyps Neg Hx   . Colon cancer Neg Hx     Social History Social History  Substance Use Topics  . Smoking status: Never Smoker  . Smokeless tobacco: Never Used  . Alcohol use No   Allergies   Latex; Adhesive [tape]; Ciprofloxacin; Codeine; Penicillins; and Sulfa antibiotics  Review of Systems Review of Systems  Cardiovascular: Positive for chest pain (chest wall pain ).  Musculoskeletal: Positive for neck pain.  Neurological: Negative for headaches.  All other systems reviewed and are negative.  Physical Exam Updated Vital Signs BP 180/77 (BP Location: Right Arm)   Pulse 66   Temp 98.3 F (36.8 C) (Oral)   Resp 18   Ht 5\' 2"  (1.575 m)   Wt 138 lb (62.6 kg)   SpO2 96%   BMI 25.24 kg/m   Physical Exam  Constitutional: She is oriented to person, place, and time. She appears well-developed and well-nourished. No distress.  HENT:  Head: Normocephalic and atraumatic.  Eyes: EOM are normal.  Neck: Normal range of motion.  No C-spine tenderness.   Cardiovascular: Normal rate, regular rhythm and normal heart sounds.   Pulmonary/Chest: Effort normal and breath sounds normal. She exhibits tenderness.  Left lateral chest wall tenderness.   Abdominal: Soft. She exhibits no distension. There is no tenderness.  Musculoskeletal: Normal range of motion. She exhibits no tenderness.  Full ROM of bilateral hips, knees,  ankles. Full ROM of bilateral shoulders, elbows, and wrists.   Neurological: She is alert and oriented to person, place, and time.  Skin: Skin is warm and dry.  Psychiatric: She has a normal mood and affect. Judgment normal.  Nursing note and vitals reviewed.  ED Treatments / Results  DIAGNOSTIC STUDIES:  Oxygen Saturation is 96% on RA, normal by my interpretation.    COORDINATION OF CARE:  12:53 PM Discussed treatment plan with pt at bedside and pt agreed to plan.  Labs (all labs ordered are listed, but only abnormal results are displayed) Labs Reviewed - No data to display  EKG  EKG Interpretation None       Radiology Dg Ribs Unilateral W/chest Left  Result Date: 12/21/2016 CLINICAL DATA:  Fall this morning.  Left mid rib pain. EXAM: LEFT RIBS AND CHEST - 3+ VIEW COMPARISON:  03/19/2013 chest radiograph.  FINDINGS: Stable cardiomediastinal silhouette with top-normal heart size. No pneumothorax. No pleural effusion. No pulmonary edema. Mild bibasilar scarring versus atelectasis. Acute mildly displaced lateral left fifth rib fracture. No additional fracture. No suspicious focal osseous lesions. IMPRESSION: Acute mildly displaced lateral left fifth rib fracture. No pneumothorax. No pleural effusions. Mild bibasilar scarring versus atelectasis. Electronically Signed   By: Ilona Sorrel M.D.   On: 12/21/2016 13:41    Procedures Procedures (including critical care time)  Medications Ordered in ED Medications  morphine 4 MG/ML injection 6 mg (6 mg Intramuscular Given 12/21/16 1300)  ketorolac (TORADOL) injection 60 mg (60 mg Intramuscular Given 12/21/16 1300)    Initial Impression / Assessment and Plan / ED Course  I have reviewed the triage vital signs and the nursing notes.  Pertinent labs & imaging results that were available during my care of the patient were reviewed by me and considered in my medical decision making (see chart for details).     2:02 PM Patient's pain is  improved.  Patient with isolated single left rib fracture.  Home with hydrocodone.  Incentive spirometry.  Instructions to patient and family regarding need to breathe and risk of pneumonia.  Close primary care follow-up.  She had a family understand to return to the ER for new or worsening symptoms   I personally performed the services described in this documentation, which was scribed in my presence. The recorded information has been reviewed and is accurate.      Final Clinical Impressions(s) / ED Diagnoses   Final diagnoses:  Closed fracture of one rib of left side, initial encounter  Fall, initial encounter    New Prescriptions Discharge Medication List as of 12/21/2016  2:02 PM    START taking these medications   Details  HYDROcodone-acetaminophen (NORCO/VICODIN) 5-325 MG tablet Take 1 tablet by mouth every 4 (four) hours as needed for moderate pain., Starting Sat 12/21/2016, Print         Jola Schmidt, MD 12/21/16 2226

## 2016-12-26 ENCOUNTER — Telehealth: Payer: Self-pay | Admitting: Cardiology

## 2016-12-26 NOTE — Telephone Encounter (Signed)
Wanting to know status of her Eliquis

## 2016-12-26 NOTE — Telephone Encounter (Signed)
Advised patient she had been approved for voucher to start receiving eliquis. Told patient she could get samples until she receives voucher in the mail.

## 2016-12-27 ENCOUNTER — Other Ambulatory Visit: Payer: Self-pay

## 2016-12-27 MED ORDER — APIXABAN 5 MG PO TABS: 5.0000 mg | ORAL_TABLET | Freq: Two times a day (BID) | ORAL | 0 refills | Status: DC

## 2017-01-03 ENCOUNTER — Other Ambulatory Visit: Payer: Self-pay | Admitting: *Deleted

## 2017-01-03 MED ORDER — APIXABAN 5 MG PO TABS: 5.0000 mg | ORAL_TABLET | Freq: Two times a day (BID) | ORAL | 0 refills | Status: DC

## 2017-01-07 ENCOUNTER — Telehealth: Payer: Self-pay | Admitting: Cardiology

## 2017-01-07 NOTE — Telephone Encounter (Signed)
Pt given [employer] telephone # with approval number from letter we received to inquire on why she hasn't gotten Eliquis yet

## 2017-01-07 NOTE — Telephone Encounter (Signed)
Wants to know why her application for assistance with medication has not gone through yet while her husbands has?

## 2017-01-08 ENCOUNTER — Encounter: Payer: Self-pay | Admitting: *Deleted

## 2017-01-10 ENCOUNTER — Telehealth: Payer: Self-pay | Admitting: *Deleted

## 2017-01-10 NOTE — Telephone Encounter (Signed)
Son Audry Pili) came by office requesting to pick up samples of Eliquis for his mom.  Stated that his Dad got approved & received his medication, she has not received yet.  Stated that Jaquasha had been using her husband's medication.  Son said that Jachelle did call [employer] & was told that a shipment had been left on her front porch.  Lane said that she had not received anything & that they were going to put a tracker on shipment to see where it went.  In the meantime, a new application would need to be sent in.  Please call son Audry Pili) to discuss this further.  Aware Dr.Branch nurse Hardie Pulley) not back in office till Tuesday, 01/13/2017.

## 2017-01-14 ENCOUNTER — Encounter: Payer: Self-pay | Admitting: *Deleted

## 2017-01-14 ENCOUNTER — Encounter: Payer: Self-pay | Admitting: Internal Medicine

## 2017-01-14 ENCOUNTER — Encounter: Payer: Self-pay | Admitting: Cardiology

## 2017-01-14 ENCOUNTER — Ambulatory Visit (INDEPENDENT_AMBULATORY_CARE_PROVIDER_SITE_OTHER): Payer: Medicare HMO | Admitting: Cardiology

## 2017-01-14 VITALS — BP 146/92 | HR 109 | Ht 62.0 in | Wt 146.4 lb

## 2017-01-14 DIAGNOSIS — I4891 Unspecified atrial fibrillation: Secondary | ICD-10-CM | POA: Diagnosis not present

## 2017-01-14 DIAGNOSIS — R197 Diarrhea, unspecified: Secondary | ICD-10-CM | POA: Diagnosis not present

## 2017-01-14 DIAGNOSIS — I10 Essential (primary) hypertension: Secondary | ICD-10-CM

## 2017-01-14 MED ORDER — DILTIAZEM HCL 30 MG PO TABS: 30.0000 mg | ORAL_TABLET | Freq: Two times a day (BID) | ORAL | 1 refills | Status: DC

## 2017-01-14 MED ORDER — APIXABAN 5 MG PO TABS: 5.0000 mg | ORAL_TABLET | Freq: Two times a day (BID) | ORAL | 0 refills | Status: DC

## 2017-01-14 NOTE — Patient Instructions (Signed)
Your physician wants you to follow-up in: [place] will receive a reminder letter in the mail two months in advance. If you don't receive a letter, please call our office to schedule the follow-up appointment.  Your physician has recommended you make the following change in your medication:   START DILTIAZEM 30 MG TWICE DAILY  You have been referred to GASTROENTEROLOGY   Thank you for choosing [facility]!!

## 2017-01-14 NOTE — Telephone Encounter (Signed)
Spoke with [employer] on 3/28 - requested we send letter stating that pt never received medication - letter sent on 3/28

## 2017-01-14 NOTE — Progress Notes (Signed)
Clinical Summary Sara Garcia is a 83 y.o.female seen today for follow up of the following medical problems.   1. Parox afib - on beta blocker for rate control, eliquis stroke prophylaxis. - previously we had icreased her lopressor to 50mg  bid from 37.5 mg bid. With change she reported worsening fluttering in her chest and fatigue, we cut dose back to 37.5mg  bid.and she felt better    - has had some palpitations recently. Can have some chest pressure or sharp pain with it as well.   2. Dysphagia - followed by GI  3. HTN - does not check at home - she remains compliant with meds   4. Fall - recent fall 12/2016, rolled off the bed. Suffered rib fracture.  Past Medical History:  Diagnosis Date  . A-fib ([place])   . Arthritis   . Asthma   . Dementia   . DVT (deep venous thrombosis) ([place]) 2011/2012   chronic coumadin, h/o blood clots on coumadin.  . Esophageal dysphagia    limited food and pill  . Esophageal reflux   . Essential hypertension, benign   . GERD (gastroesophageal reflux disease)   . Osteoporosis   . Unspecified prolapse of vaginal walls      Allergies  Allergen Reactions  . Latex Itching and Rash  . Adhesive [Tape] Other (See Comments)    Tears skin  . Ciprofloxacin Nausea And Vomiting  . Codeine Nausea And Vomiting    Upsets stomach, nightmares  . Penicillins Hives  . Sulfa Antibiotics Hives     Current Outpatient Prescriptions  Medication Sig Dispense Refill  . acetaminophen (TYLENOL) 500 MG tablet Take 1,000 mg by mouth 2 (two) times daily as needed for mild pain, moderate pain or headache.     Marland Kitchen apixaban (ELIQUIS) 5 MG TABS tablet Take 1 tablet (5 mg total) by mouth 2 (two) times daily. 56 tablet 0  . cyanocobalamin 2000 MCG tablet Take 2,000 mcg by mouth daily.    . DULoxetine (CYMBALTA) 60 MG capsule Take 60 mg by mouth daily.    Marland Kitchen esomeprazole (NEXIUM) 20 MG capsule Take 1 capsule (20 mg total) by mouth daily at 12 noon. 30 capsule 5  .  Fluticasone-Salmeterol (ADVAIR) 250-50 MCG/DOSE AEPB Inhale 1 puff into the lungs daily.     Marland Kitchen HYDROcodone-acetaminophen (NORCO/VICODIN) 5-325 MG tablet Take 1 tablet by mouth every 4 (four) hours as needed for moderate pain. 15 tablet 0  . metoprolol tartrate (LOPRESSOR) 25 MG tablet TAKE ONE & ONE-HALF TABLETS BY MOUTH TWICE DAILY 90 tablet 6  . polyethylene glycol powder (GLYCOLAX/MIRALAX) powder MIX 17 GRAMS WITH LIQUID AND TAKE BY MOUTH ONCE DAILY 527 g 5  . pyridOXINE (VITAMIN B-6) 100 MG tablet Take 100 mg by mouth daily.     No current facility-administered medications for this visit.      Past Surgical History:  Procedure Laterality Date  . ABDOMINAL HYSTERECTOMY    . COLONOSCOPY W/ BIOPSIES  02/01/2004   RMR: Anal papilla with normal rectum/Sigmoid diverticula/Small polyps in the cecum removed as described above. Inflammatory polyp  . ESOPHAGEAL DILATION     unknown year per patient.  . ESOPHAGEAL DILATION N/A 02/07/2015   RMR: Normal esophagus status post maloney dilation. Small hiatal hernia.   Marland Kitchen ESOPHAGOGASTRODUODENOSCOPY     unknown year per patient  . ESOPHAGOGASTRODUODENOSCOPY N/A 02/07/2015   RMR: Normal esophagus  status post  Maloney dilation. Small hiatal hernia  . NASAL SEPTUM SURGERY    .  TONSILLECTOMY    . WRIST FRACTURE SURGERY       Allergies  Allergen Reactions  . Latex Itching and Rash  . Adhesive [Tape] Other (See Comments)    Tears skin  . Ciprofloxacin Nausea And Vomiting  . Codeine Nausea And Vomiting    Upsets stomach, nightmares  . Penicillins Hives  . Sulfa Antibiotics Hives      Family History  Problem Relation Age of Onset  . Colon polyps Neg Hx   . Colon cancer Neg Hx      Social History [name] reports that she has never smoked. She has never used smokeless tobacco. [name] reports that she does not drink alcohol.   Review of Systems CONSTITUTIONAL: No weight loss, fever, chills, weakness or fatigue.  HEENT: Eyes: No visual  loss, blurred vision, double vision or yellow sclerae.No hearing loss, sneezing, congestion, runny nose or sore throat.  SKIN: No rash or itching.  CARDIOVASCULAR: per hpi RESPIRATORY: No shortness of breath, cough or sputum.  GASTROINTESTINAL: +diarrhea GENITOURINARY: No burning on urination, no polyuria NEUROLOGICAL: No headache, dizziness, syncope, paralysis, ataxia, numbness or tingling in the extremities. No change in bowel or bladder control.  MUSCULOSKELETAL: No muscle, back pain, joint pain or stiffness.  LYMPHATICS: No enlarged nodes. No history of splenectomy.  PSYCHIATRIC: No history of depression or anxiety.  ENDOCRINOLOGIC: No reports of sweating, cold or heat intolerance. No polyuria or polydipsia.  Marland Kitchen   Physical Examination Vitals:   01/14/17 1129  BP: (!) 146/92  Pulse: (!) 109   Vitals:   01/14/17 1129  Weight: 146 lb 6.4 oz (66.4 kg)  Height: 5\' 2"  (1.575 m)      Gen: resting comfortably, no acute distress HEENT: no scleral icterus, pupils equal round and reactive, no palptable cervical adenopathy,  CV: irreg, no m/r/,g no jvd Resp: Clear to auscultation bilaterally GI: abdomen is soft, non-tender, non-distended, normal bowel sounds, no hepatosplenomegaly MSK: extremities are warm, no edema.  Skin: warm, no rash Neuro:  no focal deficits Psych: appropriate affect   Diagnostic Studies 12/2013 Lexicscan MPI Impression Exercise Capacity: Lexiscan with no exercise.  BP Response: Normal blood pressure response.  Clinical Symptoms: Nausea  ECG Impression: No significant ST segment change suggestive of ischemia.  Comparison with Prior Nuclear Study: No previous nuclear study performed  Overall Impression: Normal stress nuclear study.  LV Ejection Fraction: 85%. LV Wall Motion: NL LV Function; NL Wall Motion    Jan 2015 echo Study Conclusions  - Study data: Technically difficult study. - Left ventricle: The cavity size was normal. Wall  thickness was increased in a pattern of mild LVH. There is mild basal septal hypertrophy without obstructive gradient. Systolic function was normal. The estimated ejection fraction was in the range of 60% to 65%. Doppler parameters are consistent with abnormal left ventricular relaxation (grade 1 diastolic dysfunction). - Aortic valve: Mildly calcified annulus. Trileaflet; mildly thickened leaflets. Trivial regurgitation. Valve area: 1.69cm^2(VTI). Valve area: 1.79cm^2 (Vmax). - Mitral valve: Mildly calcified annulus. Mildly thickened leaflets .   01/2015 Holter monitor No significant arrhythmias    Assessment and Plan  1. Parox afib - we will continue current meds. CHADS2Vasc score is 4, continue eliquis.  - recent palpitatoins. Did not tolerate high doses of lopressor, will try starting dilt 30mg  bid.    2. HTN - elevated in clinic, starting dilt as described above.     3. Diarrhea - chronic, refer to GI.   Arnoldo Lenis, M.D.

## 2017-01-20 ENCOUNTER — Ambulatory Visit: Payer: Medicare HMO | Admitting: Obstetrics & Gynecology

## 2017-01-21 ENCOUNTER — Ambulatory Visit: Payer: Medicare HMO | Admitting: Obstetrics & Gynecology

## 2017-01-21 ENCOUNTER — Telehealth: Payer: Self-pay | Admitting: Cardiology

## 2017-01-21 NOTE — Telephone Encounter (Signed)
Audry Pili (son ) called wanting to speak with someone about Mrs. Holloway's medications. Please call [phone].

## 2017-01-21 NOTE — Telephone Encounter (Signed)
Spoke with son.  Stated that Beadie had gotten her medications confused with Jeneen Rinks (husband).  Reviewed medication list for both Yena Tisby with son.  Will mail current medication list for both patients to son at his request.    [address] [place] [address], [place]  [id number]

## 2017-01-24 ENCOUNTER — Telehealth: Payer: Self-pay | Admitting: Cardiology

## 2017-01-24 NOTE — Telephone Encounter (Signed)
Has medciation questions  She is confused about the new instructions she was given

## 2017-01-24 NOTE — Telephone Encounter (Signed)
Pt wanted clarification of if she should be taking diltiazem 30 mg twice daily and metoprolol 1 and 1/2 tablets twice daily - pt voiced understanding of how she should be taking these

## 2017-01-28 ENCOUNTER — Ambulatory Visit (INDEPENDENT_AMBULATORY_CARE_PROVIDER_SITE_OTHER): Payer: Medicare HMO | Admitting: Obstetrics & Gynecology

## 2017-01-28 ENCOUNTER — Encounter: Payer: Self-pay | Admitting: Obstetrics & Gynecology

## 2017-01-28 VITALS — BP 138/86 | HR 64 | Ht 62.0 in | Wt 144.0 lb

## 2017-01-28 DIAGNOSIS — N993 Prolapse of vaginal vault after hysterectomy: Secondary | ICD-10-CM | POA: Diagnosis not present

## 2017-01-28 DIAGNOSIS — Z4689 Encounter for fitting and adjustment of other specified devices: Secondary | ICD-10-CM | POA: Diagnosis not present

## 2017-01-28 NOTE — Progress Notes (Signed)
Patient ID: Sara Garcia, female   DOB: 06/16/1928, 83 y.o.   MRN: [id number]   Pt decided she wanted the pessary replaced due to pressure and back pain   Patient ID: Sara Garcia, female   DOB: [birth year], 83 y.o.   MRN: [id number] Chief Complaint  Patient presents with  . Follow-up    pessary    Blood pressure 138/86, pulse 64, height 5\' 2"  (1.575 m), weight 144 lb (65.3 kg).  Sara Garcia presents today for routine follow up related to her pessary.   She uses a milex ring #2 She reports no vaginal discharge or vaginal bleeding.  Exam reveals no undue vaginal mucosal pressure of breakdown, no discharge and no vaginal bleeding.  The pessary is removed, cleaned and replaced without difficulty.    Sara Garcia will be seen back in 4 months for continued follow up.  Florian Buff, MD   01/28/2017 10:46 AM

## 2017-02-03 ENCOUNTER — Ambulatory Visit: Payer: Medicare HMO | Admitting: Nurse Practitioner

## 2017-02-10 ENCOUNTER — Telehealth: Payer: Self-pay | Admitting: Cardiology

## 2017-02-10 NOTE — Telephone Encounter (Signed)
Wanted to let someone know that the originiall package of eliquis that was mis delivered ended up at her house.   It was delivered to her brother's house and she now has it.

## 2017-02-11 NOTE — Telephone Encounter (Signed)
Noted  

## 2017-02-17 ENCOUNTER — Telehealth: Payer: Self-pay | Admitting: *Deleted

## 2017-02-17 NOTE — Telephone Encounter (Signed)
Informed patient of need for urine culture. Patient stated she would be able to come tomorrow.

## 2017-02-17 NOTE — Telephone Encounter (Signed)
Ok

## 2017-02-17 NOTE — Telephone Encounter (Signed)
Can she bring a urine in for culture?

## 2017-02-18 ENCOUNTER — Other Ambulatory Visit: Payer: Self-pay | Admitting: *Deleted

## 2017-02-18 DIAGNOSIS — R300 Dysuria: Secondary | ICD-10-CM

## 2017-02-19 ENCOUNTER — Telehealth: Payer: Self-pay | Admitting: Obstetrics & Gynecology

## 2017-02-19 NOTE — Telephone Encounter (Signed)
Spoke with pt letting her know urine culture not back yet. Call back Friday if she hasn't heard anything yet. Pt voiced understanding. [place]

## 2017-02-21 ENCOUNTER — Telehealth: Payer: Self-pay | Admitting: *Deleted

## 2017-02-21 LAB — URINE CULTURE

## 2017-02-21 NOTE — Telephone Encounter (Signed)
Informed patient urine culture has not resulted yet. Can take up to a week for results. Verbalized understanding.

## 2017-02-24 ENCOUNTER — Other Ambulatory Visit: Payer: Self-pay | Admitting: Obstetrics & Gynecology

## 2017-02-24 ENCOUNTER — Other Ambulatory Visit: Payer: Self-pay | Admitting: *Deleted

## 2017-02-24 MED ORDER — DILTIAZEM HCL 30 MG PO TABS: 30.0000 mg | ORAL_TABLET | Freq: Two times a day (BID) | ORAL | 1 refills | Status: DC

## 2017-02-24 MED ORDER — CEPHALEXIN 500 MG PO CAPS: 500.0000 mg | ORAL_CAPSULE | Freq: Three times a day (TID) | ORAL | 0 refills | Status: DC

## 2017-02-24 MED ORDER — METOPROLOL TARTRATE 25 MG PO TABS: 37.5000 mg | ORAL_TABLET | Freq: Two times a day (BID) | ORAL | 1 refills | Status: DC

## 2017-02-24 NOTE — Telephone Encounter (Signed)
Informed patient prescription sent to pharmacy for UTI. Verbalized understanding.
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
## 2017-05-08 ENCOUNTER — Telehealth: Payer: Self-pay | Admitting: *Deleted

## 2017-05-08 NOTE — Telephone Encounter (Signed)
Pt requesting to be seen by Dr Harl Bowie for some tightness in chest off and on along with feeling that BP is running high for the last couple of week (pt doesn't have a way to check it at home) also wanted to discuss medications - denies chest pain/dizziness/swelling/SOB - says she does have upcoming appt with pcp in the next week - Dr Harl Bowie next available is 9/18 - will see if we should schedule nurse visit

## 2017-05-08 NOTE — Telephone Encounter (Signed)
Pt scheduled with Melina Copa, PA 8/8 - pt voiced understanding and says she will f/u with pcp next week and if symptoms worsen she will go to the ED.

## 2017-05-08 NOTE — Telephone Encounter (Signed)
Yes, she should have her initial evaluation next week with pcp and then can have her f/u with the PA 05/20/17. If symptoms significantly worsen may need to come to the ER   Zandra Abts MD

## 2017-05-08 NOTE — Telephone Encounter (Signed)
1st available with extender is 8/7 - schedule her for then?

## 2017-05-08 NOTE — Telephone Encounter (Signed)
Can we get her a PA appt within the week?   Zandra Abts MD

## 2017-05-16 ENCOUNTER — Ambulatory Visit: Payer: Medicare HMO | Admitting: Obstetrics & Gynecology

## 2017-05-21 ENCOUNTER — Ambulatory Visit: Payer: Medicare HMO | Admitting: Physician Assistant

## 2017-05-27 ENCOUNTER — Encounter: Payer: Self-pay | Admitting: Orthopedic Surgery

## 2017-05-27 ENCOUNTER — Ambulatory Visit (INDEPENDENT_AMBULATORY_CARE_PROVIDER_SITE_OTHER): Payer: Medicare HMO

## 2017-05-27 ENCOUNTER — Ambulatory Visit (INDEPENDENT_AMBULATORY_CARE_PROVIDER_SITE_OTHER): Payer: Medicare HMO | Admitting: Orthopedic Surgery

## 2017-05-27 VITALS — BP 104/63 | HR 69 | Wt 149.0 lb

## 2017-05-27 DIAGNOSIS — M25561 Pain in right knee: Secondary | ICD-10-CM | POA: Diagnosis not present

## 2017-05-27 DIAGNOSIS — M1711 Unilateral primary osteoarthritis, right knee: Secondary | ICD-10-CM | POA: Diagnosis not present

## 2017-05-27 DIAGNOSIS — G8929 Other chronic pain: Secondary | ICD-10-CM

## 2017-05-27 NOTE — Patient Instructions (Signed)

## 2017-05-27 NOTE — Progress Notes (Signed)
Sara Garcia is a 83 y.o. female   HPI:  Knee Pain: Patient presents with knee pain involving the  right knee. Onset of the symptoms was several years ago. Inciting event: none known. Current symptoms include giving out, pain located Medial joint, popping sensation and swelling. Pain is aggravated by any weight bearing, going up and down stairs, rising after sitting and walking.  Patient has had prior knee problems. Evaluation to date: plain films: abnormal With osteoarthritis back in 2015. She was treated with a brace. Treatment to date: brace which is somewhat effective and elastic supporter which is somewhat effective.  Past Medical History:  Diagnosis Date  . A-fib ([place])   . Arthritis   . Asthma   . Dementia   . DVT (deep venous thrombosis) ([place]) 2011/2012   chronic coumadin, h/o blood clots on coumadin.  . Esophageal dysphagia    limited food and pill  . Esophageal reflux   . Essential hypertension, benign   . GERD (gastroesophageal reflux disease)   . Osteoporosis   . Unspecified prolapse of vaginal walls    Past Surgical History:  Procedure Laterality Date  . ABDOMINAL HYSTERECTOMY    . COLONOSCOPY W/ BIOPSIES  02/01/2004   RMR: Anal papilla with normal rectum/Sigmoid diverticula/Small polyps in the cecum removed as described above. Inflammatory polyp  . ESOPHAGEAL DILATION     unknown year per patient.  . ESOPHAGEAL DILATION N/A 02/07/2015   RMR: Normal esophagus status post maloney dilation. Small hiatal hernia.   Marland Kitchen ESOPHAGOGASTRODUODENOSCOPY     unknown year per patient  . ESOPHAGOGASTRODUODENOSCOPY N/A 02/07/2015   RMR: Normal esophagus  status post  Maloney dilation. Small hiatal hernia  . NASAL SEPTUM SURGERY    . TONSILLECTOMY    . WRIST FRACTURE SURGERY      Current Outpatient Prescriptions:  .  acetaminophen (TYLENOL) 500 MG tablet, Take 1,000 mg by mouth 2 (two) times daily as needed for mild pain, moderate pain or headache. , Disp: , Rfl:  .  apixaban (ELIQUIS) 5  MG TABS tablet, Take 1 tablet (5 mg total) by mouth 2 (two) times daily., Disp: 42 tablet, Rfl: 0 .  Calcium Carb-Cholecalciferol (CALCIUM-VITAMIN D) 500-200 MG-UNIT tablet, Take 1 tablet by mouth daily., Disp: , Rfl:  .  cyanocobalamin 2000 MCG tablet, Take 2,000 mcg by mouth daily., Disp: , Rfl:  .  diltiazem (CARDIZEM) 30 MG tablet, Take 1 tablet (30 mg total) by mouth 2 (two) times daily., Disp: 180 tablet, Rfl: 1 .  DULoxetine (CYMBALTA) 60 MG capsule, Take 60 mg by mouth daily., Disp: , Rfl:  .  esomeprazole (NEXIUM) 20 MG capsule, Take 1 capsule (20 mg total) by mouth daily at 12 noon., Disp: 30 capsule, Rfl: 5 .  Fluticasone-Salmeterol (ADVAIR) 250-50 MCG/DOSE AEPB, Inhale 1 puff into the lungs daily. , Disp: , Rfl:  .  metoprolol tartrate (LOPRESSOR) 25 MG tablet, Take 1.5 tablets (37.5 mg total) by mouth 2 (two) times daily., Disp: 270 tablet, Rfl: 1 .  polyethylene glycol powder (GLYCOLAX/MIRALAX) powder, MIX 17 GRAMS WITH LIQUID AND TAKE BY MOUTH ONCE DAILY, Disp: 527 g, Rfl: 5 .  pyridOXINE (VITAMIN B-6) 100 MG tablet, Take 100 mg by mouth daily., Disp: , Rfl:  Allergies  Allergen Reactions  . Latex Itching and Rash  . Adhesive [Tape] Other (See Comments)    Tears skin  . Ciprofloxacin Nausea And Vomiting  . Codeine Nausea And Vomiting    Upsets stomach, nightmares  . Penicillins Hives  .  Sulfa Antibiotics Hives    reports that she has never smoked. She has never used smokeless tobacco. She reports that she does not drink alcohol or use drugs. Family History  Problem Relation Age of Onset  . Colon polyps Neg Hx   . Colon cancer Neg Hx    Physical Exam  Constitutional: She is oriented to person, place, and time. She appears well-developed and well-nourished.  Musculoskeletal:       Right knee: Medial joint line tenderness noted.  Neurological: She is alert and oriented to person, place, and time.  Psychiatric: She has a normal mood and affect.  Vitals reviewed.  Gait  and supported by a cane use  Right Knee Exam  Swelling: None Effusion: No  Tenderness  The patient is experiencing tenderness in the medial joint line.  Range of Motion  Normal right knee ROM  Muscle Strength  Normal right knee strength  Tests  Drawer:       Anterior - Negative    Posterior - Negative Varus:  Negative Valgus: Negative  Comments:  Neurovascular exam remains intact    LEFT Knee: Normal to inspection with no erythema or effusion or obvious bony abnormalities. Palpation normal with no warmth or joint line tenderness or patellar tenderness or condyle tenderness. ROM normal in flexion and extension and lower leg rotation. Ligaments with solid consistent endpoints including ACL, PCL, LCL, MCL. Negative Mcmurray's and provocative meniscal tests. Non painful patellar compression. Patellar and quadriceps tendons unremarkable. Hamstring and quadriceps strength is normal.  XRAYS:   Overall tibiofemoral alignment is just about neutral she has an oblique proximal tibial joint line joint space narrowing moderate to severe medial and lateral compartment with some osteopenia  Encounter Diagnoses  Name Primary?  . Chronic pain of right knee Yes  . Primary osteoarthritis of right knee     Right knee injection Right knee economy brace Right knee exercise program  We discussed that she preferred not to have knee replacement surgery

## 2017-05-30 ENCOUNTER — Encounter: Payer: Self-pay | Admitting: Obstetrics & Gynecology

## 2017-05-30 ENCOUNTER — Ambulatory Visit (INDEPENDENT_AMBULATORY_CARE_PROVIDER_SITE_OTHER): Payer: Medicare HMO | Admitting: Obstetrics & Gynecology

## 2017-05-30 VITALS — Wt 146.0 lb

## 2017-05-30 DIAGNOSIS — N993 Prolapse of vaginal vault after hysterectomy: Secondary | ICD-10-CM | POA: Diagnosis not present

## 2017-05-30 DIAGNOSIS — Z4689 Encounter for fitting and adjustment of other specified devices: Secondary | ICD-10-CM | POA: Diagnosis not present

## 2017-05-30 NOTE — Progress Notes (Signed)
Chief Complaint  Patient presents with  . Pessary Check    clean   Weight 146 lb (66.2 kg).  Sara Garcia is in today for follow-up related to her pessary She has used a Milex ring #2 for several years Today she reports no vaginal bleeding or discharge  Exam reveals no vaginal mucosal breakdown no discharge and no bleeding  The pessary is removed cleaned and replaced without difficulty  Sara Garcia will be seen back in 4 months for continued follow-up or if indicated prior to that time  Florian Buff, MD 05/30/2017 10:04 AM

## 2017-06-09 ENCOUNTER — Ambulatory Visit: Payer: Medicare HMO | Admitting: Orthopedic Surgery

## 2017-06-10 ENCOUNTER — Emergency Department (HOSPITAL_COMMUNITY): Payer: Medicare HMO

## 2017-06-10 ENCOUNTER — Inpatient Hospital Stay (HOSPITAL_COMMUNITY)
Admission: EM | Admit: 2017-06-10 | Discharge: 2017-06-18 | DRG: 481 | Disposition: A | Discharge status: Skilled Nursing Facility | Payer: Medicare HMO | Attending: Internal Medicine | Admitting: Internal Medicine

## 2017-06-10 ENCOUNTER — Encounter (HOSPITAL_COMMUNITY): Payer: Self-pay | Admitting: *Deleted

## 2017-06-10 DIAGNOSIS — Z91040 Latex allergy status: Secondary | ICD-10-CM | POA: Diagnosis not present

## 2017-06-10 DIAGNOSIS — N390 Urinary tract infection, site not specified: Secondary | ICD-10-CM | POA: Diagnosis present

## 2017-06-10 DIAGNOSIS — Z86718 Personal history of other venous thrombosis and embolism: Secondary | ICD-10-CM

## 2017-06-10 DIAGNOSIS — Z882 Allergy status to sulfonamides status: Secondary | ICD-10-CM

## 2017-06-10 DIAGNOSIS — Z90710 Acquired absence of both cervix and uterus: Secondary | ICD-10-CM

## 2017-06-10 DIAGNOSIS — Z09 Encounter for follow-up examination after completed treatment for conditions other than malignant neoplasm: Secondary | ICD-10-CM

## 2017-06-10 DIAGNOSIS — K219 Gastro-esophageal reflux disease without esophagitis: Secondary | ICD-10-CM | POA: Diagnosis present

## 2017-06-10 DIAGNOSIS — Z66 Do not resuscitate: Secondary | ICD-10-CM | POA: Diagnosis present

## 2017-06-10 DIAGNOSIS — Z7901 Long term (current) use of anticoagulants: Secondary | ICD-10-CM

## 2017-06-10 DIAGNOSIS — F329 Major depressive disorder, single episode, unspecified: Secondary | ICD-10-CM | POA: Diagnosis present

## 2017-06-10 DIAGNOSIS — F0390 Unspecified dementia without behavioral disturbance: Secondary | ICD-10-CM | POA: Diagnosis present

## 2017-06-10 DIAGNOSIS — Z79899 Other long term (current) drug therapy: Secondary | ICD-10-CM

## 2017-06-10 DIAGNOSIS — W06XXXA Fall from bed, initial encounter: Secondary | ICD-10-CM | POA: Diagnosis present

## 2017-06-10 DIAGNOSIS — I480 Paroxysmal atrial fibrillation: Secondary | ICD-10-CM | POA: Diagnosis present

## 2017-06-10 DIAGNOSIS — I4891 Unspecified atrial fibrillation: Secondary | ICD-10-CM | POA: Diagnosis present

## 2017-06-10 DIAGNOSIS — S72142A Displaced intertrochanteric fracture of left femur, initial encounter for closed fracture: Principal | ICD-10-CM | POA: Diagnosis present

## 2017-06-10 DIAGNOSIS — Z9889 Other specified postprocedural states: Secondary | ICD-10-CM | POA: Diagnosis not present

## 2017-06-10 DIAGNOSIS — Z880 Allergy status to penicillin: Secondary | ICD-10-CM | POA: Diagnosis not present

## 2017-06-10 DIAGNOSIS — I10 Essential (primary) hypertension: Secondary | ICD-10-CM | POA: Diagnosis present

## 2017-06-10 DIAGNOSIS — S72002A Fracture of unspecified part of neck of left femur, initial encounter for closed fracture: Secondary | ICD-10-CM | POA: Diagnosis present

## 2017-06-10 DIAGNOSIS — J45909 Unspecified asthma, uncomplicated: Secondary | ICD-10-CM | POA: Diagnosis present

## 2017-06-10 DIAGNOSIS — B9620 Unspecified Escherichia coli [E. coli] as the cause of diseases classified elsewhere: Secondary | ICD-10-CM | POA: Diagnosis present

## 2017-06-10 DIAGNOSIS — M810 Age-related osteoporosis without current pathological fracture: Secondary | ICD-10-CM | POA: Diagnosis present

## 2017-06-10 DIAGNOSIS — Z888 Allergy status to other drugs, medicaments and biological substances status: Secondary | ICD-10-CM | POA: Diagnosis not present

## 2017-06-10 DIAGNOSIS — M1990 Unspecified osteoarthritis, unspecified site: Secondary | ICD-10-CM | POA: Diagnosis present

## 2017-06-10 LAB — BASIC METABOLIC PANEL
Anion gap: 8 (ref 5–15)
BUN: 26 mg/dL — ABNORMAL HIGH (ref 6–20)
CO2: 21 mmol/L — ABNORMAL LOW (ref 22–32)
Calcium: 7.5 mg/dL — ABNORMAL LOW (ref 8.9–10.3)
Chloride: 114 mmol/L — ABNORMAL HIGH (ref 101–111)
Creatinine, Ser: 0.81 mg/dL (ref 0.44–1.00)
GFR calc Af Amer: >60 mL/min (ref >60)
GFR calc non Af Amer: >60 mL/min (ref >60)
Glucose, Bld: 124 mg/dL — ABNORMAL HIGH (ref 65–99)
Potassium: 4.7 mmol/L (ref 3.5–5.1)
Sodium: 143 mmol/L (ref 135–145)

## 2017-06-10 LAB — CBC WITH DIFFERENTIAL/PLATELET
Basophils Absolute: 0 10*3/uL (ref 0.0–0.1)
Basophils Relative: 0 %
Eosinophils Absolute: 0.1 10*3/uL (ref 0.0–0.7)
Eosinophils Relative: 1 %
HCT: 35.7 % — ABNORMAL LOW (ref 36.0–46.0)
Hemoglobin: 11.6 g/dL — ABNORMAL LOW (ref 12.0–15.0)
Lymphocytes Relative: 16 %
Lymphs Abs: 2.2 10*3/uL (ref 0.7–4.0)
MCH: 28.4 pg (ref 26.0–34.0)
MCHC: 32.5 g/dL (ref 30.0–36.0)
MCV: 87.5 fL (ref 78.0–100.0)
Monocytes Absolute: 1.3 10*3/uL — ABNORMAL HIGH (ref 0.1–1.0)
Monocytes Relative: 9 %
Neutro Abs: 10.7 10*3/uL — ABNORMAL HIGH (ref 1.7–7.7)
Neutrophils Relative %: 74 %
Platelets: 224 10*3/uL (ref 150–400)
RBC: 4.08 MIL/uL (ref 3.87–5.11)
RDW: 14.4 % (ref 11.5–15.5)
WBC: 14.3 10*3/uL — ABNORMAL HIGH (ref 4.0–10.5)

## 2017-06-10 LAB — TYPE AND SCREEN
ABO/RH(D): O NEG
Antibody Screen: NEGATIVE

## 2017-06-10 LAB — URINALYSIS, ROUTINE W REFLEX MICROSCOPIC
Bilirubin Urine: NEGATIVE
Glucose, UA: NEGATIVE mg/dL
Hgb urine dipstick: NEGATIVE
Ketones, ur: NEGATIVE mg/dL
Leukocytes, UA: NEGATIVE
Nitrite: NEGATIVE
Protein, ur: NEGATIVE mg/dL
Specific Gravity, Urine: 1.02 (ref 1.005–1.030)
pH: 6.5 (ref 5.0–8.0)

## 2017-06-10 LAB — SURGICAL PCR SCREEN
MRSA, PCR: NEGATIVE
Staphylococcus aureus: POSITIVE — AB

## 2017-06-10 LAB — PROTIME-INR
INR: 1.27
Prothrombin Time: 16 seconds — ABNORMAL HIGH (ref 11.4–15.2)

## 2017-06-10 LAB — ABO/RH: ABO/RH(D): O NEG

## 2017-06-10 LAB — APTT: aPTT: 31 seconds (ref 24–36)

## 2017-06-10 MED ORDER — DULOXETINE HCL 60 MG PO CPEP
60.0000 mg | ORAL_CAPSULE | Freq: Two times a day (BID) | ORAL | Status: DC
  Administered 2017-06-10 – 2017-06-18 (×15): 60 mg via ORAL
  Filled 2017-06-10 (×15): qty 1

## 2017-06-10 MED ORDER — METOPROLOL TARTRATE 25 MG PO TABS
37.5000 mg | ORAL_TABLET | Freq: Two times a day (BID) | ORAL | Status: DC
  Administered 2017-06-10 – 2017-06-18 (×15): 37.5 mg via ORAL
  Filled 2017-06-10 (×16): qty 2

## 2017-06-10 MED ORDER — SODIUM CHLORIDE 0.9 % IV SOLN
INTRAVENOUS | Status: DC
  Administered 2017-06-10: 07:00:00 via INTRAVENOUS

## 2017-06-10 MED ORDER — POLYETHYLENE GLYCOL 3350 17 G PO PACK
17.0000 g | PACK | Freq: Every day | ORAL | Status: DC | PRN
  Administered 2017-06-13 – 2017-06-16 (×4): 17 g via ORAL
  Filled 2017-06-10 (×4): qty 1

## 2017-06-10 MED ORDER — SODIUM CHLORIDE 0.9% FLUSH
3.0000 mL | Freq: Two times a day (BID) | INTRAVENOUS | Status: DC
  Administered 2017-06-10 – 2017-06-18 (×8): 3 mL via INTRAVENOUS

## 2017-06-10 MED ORDER — PANTOPRAZOLE SODIUM 40 MG PO TBEC
40.0000 mg | DELAYED_RELEASE_TABLET | Freq: Every day | ORAL | Status: DC
  Administered 2017-06-10 – 2017-06-18 (×9): 40 mg via ORAL
  Filled 2017-06-10 (×9): qty 1

## 2017-06-10 MED ORDER — DILTIAZEM HCL 30 MG PO TABS
30.0000 mg | ORAL_TABLET | Freq: Two times a day (BID) | ORAL | Status: DC
  Administered 2017-06-10 – 2017-06-18 (×15): 30 mg via ORAL
  Filled 2017-06-10 (×15): qty 1

## 2017-06-10 MED ORDER — ONDANSETRON HCL 4 MG/2ML IJ SOLN
4.0000 mg | Freq: Four times a day (QID) | INTRAMUSCULAR | Status: DC | PRN
  Administered 2017-06-16: 4 mg via INTRAVENOUS
  Filled 2017-06-10: qty 2

## 2017-06-10 MED ORDER — ONDANSETRON HCL 4 MG/2ML IJ SOLN
4.0000 mg | Freq: Once | INTRAMUSCULAR | Status: AC
  Administered 2017-06-10: 4 mg via INTRAVENOUS
  Filled 2017-06-10: qty 2

## 2017-06-10 MED ORDER — ACETAMINOPHEN 650 MG RE SUPP: 650.0000 mg | Freq: Four times a day (QID) | RECTAL | Status: DC | PRN

## 2017-06-10 MED ORDER — BISACODYL 5 MG PO TBEC: 5.0000 mg | DELAYED_RELEASE_TABLET | Freq: Every day | ORAL | Status: DC | PRN

## 2017-06-10 MED ORDER — DILTIAZEM HCL 30 MG PO TABS: 30.0000 mg | ORAL_TABLET | Freq: Two times a day (BID) | ORAL | Status: DC

## 2017-06-10 MED ORDER — SODIUM CHLORIDE 0.9% FLUSH: 3.0000 mL | INTRAVENOUS | Status: DC | PRN

## 2017-06-10 MED ORDER — MOMETASONE FURO-FORMOTEROL FUM 200-5 MCG/ACT IN AERO
2.0000 | INHALATION_SPRAY | Freq: Two times a day (BID) | RESPIRATORY_TRACT | Status: DC
  Administered 2017-06-10 – 2017-06-18 (×14): 2 via RESPIRATORY_TRACT
  Filled 2017-06-10: qty 8.8

## 2017-06-10 MED ORDER — SODIUM CHLORIDE 0.9 % IV SOLN: 250.0000 mL | INTRAVENOUS | Status: DC | PRN

## 2017-06-10 MED ORDER — METOPROLOL TARTRATE 25 MG PO TABS: 37.5000 mg | ORAL_TABLET | Freq: Two times a day (BID) | ORAL | Status: DC

## 2017-06-10 MED ORDER — FENTANYL CITRATE (PF) 100 MCG/2ML IJ SOLN
50.0000 ug | Freq: Once | INTRAMUSCULAR | Status: AC
  Administered 2017-06-10: 50 ug via INTRAVENOUS
  Filled 2017-06-10: qty 2

## 2017-06-10 MED ORDER — HYDROMORPHONE HCL 1 MG/ML IJ SOLN
1.0000 mg | INTRAMUSCULAR | Status: DC | PRN
  Administered 2017-06-10: 1 mg via INTRAVENOUS
  Filled 2017-06-10: qty 1

## 2017-06-10 MED ORDER — OXYCODONE HCL 5 MG PO TABS
5.0000 mg | ORAL_TABLET | ORAL | Status: DC | PRN
  Administered 2017-06-10 – 2017-06-11 (×3): 5 mg via ORAL
  Filled 2017-06-10 (×3): qty 1

## 2017-06-10 MED ORDER — HYDROMORPHONE HCL-NACL 0.5-0.9 MG/ML-% IV SOSY
1.0000 mg | PREFILLED_SYRINGE | INTRAVENOUS | Status: DC | PRN
  Administered 2017-06-10 – 2017-06-17 (×12): 1 mg via INTRAVENOUS
  Filled 2017-06-10 (×12): qty 2

## 2017-06-10 MED ORDER — ONDANSETRON HCL 4 MG PO TABS
4.0000 mg | ORAL_TABLET | Freq: Four times a day (QID) | ORAL | Status: DC | PRN
  Administered 2017-06-18: 4 mg via ORAL
  Filled 2017-06-10: qty 1

## 2017-06-10 MED ORDER — ACETAMINOPHEN 325 MG PO TABS
650.0000 mg | ORAL_TABLET | Freq: Four times a day (QID) | ORAL | Status: DC | PRN
  Administered 2017-06-18: 650 mg via ORAL
  Filled 2017-06-10: qty 2

## 2017-06-10 NOTE — ED Provider Notes (Signed)
TIME SEEN: 5:40 AM  CHIEF COMPLAINT: fall  HPI: Pt is a 83 y.o. female with history of dementia, atrial fibrillation on [place] with, hypertension who presents to the emergency department via EMS after she fell. States she rolled out of bed and fell onto her left side. She states she does not think that she had her head. She states this happened a her in March 2018 and she had a fracture in her back. She is requesting x-rays of her back today although she is not having any neck or back pain. Denies numbness, tingling or focal weakness.Denies chest or abdominal pain. She states she normally ambulates with a cane.  She did receive 10 mg of morphine with EMS.  ROS: See HPI Constitutional: no fever  Eyes: no drainage  ENT: no runny nose   Cardiovascular:  no chest pain  Resp: no SOB  GI: no vomiting GU: no dysuria Integumentary: no rash  Allergy: no hives  Musculoskeletal: no leg swelling  Neurological: no slurred speech ROS otherwise negative  PAST MEDICAL HISTORY/PAST SURGICAL HISTORY:  Past Medical History:  Diagnosis Date  . A-fib ([place])   . Arthritis   . Asthma   . Dementia   . DVT (deep venous thrombosis) ([place]) 2011/2012   chronic coumadin, h/o blood clots on coumadin.  . Esophageal dysphagia    limited food and pill  . Esophageal reflux   . Essential hypertension, benign   . GERD (gastroesophageal reflux disease)   . Osteoporosis   . Unspecified prolapse of vaginal walls     MEDICATIONS:  Prior to Admission medications   Medication Sig Start Date End Date Taking? Authorizing Provider  acetaminophen (TYLENOL) 500 MG tablet Take 1,000 mg by mouth 2 (two) times daily as needed for mild pain, moderate pain or headache.     [provider]  apixaban (ELIQUIS) 5 MG TABS tablet Take 1 tablet (5 mg total) by mouth 2 (two) times daily. 01/14/17   Arnoldo Lenis, MD  Calcium Carb-Cholecalciferol (CALCIUM-VITAMIN D) 500-200 MG-UNIT tablet Take 1 tablet by mouth daily.     [provider]  cyanocobalamin 2000 MCG tablet Take 2,000 mcg by mouth daily.    [provider]  diltiazem (CARDIZEM) 30 MG tablet Take 1 tablet (30 mg total) by mouth 2 (two) times daily. 02/24/17   Arnoldo Lenis, MD  DULoxetine (CYMBALTA) 60 MG capsule Take 60 mg by mouth 2 (two) times daily.     [provider]  esomeprazole (NEXIUM) 20 MG capsule Take 1 capsule (20 mg total) by mouth daily at 12 noon. 11/06/16   Carlis Stable, NP  Fluticasone-Salmeterol (ADVAIR) 250-50 MCG/DOSE AEPB Inhale 1 puff into the lungs daily.     [provider]  metoprolol tartrate (LOPRESSOR) 25 MG tablet Take 1.5 tablets (37.5 mg total) by mouth 2 (two) times daily. 02/24/17   Arnoldo Lenis, MD  polyethylene glycol powder (GLYCOLAX/MIRALAX) powder MIX 17 GRAMS WITH LIQUID AND TAKE BY MOUTH ONCE DAILY 12/13/16   Mahala Menghini, PA-C  pyridOXINE (VITAMIN B-6) 100 MG tablet Take 100 mg by mouth daily.    [provider]    ALLERGIES:  Allergies  Allergen Reactions  . Latex Itching and Rash  . Adhesive [Tape] Other (See Comments)    Tears skin  . Ciprofloxacin Nausea And Vomiting  . Codeine Nausea And Vomiting    Upsets stomach, nightmares  . Penicillins Hives  . Sulfa Antibiotics Hives    SOCIAL HISTORY:  Social History  Substance Use Topics  . Smoking status: Never Smoker  . Smokeless tobacco: Never Used  . Alcohol use No    FAMILY HISTORY: Family History  Problem Relation Age of Onset  . Colon polyps Neg Hx   . Colon cancer Neg Hx     EXAM: BP (!) 170/95 (BP Location: Left Arm)   Pulse (!) 103   Resp 16   SpO2 94%  CONSTITUTIONAL: Alert and oriented x 3 and responds appropriately to questions. Elderly, in no distress; GCS 15 HEAD: Normocephalic; atraumatic EYES: Conjunctivae clear, PERRL, EOMI ENT: normal nose; no rhinorrhea; moist mucous membranes; pharynx without lesions noted; no dental injury; no septal hematoma NECK: Supple, no  meningismus, no LAD; no midline spinal tenderness, step-off or deformity; trachea midline CARD: RRR; S1 and S2 appreciated; no murmurs, no clicks, no rubs, no gallops RESP: Normal chest excursion without splinting or tachypnea; breath sounds clear and equal bilaterally; no wheezes, no rhonchi, no rales; no hypoxia or respiratory distress CHEST:  chest wall stable, no crepitus or ecchymosis or deformity, nontender to palpation; no flail chest ABD/GI: Normal bowel sounds; non-distended; soft, non-tender, no rebound, no guarding; no ecchymosis or other lesions noted PELVIS:  stable, nontender to palpation BACK:  The back appears normal and is non-tender to palpation, there is no CVA tenderness; no midline spinal tenderness, step-off or deformity EXT: Obvious deformity to the left proximal hip with tenderness to palpation over this area.  No tenderness over the distal femur, knee, tibia or fibula, foot of the left leg. Decreased range of motion in the left leg secondary to pain in the left hip. Otherwise normal ROM in all joints; otherwise externally is arenon-tender to palpation; no edema; normal capillary refill; no cyanosis, no joint effusion, compartments are soft, extremities are warm and well-perfused, no ecchymosis SKIN: Normal color for age and race; warm NEURO: Moves all extremities equally, reports normal sensation diffusely; cranial nerves II through X intact, normal speech PSYCH: The patient's mood and manner are appropriate. Grooming and personal hygiene are appropriate.  MEDICAL DECISION MAKING: Patient here with a fall out of bed with obvious left hip deformity.  She states she did not hit her head but is on Eliquis. Will obtain a CT of her head. She is requesting imaging of her back as well but she is not having any midline tenderness at this time.  We'll give fentanyl for pain control. Will obtain x-ray of her hip, thoracic and lumbar spine, CT of her head and cervical spine. We'll obtain  labs.  ED PROGRESS: Patient's hip x-ray shows intertrochanteric left hip fracture with varus angulation. CT of the cervical spine incidentally notes biapical groundglass opacities likely from pulmonary edema.  Chest x-ray however appears clear. We will hold on IV hydration at this time. She does not feel short of breath. She is on oxygen after receiving narcotics with EMS.  Otherwise imaging shows no acute abnormality. Labs, urine pending. We will place a Foley catheter.  Family and patient have been updated that she has a fracture that will need surgical repair. They state she has seen Dr. Aline Brochure in [place] in the past but they would like to see a different orthopedic physician here in [place].  Pt is NPO.   7:30 AM  Signed out to Dr. Wilson Singer who will follow up on patient's labs and discuss with ortho and hospitalist.    I reviewed all nursing notes, vitals, pertinent previous records, EKGs, lab and urine results, imaging (  as available).     EKG Interpretation  Date/Time:  Tuesday June 10 2017 05:45:52 EDT Ventricular Rate:  99 PR Interval:    QRS Duration: 117 QT Interval:  366 QTC Calculation: 470 R Axis:   16 Text Interpretation:  Sinus or ectopic atrial rhythm Nonspecific intraventricular conduction delay Borderline low voltage, extremity leads Confirmed by Pryor Curia [id number]) on 06/10/2017 5:47:50 AM         Amauri Keefe, Delice Bison, DO 06/10/17 0149

## 2017-06-10 NOTE — ED Triage Notes (Signed)
Per EMS: pt coming from home with c/o fall and possible hip injury. EMS reports pt rolled off the bed and fell between the bed and dresser with her leg folded under her, and was in that position for at least 2 hours before family found her and called EMS. Pt was given 10 mg of morphine and 1100 ml NS en route. She denies pain at this time. A+O x 4.

## 2017-06-10 NOTE — ED Notes (Signed)
Patient transported to X-ray 

## 2017-06-10 NOTE — Consult Note (Signed)
Reason for Consult:left comminuted IT hip fx , displaced, closed.  Referring Physician:  Dessa Phi MD  Sara Garcia is an 83 y.o. female.  HPI: 83 year old female fell out of bed landing on the floor suffering a left comminuted intertrochanteric hip fracture. She is a household ambulator using a walker. She has a history of atrial fibrillation currently on Eliquis . Her pain was immediate she was unable to ambulate unable to get up. She lives with her husband. Her daughters do the shopping. She normally able to cook canals. Past history positive for atrial fib arthritis asthma, history of DVT previously on Coumadin, GERD and osteoporosis.  Past Medical History:  Diagnosis Date  . A-fib ([place])   . Arthritis   . Asthma   . DVT (deep venous thrombosis) ([place]) 2011/2012   chronic coumadin, h/o blood clots on coumadin.  . Esophageal dysphagia    limited food and pill  . Esophageal reflux   . Essential hypertension, benign   . GERD (gastroesophageal reflux disease)   . Osteoporosis   . Unspecified prolapse of vaginal walls     Past Surgical History:  Procedure Laterality Date  . ABDOMINAL HYSTERECTOMY    . COLONOSCOPY W/ BIOPSIES  02/01/2004   RMR: Anal papilla with normal rectum/Sigmoid diverticula/Small polyps in the cecum removed as described above. Inflammatory polyp  . ESOPHAGEAL DILATION     unknown year per patient.  . ESOPHAGEAL DILATION N/A 02/07/2015   RMR: Normal esophagus status post maloney dilation. Small hiatal hernia.   Marland Kitchen ESOPHAGOGASTRODUODENOSCOPY     unknown year per patient  . ESOPHAGOGASTRODUODENOSCOPY N/A 02/07/2015   RMR: Normal esophagus  status post  Maloney dilation. Small hiatal hernia  . NASAL SEPTUM SURGERY    . TONSILLECTOMY    . WRIST FRACTURE SURGERY      Family History  Problem Relation Age of Onset  . Colon polyps Neg Hx   . Colon cancer Neg Hx     Social History:  reports that she has never smoked. She has never used smokeless tobacco.  She reports that she does not drink alcohol or use drugs.  Allergies:  Allergies  Allergen Reactions  . Latex Itching and Rash  . Adhesive [Tape] Other (See Comments)    Tears skin  . Ciprofloxacin Nausea And Vomiting  . Codeine Nausea And Vomiting    Upsets stomach, nightmares  . Penicillins Hives    Has patient had a PCN reaction causing immediate rash, facial/tongue/throat swelling, SOB or lightheadedness with hypotension: yes Has patient had a PCN reaction causing severe rash involving mucus membranes or skin necrosis: yes Has patient had a PCN reaction that required hospitalization: no Has patient had a PCN reaction occurring within the last 10 years: no If all of the above answers are "NO", then may proceed with Cephalosporin use.   . Sulfa Antibiotics Hives    Medications: I have reviewed the patient's current medications.  Results for orders placed or performed during the hospital encounter of 06/10/17 (from the past 48 hour(s))  Urinalysis, Routine w reflex microscopic     Status: Abnormal   Collection Time: 06/10/17  6:42 AM  Result Value Ref Range   Color, Urine STRAW (A) YELLOW   APPearance CLEAR CLEAR   Specific Gravity, Urine 1.020 1.005 - 1.030   pH 6.5 5.0 - 8.0   Glucose, UA NEGATIVE NEGATIVE mg/dL   Hgb urine dipstick NEGATIVE NEGATIVE   Bilirubin Urine NEGATIVE NEGATIVE   Ketones,  ur NEGATIVE NEGATIVE mg/dL   Protein, ur NEGATIVE NEGATIVE mg/dL   Nitrite NEGATIVE NEGATIVE   Leukocytes, UA NEGATIVE NEGATIVE    Comment: Microscopic not done on urines with negative protein, blood, leukocytes, nitrite, or glucose < 500 mg/dL.  CBC with Differential     Status: Abnormal   Collection Time: 06/10/17  6:55 AM  Result Value Ref Range   WBC 14.3 (H) 4.0 - 10.5 K/uL   RBC 4.08 3.87 - 5.11 MIL/uL   Hemoglobin 11.6 (L) 12.0 - 15.0 g/dL   HCT 35.7 (L) 36.0 - 46.0 %   MCV 87.5 78.0 - 100.0 fL   MCH 28.4 26.0 - 34.0 pg   MCHC 32.5 30.0 - 36.0 g/dL   RDW 14.4 11.5 -  15.5 %   Platelets 224 150 - 400 K/uL   Neutrophils Relative % 74 %   Neutro Abs 10.7 (H) 1.7 - 7.7 K/uL   Lymphocytes Relative 16 %   Lymphs Abs 2.2 0.7 - 4.0 K/uL   Monocytes Relative 9 %   Monocytes Absolute 1.3 (H) 0.1 - 1.0 K/uL   Eosinophils Relative 1 %   Eosinophils Absolute 0.1 0.0 - 0.7 K/uL   Basophils Relative 0 %   Basophils Absolute 0.0 0.0 - 0.1 K/uL  Basic metabolic panel     Status: Abnormal   Collection Time: 06/10/17  6:55 AM  Result Value Ref Range   Sodium 143 135 - 145 mmol/L   Potassium 4.7 3.5 - 5.1 mmol/L   Chloride 114 (H) 101 - 111 mmol/L   CO2 21 (L) 22 - 32 mmol/L   Glucose, Bld 124 (H) 65 - 99 mg/dL   BUN 26 (H) 6 - 20 mg/dL   Creatinine, Ser 0.81 0.44 - 1.00 mg/dL   Calcium 7.5 (L) 8.9 - 10.3 mg/dL   GFR calc non Af Amer >60 >60 mL/min   GFR calc Af Amer >60 >60 mL/min    Comment: (NOTE) The eGFR has been calculated using the CKD EPI equation. This calculation has not been validated in all clinical situations. eGFR's persistently <60 mL/min signify possible Chronic Kidney Disease.    Anion gap 8 5 - 15  Protime-INR     Status: Abnormal   Collection Time: 06/10/17  6:55 AM  Result Value Ref Range   Prothrombin Time 16.0 (H) 11.4 - 15.2 seconds   INR 1.27   APTT     Status: None   Collection Time: 06/10/17  6:55 AM  Result Value Ref Range   aPTT 31 24 - 36 seconds  Type and screen     Status: None   Collection Time: 06/10/17  6:55 AM  Result Value Ref Range   ABO/RH(D) O NEG    Antibody Screen NEG    Sample Expiration 06/13/2017   ABO/Rh     Status: None   Collection Time: 06/10/17  6:55 AM  Result Value Ref Range   ABO/RH(D) O NEG     Dg Chest 1 View  Result Date: 06/10/2017 CLINICAL DATA:  Left hip fracture.  Preop imaging. EXAM: CHEST 1 VIEW COMPARISON:  12/21/2016 FINDINGS: No airspace consolidation. No pneumothorax. No effusion. Mild vascular prominence, likely due to supine positioning. Unchanged hilar, mediastinal and cardiac  contours. IMPRESSION: No consolidation.  No large effusion. Electronically Signed   By: Andreas Newport M.D.   On: 06/10/2017 06:54   Dg Thoracic Spine 2 View  Result Date: 06/10/2017 CLINICAL DATA:  Presumed unwitnessed fall. Found on floor by  family this morning. EXAM: THORACIC SPINE 2 VIEWS COMPARISON:  Chest CT 09/11/2016 FINDINGS: Unchanged kyphoscoliosis. The thoracic vertebrae are normal in height. No evidence of acute thoracic spine fracture. Moderately severe thoracic degenerative disc changes, with large osteophytes at the right lateral aspect of the vertebral column. No bone lesion or bony destruction. IMPRESSION: Kyphoscoliosis and moderate degenerative changes. No evidence of acute thoracic spine fracture. Electronically Signed   By: Andreas Newport M.D.   On: 06/10/2017 06:50   Dg Lumbar Spine Complete  Result Date: 06/10/2017 CLINICAL DATA:  Presumed unwitnessed fall. Found on floor by family this morning. EXAM: LUMBAR SPINE - COMPLETE 4+ VIEW COMPARISON:  CT 09/11/2015 FINDINGS: Unchanged thoracolumbar scoliosis. No evidence of acute lumbar spine fracture. Moderately severe lumbar degenerative disc and facet changes. Sacroiliac joints are grossly intact. IMPRESSION: Thoracolumbar scoliosis. Degenerative disc and facet changes. Negative for acute lumbar spine fracture. Electronically Signed   By: Andreas Newport M.D.   On: 06/10/2017 06:51   Ct Head Wo Contrast  Result Date: 06/10/2017 CLINICAL DATA:  Head trauma, fall from bed EXAM: CT HEAD WITHOUT CONTRAST CT CERVICAL SPINE WITHOUT CONTRAST TECHNIQUE: Multidetector CT imaging of the head and cervical spine was performed following the standard protocol without intravenous contrast. Multiplanar CT image reconstructions of the cervical spine were also generated. COMPARISON:  Head CT 10/20/2013 FINDINGS: CT HEAD FINDINGS Brain: No mass lesion, intraparenchymal hemorrhage or extra-axial collection. No evidence of acute cortical infarct.  There is periventricular hypoattenuation compatible with chronic microvascular disease. Vascular: No hyperdense vessel or unexpected calcification. Skull: Normal visualized skull base, calvarium and extracranial soft tissues. Sinuses/Orbits: No sinus fluid levels or advanced mucosal thickening. No mastoid effusion. Normal orbits. CT CERVICAL SPINE FINDINGS Alignment: Reversal of the normal cervical lordosis without static subluxation. Facets are aligned. Occipital condyles are normally positioned. Skull base and vertebrae: No acute fracture. Soft tissues and spinal canal: No prevertebral fluid or swelling. No visible canal hematoma. Disc levels: No bony spinal canal stenosis. Fused left C2-C3 facets. Multilevel foraminal stenosis. Upper chest: Ground-glass opacities and septal thickening, possibly pulmonary edema. Other: Normal visualized paraspinal cervical soft tissues. IMPRESSION: 1. 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 2. No acute fracture or static subluxation of the cervical spine. 3. Biapical groundglass opacity and septal thickening, likely pulmonary edema. Correlate with chest radiograph. Electronically Signed   By: Ulyses Jarred M.D.   On: 06/10/2017 06:41   Ct Cervical Spine Wo Contrast  Result Date: 06/10/2017 CLINICAL DATA:  Head trauma, fall from bed EXAM: CT HEAD WITHOUT CONTRAST CT CERVICAL SPINE WITHOUT CONTRAST TECHNIQUE: Multidetector CT imaging of the head and cervical spine was performed following the standard protocol without intravenous contrast. Multiplanar CT image reconstructions of the cervical spine were also generated. COMPARISON:  Head CT 10/20/2013 FINDINGS: CT HEAD FINDINGS Brain: No mass lesion, intraparenchymal hemorrhage or extra-axial collection. No evidence of acute cortical infarct. There is periventricular hypoattenuation compatible with chronic microvascular disease. Vascular: No hyperdense vessel or unexpected calcification. Skull:  Normal visualized skull base, calvarium and extracranial soft tissues. Sinuses/Orbits: No sinus fluid levels or advanced mucosal thickening. No mastoid effusion. Normal orbits. CT CERVICAL SPINE FINDINGS Alignment: Reversal of the normal cervical lordosis without static subluxation. Facets are aligned. Occipital condyles are normally positioned. Skull base and vertebrae: No acute fracture. Soft tissues and spinal canal: No prevertebral fluid or swelling. No visible canal hematoma. Disc levels: No bony spinal canal stenosis. Fused left C2-C3 facets. Multilevel foraminal stenosis. Upper chest: Ground-glass opacities and septal thickening,  possibly pulmonary edema. Other: Normal visualized paraspinal cervical soft tissues. IMPRESSION: 1. 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 2. No acute fracture or static subluxation of the cervical spine. 3. Biapical groundglass opacity and septal thickening, likely pulmonary edema. Correlate with chest radiograph. Electronically Signed   By: Ulyses Jarred M.D.   On: 06/10/2017 06:41   Dg Hip Unilat W Or Wo Pelvis 2-3 Views Left  Result Date: 06/10/2017 CLINICAL DATA:  Presumed unwitnessed fall. Found on floor by family this morning EXAM: DG HIP (WITH OR WITHOUT PELVIS) 2-3V LEFT COMPARISON:  None. FINDINGS: There is an acute mildly comminuted intertrochanteric left hip fracture with varus angulation. No dislocation. Bony pelvis appears intact. Pubic symphysis and sacroiliac joints appear intact. Moderate right hip arthritis incidentally noted. IMPRESSION: Intertrochanteric left hip fracture with varus angulation. Electronically Signed   By: Andreas Newport M.D.   On: 06/10/2017 06:52    Review of Systems  Constitutional: Negative for chills and fever.  HENT: Negative for hearing loss.   Respiratory: Negative for cough.   Cardiovascular: Negative for chest pain and palpitations.       Positive for atrial fibrillation    Gastrointestinal: Positive for heartburn.  Musculoskeletal: Positive for back pain.       Significant thoracolumbar scoliosis  Endo/Heme/Allergies: Bruises/bleeds easily.  Psychiatric/Behavioral: Positive for memory loss. Negative for substance abuse and suicidal ideas.   Blood pressure 139/83, pulse (!) 142, resp. rate 16, SpO2 100 %. Physical Exam  Constitutional: She is oriented to person, place, and time. She appears well-developed and well-nourished.  Pleasant conversant. She just had some pain medication  HENT:  Head: Normocephalic.  Eyes: EOM are normal.  Neck: Normal range of motion.  Cardiovascular:  Patient is in atrial fibrillation regular rate and rhythm pulse 100  Respiratory: Effort normal. No respiratory distress. She has no wheezes.  GI: Soft.  Musculoskeletal:  And Buck's traction 5 pounds left lower extremity.  Neurological: She is alert and oriented to person, place, and time.  Skin: Skin is warm and dry.  Psychiatric: She has a normal mood and affect. Her behavior is normal.    Assessment/Plan: Plan left hip affixes trochanteric nail tomorrow Wednesday at about 6 PM. Procedure discussed with surgery discussed. Questions were elicited and answered. She thinks she just had problems with a rash when she had penicillin previously years ago.this can be confirmed with her daughter .   Marybelle Killings 06/10/2017, 8:02 PM

## 2017-06-10 NOTE — ED Notes (Signed)
Admitting nurse at bedside 

## 2017-06-10 NOTE — ED Notes (Signed)
EKG given to EDP, Ward,MD., for review. 

## 2017-06-10 NOTE — ED Provider Notes (Signed)
Discussed with Dr. Lorin Mercy, orthopedic surgery. He will see patient later this afternoon or early evening. Plans for surgery tomorrow since she is on eliquis. Requesting 5 pounds of Buck's traction. Admitted to hospitalist service.  Pt updated.    Virgel Manifold, MD 06/10/17 [phone]

## 2017-06-10 NOTE — H&P (Addendum)
History and Physical    [name]:[id number] DOB: 1928/02/03 DOA: 06/10/2017  PCP: Doree Albee, MD  Patient coming from: Home  Chief Complaint: Fall   HPI: Sara Garcia is a 83 y.o. female with medical history significant of atrial fibrillation on Eliquis, previous history of DVT, essential hypertension, osteoporosis, GERD who presents after she rolled out of bed this morning. She landed on her left hip and denies hitting her head or losing consciousness. She was not able to get off the floor until the paramedics arrived. She admits to left-sided hip pain which has improved with pain medications in the emergency department. She has no other physical complaints. She denies any recent illnesses, no fevers or chills, no chest pain or shortness of breath, no nausea, vomiting, diarrhea or abdominal pain. She lives at home with her husband, ambulates on her own and sometimes uses a cane, she is still driving, and is able to shop, cook, and care for herself at baseline.   ED Course: Imaging in the emergency department revealed intertrochanteric left hip fracture with varus angulation. Orthopedic surgery, Dr. Lorin Mercy, was consulted who recommended holding Eliquis for surgical intervention this week.  Review of Systems: As per HPI otherwise 10 point review of systems negative.   Past Medical History:  Diagnosis Date  . A-fib ([place])   . Arthritis   . Asthma   . Dementia   . DVT (deep venous thrombosis) ([place]) 2011/2012   chronic coumadin, h/o blood clots on coumadin.  . Esophageal dysphagia    limited food and pill  . Esophageal reflux   . Essential hypertension, benign   . GERD (gastroesophageal reflux disease)   . Osteoporosis   . Unspecified prolapse of vaginal walls     Past Surgical History:  Procedure Laterality Date  . ABDOMINAL HYSTERECTOMY    . COLONOSCOPY W/ BIOPSIES  02/01/2004   RMR: Anal papilla with normal rectum/Sigmoid diverticula/Small polyps in the cecum removed as  described above. Inflammatory polyp  . ESOPHAGEAL DILATION     unknown year per patient.  . ESOPHAGEAL DILATION N/A 02/07/2015   RMR: Normal esophagus status post maloney dilation. Small hiatal hernia.   [facility] ESOPHAGOGASTRODUODENOSCOPY     unknown year per patient  . ESOPHAGOGASTRODUODENOSCOPY N/A 02/07/2015   RMR: Normal esophagus  status post  Maloney dilation. Small hiatal hernia  . NASAL SEPTUM SURGERY    . TONSILLECTOMY    . WRIST FRACTURE SURGERY       reports that she has never smoked. She has never used smokeless tobacco. She reports that she does not drink alcohol or use drugs.  Allergies  Allergen Reactions  . Latex Itching and Rash  . Adhesive [Tape] Other (See Comments)    Tears skin  . Ciprofloxacin Nausea And Vomiting  . Codeine Nausea And Vomiting    Upsets stomach, nightmares  . Penicillins Hives  . Sulfa Antibiotics Hives    Family History  Problem Relation Age of Onset  . Colon polyps Neg Hx   . Colon cancer Neg Hx     Prior to Admission medications   Medication Sig Start Date End Date Taking? Authorizing Provider  acetaminophen (TYLENOL) 500 MG tablet Take 1,000 mg by mouth 2 (two) times daily as needed for mild pain, moderate pain or headache.     [provider]  apixaban (ELIQUIS) 5 MG TABS tablet Take 1 tablet (5 mg total) by mouth 2 (two) times daily. 01/14/17   Arnoldo Lenis, MD  Calcium Carb-Cholecalciferol (CALCIUM-VITAMIN D) 500-200 MG-UNIT tablet Take 1 tablet by mouth daily.    [provider]  cyanocobalamin 2000 MCG tablet Take 2,000 mcg by mouth daily.    [provider]  diltiazem (CARDIZEM) 30 MG tablet Take 1 tablet (30 mg total) by mouth 2 (two) times daily. 02/24/17   Arnoldo Lenis, MD  DULoxetine (CYMBALTA) 60 MG capsule Take 60 mg by mouth 2 (two) times daily.     [provider]  esomeprazole (NEXIUM) 20 MG capsule Take 1 capsule (20 mg total) by mouth daily at 12 noon. 11/06/16   Carlis Stable, NP    Fluticasone-Salmeterol (ADVAIR) 250-50 MCG/DOSE AEPB Inhale 1 puff into the lungs daily.     [provider]  metoprolol tartrate (LOPRESSOR) 25 MG tablet Take 1.5 tablets (37.5 mg total) by mouth 2 (two) times daily. 02/24/17   Arnoldo Lenis, MD  polyethylene glycol powder (GLYCOLAX/MIRALAX) powder MIX 17 GRAMS WITH LIQUID AND TAKE BY MOUTH ONCE DAILY 12/13/16   Mahala Menghini, PA-C  pyridOXINE (VITAMIN B-6) 100 MG tablet Take 100 mg by mouth daily.    [provider]    Physical Exam: Vitals:   06/10/17 0715 06/10/17 0730 06/10/17 0745 06/10/17 0800  BP: (!) 157/81 (!) 159/78 (!) 163/86 (!) 174/87  Pulse: 66 68 72 70  Resp: 19 (!) 23 (!) 23 20  SpO2: 97% 95% 100% 97%     Constitutional: NAD, calm, comfortable Eyes: PERRL, lids and conjunctivae normal ENMT: Mucous membranes are dry. Posterior pharynx clear of any exudate or lesions. Neck: normal, supple, no masses, no thyromegaly Respiratory: clear to auscultation bilaterally, no wheezing, no crackles. Normal respiratory effort. No accessory muscle use.  Cardiovascular: Regular rate and rhythm, no murmurs / rubs / gallops. No extremity edema. Abdomen: no tenderness, no masses palpated. No hepatosplenomegaly. Bowel sounds positive.  Musculoskeletal: no clubbing / cyanosis. No joint deformity upper and lower extremities. Pain to palpation left lower extremity  Skin: no rashes, lesions, ulcers. No induration on exposed skin  Neurologic: CN 2-12 grossly intact. Speech clear.  Psychiatric: Normal judgment and insight. Normal mood.   Labs on Admission: I have personally reviewed following labs and imaging studies  CBC:  Recent Labs Lab 06/10/17 0655  WBC 14.3*  NEUTROABS 10.7*  HGB 11.6*  HCT 35.7*  MCV 87.5  PLT 253   Basic Metabolic Panel:  Recent Labs Lab 06/10/17 0655  NA 143  K 4.7  CL 114*  CO2 21*  GLUCOSE 124*  BUN 26*  CREATININE 0.81  CALCIUM 7.5*   GFR: Estimated Creatinine  Clearance: 44.5 mL/min (by C-G formula based on SCr of 0.81 mg/dL). Liver Function Tests: No results for input(s): AST, ALT, ALKPHOS, BILITOT, PROT, ALBUMIN in the last 168 hours. No results for input(s): LIPASE, AMYLASE in the last 168 hours. No results for input(s): AMMONIA in the last 168 hours. Coagulation Profile:  Recent Labs Lab 06/10/17 0655  INR 1.27   Cardiac Enzymes: No results for input(s): CKTOTAL, CKMB, CKMBINDEX, TROPONINI in the last 168 hours. BNP (last 3 results) No results for input(s): PROBNP in the last 8760 hours. HbA1C: No results for input(s): HGBA1C in the last 72 hours. CBG: No results for input(s): GLUCAP in the last 168 hours. Lipid Profile: No results for input(s): CHOL, HDL, LDLCALC, TRIG, CHOLHDL, LDLDIRECT in the last 72 hours. Thyroid Function Tests: No results for input(s): TSH, T4TOTAL, FREET4, T3FREE, THYROIDAB in the last 72 hours. Anemia Panel: No  results for input(s): VITAMINB12, FOLATE, FERRITIN, TIBC, IRON, RETICCTPCT in the last 72 hours. Urine analysis:    Component Value Date/Time   COLORURINE STRAW (A) 06/10/2017 0642   APPEARANCEUR CLEAR 06/10/2017 0642   LABSPEC 1.020 06/10/2017 0642   PHURINE 6.5 06/10/2017 0642   GLUCOSEU NEGATIVE 06/10/2017 0642   HGBUR NEGATIVE 06/10/2017 0642   BILIRUBINUR NEGATIVE 06/10/2017 0642   KETONESUR NEGATIVE 06/10/2017 0642   PROTEINUR NEGATIVE 06/10/2017 0642   UROBILINOGEN 0.2 10/20/2013 1434   NITRITE NEGATIVE 06/10/2017 0642   LEUKOCYTESUR NEGATIVE 06/10/2017 0642   Sepsis Labs: !!!!!!!!!!!!!!!!!!!!!!!!!!!!!!!!!!!!!!!!!!!! @LABRCNTIP (procalcitonin:4,lacticidven:4) )No results found for this or any previous visit (from the past 240 hour(s)).   Radiological Exams on Admission: Dg Chest 1 View  Result Date: 06/10/2017 CLINICAL DATA:  Left hip fracture.  Preop imaging. EXAM: CHEST 1 VIEW COMPARISON:  12/21/2016 FINDINGS: No airspace consolidation. No pneumothorax. No effusion. Mild vascular  prominence, likely due to supine positioning. Unchanged hilar, mediastinal and cardiac contours. IMPRESSION: No consolidation.  No large effusion. Electronically Signed   By: Andreas Newport M.D.   On: 06/10/2017 06:54   Dg Thoracic Spine 2 View  Result Date: 06/10/2017 CLINICAL DATA:  Presumed unwitnessed fall. Found on floor by family this morning. EXAM: THORACIC SPINE 2 VIEWS COMPARISON:  Chest CT 09/11/2016 FINDINGS: Unchanged kyphoscoliosis. The thoracic vertebrae are normal in height. No evidence of acute thoracic spine fracture. Moderately severe thoracic degenerative disc changes, with large osteophytes at the right lateral aspect of the vertebral column. No bone lesion or bony destruction. IMPRESSION: Kyphoscoliosis and moderate degenerative changes. No evidence of acute thoracic spine fracture. Electronically Signed   By: Andreas Newport M.D.   On: 06/10/2017 06:50   Dg Lumbar Spine Complete  Result Date: 06/10/2017 CLINICAL DATA:  Presumed unwitnessed fall. Found on floor by family this morning. EXAM: LUMBAR SPINE - COMPLETE 4+ VIEW COMPARISON:  CT 09/11/2015 FINDINGS: Unchanged thoracolumbar scoliosis. No evidence of acute lumbar spine fracture. Moderately severe lumbar degenerative disc and facet changes. Sacroiliac joints are grossly intact. IMPRESSION: Thoracolumbar scoliosis. Degenerative disc and facet changes. Negative for acute lumbar spine fracture. Electronically Signed   By: Andreas Newport M.D.   On: 06/10/2017 06:51   Ct Head Wo Contrast  Result Date: 06/10/2017 CLINICAL DATA:  Head trauma, fall from bed EXAM: CT HEAD WITHOUT CONTRAST CT CERVICAL SPINE WITHOUT CONTRAST TECHNIQUE: Multidetector CT imaging of the head and cervical spine was performed following the standard protocol without intravenous contrast. Multiplanar CT image reconstructions of the cervical spine were also generated. COMPARISON:  Head CT 10/20/2013 FINDINGS: CT HEAD FINDINGS Brain: No mass lesion,  intraparenchymal hemorrhage or extra-axial collection. No evidence of acute cortical infarct. There is periventricular hypoattenuation compatible with chronic microvascular disease. Vascular: No hyperdense vessel or unexpected calcification. Skull: Normal visualized skull base, calvarium and extracranial soft tissues. Sinuses/Orbits: No sinus fluid levels or advanced mucosal thickening. No mastoid effusion. Normal orbits. CT CERVICAL SPINE FINDINGS Alignment: Reversal of the normal cervical lordosis without static subluxation. Facets are aligned. Occipital condyles are normally positioned. Skull base and vertebrae: No acute fracture. Soft tissues and spinal canal: No prevertebral fluid or swelling. No visible canal hematoma. Disc levels: No bony spinal canal stenosis. Fused left C2-C3 facets. Multilevel foraminal stenosis. Upper chest: Ground-glass opacities and septal thickening, possibly pulmonary edema. Other: Normal visualized paraspinal cervical soft tissues. IMPRESSION: 1. 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 2. No acute fracture or static subluxation of the cervical spine. 3. Biapical groundglass opacity and septal  thickening, likely pulmonary edema. Correlate with chest radiograph. Electronically Signed   By: Ulyses Jarred M.D.   On: 06/10/2017 06:41   Ct Cervical Spine Wo Contrast  Result Date: 06/10/2017 CLINICAL DATA:  Head trauma, fall from bed EXAM: CT HEAD WITHOUT CONTRAST CT CERVICAL SPINE WITHOUT CONTRAST TECHNIQUE: Multidetector CT imaging of the head and cervical spine was performed following the standard protocol without intravenous contrast. Multiplanar CT image reconstructions of the cervical spine were also generated. COMPARISON:  Head CT 10/20/2013 FINDINGS: CT HEAD FINDINGS Brain: No mass lesion, intraparenchymal hemorrhage or extra-axial collection. No evidence of acute cortical infarct. There is periventricular hypoattenuation compatible with  chronic microvascular disease. Vascular: No hyperdense vessel or unexpected calcification. Skull: Normal visualized skull base, calvarium and extracranial soft tissues. Sinuses/Orbits: No sinus fluid levels or advanced mucosal thickening. No mastoid effusion. Normal orbits. CT CERVICAL SPINE FINDINGS Alignment: Reversal of the normal cervical lordosis without static subluxation. Facets are aligned. Occipital condyles are normally positioned. Skull base and vertebrae: No acute fracture. Soft tissues and spinal canal: No prevertebral fluid or swelling. No visible canal hematoma. Disc levels: No bony spinal canal stenosis. Fused left C2-C3 facets. Multilevel foraminal stenosis. Upper chest: Ground-glass opacities and septal thickening, possibly pulmonary edema. Other: Normal visualized paraspinal cervical soft tissues. IMPRESSION: 1. 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 2. No acute fracture or static subluxation of the cervical spine. 3. Biapical groundglass opacity and septal thickening, likely pulmonary edema. Correlate with chest radiograph. Electronically Signed   By: Ulyses Jarred M.D.   On: 06/10/2017 06:41   Dg Hip Unilat W Or Wo Pelvis 2-3 Views Left  Result Date: 06/10/2017 CLINICAL DATA:  Presumed unwitnessed fall. Found on floor by family this morning EXAM: DG HIP (WITH OR WITHOUT PELVIS) 2-3V LEFT COMPARISON:  None. FINDINGS: There is an acute mildly comminuted intertrochanteric left hip fracture with varus angulation. No dislocation. Bony pelvis appears intact. Pubic symphysis and sacroiliac joints appear intact. Moderate right hip arthritis incidentally noted. IMPRESSION: Intertrochanteric left hip fracture with varus angulation. Electronically Signed   By: Andreas Newport M.D.   On: 06/10/2017 06:52    EKG: Independently reviewed. Normal sinus rhythm  Assessment/Plan Principal Problem:   Closed left hip fracture (HCC) Active Problems:   Essential  hypertension   Atrial fibrillation (HCC)   Left hip fracture after fall -Orthopedic surgery consultation, planning for surgical intervention later this week -Hold Eliquis -Lyndel Safe periop cardiac risk: 0.92% estimatedrisk probability for periop MI or cardiac arrest -Bedrest for now, foley to be placed -Pain control, antiemetic   Paroxysmal atrial fibrillation -CHADS2Vasc 4 -Currently in normal sinus -Hold Eliquis  -Continue Cardizem, Lopressor  HTN -Continue Cardizem, Lopressor  Depression -Continue Cymbalta  GERD -Continue PPI   DVT prophylaxis: SCD Code Status: DNR. Confirmed with patient as well as daughter Advertising account planner) at bedside  Family Communication: Family at bedside  Disposition Plan: Pending surgical course Consults called: Orthopedic surgery  Admission status: Inpatient   Severity of Illness: The appropriate patient status for this patient is INPATIENT. Inpatient status is judged to be reasonable and necessary in order to provide the required intensity of service to ensure the patient's safety. The patient's presenting symptoms, physical exam findings, and initial radiographic and laboratory data in the context of their chronic comorbidities is felt to place them at high risk for further clinical deterioration. Furthermore, it is not anticipated that the patient will be medically stable for discharge from the hospital within 2 midnights of admission. The  following factors support the patient status of inpatient.   " The patient's presenting symptoms include left hip pain after fall. " The worrisome physical exam findings include left hip pain with decreased mobility. " The initial radiographic and laboratory data are worrisome because of left intertrochanteric fracture. " The chronic co-morbidities include A Fib, HTN.   * I certify that at the point of admission it is my clinical judgment that the patient will require inpatient hospital care spanning  beyond 2 midnights from the point of admission due to high intensity of service, high risk for further deterioration and high frequency of surveillance required.*   Dessa Phi, DO [facility] [email or web address] Password [facility] 06/10/2017, 8:18 AM

## 2017-06-10 NOTE — ED Notes (Signed)
Bed: RESB Expected date:  Expected time:  Means of arrival:  Comments: 83 yr old fall, leg deformity

## 2017-06-10 NOTE — ED Notes (Signed)
1510 scheduled report time. Robert Bellow receiving RN [phone]

## 2017-06-10 NOTE — Progress Notes (Signed)
[facility]/Dr Lorin Mercy consulted with patient for surgery. Notified daughter Hilaria Ota of the scheduled time of surgery per Dr Lorin Mercy.  Daughter stated that the family requested to have [facility]/Dr Alucia consulted.  Notified Dr Lorin Mercy and Chaney Malling, NP.  [facility] consulted per [employer].  Dr Lorin Mercy notified of new consult and request for patient to be removed from his list on 06/11/17.

## 2017-06-10 NOTE — Progress Notes (Signed)
HR sustaining 130-140s since 1645. MD made aware. Verbal order to give scheduled metoprolol and cardizem. Will continue to monitor.

## 2017-06-11 ENCOUNTER — Inpatient Hospital Stay (HOSPITAL_COMMUNITY): Payer: Medicare HMO | Admitting: Certified Registered Nurse Anesthetist

## 2017-06-11 ENCOUNTER — Inpatient Hospital Stay (HOSPITAL_COMMUNITY): Payer: Medicare HMO

## 2017-06-11 ENCOUNTER — Encounter (HOSPITAL_COMMUNITY)
Admission: EM | Disposition: A | Discharge status: Skilled Nursing Facility | Payer: Self-pay | Source: Home / Self Care | Attending: Internal Medicine

## 2017-06-11 DIAGNOSIS — S72142A Displaced intertrochanteric fracture of left femur, initial encounter for closed fracture: Secondary | ICD-10-CM | POA: Diagnosis present

## 2017-06-11 DIAGNOSIS — S72002A Fracture of unspecified part of neck of left femur, initial encounter for closed fracture: Secondary | ICD-10-CM

## 2017-06-11 HISTORY — PX: FEMUR IM NAIL: SHX1597

## 2017-06-11 LAB — CBC
HCT: 35 % — ABNORMAL LOW (ref 36.0–46.0)
Hemoglobin: 11 g/dL — ABNORMAL LOW (ref 12.0–15.0)
MCH: 28.2 pg (ref 26.0–34.0)
MCHC: 31.4 g/dL (ref 30.0–36.0)
MCV: 89.7 fL (ref 78.0–100.0)
Platelets: 242 10*3/uL (ref 150–400)
RBC: 3.9 MIL/uL (ref 3.87–5.11)
RDW: 14.8 % (ref 11.5–15.5)
WBC: 12.3 10*3/uL — ABNORMAL HIGH (ref 4.0–10.5)

## 2017-06-11 LAB — BASIC METABOLIC PANEL
Anion gap: 10 (ref 5–15)
BUN: 23 mg/dL — ABNORMAL HIGH (ref 6–20)
CO2: 22 mmol/L (ref 22–32)
Calcium: 8.3 mg/dL — ABNORMAL LOW (ref 8.9–10.3)
Chloride: 107 mmol/L (ref 101–111)
Creatinine, Ser: 0.9 mg/dL (ref 0.44–1.00)
GFR calc Af Amer: >60 mL/min (ref >60)
GFR calc non Af Amer: 56 mL/min — ABNORMAL LOW (ref >60)
Glucose, Bld: 147 mg/dL — ABNORMAL HIGH (ref 65–99)
Potassium: 3.5 mmol/L (ref 3.5–5.1)
Sodium: 139 mmol/L (ref 135–145)

## 2017-06-11 LAB — GLUCOSE, CAPILLARY
Glucose-Capillary: 128 mg/dL — ABNORMAL HIGH (ref 65–99)
Glucose-Capillary: 113 mg/dL — ABNORMAL HIGH (ref 65–99)

## 2017-06-11 SURGERY — FIXATION, FRACTURE, INTERTROCHANTERIC, WITH INTRAMEDULLARY ROD
Anesthesia: Choice | Laterality: Left

## 2017-06-11 SURGERY — INSERTION, INTRAMEDULLARY ROD, FEMUR
Anesthesia: General | Site: Hip | Laterality: Left

## 2017-06-11 MED ORDER — MORPHINE SULFATE (PF) 4 MG/ML IV SOLN: 0.5000 mg | INTRAVENOUS | Status: DC | PRN

## 2017-06-11 MED ORDER — PHENYLEPHRINE 40 MCG/ML (10ML) SYRINGE FOR IV PUSH (FOR BLOOD PRESSURE SUPPORT)
PREFILLED_SYRINGE | INTRAVENOUS | Status: AC
  Filled 2017-06-11: qty 10

## 2017-06-11 MED ORDER — ONDANSETRON HCL 4 MG/2ML IJ SOLN
INTRAMUSCULAR | Status: DC | PRN
  Administered 2017-06-11: 4 mg via INTRAVENOUS

## 2017-06-11 MED ORDER — LIDOCAINE 5 % EX PTCH
1.0000 | MEDICATED_PATCH | Freq: Every day | CUTANEOUS | Status: DC
  Administered 2017-06-12 – 2017-06-17 (×7): 1 via TRANSDERMAL
  Filled 2017-06-11 (×8): qty 1

## 2017-06-11 MED ORDER — DEXAMETHASONE SODIUM PHOSPHATE 10 MG/ML IJ SOLN
INTRAMUSCULAR | Status: AC
  Filled 2017-06-11: qty 1

## 2017-06-11 MED ORDER — CHLORHEXIDINE GLUCONATE 4 % EX LIQD
60.0000 mL | Freq: Once | CUTANEOUS | Status: AC
  Administered 2017-06-11: 4 via TOPICAL
  Filled 2017-06-11: qty 60

## 2017-06-11 MED ORDER — FENTANYL CITRATE (PF) 100 MCG/2ML IJ SOLN
INTRAMUSCULAR | Status: DC | PRN
  Administered 2017-06-11 (×2): 50 ug via INTRAVENOUS

## 2017-06-11 MED ORDER — PHENOL 1.4 % MT LIQD
1.0000 | OROMUCOSAL | Status: DC | PRN
  Filled 2017-06-11: qty 177

## 2017-06-11 MED ORDER — ONDANSETRON HCL 4 MG/2ML IJ SOLN
INTRAMUSCULAR | Status: AC
  Filled 2017-06-11: qty 2

## 2017-06-11 MED ORDER — APIXABAN 2.5 MG PO TABS
2.5000 mg | ORAL_TABLET | Freq: Two times a day (BID) | ORAL | Status: DC
  Administered 2017-06-12 – 2017-06-15 (×7): 2.5 mg via ORAL
  Filled 2017-06-11 (×7): qty 1

## 2017-06-11 MED ORDER — CLINDAMYCIN PHOSPHATE 900 MG/50ML IV SOLN
900.0000 mg | INTRAVENOUS | Status: AC
  Administered 2017-06-11: 900 mg via INTRAVENOUS

## 2017-06-11 MED ORDER — ESMOLOL HCL 100 MG/10ML IV SOLN
INTRAVENOUS | Status: DC | PRN
  Administered 2017-06-11: 20 mg via INTRAVENOUS
  Administered 2017-06-11: 30 mg via INTRAVENOUS
  Administered 2017-06-11 (×3): 20 mg via INTRAVENOUS
  Administered 2017-06-11: 30 mg via INTRAVENOUS

## 2017-06-11 MED ORDER — METOCLOPRAMIDE HCL 5 MG/ML IJ SOLN: 5.0000 mg | Freq: Three times a day (TID) | INTRAMUSCULAR | Status: DC | PRN

## 2017-06-11 MED ORDER — SENNA 8.6 MG PO TABS
1.0000 | ORAL_TABLET | Freq: Two times a day (BID) | ORAL | Status: DC
  Administered 2017-06-11 – 2017-06-12 (×3): 8.6 mg via ORAL
  Filled 2017-06-11 (×3): qty 1

## 2017-06-11 MED ORDER — METHOCARBAMOL 1000 MG/10ML IJ SOLN
500.0000 mg | Freq: Four times a day (QID) | INTRAMUSCULAR | Status: DC | PRN
  Administered 2017-06-11: 500 mg via INTRAVENOUS
  Filled 2017-06-11: qty 550

## 2017-06-11 MED ORDER — SODIUM CHLORIDE 0.9 % IR SOLN
Status: DC | PRN
  Administered 2017-06-11: 1000 mL

## 2017-06-11 MED ORDER — SUCCINYLCHOLINE CHLORIDE 200 MG/10ML IV SOSY
PREFILLED_SYRINGE | INTRAVENOUS | Status: AC
  Filled 2017-06-11: qty 10

## 2017-06-11 MED ORDER — MENTHOL 3 MG MT LOZG
1.0000 | LOZENGE | OROMUCOSAL | Status: DC | PRN
  Filled 2017-06-11: qty 9

## 2017-06-11 MED ORDER — DOCUSATE SODIUM 100 MG PO CAPS
100.0000 mg | ORAL_CAPSULE | Freq: Two times a day (BID) | ORAL | Status: DC
  Administered 2017-06-11 – 2017-06-12 (×3): 100 mg via ORAL
  Filled 2017-06-11 (×4): qty 1

## 2017-06-11 MED ORDER — SUCCINYLCHOLINE CHLORIDE 20 MG/ML IJ SOLN
INTRAMUSCULAR | Status: DC | PRN
Start: 2017-06-11 — End: 2017-06-11
  Administered 2017-06-11: 80 mg via INTRAVENOUS

## 2017-06-11 MED ORDER — HYDROCODONE-ACETAMINOPHEN 5-325 MG PO TABS
1.0000 | ORAL_TABLET | Freq: Four times a day (QID) | ORAL | Status: DC | PRN
  Administered 2017-06-12 (×2): 1 via ORAL
  Administered 2017-06-13: 2 via ORAL
  Administered 2017-06-13: 1 via ORAL
  Administered 2017-06-13: 2 via ORAL
  Administered 2017-06-14: 1 via ORAL
  Administered 2017-06-14 – 2017-06-15 (×3): 2 via ORAL
  Administered 2017-06-15: 1 via ORAL
  Administered 2017-06-15: 2 via ORAL
  Administered 2017-06-16: 1 via ORAL
  Administered 2017-06-16: 2 via ORAL
  Administered 2017-06-16 – 2017-06-17 (×3): 1 via ORAL
  Administered 2017-06-18: 2 via ORAL
  Administered 2017-06-18: 1 via ORAL
  Filled 2017-06-11: qty 2
  Filled 2017-06-11 (×4): qty 1
  Filled 2017-06-11 (×2): qty 2
  Filled 2017-06-11: qty 1
  Filled 2017-06-11: qty 2
  Filled 2017-06-11: qty 1
  Filled 2017-06-11: qty 2
  Filled 2017-06-11 (×2): qty 1
  Filled 2017-06-11: qty 2
  Filled 2017-06-11: qty 1
  Filled 2017-06-11 (×3): qty 2

## 2017-06-11 MED ORDER — CLINDAMYCIN PHOSPHATE 600 MG/50ML IV SOLN
600.0000 mg | Freq: Four times a day (QID) | INTRAVENOUS | Status: AC
  Administered 2017-06-11 – 2017-06-12 (×2): 600 mg via INTRAVENOUS
  Filled 2017-06-11 (×2): qty 50

## 2017-06-11 MED ORDER — MUPIROCIN 2 % EX OINT
1.0000 "application " | TOPICAL_OINTMENT | Freq: Two times a day (BID) | CUTANEOUS | Status: DC
  Administered 2017-06-11 – 2017-06-12 (×2): 1 via NASAL
  Filled 2017-06-11: qty 22

## 2017-06-11 MED ORDER — LIDOCAINE HCL (CARDIAC) 20 MG/ML IV SOLN
INTRAVENOUS | Status: DC | PRN
  Administered 2017-06-11: 60 mg via INTRAVENOUS

## 2017-06-11 MED ORDER — LACTATED RINGERS IV SOLN
INTRAVENOUS | Status: DC | PRN
  Administered 2017-06-11 (×2): via INTRAVENOUS

## 2017-06-11 MED ORDER — HYDROMORPHONE HCL-NACL 0.5-0.9 MG/ML-% IV SOSY
PREFILLED_SYRINGE | INTRAVENOUS | Status: AC
  Filled 2017-06-11: qty 1

## 2017-06-11 MED ORDER — SODIUM CHLORIDE 0.9 % IV SOLN
INTRAVENOUS | Status: DC
  Administered 2017-06-11 – 2017-06-15 (×3): via INTRAVENOUS

## 2017-06-11 MED ORDER — DEXAMETHASONE SODIUM PHOSPHATE 4 MG/ML IJ SOLN
INTRAMUSCULAR | Status: DC | PRN
  Administered 2017-06-11: 5 mg via INTRAVENOUS

## 2017-06-11 MED ORDER — HYDROMORPHONE HCL-NACL 0.5-0.9 MG/ML-% IV SOSY
0.2500 mg | PREFILLED_SYRINGE | INTRAVENOUS | Status: DC | PRN
  Administered 2017-06-11 (×2): 0.25 mg via INTRAVENOUS

## 2017-06-11 MED ORDER — CHLORHEXIDINE GLUCONATE CLOTH 2 % EX PADS
6.0000 | MEDICATED_PAD | Freq: Every day | CUTANEOUS | Status: DC
  Administered 2017-06-11: 6 via TOPICAL

## 2017-06-11 MED ORDER — PROPOFOL 10 MG/ML IV BOLUS
INTRAVENOUS | Status: AC
  Filled 2017-06-11: qty 20

## 2017-06-11 MED ORDER — PHENYLEPHRINE HCL 10 MG/ML IJ SOLN
INTRAMUSCULAR | Status: DC | PRN
  Administered 2017-06-11 (×7): 80 ug via INTRAVENOUS

## 2017-06-11 MED ORDER — FENTANYL CITRATE (PF) 100 MCG/2ML IJ SOLN
INTRAMUSCULAR | Status: AC
  Filled 2017-06-11: qty 2

## 2017-06-11 MED ORDER — PROMETHAZINE HCL 25 MG/ML IJ SOLN: 6.2500 mg | INTRAMUSCULAR | Status: DC | PRN

## 2017-06-11 MED ORDER — METHOCARBAMOL 500 MG PO TABS
500.0000 mg | ORAL_TABLET | Freq: Four times a day (QID) | ORAL | Status: DC | PRN
  Administered 2017-06-12 – 2017-06-18 (×11): 500 mg via ORAL
  Filled 2017-06-11 (×11): qty 1

## 2017-06-11 MED ORDER — PROPOFOL 10 MG/ML IV BOLUS
INTRAVENOUS | Status: DC | PRN
  Administered 2017-06-11: 90 mg via INTRAVENOUS

## 2017-06-11 MED ORDER — LIDOCAINE 2% (20 MG/ML) 5 ML SYRINGE
INTRAMUSCULAR | Status: AC
  Filled 2017-06-11: qty 5

## 2017-06-11 MED ORDER — ISOPROPYL ALCOHOL 70 % SOLN
Status: AC
  Filled 2017-06-11: qty 480

## 2017-06-11 MED ORDER — POVIDONE-IODINE 10 % EX SWAB: 2.0000 "application " | Freq: Once | CUTANEOUS | Status: DC

## 2017-06-11 MED ORDER — METOCLOPRAMIDE HCL 5 MG PO TABS
5.0000 mg | ORAL_TABLET | Freq: Three times a day (TID) | ORAL | Status: DC | PRN
  Filled 2017-06-11: qty 2

## 2017-06-11 MED ORDER — CLINDAMYCIN PHOSPHATE 900 MG/50ML IV SOLN
INTRAVENOUS | Status: AC
  Filled 2017-06-11: qty 50

## 2017-06-11 SURGICAL SUPPLY — 44 items
ADH SKN CLS APL DERMABOND .7 (GAUZE/BANDAGES/DRESSINGS) ×1
BAG SPEC THK2 15X12 ZIP CLS (MISCELLANEOUS)
BAG ZIPLOCK 12X15 (MISCELLANEOUS) IMPLANT
BIT DRILL 4.3MMS DISTAL GRDTED (BIT) IMPLANT
CHLORAPREP W/TINT 26ML (MISCELLANEOUS) ×2 IMPLANT
COVER PERINEAL POST (MISCELLANEOUS) ×2 IMPLANT
COVER SURGICAL LIGHT HANDLE (MISCELLANEOUS) ×2 IMPLANT
DERMABOND ADVANCED (GAUZE/BANDAGES/DRESSINGS) ×1
DERMABOND ADVANCED .7 DNX12 (GAUZE/BANDAGES/DRESSINGS) ×2 IMPLANT
DRAPE C-ARM 42X120 X-RAY (DRAPES) ×2 IMPLANT
DRAPE C-ARMOR (DRAPES) ×2 IMPLANT
DRAPE ORTHO SPLIT 77X108 STRL (DRAPES)
DRAPE STERI IOBAN 125X83 (DRAPES) ×1 IMPLANT
DRAPE SURG ORHT 6 SPLT 77X108 (DRAPES) IMPLANT
DRAPE U-SHAPE 47X51 STRL (DRAPES) ×4 IMPLANT
DRILL 4.3MMS DISTAL GRADUATED (BIT) ×2
DRSG MEPILEX BORDER 4X4 (GAUZE/BANDAGES/DRESSINGS) ×4 IMPLANT
ELECT BLADE TIP CTD 4 INCH (ELECTRODE) ×2 IMPLANT
FACESHIELD WRAPAROUND (MASK) ×2 IMPLANT
FACESHIELD WRAPAROUND OR TEAM (MASK) ×1 IMPLANT
GAUZE SPONGE 4X4 12PLY STRL (GAUZE/BANDAGES/DRESSINGS) ×1 IMPLANT
GLOVE BIO SURGEON STRL SZ8.5 (GLOVE) ×3 IMPLANT
GLOVE BIOGEL PI IND STRL 8.5 (GLOVE) ×1 IMPLANT
GLOVE BIOGEL PI INDICATOR 8.5 (GLOVE)
GOWN SPEC L3 XXLG W/TWL (GOWN DISPOSABLE) ×3 IMPLANT
GUIDEPIN 3.2X17.5 THRD DISP (PIN) ×1 IMPLANT
GUIDEWIRE BALL NOSE 100CM (WIRE) ×1 IMPLANT
HFN LAG SCREW 10.5MM X 85MM (Screw) ×1 IMPLANT
HFN LH 130 DEG 11MM X 380MM (Orthopedic Implant) ×1 IMPLANT
KIT BASIN OR (CUSTOM PROCEDURE TRAY) ×2 IMPLANT
MANIFOLD NEPTUNE II (INSTRUMENTS) ×2 IMPLANT
MARKER SKIN DUAL TIP RULER LAB (MISCELLANEOUS) ×2 IMPLANT
PACK TOTAL JOINT (CUSTOM PROCEDURE TRAY) ×2 IMPLANT
SCREW BONE CORTICAL 5.0X3 (Screw) ×1 IMPLANT
SCREW BONE CORTICAL 5.0X38 (Screw) ×1 IMPLANT
SUT MNCRL AB 3-0 PS2 18 (SUTURE) ×3 IMPLANT
SUT MON AB 2-0 CT1 36 (SUTURE) ×2 IMPLANT
SUT VIC AB 1 CT1 27 (SUTURE) ×4
SUT VIC AB 1 CT1 27XBRD ANTBC (SUTURE) ×2 IMPLANT
SUT VIC AB 2-0 CT1 27 (SUTURE) ×4
SUT VIC AB 2-0 CT1 27XBRD (SUTURE) ×2 IMPLANT
SYRINGE 20CC LL (MISCELLANEOUS) ×1 IMPLANT
TRAY FOLEY BAG SILVER LF 16FR (CATHETERS) ×1 IMPLANT
YANKAUER SUCT BULB TIP NO VENT (SUCTIONS) ×1 IMPLANT

## 2017-06-11 NOTE — Progress Notes (Signed)
Patient ID: Sara Garcia, female   DOB: 04-15-1928, 83 y.o.   MRN: [id number] Daughter call nurse and requested [place] after consult had been done. They were call and will see patient today. I will sign off.

## 2017-06-11 NOTE — Discharge Instructions (Signed)
Dr. Rod Can Adult Hip & Knee Specialist [facility] [address] [place], [place] [address] [phone]   POSTOPERATIVE DIRECTIONS    Hip Rehabilitation, Guidelines Following Surgery   WEIGHT BEARING Partial weight bearing with assist device as directed.  touch down wieght bearing (30%) left leg   HOME CARE INSTRUCTIONS  Remove items at home which could result in a fall. This includes throw rugs or furniture in walking pathways.  Continue medications as instructed at time of discharge.  You may have some home medications which will be placed on hold until you complete the course of blood thinner medication.  4 days after discharge, you may start showering. No tub baths or soaking your incisions. Do not put on socks or shoes without following the instructions of your caregivers.   Sit on chairs with arms. Use the chair arms to help push yourself up when arising.  Arrange for the use of a toilet seat elevator so you are not sitting low.   Walk with walker as instructed.  You may resume a sexual relationship in one month or when given the OK by your caregiver.  Use walker as long as suggested by your caregivers.  Avoid periods of inactivity such as sitting longer than an hour when not asleep. This helps prevent blood clots.  You may return to work once you are cleared by Engineer, production.  Do not drive a car for 6 weeks or until released by your surgeon.  Do not drive while taking narcotics.  Wear elastic stockings for two weeks following surgery during the day but you may remove then at night.  Make sure you keep all of your appointments after your operation with all of your doctors and caregivers. You should call the office at the above phone number and make an appointment for approximately two weeks after the date of your surgery. Please pick up a stool softener and laxative for home use as long as you are requiring pain medications.  ICE to the  affected hip every three hours for 30 minutes at a time and then as needed for pain and swelling. Continue to use ice on the hip for pain and swelling from surgery. You may notice swelling that will progress down to the foot and ankle.  This is normal after surgery.  Elevate the leg when you are not up walking on it.   It is important for you to complete the blood thinner medication as prescribed by your doctor.  Continue to use the breathing machine which will help keep your temperature down.  It is common for your temperature to cycle up and down following surgery, especially at night when you are not up moving around and exerting yourself.  The breathing machine keeps your lungs expanded and your temperature down.  RANGE OF MOTION AND STRENGTHENING EXERCISES  These exercises are designed to help you keep full movement of your hip joint. Follow your caregiver's or physical therapist's instructions. Perform all exercises about fifteen times, three times per day or as directed. Exercise both hips, even if you have had only one joint replacement. These exercises can be done on a training (exercise) mat, on the floor, on a table or on a bed. Use whatever works the best and is most comfortable for you. Use music or television while you are exercising so that the exercises are a pleasant break in your day. This will make your life better with the exercises acting as a break in  routine you can look forward to.  Lying on your back, slowly slide your foot toward your buttocks, raising your knee up off the floor. Then slowly slide your foot back down until your leg is straight again.  Lying on your back spread your legs as far apart as you can without causing discomfort.  Lying on your side, raise your upper leg and foot straight up from the floor as far as is comfortable. Slowly lower the leg and repeat.  Lying on your back, tighten up the muscle in the front of your thigh (quadriceps muscles). You can do this by  keeping your leg straight and trying to raise your heel off the floor. This helps strengthen the largest muscle supporting your knee.  Lying on your back, tighten up the muscles of your buttocks both with the legs straight and with the knee bent at a comfortable angle while keeping your heel on the floor.   SKILLED REHAB INSTRUCTIONS: If the patient is transferred to a skilled rehab facility following release from the hospital, a list of the current medications will be sent to the facility for the patient to continue.  When discharged from the skilled rehab facility, please have the facility set up the patient's [place] prior to being released. Also, the skilled facility will be responsible for providing the patient with their medications at time of release from the facility to include their pain medication and their blood thinner medication. If the patient is still at the rehab facility at time of the two week follow up appointment, the skilled rehab facility will also need to assist the patient in arranging follow up appointment in our office and any transportation needs.  MAKE SURE YOU:  Understand these instructions.  Will watch your condition.  Will get help right away if you are not doing well or get worse.  Pick up stool softner and laxative for home use following surgery while on pain medications. Daily dry dressing changes as needed. In 4 days, you may remove your dressings and begin taking showers - no tub baths or soaking the incisions. Continue to use ice for pain and swelling after surgery. Do not use any lotions or creams on the incision until instructed by your surgeon.

## 2017-06-11 NOTE — Progress Notes (Signed)
Pt left unit for surgery. Gave report to [place]. Reported pt betadine allergy per family request.Aware of betadine allergy.

## 2017-06-11 NOTE — Transfer of Care (Signed)
Immediate Anesthesia Transfer of Care Note  Patient: Sara Garcia  Procedure(s) Performed: Procedure(s): INTRAMEDULLARY (IM) NAIL FEMORAL (Left)  Patient Location: PACU  Anesthesia Type:General  Level of Consciousness:  sedated, patient cooperative and responds to stimulation  Airway & Oxygen Therapy:Patient Spontanous Breathing and Patient connected to face mask oxgen  Post-op Assessment:  Report given to PACU RN and Post -op Vital signs reviewed and stable  Post vital signs:  Reviewed and stable  Last Vitals:  Vitals:   06/11/17 1027 06/11/17 1349  BP:  131/86  Pulse:  (!) 123  Resp:  18  Temp: 36.5 C 36.8 C  SpO2:  37%    Complications: No apparent anesthesia complications

## 2017-06-11 NOTE — Anesthesia Preprocedure Evaluation (Signed)
Anesthesia Evaluation  Patient identified by MRN, date of birth, ID band Patient awake    Reviewed: Allergy & Precautions, NPO status , Patient's Chart, lab work & pertinent test results  Airway Mallampati: II  TM Distance: >3 FB Neck ROM: Full    Dental no notable dental hx.    Pulmonary neg pulmonary ROS,    Pulmonary exam normal breath sounds clear to auscultation       Cardiovascular hypertension, + DVT  + dysrhythmias Atrial Fibrillation  Rhythm:Irregular Rate:Tachycardia     Neuro/Psych negative neurological ROS  negative psych ROS   GI/Hepatic Neg liver ROS, GERD  ,  Endo/Other  negative endocrine ROS  Renal/GU negative Renal ROS  negative genitourinary   Musculoskeletal negative musculoskeletal ROS (+)   Abdominal   Peds negative pediatric ROS (+)  Hematology negative hematology ROS (+)   Anesthesia Other Findings   Reproductive/Obstetrics negative OB ROS                             Anesthesia Physical Anesthesia Plan  ASA: III  Anesthesia Plan: General   Post-op Pain Management:    Induction: Intravenous  PONV Risk Score and Plan: 2 and Ondansetron and Dexamethasone  Airway Management Planned: Oral ETT  Additional Equipment:   Intra-op Plan:   Post-operative Plan: Extubation in OR  Informed Consent: I have reviewed the patients History and Physical, chart, labs and discussed the procedure including the risks, benefits and alternatives for the proposed anesthesia with the patient or authorized representative who has indicated his/her understanding and acceptance.   Dental advisory given  Plan Discussed with: CRNA and Surgeon  Anesthesia Plan Comments:         Anesthesia Quick Evaluation

## 2017-06-11 NOTE — Anesthesia Procedure Notes (Signed)
Procedure Name: Intubation Date/Time: 06/11/2017 6:00 PM Performed by: Claudia Desanctis Pre-anesthesia Checklist: Patient identified, Emergency Drugs available, Suction available and Patient being monitored Patient Re-evaluated:Patient Re-evaluated prior to induction Oxygen Delivery Method: Circle system utilized Preoxygenation: Pre-oxygenation with 100% oxygen Induction Type: IV induction Ventilation: Mask ventilation without difficulty Laryngoscope Size: 2 and Miller Grade View: Grade I Tube type: Oral Tube size: 7.0 mm Number of attempts: 1 Airway Equipment and Method: Stylet Placement Confirmation: ETT inserted through vocal cords under direct vision,  positive ETCO2 and breath sounds checked- equal and bilateral Secured at: 21 cm Tube secured with: Tape Dental Injury: Teeth and Oropharynx as per pre-operative assessment

## 2017-06-11 NOTE — Progress Notes (Signed)
Patient requested [place]. Consult acknowledged. Plan for surgery this afternoon/evening. Full consult note to follow. Continue NPO, hold Eliquis.

## 2017-06-11 NOTE — Progress Notes (Signed)
Patient ID: Sara Garcia, female   DOB: [birth year], 83 y.o.   MRN: [id number]    PROGRESS NOTE    WALDINE ZENZ  VZC:[id number] DOB: [birth year] DOA: 06/10/2017  PCP: Doree Albee, MD   Brief Narrative:  83 y.o. female with medical history significant of atrial fibrillation on Eliquis, previous history of DVT, essential hypertension, osteoporosis, GERD who presented after she rolled out of bed the morning of the admission. She landed on her left hip and denids hitting her head or losing consciousness. She was not able to get off the floor until the paramedics arrived.   ED Course: Imaging in the emergency department revealed intertrochanteric left hip fracture with varus angulation. Orthopedic surgery consulted. Eliquis has been on hold.   Assessment & Plan:   Left hip fracture after fall - still with pain - ortho team following - plan to take to OR tonight - hold Eliquis - provide analgesia as needed   Leukocytosis - reactive - trending down - CBC in AM  Paroxysmal atrial fibrillation - CHADS2Vasc 4 - currently in NSR - rate controlled, continue Cardizem and Metoprolol   HTN - keep metoprolol and cardizem   Depression - Continue Cymbalta  GERD - Continue PPI  DVT prophylaxis: SCD's Code Status: DNR Family Communication: Patient at bedside  Disposition Plan: to be determined   Consultants:   Ortho  Procedures:   None  Antimicrobials:   Pre op   Subjective: Pt reports pain in left hip area.   Objective: Vitals:   06/10/17 2035 06/11/17 0618 06/11/17 0821 06/11/17 1027  BP:  (!) 148/88    Pulse:  (!) 120    Resp:  20 18   Temp:  98.3 F (36.8 C)  97.7 F (36.5 C)  TempSrc:  Oral  Oral  SpO2: 93% 93% 96%     Intake/Output Summary (Last 24 hours) at 06/11/17 1311 Last data filed at 06/10/17 1405  Gross per 24 hour  Intake                0 ml  Output              975 ml  Net             -975 ml   There were no vitals filed for this  visit.  Examination:  General exam: Appears calm and comfortable  Respiratory system: Respiratory effort normal. Cardiovascular system: Regular rhythm. No JVD, rubs, gallops or clicks.  Gastrointestinal system: Abdomen is nondistended, soft and nontender. No organomegaly or masses felt. Central nervous system: Alert and oriented. Extremities: TTP in the left hip area   Data Reviewed: I have personally reviewed following labs and imaging studies  CBC:  Recent Labs Lab 06/10/17 0655 06/11/17 0402  WBC 14.3* 12.3*  NEUTROABS 10.7*  --   HGB 11.6* 11.0*  HCT 35.7* 35.0*  MCV 87.5 89.7  PLT 224 128   Basic Metabolic Panel:  Recent Labs Lab 06/10/17 0655 06/11/17 0402  NA 143 139  K 4.7 3.5  CL 114* 107  CO2 21* 22  GLUCOSE 124* 147*  BUN 26* 23*  CREATININE 0.81 0.90  CALCIUM 7.5* 8.3*   Coagulation Profile:  Recent Labs Lab 06/10/17 0655  INR 1.27   CBG:  Recent Labs Lab 06/11/17 0644 06/11/17 1117  GLUCAP 128* 113*   Urine analysis:    Component Value Date/Time   COLORURINE STRAW (A) 06/10/2017 0642   APPEARANCEUR CLEAR 06/10/2017 7867  LABSPEC 1.020 06/10/2017 0642   PHURINE 6.5 06/10/2017 0642   GLUCOSEU NEGATIVE 06/10/2017 0642   HGBUR NEGATIVE 06/10/2017 0642   BILIRUBINUR NEGATIVE 06/10/2017 0642   KETONESUR NEGATIVE 06/10/2017 0642   PROTEINUR NEGATIVE 06/10/2017 0642   UROBILINOGEN 0.2 10/20/2013 1434   NITRITE NEGATIVE 06/10/2017 0642   LEUKOCYTESUR NEGATIVE 06/10/2017 5885   Recent Results (from the past 240 hour(s))  Surgical PCR screen     Status: Abnormal   Collection Time: 06/10/17  4:33 PM  Result Value Ref Range Status   MRSA, PCR NEGATIVE NEGATIVE Final   Staphylococcus aureus POSITIVE (A) NEGATIVE Final    Comment: (NOTE) The Xpert SA Assay (FDA approved for NASAL specimens in patients 81 years of age and older), is one component of a comprehensive surveillance program. It is not intended to diagnose infection nor  to guide or monitor treatment.     Radiology Studies: Dg Chest 1 View  Result Date: 06/10/2017 CLINICAL DATA:  Left hip fracture.  Preop imaging. EXAM: CHEST 1 VIEW COMPARISON:  12/21/2016 FINDINGS: No airspace consolidation. No pneumothorax. No effusion. Mild vascular prominence, likely due to supine positioning. Unchanged hilar, mediastinal and cardiac contours. IMPRESSION: No consolidation.  No large effusion. Electronically Signed   By: Andreas Newport M.D.   On: 06/10/2017 06:54   Dg Thoracic Spine 2 View  Result Date: 06/10/2017 CLINICAL DATA:  Presumed unwitnessed fall. Found on floor by family this morning. EXAM: THORACIC SPINE 2 VIEWS COMPARISON:  Chest CT 09/11/2016 FINDINGS: Unchanged kyphoscoliosis. The thoracic vertebrae are normal in height. No evidence of acute thoracic spine fracture. Moderately severe thoracic degenerative disc changes, with large osteophytes at the right lateral aspect of the vertebral column. No bone lesion or bony destruction. IMPRESSION: Kyphoscoliosis and moderate degenerative changes. No evidence of acute thoracic spine fracture. Electronically Signed   By: Andreas Newport M.D.   On: 06/10/2017 06:50   Dg Lumbar Spine Complete  Result Date: 06/10/2017 CLINICAL DATA:  Presumed unwitnessed fall. Found on floor by family this morning. EXAM: LUMBAR SPINE - COMPLETE 4+ VIEW COMPARISON:  CT 09/11/2015 FINDINGS: Unchanged thoracolumbar scoliosis. No evidence of acute lumbar spine fracture. Moderately severe lumbar degenerative disc and facet changes. Sacroiliac joints are grossly intact. IMPRESSION: Thoracolumbar scoliosis. Degenerative disc and facet changes. Negative for acute lumbar spine fracture. Electronically Signed   By: Andreas Newport M.D.   On: 06/10/2017 06:51   Ct Head Wo Contrast  Result Date: 06/10/2017 CLINICAL DATA:  Head trauma, fall from bed EXAM: CT HEAD WITHOUT CONTRAST CT CERVICAL SPINE WITHOUT CONTRAST TECHNIQUE: Multidetector CT  imaging of the head and cervical spine was performed following the standard protocol without intravenous contrast. Multiplanar CT image reconstructions of the cervical spine were also generated. COMPARISON:  Head CT 10/20/2013 FINDINGS: CT HEAD FINDINGS Brain: No mass lesion, intraparenchymal hemorrhage or extra-axial collection. No evidence of acute cortical infarct. There is periventricular hypoattenuation compatible with chronic microvascular disease. Vascular: No hyperdense vessel or unexpected calcification. Skull: Normal visualized skull base, calvarium and extracranial soft tissues. Sinuses/Orbits: No sinus fluid levels or advanced mucosal thickening. No mastoid effusion. Normal orbits. CT CERVICAL SPINE FINDINGS Alignment: Reversal of the normal cervical lordosis without static subluxation. Facets are aligned. Occipital condyles are normally positioned. Skull base and vertebrae: No acute fracture. Soft tissues and spinal canal: No prevertebral fluid or swelling. No visible canal hematoma. Disc levels: No bony spinal canal stenosis. Fused left C2-C3 facets. Multilevel foraminal stenosis. Upper chest: Ground-glass opacities and septal thickening, possibly pulmonary  edema. Other: Normal visualized paraspinal cervical soft tissues. IMPRESSION: 1. 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 2. No acute fracture or static subluxation of the cervical spine. 3. Biapical groundglass opacity and septal thickening, likely pulmonary edema. Correlate with chest radiograph. Electronically Signed   By: Ulyses Jarred M.D.   On: 06/10/2017 06:41   Ct Cervical Spine Wo Contrast  Result Date: 06/10/2017 CLINICAL DATA:  Head trauma, fall from bed EXAM: CT HEAD WITHOUT CONTRAST CT CERVICAL SPINE WITHOUT CONTRAST TECHNIQUE: Multidetector CT imaging of the head and cervical spine was performed following the standard protocol without intravenous contrast. Multiplanar CT image reconstructions of  the cervical spine were also generated. COMPARISON:  Head CT 10/20/2013 FINDINGS: CT HEAD FINDINGS Brain: No mass lesion, intraparenchymal hemorrhage or extra-axial collection. No evidence of acute cortical infarct. There is periventricular hypoattenuation compatible with chronic microvascular disease. Vascular: No hyperdense vessel or unexpected calcification. Skull: Normal visualized skull base, calvarium and extracranial soft tissues. Sinuses/Orbits: No sinus fluid levels or advanced mucosal thickening. No mastoid effusion. Normal orbits. CT CERVICAL SPINE FINDINGS Alignment: Reversal of the normal cervical lordosis without static subluxation. Facets are aligned. Occipital condyles are normally positioned. Skull base and vertebrae: No acute fracture. Soft tissues and spinal canal: No prevertebral fluid or swelling. No visible canal hematoma. Disc levels: No bony spinal canal stenosis. Fused left C2-C3 facets. Multilevel foraminal stenosis. Upper chest: Ground-glass opacities and septal thickening, possibly pulmonary edema. Other: Normal visualized paraspinal cervical soft tissues. IMPRESSION: 1. 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 2. No acute fracture or static subluxation of the cervical spine. 3. Biapical groundglass opacity and septal thickening, likely pulmonary edema. Correlate with chest radiograph. Electronically Signed   By: Ulyses Jarred M.D.   On: 06/10/2017 06:41   Dg Hip Unilat W Or Wo Pelvis 2-3 Views Left  Result Date: 06/10/2017 CLINICAL DATA:  Presumed unwitnessed fall. Found on floor by family this morning EXAM: DG HIP (WITH OR WITHOUT PELVIS) 2-3V LEFT COMPARISON:  None. FINDINGS: There is an acute mildly comminuted intertrochanteric left hip fracture with varus angulation. No dislocation. Bony pelvis appears intact. Pubic symphysis and sacroiliac joints appear intact. Moderate right hip arthritis incidentally noted. IMPRESSION: Intertrochanteric left  hip fracture with varus angulation. Electronically Signed   By: Andreas Newport M.D.   On: 06/10/2017 06:52    Scheduled Meds . chlorhexidine  60 mL Topical Once  . Chlorhexidine Gluconate Cloth  6 each Topical Daily  . diltiazem  30 mg Oral BID  . DULoxetine  60 mg Oral BID  . metoprolol tartrate  37.5 mg Oral BID  . mometasone-formoterol  2 puff Inhalation BID  . mupirocin ointment  1 application Nasal BID  . pantoprazole  40 mg Oral Daily  . povidone-iodine  2 application Topical Once  . sodium chloride flush  3 mL Intravenous Q12H   Continuous Infusions: . sodium chloride    . [START ON 06/12/2017] clindamycin (CLEOCIN) IV       LOS: 1 day   Time spent: 25 minutes   Faye Ramsay, MD [facility] Pager [phone]  If 7PM-7AM, please contact night-coverage [email or web address] Password [email or web address] 06/11/2017, 1:11 PM

## 2017-06-11 NOTE — Anesthesia Postprocedure Evaluation (Signed)
Anesthesia Post Note  Patient: Sara Garcia  Procedure(s) Performed: Procedure(s) (LRB): INTRAMEDULLARY (IM) NAIL FEMORAL (Left)     Patient location during evaluation: PACU Anesthesia Type: General Level of consciousness: awake and alert Pain management: pain level controlled Vital Signs Assessment: post-procedure vital signs reviewed and stable Respiratory status: spontaneous breathing, nonlabored ventilation and respiratory function stable Cardiovascular status: blood pressure returned to baseline and stable Postop Assessment: no signs of nausea or vomiting Anesthetic complications: no    Last Vitals:  Vitals:   06/11/17 2036 06/11/17 2038  BP: (!) 157/107 (!) 143/82  Pulse: 91 92  Resp: 17 15  Temp:    SpO2: 100% 100%    Last Pain:  Vitals:   06/11/17 1459  TempSrc:   PainSc: Asleep                 Catalina Gravel

## 2017-06-11 NOTE — Consult Note (Signed)
ORTHOPAEDIC CONSULTATION  REQUESTING PHYSICIAN: Theodis Blaze, MD  PCP:  Doree Albee, MD  Chief Complaint: left hip injury  HPI: Sara Garcia is a 83 y.o. female who complains of  Left hip pain after a ground-level fall yesterday.She had hip pain and inability to weight-bear. She was taken to the emergency department at [facility], where x-rays revealed a reverse obliquity left intertrochanteric femur fracture.The patient was seen by Dr. Shanon Payor then the family requested [facility] orthopedics. She was admitted to the hospitalist and underwent perioperative risk stratification and medical optimization. She does take Eliquis for A. Fib. She denies other injuries.  Past Medical History:  Diagnosis Date  . A-fib ([place])   . Arthritis   . Asthma   . DVT (deep venous thrombosis) ([place]) 2011/2012   chronic coumadin, h/o blood clots on coumadin.  . Esophageal dysphagia    limited food and pill  . Esophageal reflux   . Essential hypertension, benign   . GERD (gastroesophageal reflux disease)   . Osteoporosis   . Unspecified prolapse of vaginal walls    Past Surgical History:  Procedure Laterality Date  . ABDOMINAL HYSTERECTOMY    . COLONOSCOPY W/ BIOPSIES  02/01/2004   RMR: Anal papilla with normal rectum/Sigmoid diverticula/Small polyps in the cecum removed as described above. Inflammatory polyp  . ESOPHAGEAL DILATION     unknown year per patient.  . ESOPHAGEAL DILATION N/A 02/07/2015   RMR: Normal esophagus status post maloney dilation. Small hiatal hernia.   Marland Kitchen ESOPHAGOGASTRODUODENOSCOPY     unknown year per patient  . ESOPHAGOGASTRODUODENOSCOPY N/A 02/07/2015   RMR: Normal esophagus  status post  Maloney dilation. Small hiatal hernia  . NASAL SEPTUM SURGERY    . TONSILLECTOMY    . WRIST FRACTURE SURGERY     Social History   Social History  . Marital status: Married    Spouse name: N/A  . Number of children: N/A  . Years of education: N/A   Social History  Main Topics  . Smoking status: Never Smoker  . Smokeless tobacco: Never Used  . Alcohol use No  . Drug use: No  . Sexual activity: Not Currently    Birth control/ protection: None   Other Topics Concern  . None   Social History Narrative  . None   Family History  Problem Relation Age of Onset  . Colon polyps Neg Hx   . Colon cancer Neg Hx    Allergies  Allergen Reactions  . Latex Itching and Rash  . Adhesive [Tape] Other (See Comments)    Tears skin  . Betadine [Povidone Iodine]     blisters  . Ciprofloxacin Nausea And Vomiting  . Codeine Nausea And Vomiting    Upsets stomach, nightmares  . Penicillins Hives    Has patient had a PCN reaction causing immediate rash, facial/tongue/throat swelling, SOB or lightheadedness with hypotension: yes Has patient had a PCN reaction causing severe rash involving mucus membranes or skin necrosis: yes Has patient had a PCN reaction that required hospitalization: no Has patient had a PCN reaction occurring within the last 10 years: no If all of the above answers are "NO", then may proceed with Cephalosporin use.   . Sulfa Antibiotics Hives   Prior to Admission medications   Medication Sig Start Date End Date Taking? Authorizing Provider  acetaminophen (TYLENOL) 500 MG tablet Take 1,000 mg by mouth 2 (two) times daily as needed for mild pain, moderate pain or headache.  Yes [provider]  apixaban (ELIQUIS) 5 MG TABS tablet Take 1 tablet (5 mg total) by mouth 2 (two) times daily. 01/14/17  Yes BranchAlphonse Guild, MD  Calcium Carb-Cholecalciferol (CALCIUM-VITAMIN D) 500-200 MG-UNIT tablet Take 1 tablet by mouth daily.   Yes [provider]  cyanocobalamin 2000 MCG tablet Take 2,000 mcg by mouth daily.   Yes [provider]  diltiazem (CARDIZEM) 30 MG tablet Take 1 tablet (30 mg total) by mouth 2 (two) times daily. 02/24/17  Yes BranchAlphonse Guild, MD  DULoxetine (CYMBALTA) 60 MG capsule Take 60 mg by mouth 2  (two) times daily.    Yes [provider]  esomeprazole (NEXIUM) 20 MG capsule Take 1 capsule (20 mg total) by mouth daily at 12 noon. 11/06/16  Yes Carlis Stable, NP  Fluticasone-Salmeterol (ADVAIR) 250-50 MCG/DOSE AEPB Inhale 1 puff into the lungs daily.    Yes [provider]  metoprolol tartrate (LOPRESSOR) 25 MG tablet Take 1.5 tablets (37.5 mg total) by mouth 2 (two) times daily. 02/24/17  Yes Branch, Alphonse Guild, MD  polyethylene glycol powder (GLYCOLAX/MIRALAX) powder MIX 17 GRAMS WITH LIQUID AND TAKE BY MOUTH ONCE DAILY Patient taking differently: MIX 17 GRAMS WITH LIQUID AND TAKE BY MOUTH DAILY AS NEEDED FOR CONSTIPATION. 12/13/16  Yes Mahala Menghini, PA-C  pyridOXINE (VITAMIN B-6) 100 MG tablet Take 100 mg by mouth daily.   Yes [provider]   Dg Chest 1 View  Result Date: 06/10/2017 CLINICAL DATA:  Left hip fracture.  Preop imaging. EXAM: CHEST 1 VIEW COMPARISON:  12/21/2016 FINDINGS: No airspace consolidation. No pneumothorax. No effusion. Mild vascular prominence, likely due to supine positioning. Unchanged hilar, mediastinal and cardiac contours. IMPRESSION: No consolidation.  No large effusion. Electronically Signed   By: Andreas Newport M.D.   On: 06/10/2017 06:54   Dg Thoracic Spine 2 View  Result Date: 06/10/2017 CLINICAL DATA:  Presumed unwitnessed fall. Found on floor by family this morning. EXAM: THORACIC SPINE 2 VIEWS COMPARISON:  Chest CT 09/11/2016 FINDINGS: Unchanged kyphoscoliosis. The thoracic vertebrae are normal in height. No evidence of acute thoracic spine fracture. Moderately severe thoracic degenerative disc changes, with large osteophytes at the right lateral aspect of the vertebral column. No bone lesion or bony destruction. IMPRESSION: Kyphoscoliosis and moderate degenerative changes. No evidence of acute thoracic spine fracture. Electronically Signed   By: Andreas Newport M.D.   On: 06/10/2017 06:50   Dg Lumbar Spine Complete  Result  Date: 06/10/2017 CLINICAL DATA:  Presumed unwitnessed fall. Found on floor by family this morning. EXAM: LUMBAR SPINE - COMPLETE 4+ VIEW COMPARISON:  CT 09/11/2015 FINDINGS: Unchanged thoracolumbar scoliosis. No evidence of acute lumbar spine fracture. Moderately severe lumbar degenerative disc and facet changes. Sacroiliac joints are grossly intact. IMPRESSION: Thoracolumbar scoliosis. Degenerative disc and facet changes. Negative for acute lumbar spine fracture. Electronically Signed   By: Andreas Newport M.D.   On: 06/10/2017 06:51   Ct Head Wo Contrast  Result Date: 06/10/2017 CLINICAL DATA:  Head trauma, fall from bed EXAM: CT HEAD WITHOUT CONTRAST CT CERVICAL SPINE WITHOUT CONTRAST TECHNIQUE: Multidetector CT imaging of the head and cervical spine was performed following the standard protocol without intravenous contrast. Multiplanar CT image reconstructions of the cervical spine were also generated. COMPARISON:  Head CT 10/20/2013 FINDINGS: CT HEAD FINDINGS Brain: No mass lesion, intraparenchymal hemorrhage or extra-axial collection. No evidence of acute cortical infarct. There is periventricular hypoattenuation compatible with chronic microvascular disease. Vascular: No hyperdense vessel or  unexpected calcification. Skull: Normal visualized skull base, calvarium and extracranial soft tissues. Sinuses/Orbits: No sinus fluid levels or advanced mucosal thickening. No mastoid effusion. Normal orbits. CT CERVICAL SPINE FINDINGS Alignment: Reversal of the normal cervical lordosis without static subluxation. Facets are aligned. Occipital condyles are normally positioned. Skull base and vertebrae: No acute fracture. Soft tissues and spinal canal: No prevertebral fluid or swelling. No visible canal hematoma. Disc levels: No bony spinal canal stenosis. Fused left C2-C3 facets. Multilevel foraminal stenosis. Upper chest: Ground-glass opacities and septal thickening, possibly pulmonary edema. Other: Normal  visualized paraspinal cervical soft tissues. IMPRESSION: 1. 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 2. No acute fracture or static subluxation of the cervical spine. 3. Biapical groundglass opacity and septal thickening, likely pulmonary edema. Correlate with chest radiograph. Electronically Signed   By: Ulyses Jarred M.D.   On: 06/10/2017 06:41   Ct Cervical Spine Wo Contrast  Result Date: 06/10/2017 CLINICAL DATA:  Head trauma, fall from bed EXAM: CT HEAD WITHOUT CONTRAST CT CERVICAL SPINE WITHOUT CONTRAST TECHNIQUE: Multidetector CT imaging of the head and cervical spine was performed following the standard protocol without intravenous contrast. Multiplanar CT image reconstructions of the cervical spine were also generated. COMPARISON:  Head CT 10/20/2013 FINDINGS: CT HEAD FINDINGS Brain: No mass lesion, intraparenchymal hemorrhage or extra-axial collection. No evidence of acute cortical infarct. There is periventricular hypoattenuation compatible with chronic microvascular disease. Vascular: No hyperdense vessel or unexpected calcification. Skull: Normal visualized skull base, calvarium and extracranial soft tissues. Sinuses/Orbits: No sinus fluid levels or advanced mucosal thickening. No mastoid effusion. Normal orbits. CT CERVICAL SPINE FINDINGS Alignment: Reversal of the normal cervical lordosis without static subluxation. Facets are aligned. Occipital condyles are normally positioned. Skull base and vertebrae: No acute fracture. Soft tissues and spinal canal: No prevertebral fluid or swelling. No visible canal hematoma. Disc levels: No bony spinal canal stenosis. Fused left C2-C3 facets. Multilevel foraminal stenosis. Upper chest: Ground-glass opacities and septal thickening, possibly pulmonary edema. Other: Normal visualized paraspinal cervical soft tissues. IMPRESSION: 1. 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 2. No acute  fracture or static subluxation of the cervical spine. 3. Biapical groundglass opacity and septal thickening, likely pulmonary edema. Correlate with chest radiograph. Electronically Signed   By: Ulyses Jarred M.D.   On: 06/10/2017 06:41   Dg Hip Unilat W Or Wo Pelvis 2-3 Views Left  Result Date: 06/10/2017 CLINICAL DATA:  Presumed unwitnessed fall. Found on floor by family this morning EXAM: DG HIP (WITH OR WITHOUT PELVIS) 2-3V LEFT COMPARISON:  None. FINDINGS: There is an acute mildly comminuted intertrochanteric left hip fracture with varus angulation. No dislocation. Bony pelvis appears intact. Pubic symphysis and sacroiliac joints appear intact. Moderate right hip arthritis incidentally noted. IMPRESSION: Intertrochanteric left hip fracture with varus angulation. Electronically Signed   By: Andreas Newport M.D.   On: 06/10/2017 06:52    Positive ROS: All other systems have been reviewed and were otherwise negative with the exception of those mentioned in the HPI and as above.  Physical Exam: General: Alert, no acute distress Cardiovascular: No pedal edema Respiratory: No cyanosis, no use of accessory musculature GI: No organomegaly, abdomen is soft and non-tender Skin: No lesions in the area of chief complaint Neurologic: Sensation intact distally Psychiatric: Patient is competent for consent with normal mood and affect Lymphatic: No axillary or cervical lymphadenopathy  MUSCULOSKELETAL: Examination of the left hip reveals no skin wounds or lesions. She does have shortening and external rotation.She is neurovascularly intact distally.  Examination of bilateral upper extremities reveals no skin wounds or lesions. No crepitation,tenderness to palpation, or blocks to motion.  Examination of the right lower extremity reveals no skin wounds or lesions. No pain with range of motion of the hip or knee.  Assessment: Left reverse obliquity intertrochantericfemur fracture  Plan: I discussed  the findings with the patient and her family. She has an unstable left hip fracture variant. I recommended operative fixation in order to restore her mobility. We discussed the risks, benefits, and alternatives to intramedullary fixation of her left intertrochanteric femur fracture. We'll plan for surgery today. All questions were solicited and answered.  The risks, benefits, and alternatives were discussed with the patient. There are risks associated with the surgery including, but not limited to, problems with anesthesia (death), infection, differences in leg length/angulation/rotation, fracture of bones, loosening or failure of implants, malunion, nonunion, hematoma (blood accumulation) which may require surgical drainage, blood clots, pulmonary embolism, nerve injury (foot drop), and blood vessel injury. The patient understands these risks and elects to proceed.   Cruze Zingaro, Horald Pollen, MD Cell [phone]    06/11/2017 5:48 PM

## 2017-06-11 NOTE — Op Note (Signed)
OPERATIVE REPORT  SURGEON: Rod Can, MD   ASSISTANT: Staff.  PREOPERATIVE DIAGNOSIS: Comminuted reverse obliquity Left intertrochanteric femur fracture.   POSTOPERATIVE DIAGNOSIS: Comminuted reverse obliquity Left intertrochanteric femur fracture.  PROCEDURE: Intramedullary fixation, Left femur.   IMPLANTS: Biomet Affixus Hip Fracture Nail, 11 by 380 mm, 130 degrees. 10.5 x 85 mm Hip Fracture Nail Lag Screw. 5 x 38 mm distal interlocking screw 1.  ANESTHESIA:  General  ESTIMATED BLOOD LOSS: 50 mL.  ANTIBIOTICS: 900 mg clindamycin.  DRAINS: None.  COMPLICATIONS: None.   CONDITION: PACU - hemodynamically stable.   BRIEF CLINICAL NOTE: Sara Garcia is a 83 y.o. female who presented with an intertrochanteric femur fracture. The patient was admitted to the hospitalist service and underwent perioperative risk stratification and medical optimization. The risks, benefits, and alternatives to the procedure were explained, and the patient elected to proceed.  PROCEDURE IN DETAIL: Surgical site was marked by myself. The patient was taken to the operating room and anesthesia was induced on the bed. The patient was then transferred to the [facility] table and the nonoperative lower extremity was scissored underneath the operative side. The fracture was reduced with traction, internal rotation, and adduction. The hip was prepped and draped in the normal sterile surgical fashion. Timeout was called verifying side and site of surgery. Preop antibiotics were given with 60 minutes of beginning the procedure.  Fluoroscopy was used to define the patient's anatomy. A 4 cm incision was made just proximal to the tip of the greater trochanter. The awl was used to obtain the standard starting point for a trochanteric entry nail under fluoroscopic control. The guidepin was placed. The entry reamer was used to open the proximal femur.  I placed the guidewire to the level of the physeal scar of the knee. I  measured the length of the guidewire. Sequential reaming was performed up to a size 12.5 mm with excellent chatter. Therefore, a size 11 by 380 mm nail was selected and assembled to the jig on the back table. The nail was placed without any difficulty. Through a separate stab incision, the cannula was placed down to the bone in preparation for the cephalomedullary device. A guidepin was placed into the femoral head using AP and lateral fluoroscopy views. The pin was measured, and then reaming was performed to the appropriate depth. The lag screw was inserted to the appropriate depth. The fracture was compressed through the jig. The setscrew was tightened and then loosened one quarter turn. Using perfect circle technique, a distal interlocking screw was placed. The jig was removed. Final AP and lateral fluoroscopy views were obtained to confirm fracture reduction and hardware placement. Tip apex distance was appropriate. There was no chondral penetration.  The wounds were copiously irrigated with saline. The wound was closed in layers with #1 Vicryl for the fascia, 2-0 Monocryl for the deep dermal layer, and 3-0 Monocryl subcuticular stitch. Glue was applied to the skin. Once the glue was fully hardened, sterile dressing was applied. The patient was then awakened from anesthesia and taken to the PACU in stable condition. Sponge needle and instrument counts were correct at the end of the case 2. There were no known complications.  We will readmit the patient to the hospitalist. Weightbearing status will be touchdown weightbearing with a walker. We will start low dose apixaban 2.5 mg PO BID starting tomorrow. Full dose apixaban may be resumed on Saturday, 9/1. The patient will work with physical therapy and undergo disposition planning.

## 2017-06-12 ENCOUNTER — Encounter (HOSPITAL_COMMUNITY): Payer: Self-pay | Admitting: Orthopedic Surgery

## 2017-06-12 LAB — BASIC METABOLIC PANEL
Anion gap: 9 (ref 5–15)
BUN: 26 mg/dL — ABNORMAL HIGH (ref 6–20)
CO2: 25 mmol/L (ref 22–32)
Calcium: 8 mg/dL — ABNORMAL LOW (ref 8.9–10.3)
Chloride: 105 mmol/L (ref 101–111)
Creatinine, Ser: 0.84 mg/dL (ref 0.44–1.00)
GFR calc Af Amer: >60 mL/min (ref >60)
GFR calc non Af Amer: >60 mL/min (ref >60)
Glucose, Bld: 141 mg/dL — ABNORMAL HIGH (ref 65–99)
Potassium: 4 mmol/L (ref 3.5–5.1)
Sodium: 139 mmol/L (ref 135–145)

## 2017-06-12 LAB — CBC
HCT: 31.4 % — ABNORMAL LOW (ref 36.0–46.0)
Hemoglobin: 10.4 g/dL — ABNORMAL LOW (ref 12.0–15.0)
MCH: 29.1 pg (ref 26.0–34.0)
MCHC: 33.1 g/dL (ref 30.0–36.0)
MCV: 87.7 fL (ref 78.0–100.0)
Platelets: 213 10*3/uL (ref 150–400)
RBC: 3.58 MIL/uL — ABNORMAL LOW (ref 3.87–5.11)
RDW: 14.4 % (ref 11.5–15.5)
WBC: 12.4 10*3/uL — ABNORMAL HIGH (ref 4.0–10.5)

## 2017-06-12 LAB — GLUCOSE, CAPILLARY: Glucose-Capillary: 136 mg/dL — ABNORMAL HIGH (ref 65–99)

## 2017-06-12 NOTE — Progress Notes (Signed)
   Subjective:  Patient reports pain as mild to moderate.  Denies N/V/CP/SOB.  Objective:   VITALS:   Vitals:   06/12/17 0842 06/12/17 0910 06/12/17 1106 06/12/17 1423  BP:  115/67 (!) 109/53 (!) 100/52  Pulse:  (!) 126 92 80  Resp:  18  18  Temp:    98.5 F (36.9 C)  TempSrc:    Oral  SpO2: 91% 90%  94%  Height:  5\' 2"  (1.575 m)      NAD ABD soft Sensation intact distally Intact pulses distally Dorsiflexion/Plantar flexion intact Incision: dressing C/D/I Compartment soft   Lab Results  Component Value Date   WBC 12.4 (H) 06/12/2017   HGB 10.4 (L) 06/12/2017   HCT 31.4 (L) 06/12/2017   MCV 87.7 06/12/2017   PLT 213 06/12/2017   BMET    Component Value Date/Time   NA 139 06/12/2017 0427   K 4.0 06/12/2017 0427   CL 105 06/12/2017 0427   CO2 25 06/12/2017 0427   GLUCOSE 141 (H) 06/12/2017 0427   BUN 26 (H) 06/12/2017 0427   CREATININE 0.84 06/12/2017 0427   CREATININE 0.75 09/10/2016 1225   CALCIUM 8.0 (L) 06/12/2017 0427   GFRNONAA >60 06/12/2017 0427   GFRAA >60 06/12/2017 0427     Assessment/Plan: 1 Day Post-Op   Principal Problem:   Closed left hip fracture (HCC) Active Problems:   Essential hypertension   Atrial fibrillation (HCC)   Closed comminuted intertrochanteric fracture of left femur (HCC)   TDWB LLE with walker Apixaban low dose for now, resume full dose on Saturday PT/OT PO pain control D/C to SNF   Sara Garcia, Horald Pollen 06/12/2017, 8:22 PM   Rod Can, MD Cell [phone]

## 2017-06-12 NOTE — NC FL2 (Signed)
[place] LEVEL OF CARE SCREENING TOOL     IDENTIFICATION  Patient Name: Sara Garcia Birthdate: 1928/07/24 Sex: female Admission Date (Current Location): 06/10/2017  [facility] and [place] Number:  Herbalist and Address:  [facility],  [address], [place]      Provider Number: [id number]  Attending Physician Name and Address:  Theodis Blaze, MD  Relative Name and Phone Number:       Current Level of Care: Hospital Recommended Level of Care: [place] Prior Approval Number:    Date Approved/Denied:   PASRR Number: [id number]  Discharge Plan: SNF    Current Diagnoses: Patient Active Problem List   Diagnosis Date Noted  . Closed comminuted intertrochanteric fracture of left femur ([place]) 06/11/2017  . Closed left hip fracture ([place]) 06/10/2017  . Diarrhea 01/03/2016  . IDA (iron deficiency anemia) 11/09/2015  . Dysphagia, pharyngoesophageal phase   . Hiatal hernia   . Constipation 01/16/2015  . Esophageal dysphagia 06/27/2014  . GERD (gastroesophageal reflux disease) 06/27/2014  . Personal history of colonic polyps 06/27/2014  . Chronic anticoagulation 06/27/2014  . Primary osteoarthritis of right knee 05/09/2014  . DISH (diffuse idiopathic skeletal hyperostosis) 05/09/2014  . DDD (degenerative disc disease), lumbosacral 05/09/2014  . Prolapse of vaginal vault after hysterectomy 04/07/2014  . Atrial fibrillation ([place]) 01/10/2014  . Radiculopathy 01/10/2014  . Essential hypertension 12/21/2013    Orientation RESPIRATION BLADDER Height & Weight     Self, Time, Situation, Place  Normal Indwelling catheter Weight:   Height:  5\' 2"  (157.5 cm)  BEHAVIORAL SYMPTOMS/MOOD NEUROLOGICAL BOWEL NUTRITION STATUS      Continent Diet (regular thin fluid consistency)  AMBULATORY STATUS COMMUNICATION OF NEEDS Skin   Extensive Assist Verbally Surgical wounds                       Personal Care Assistance  Level of Assistance  Bathing, Feeding, Dressing Bathing Assistance: Limited assistance Feeding assistance: Independent Dressing Assistance: Limited assistance     Functional Limitations Info  Sight, Hearing, Speech Sight Info: Adequate Hearing Info: Adequate Speech Info: Adequate    SPECIAL CARE FACTORS FREQUENCY  PT (By licensed PT), OT (By licensed OT)     PT Frequency: 5x OT Frequency: 5x            Contractures Contractures Info: Not present    Additional Factors Info  Code Status, Allergies Code Status Info: DNR Allergies Info:  Latex, Adhesive Tape, Betadine Povidone Iodine, Ciprofloxacin, Codeine, Penicillins, Sulfa Antibiotics           Current Medications (06/12/2017):  This is the current hospital active medication list Current Facility-Administered Medications  Medication Dose Route Frequency Provider Last Rate Last Dose  . 0.9 %  sodium chloride infusion   Intravenous Continuous Rod Can, MD 50 mL/hr at 06/12/17 1300    . acetaminophen (TYLENOL) tablet 650 mg  650 mg Oral Q6H PRN Dessa Phi Chahn-Yang, DO       Or  . acetaminophen (TYLENOL) suppository 650 mg  650 mg Rectal Q6H PRN Dessa Phi Chahn-Yang, DO      . apixaban Arne [place]) tablet 2.5 mg  2.5 mg Oral BID Rod Can, MD   2.5 mg at 06/12/17 1110  . bisacodyl (DULCOLAX) EC tablet 5 mg  5 mg Oral Daily PRN Dessa Phi Chahn-Yang, DO      . diltiazem (CARDIZEM) tablet 30 mg  30 mg Oral BID Dessa Phi  Chahn-Yang, DO   30 mg at 06/12/17 1110  . docusate sodium (COLACE) capsule 100 mg  100 mg Oral BID Rod Can, MD   100 mg at 06/12/17 1143  . DULoxetine (CYMBALTA) DR capsule 60 mg  60 mg Oral BID Dessa Phi Chahn-Yang, DO   60 mg at 06/12/17 1106  . HYDROcodone-acetaminophen (NORCO/VICODIN) 5-325 MG per tablet 1-2 tablet  1-2 tablet Oral Q6H PRN Swinteck, Aaron Edelman, MD      . HYDROmorphone (DILAUDID) injection 1 mg  1 mg Intravenous Q3H PRN Dessa Phi Chahn-Yang, DO   1  mg at 06/12/17 1017  . lidocaine (LIDODERM) 5 % 1 patch  1 patch Transdermal QHS Schorr, Rhetta Mura, NP   1 patch at 06/12/17 0630  . menthol-cetylpyridinium (CEPACOL) lozenge 3 mg  1 lozenge Oral PRN Swinteck, Aaron Edelman, MD       Or  . phenol (CHLORASEPTIC) mouth spray 1 spray  1 spray Mouth/Throat PRN Swinteck, Aaron Edelman, MD      . methocarbamol (ROBAXIN) tablet 500 mg  500 mg Oral Q6H PRN Rod Can, MD   500 mg at 06/12/17 1127   Or  . methocarbamol (ROBAXIN) 500 mg in dextrose 5 % 50 mL IVPB  500 mg Intravenous Q6H PRN Rod Can, MD   Stopped at 06/11/17 2112  . metoCLOPramide (REGLAN) tablet 5-10 mg  5-10 mg Oral Q8H PRN Swinteck, Aaron Edelman, MD       Or  . metoCLOPramide (REGLAN) injection 5-10 mg  5-10 mg Intravenous Q8H PRN Swinteck, Aaron Edelman, MD      . metoprolol tartrate (LOPRESSOR) tablet 37.5 mg  37.5 mg Oral BID Dessa Phi Chahn-Yang, DO   37.5 mg at 06/12/17 1111  . mometasone-formoterol (DULERA) 200-5 MCG/ACT inhaler 2 puff  2 puff Inhalation BID Dessa Phi Chahn-Yang, DO   2 puff at 06/12/17 [phone]  . morphine 4 MG/ML injection 0.52 mg  0.52 mg Intravenous Q2H PRN Swinteck, Aaron Edelman, MD      . ondansetron [facility]) tablet 4 mg  4 mg Oral Q6H PRN Dessa Phi Chahn-Yang, DO       Or  . ondansetron [facility]) injection 4 mg  4 mg Intravenous Q6H PRN Dessa Phi Chahn-Yang, DO      . pantoprazole (PROTONIX) EC tablet 40 mg  40 mg Oral Daily Dessa Phi Chahn-Yang, DO   40 mg at 06/12/17 1108  . polyethylene glycol (MIRALAX / GLYCOLAX) packet 17 g  17 g Oral Daily PRN Dessa Phi Chahn-Yang, DO      . senna (SENOKOT) tablet 8.6 mg  1 tablet Oral BID Rod Can, MD   8.6 mg at 06/12/17 1108  . sodium chloride flush (NS) 0.9 % injection 3 mL  3 mL Intravenous Q12H Dessa Phi Chahn-Yang, DO   3 mL at 06/11/17 1000  . sodium chloride flush (NS) 0.9 % injection 3 mL  3 mL Intravenous PRN Shon Millet, DO         Discharge Medications: Please see  discharge summary for a list of discharge medications.  Relevant Imaging Results:  Relevant Lab Results:   Additional Information SS# [id number]  Nila Nephew, LCSW

## 2017-06-12 NOTE — Evaluation (Signed)
Physical Therapy Evaluation Patient Details Name: Sara Garcia MRN: [id number] DOB: [birth year] Today's Date: 06/12/2017   History of Present Illness  83 yo female admitted with closed left hip fracture. S/P Im nail L femur 06/11/17. Hx of falls, Afib, DVT, osteoporosis, HTN  Clinical Impression  On eval, pt required Max assist for mobility. She sat EOB for ~5 minutes with Min guard assist. Pt is not quite ready to attempt standing or pivoting so performed a lateral scoot, towards R side, bed to recliner. Increased time for all tasks. Moderate pain with activity. Discussed d/c plan-recommend ST rehab at SNF.      Follow Up Recommendations SNF    Equipment Recommendations  None recommended by PT    Recommendations for Other Services       Precautions / Restrictions Precautions Precautions: Fall Restrictions Weight Bearing Restrictions: Yes LLE Weight Bearing: Partial weight bearing LLE Partial Weight Bearing Percentage or Pounds: 30%      Mobility  Bed Mobility Overal bed mobility: Needs Assistance Bed Mobility: Supine to Sit     Supine to sit: Mod assist;HOB elevated     General bed mobility comments: Increased time. Multimodal cues for safety, technique, hand placement. Assist for L LE and trunk. Utilized bedpad for scooting, positioning.   Transfers Overall transfer level: Needs assistance   Transfers: Lateral/Scoot Transfers          Lateral/Scoot Transfers: Mod assist;Max assist General transfer comment: Mod-Max for scoot, bed to recliner, depending on pain. Utilized bedpad to aid with scooting. Multimodal cueing required for safety, technique. Increased time. Pt required some rest breaks.   Ambulation/Gait             General Gait Details: NT  Stairs            Wheelchair Mobility    Modified Rankin (Stroke Patients Only)       Balance Overall balance assessment: Needs assistance;History of Falls   Sitting balance-Leahy Scale: Fair Sitting  balance - Comments: Some posterior leaning noted at times. No LOB.                                      Pertinent Vitals/Pain Pain Assessment: Faces Faces Pain Scale: Hurts even more Pain Location: L LE with activity. Pt also c/o tenderness of bil feet.  Pain Descriptors / Indicators: Aching;Sore;Sharp;Grimacing;Guarding Pain Intervention(s): Limited activity within patient's tolerance;Repositioned    Home Living                        Prior Function                 Hand Dominance        Extremity/Trunk Assessment                Communication      Cognition Arousal/Alertness: Awake/alert Behavior During Therapy: WFL for tasks assessed/performed Overall Cognitive Status: Within Functional Limits for tasks assessed                                        General Comments      Exercises     Assessment/Plan    PT Assessment Patient needs continued PT services  PT Problem List Decreased strength;Decreased mobility;Decreased range of motion;Decreased activity tolerance;Decreased balance;Decreased knowledge of use  of DME;Pain       PT Treatment Interventions DME instruction;Gait training;Therapeutic exercise;Patient/family education;Therapeutic activities;Functional mobility training;Balance training    PT Goals (Current goals can be found in the Care Plan section)  Acute Rehab PT Goals Patient Stated Goal: regain PLOF PT Goal Formulation: With patient/family Time For Goal Achievement: 06/26/17 Potential to Achieve Goals: Good    Frequency Min 3X/week   Barriers to discharge        Co-evaluation               AM-PAC PT "6 Clicks" Daily Activity  Outcome Measure Difficulty turning over in bed (including adjusting bedclothes, sheets and blankets)?: Unable Difficulty moving from lying on back to sitting on the side of the bed? : Unable Difficulty sitting down on and standing up from a chair with arms  (e.g., wheelchair, bedside commode, etc,.)?: Unable Help needed moving to and from a bed to chair (including a wheelchair)?: A Lot Help needed walking in hospital room?: A Lot Help needed climbing 3-5 steps with a railing? : Total 6 Click Score: 8    End of Session Equipment Utilized During Treatment: Gait belt Activity Tolerance: Patient limited by pain Patient left: in chair;with call bell/phone within reach;with family/visitor present   PT Visit Diagnosis: Muscle weakness (generalized) (M62.81);Difficulty in walking, not elsewhere classified (R26.2);Pain Pain - Right/Left: Left Pain - part of body: Leg    Time: 1030-1106 PT Time Calculation (min) (ACUTE ONLY): 36 min   Charges:     PT Treatments $Therapeutic Activity: 23-37 mins   PT G Codes:          Sara Garcia, MPT Pager: [phone]

## 2017-06-12 NOTE — Care Management Note (Signed)
Case Management Note  Patient Details  Name: Sara Garcia MRN: [id number] Date of Birth: 1928-08-13  Subjective/Objective:  Pt admitted with a fall.                  Action/Plan:Intramedullary fixation, Left femur.   Expected Discharge Date:   (unknown)               Expected Discharge Plan:  Skilled Nursing Facility  In-House Referral:  Clinical Social Work  Discharge planning Services  CM Consult  Post Acute Care Choice:    Choice offered to:  Patient  DME Arranged:    DME Agency:     HH Arranged:    [place] Agency:     Status of Service:  Completed, signed off  If discussed at [employer], dates discussed:    Additional CommentsPurcell Mouton, RN 06/12/2017, 11:48 AM

## 2017-06-12 NOTE — Clinical Social Work Note (Signed)
Clinical Social Work Assessment  Patient Details  Name: Sara Garcia MRN: [id number] Date of Birth: 08-06-1928  Date of referral:  06/12/17               Reason for consult:  Facility Placement                Permission sought to share information with:  Family Supports Permission granted to share information::  Yes, Verbal Permission Granted  Name::     daughter [name]  Agency::     Relationship::     Contact Information:     Housing/Transportation Living arrangements for the past 2 months:  Single Family Home Source of Information:  Patient Patient Interpreter Needed:  None Criminal Activity/Legal Involvement Pertinent to Current Situation/Hospitalization:  No - Comment as needed Significant Relationships:  None Lives with:  Self Do you feel safe going back to the place where you live?  Yes Need for family participation in patient care:  No (Coment)  Care giving concerns:  Pt from home where she lives independently with husband. Was using walker or cane to ambulate prior to falling at home- admitted with hip fracture, now post op.  Pt has dementia but is largely independent and oriented per daughter.   Social Worker assessment / plan:  CSW consulted for assistance with ST rehab referral. Met with pt and daughter at bedside. Both state they are familiar with rehab at SNF due to pt's husband entering SNF after an accident. Are requesting [facility] or [facility]- pt from [place], completed FL2 and made referrals. Pt will need [facility] preauthorization for SNF once bed offers made and facility selected.  Plan: SNF for ST rehab at [place]. Will follow up with bed offers.  Employment status:  Retired Nurse, adult PT Recommendations:  [place] / Referral to community resources:  [place]  Patient/Family's Response to care:  Appreciative of care  Patient/Family's Understanding of and  Emotional Response to Diagnosis, Current Treatment, and Prognosis:  Both pt and family demonstrate adequate understanding of plan and are emotionally engaged, enthusiastic, and positive.   Emotional Assessment Appearance:  Appears stated age Attitude/Demeanor/Rapport:   (pleasant) Affect (typically observed):  Accepting, Calm, Adaptable Orientation:  Oriented to Self, Oriented to Place, Oriented to  Time, Oriented to Situation Alcohol / Substance use:  Not Applicable Psych involvement (Current and /or in the community):  No (Comment)  Discharge Needs  Concerns to be addressed:  Discharge Planning Concerns Readmission within the last 30 days:  No Current discharge risk:  None Barriers to Discharge:  Continued Medical Work up   [employer], LCSW 06/12/2017, 4:02 PM  [phone]

## 2017-06-12 NOTE — Progress Notes (Addendum)
Patient ID: Sara Garcia, female   DOB: [birth year], 83 y.o.   MRN: [id number]    PROGRESS NOTE  KALIEGH WILLADSEN  ZHG:[id number] DOB: Oct 28, 1927 DOA: 06/10/2017  PCP: Doree Albee, MD   Brief Narrative:  83 y.o. female with medical history significant of atrial fibrillation on Eliquis, previous history of DVT, essential hypertension, osteoporosis, GERD who presented after she rolled out of bed the morning of the admission. She landed on her left hip and denids hitting her head or losing consciousness. She was not able to get off the floor until the paramedics arrived.   ED Course: Imaging in the emergency department revealed intertrochanteric left hip fracture with varus angulation. Orthopedic surgery consulted. Eliquis has been on hold.   Assessment & Plan:   Left hip fracture after fall - s/p left hip repair, post op day #1 - PT eval done, SNF recommended - analgesia as needed  - incentive spirometry   Leukocytosis - reactive - monitor   Paroxysmal atrial fibrillation - CHADS2Vasc 4 - currently in NSR - rate controlled, continue Cardizem and Metoprolol   HTN - stable - continue home med regimen   Depression - Continue Cymbalta  GERD - Continue PPI  DVT prophylaxis: Eliquis Code Status: DNR Family Communication: Patient at bedside, daughter at bedside  Disposition Plan: SNF in 1-2 days   Consultants:   Ortho  Procedures:   L hip repair 8/29   Antimicrobials:   Pre op   Subjective: Pt reports feeling better.   Objective: Vitals:   06/12/17 0730 06/12/17 0842 06/12/17 0910 06/12/17 1106  BP:   115/67 (!) 109/53  Pulse:   (!) 126 92  Resp:   18   Temp:      TempSrc:      SpO2: 96% 91% 90%   Height:   5\' 2"  (1.575 m)     Intake/Output Summary (Last 24 hours) at 06/12/17 1330 Last data filed at 06/12/17 1200  Gross per 24 hour  Intake          2293.33 ml  Output              600 ml  Net          1693.33 ml   There were no vitals filed for this  visit.  Physical Exam  Constitutional: Appears calm, NAD CVS: RRR, S1/S2 +, no murmurs, no gallops, no carotid bruit.  Pulmonary: Effort and breath sounds normal, no stridor, diminished breath sounds at bases  Abdominal: Soft. BS +,  no distension, tenderness, rebound or guarding.  Musculoskeletal: Normal range of motion. No edema and no tenderness.   Data Reviewed: I have personally reviewed following labs and imaging studies  CBC:  Recent Labs Lab 06/10/17 0655 06/11/17 0402 06/12/17 0358  WBC 14.3* 12.3* 12.4*  NEUTROABS 10.7*  --   --   HGB 11.6* 11.0* 10.4*  HCT 35.7* 35.0* 31.4*  MCV 87.5 89.7 87.7  PLT 224 242 196   Basic Metabolic Panel:  Recent Labs Lab 06/10/17 0655 06/11/17 0402 06/12/17 0427  NA 143 139 139  K 4.7 3.5 4.0  CL 114* 107 105  CO2 21* 22 25  GLUCOSE 124* 147* 141*  BUN 26* 23* 26*  CREATININE 0.81 0.90 0.84  CALCIUM 7.5* 8.3* 8.0*   Coagulation Profile:  Recent Labs Lab 06/10/17 0655  INR 1.27   CBG:  Recent Labs Lab 06/11/17 0644 06/11/17 1117 06/12/17 0809  GLUCAP 128* 113* 136*   Urine  analysis:    Component Value Date/Time   COLORURINE STRAW (A) 06/10/2017 0642   APPEARANCEUR CLEAR 06/10/2017 0642   LABSPEC 1.020 06/10/2017 0642   PHURINE 6.5 06/10/2017 0642   GLUCOSEU NEGATIVE 06/10/2017 0642   HGBUR NEGATIVE 06/10/2017 0642   BILIRUBINUR NEGATIVE 06/10/2017 0642   KETONESUR NEGATIVE 06/10/2017 0642   PROTEINUR NEGATIVE 06/10/2017 0642   UROBILINOGEN 0.2 10/20/2013 1434   NITRITE NEGATIVE 06/10/2017 0642   LEUKOCYTESUR NEGATIVE 06/10/2017 0642   Recent Results (from the past 240 hour(s))  Surgical PCR screen     Status: Abnormal   Collection Time: 06/10/17  4:33 PM  Result Value Ref Range Status   MRSA, PCR NEGATIVE NEGATIVE Final   Staphylococcus aureus POSITIVE (A) NEGATIVE Final    Comment: (NOTE) The Xpert SA Assay (FDA approved for NASAL specimens in patients 48 years of age and older), is one  component of a comprehensive surveillance program. It is not intended to diagnose infection nor to guide or monitor treatment.     Radiology Studies: Pelvis Portable  Result Date: 06/11/2017 CLINICAL DATA:  83 y/o  F; status post left intramedullary nail. EXAM: LEFT FEMUR PORTABLE 2 VIEWS; PORTABLE PELVIS 1-2 VIEWS COMPARISON:  06/10/2017 left lower extremity radiographs. FINDINGS: Pelvis: Interval placement of a left femur intramedullary nail, femoral head lag screw, and distal interlocking screw fixing an acute intertrochanteric proximal femur fracture. There is improved alignment with minimal residual anterolateral apex angulation of the fracture and displacement of fracture components. Mild air and edema in the left hip soft tissues compatible postsurgical changes. No new fracture identified. The left hip joint is well maintained. Mild-to-moderate osteoarthrosis of the right hip joint with joint space narrowing. Left femur: Interval placement of a left femur intramedullary nail, femoral head lag screw, and distal interlocking screw fixing an acute intertrochanteric proximal femur fracture. There is improved alignment with minimal residual anterolateral apex angulation of the fracture and displacement of fracture components. IMPRESSION: Interval placement of the left femur intramedullary nail fixing an acute intertrochanteric proximal left femur fracture with expected postsurgical changes. Electronically Signed   By: Kristine Garbe M.D.   On: 06/11/2017 21:16   Dg C-arm 61-120 Min-no Report  Result Date: 06/11/2017 Fluoroscopy was utilized by the requesting physician.  No radiographic interpretation.   Dg Femur Port Min 2 Views Left  Result Date: 06/11/2017 CLINICAL DATA:  83 y/o  F; status post left intramedullary nail. EXAM: LEFT FEMUR PORTABLE 2 VIEWS; PORTABLE PELVIS 1-2 VIEWS COMPARISON:  06/10/2017 left lower extremity radiographs. FINDINGS: Pelvis: Interval placement of a left  femur intramedullary nail, femoral head lag screw, and distal interlocking screw fixing an acute intertrochanteric proximal femur fracture. There is improved alignment with minimal residual anterolateral apex angulation of the fracture and displacement of fracture components. Mild air and edema in the left hip soft tissues compatible postsurgical changes. No new fracture identified. The left hip joint is well maintained. Mild-to-moderate osteoarthrosis of the right hip joint with joint space narrowing. Left femur: Interval placement of a left femur intramedullary nail, femoral head lag screw, and distal interlocking screw fixing an acute intertrochanteric proximal femur fracture. There is improved alignment with minimal residual anterolateral apex angulation of the fracture and displacement of fracture components. IMPRESSION: Interval placement of the left femur intramedullary nail fixing an acute intertrochanteric proximal left femur fracture with expected postsurgical changes. Electronically Signed   By: Kristine Garbe M.D.   On: 06/11/2017 21:16    Scheduled Meds . apixaban  2.5 mg  Oral BID  . diltiazem  30 mg Oral BID  . docusate sodium  100 mg Oral BID  . DULoxetine  60 mg Oral BID  . lidocaine  1 patch Transdermal QHS  . metoprolol tartrate  37.5 mg Oral BID  . mometasone-formoterol  2 puff Inhalation BID  . pantoprazole  40 mg Oral Daily  . senna  1 tablet Oral BID  . sodium chloride flush  3 mL Intravenous Q12H   Continuous Infusions: . sodium chloride 50 mL/hr at 06/11/17 2233  . methocarbamol (ROBAXIN)  IV Stopped (06/11/17 2112)     LOS: 2 days   Time spent: 25 minutes  Faye Ramsay, MD [facility] Pager [phone]  If 7PM-7AM, please contact night-coverage [email or web address] Password [email or web address] 06/12/2017, 1:30 PM

## 2017-06-12 NOTE — Progress Notes (Signed)
OT Cancellation Note  Patient Details Name: Sara Garcia MRN: [id number] DOB: 30-Aug-1928   Cancelled Treatment:     Pt sitting in chair.  Pt declined getting to 3 n 1 or back to bed at this time.  Nursing student aware. Will check on on pt next day  Kari Baars, [place]  Payton Mccallum D 06/12/2017, 1:17 PM

## 2017-06-13 LAB — BASIC METABOLIC PANEL
Anion gap: 7 (ref 5–15)
BUN: 26 mg/dL — ABNORMAL HIGH (ref 6–20)
CO2: 24 mmol/L (ref 22–32)
Calcium: 8.2 mg/dL — ABNORMAL LOW (ref 8.9–10.3)
Chloride: 107 mmol/L (ref 101–111)
Creatinine, Ser: 0.85 mg/dL (ref 0.44–1.00)
GFR calc Af Amer: >60 mL/min (ref >60)
GFR calc non Af Amer: 60 mL/min — ABNORMAL LOW (ref >60)
Glucose, Bld: 131 mg/dL — ABNORMAL HIGH (ref 65–99)
Potassium: 4 mmol/L (ref 3.5–5.1)
Sodium: 138 mmol/L (ref 135–145)

## 2017-06-13 LAB — CBC
HCT: 27.5 % — ABNORMAL LOW (ref 36.0–46.0)
Hemoglobin: 9.1 g/dL — ABNORMAL LOW (ref 12.0–15.0)
MCH: 29.9 pg (ref 26.0–34.0)
MCHC: 33.1 g/dL (ref 30.0–36.0)
MCV: 90.5 fL (ref 78.0–100.0)
Platelets: 205 10*3/uL (ref 150–400)
RBC: 3.04 MIL/uL — ABNORMAL LOW (ref 3.87–5.11)
RDW: 15.4 % (ref 11.5–15.5)
WBC: 14.5 10*3/uL — ABNORMAL HIGH (ref 4.0–10.5)

## 2017-06-13 LAB — URINE CULTURE

## 2017-06-13 LAB — GLUCOSE, CAPILLARY: Glucose-Capillary: 123 mg/dL — ABNORMAL HIGH (ref 65–99)

## 2017-06-13 MED ORDER — SENNOSIDES-DOCUSATE SODIUM 8.6-50 MG PO TABS
1.0000 | ORAL_TABLET | Freq: Two times a day (BID) | ORAL | Status: DC
  Administered 2017-06-13 – 2017-06-17 (×9): 1 via ORAL
  Filled 2017-06-13 (×11): qty 1

## 2017-06-13 MED ORDER — HYDROCODONE-ACETAMINOPHEN 5-325 MG PO TABS: 1.0000 | ORAL_TABLET | Freq: Four times a day (QID) | ORAL | 0 refills | Status: DC | PRN

## 2017-06-13 NOTE — Progress Notes (Addendum)
Physical Therapy Treatment Patient Details Name: Sara Garcia MRN: [id number] DOB: 11/15/1927 Today's Date: 06/13/2017    History of Present Illness 83 yo female admitted with closed left hip fracture. S/P Im nail L femur 06/11/17. Hx of falls, Afib, DVT, osteoporosis, HTN    PT Comments    POD # 2 Pt just got back to recliner from [facility] with NT.  NT reported pt required Max/[place] Assist.  Performed L LE TE's AAROM with increased time to minimize pain. Applied ICE to L hip.   Follow Up Recommendations  SNF ([facility])     Equipment Recommendations  None recommended by PT    Recommendations for Other Services       Precautions / Restrictions Precautions Precautions: Fall Precaution Comments: DO NOT TOUCH HER FEET Restrictions Weight Bearing Restrictions: Yes LLE Weight Bearing: Partial weight bearing LLE Partial Weight Bearing Percentage or Pounds: 30%    Mobility        Balance Overall balance assessment: Needs assistance;History of Falls   Sitting balance-Leahy Scale: Fair                                      Cognition Arousal/Alertness: Awake/alert Behavior During Therapy: WFL for tasks assessed/performed Overall Cognitive Status: Within Functional Limits for tasks assessed                                        Exercises  10 reps B LE AP  10 reps B LE knee presses  10 reps L LE heel slides AAROM  10 reps L LE hip ABD/ADD AAROM  10 reps B gluteal squeezes  10 reps L LE SAQ's    General Comments        Pertinent Vitals/Pain Pain Assessment: Faces Faces Pain Scale: Hurts even more Pain Location: during TE's Pain Descriptors / Indicators: Aching;Sore;Sharp;Grimacing;Guarding Pain Intervention(s): Monitored during session;Repositioned;Ice applied    Home Living Family/patient expects to be discharged to:: Skilled nursing facility                    Prior Function            PT Goals (current goals can now be  found in the care plan section) Acute Rehab PT Goals Patient Stated Goal: regain PLOF Progress towards PT goals: Progressing toward goals    Frequency    Min 3X/week      PT Plan Current plan remains appropriate    Co-evaluation              AM-PAC PT "6 Clicks" Daily Activity  Outcome Measure  Difficulty turning over in bed (including adjusting bedclothes, sheets and blankets)?: Unable Difficulty moving from lying on back to sitting on the side of the bed? : Unable Difficulty sitting down on and standing up from a chair with arms (e.g., wheelchair, bedside commode, etc,.)?: Unable Help needed moving to and from a bed to chair (including a wheelchair)?: A Lot Help needed walking in hospital room?: A Lot Help needed climbing 3-5 steps with a railing? : Total 6 Click Score: 8    End of Session Equipment Utilized During Treatment: Gait belt Activity Tolerance: Patient limited by pain Patient left: in chair;with call bell/phone within reach;with family/visitor present   PT Visit Diagnosis: Pain Pain - Right/Left: Left  Time: 1435-1500 PT Time Calculation (min) (ACUTE ONLY): 25 min  Charges:  $Therapeutic Exercise: 23-37 mins                    G Codes:       Rica Koyanagi  PTA [facility]  Acute  Rehab Pager      [phone]

## 2017-06-13 NOTE — Evaluation (Signed)
Occupational Therapy Evaluation Patient Details Name: Sara Garcia MRN: [id number] DOB: 18-Jun-1928 Today's Date: 06/13/2017    History of Present Illness 83 yo female admitted with closed left hip fracture. S/P Im nail L femur 06/11/17. Hx of falls, Afib, DVT, osteoporosis, HTN   Clinical Impression   Pt admitted with hip fx. Pt currently with functional limitations due to the deficits listed below (see OT Problem List).  Pt will benefit from skilled OT to increase their safety and independence with ADL and functional mobility for ADL to facilitate discharge to venue listed below.      Follow Up Recommendations  SNF    Equipment Recommendations  None recommended by OT    Recommendations for Other Services       Precautions / Restrictions Precautions Precautions: Fall Restrictions Weight Bearing Restrictions: Yes LLE Weight Bearing: Partial weight bearing LLE Partial Weight Bearing Percentage or Pounds: 30%      Mobility Bed Mobility Overal bed mobility: Needs Assistance Bed Mobility: Supine to Sit     Supine to sit: Mod assist;HOB elevated        Transfers Overall transfer level: Needs assistance Equipment used: Rolling walker (2 wheeled) Transfers: Sit to/from [facility] Sit to Stand: Max assist Stand pivot transfers: Max assist;+2 physical assistance      Lateral/Scoot Transfers: Max assist      Balance Overall balance assessment: Needs assistance;History of Falls   Sitting balance-Leahy Scale: Fair                                     ADL either performed or assessed with clinical judgement   ADL Overall ADL's : Needs assistance/impaired Eating/Feeding: Set up;Sitting   Grooming: Set up;Sitting   Upper Body Bathing: Minimal assistance;Sitting   Lower Body Bathing: Maximal assistance;Sit to/from stand;Cueing for safety;Cueing for sequencing   Upper Body Dressing : Minimal assistance;Sitting   Lower Body Dressing:  Maximal assistance;Sit to/from stand;Cueing for safety;Cueing for sequencing   Toilet Transfer: Maximal assistance;RW;Stand-pivot;Cueing for sequencing;Cueing for safety;+2 for safety/equipment   Toileting- Clothing Manipulation and Hygiene: Maximal assistance;Sit to/from stand;Cueing for safety;Cueing for sequencing;Cueing for compensatory techniques         General ADL Comments: VC for PWB with max encouragement     Vision Patient Visual Report: No change from baseline              Pertinent Vitals/Pain Pain Assessment: 0-10 Faces Pain Scale: Hurts even more Pain Location: L LE with activity. Pt also c/o tenderness of bil feet.  Pain Descriptors / Indicators: Aching;Sore;Sharp;Grimacing;Guarding Pain Intervention(s): Limited activity within patient's tolerance;Monitored during session;Repositioned;Patient requesting pain meds-RN notified        Extremity/Trunk Assessment Upper Extremity Assessment Upper Extremity Assessment: Generalized weakness              Cognition Arousal/Alertness: Awake/alert Behavior During Therapy: WFL for tasks assessed/performed Overall Cognitive Status: Within Functional Limits for tasks assessed                                                Home Living Family/patient expects to be discharged to:: Skilled nursing facility  OT Problem List: Decreased strength;Decreased activity tolerance;Decreased knowledge of use of DME or AE;Decreased safety awareness;Impaired balance (sitting and/or standing);Pain;Decreased knowledge of precautions      OT Treatment/Interventions: Self-care/ADL training;Patient/family education;DME and/or AE instruction    OT Goals(Current goals can be found in the care plan section) Acute Rehab OT Goals Patient Stated Goal: regain PLOF OT Goal Formulation: With patient Time For Goal Achievement: 06/27/17  OT Frequency: Min  2X/week   Barriers to D/C: Decreased caregiver support             AM-PAC PT "6 Clicks" Daily Activity     Outcome Measure Help from another person eating meals?: None Help from another person taking care of personal grooming?: A Little Help from another person toileting, which includes using toliet, bedpan, or urinal?: Total Help from another person bathing (including washing, rinsing, drying)?: A Lot Help from another person to put on and taking off regular upper body clothing?: A Little Help from another person to put on and taking off regular lower body clothing?: A Lot 6 Click Score: 15   End of Session Equipment Utilized During Treatment: Rolling walker Nurse Communication: Mobility status;Weight bearing status  Activity Tolerance: Patient tolerated treatment well Patient left: in chair;with call bell/phone within reach  OT Visit Diagnosis: Unsteadiness on feet (R26.81);Muscle weakness (generalized) (M62.81);Pain;History of falling (Z91.81);Repeated falls (R29.6) Pain - Right/Left: Left Pain - part of body: Hip                Time: 3295-1884 OT Time Calculation (min): 41 min Charges:  OT General Charges $OT Visit: 1 Visit OT Evaluation $OT Eval Moderate Complexity: 1 Mod OT Treatments $Self Care/Home Management : 23-37 mins G-Codes:     Kari Baars, OT [phone]  Payton Mccallum D 06/13/2017, 12:25 PM

## 2017-06-13 NOTE — Progress Notes (Addendum)
CSW met with patient and family at bedside, pt. Family is agreeable to [facility]. Insurance authorization started, family informed that authorization can take 48 hours. Facility reports patient cannot admit until auth. Is received.  Authorization started.    , LCSWA, MSW Clinical Social Worker 5E and Psychiatric Service Line [phone] 06/13/2017  10:34 AM 

## 2017-06-13 NOTE — Progress Notes (Signed)
Plan to discharge to SNF, CSW following.  

## 2017-06-13 NOTE — Progress Notes (Signed)
Patient ID: Sara Garcia, female   DOB: [birth year], 83 y.o.   MRN: [id number]    PROGRESS NOTE  Sara Garcia  TKW:[id number] DOB: Mar 06, 1928 DOA: 06/10/2017  PCP: Doree Albee, MD   Brief Narrative:  83 y.o. female with medical history significant of atrial fibrillation on Eliquis, previous history of DVT, essential hypertension, osteoporosis, GERD who presented after she rolled out of bed the morning of the admission. She landed on her left hip and denids hitting her head or losing consciousness. She was not able to get off the floor until the paramedics arrived.   ED Course: Imaging in the emergency department revealed intertrochanteric left hip fracture with varus angulation. Orthopedic surgery consulted. Eliquis has been on hold.   Assessment & Plan:   Left hip fracture after fall - s/p left hip repair, post op day #2 - PT eval done, SNF recommended, awaiting for insurance authorization  - analgesia as needed  - incentive spirometry  - low dose Eliquis for DVT prophylaxis   Leukocytosis - still trending up but no clear infectious source identified - will ask for UA for completeness purpose, hold off on CXR and further eval as pt asymptomatic at this time  - monitor   Post op drop in Hg - no indication for transfusion at this time - no evidence of active bleeding   Paroxysmal atrial fibrillation - CHADS2Vasc 4 - currently in NSR - continue Cardizem and Metoprolol  - rate controlled   HTN - stable  - continue home med regimen   Depression - Continue Cymbalta  GERD - Continue PPI  DVT prophylaxis: Eliquis Code Status: DNR Family Communication: Patient at bedside, daughter at bedside  Disposition Plan: SNF when authorization completed   Consultants:   Ortho  Procedures:   L hip repair 8/29   Antimicrobials:   Pre op   Subjective: Pt denies concerns this AM, daughter says urine is dark.   Objective: Vitals:   06/13/17 0443 06/13/17 0657 06/13/17  0747 06/13/17 1116  BP: (!) 127/57 121/61  (!) 122/54  Pulse: 82 (!) 109  74  Resp: 17 18    Temp: 98.7 F (37.1 C) 98.3 F (36.8 C)    TempSrc: Oral Oral    SpO2: 92% 95% 90%   Weight:      Height:        Intake/Output Summary (Last 24 hours) at 06/13/17 1143 Last data filed at 06/13/17 0915  Gross per 24 hour  Intake             1660 ml  Output             1400 ml  Net              260 ml   Filed Weights   06/12/17 2033  Weight: 71.4 kg (157 lb 6.5 oz)    Physical Exam  Constitutional: Appears calm, NAD CVS: RRR, S1/S2 +, no murmurs, no gallops, no carotid bruit.  Pulmonary: Effort and breath sounds normal, no stridor Abdominal: Soft. BS +,  no distension, tenderness, rebound or guarding.   Data Reviewed: I have personally reviewed following labs and imaging studies  CBC:  Recent Labs Lab 06/10/17 0655 06/11/17 0402 06/12/17 0358 06/13/17 0422  WBC 14.3* 12.3* 12.4* 14.5*  NEUTROABS 10.7*  --   --   --   HGB 11.6* 11.0* 10.4* 9.1*  HCT 35.7* 35.0* 31.4* 27.5*  MCV 87.5 89.7 87.7 90.5  PLT 224 242 213  245   Basic Metabolic Panel:  Recent Labs Lab 06/10/17 0655 06/11/17 0402 06/12/17 0427 06/13/17 0611  NA 143 139 139 138  K 4.7 3.5 4.0 4.0  CL 114* 107 105 107  CO2 21* 22 25 24   GLUCOSE 124* 147* 141* 131*  BUN 26* 23* 26* 26*  CREATININE 0.81 0.90 0.84 0.85  CALCIUM 7.5* 8.3* 8.0* 8.2*   Coagulation Profile:  Recent Labs Lab 06/10/17 0655  INR 1.27   CBG:  Recent Labs Lab 06/11/17 0644 06/11/17 1117 06/12/17 0809 06/13/17 0719  GLUCAP 128* 113* 136* 123*   Urine analysis:    Component Value Date/Time   COLORURINE STRAW (A) 06/10/2017 0642   APPEARANCEUR CLEAR 06/10/2017 0642   LABSPEC 1.020 06/10/2017 0642   PHURINE 6.5 06/10/2017 0642   GLUCOSEU NEGATIVE 06/10/2017 0642   HGBUR NEGATIVE 06/10/2017 0642   BILIRUBINUR NEGATIVE 06/10/2017 0642   KETONESUR NEGATIVE 06/10/2017 0642   PROTEINUR NEGATIVE 06/10/2017 0642    UROBILINOGEN 0.2 10/20/2013 1434   NITRITE NEGATIVE 06/10/2017 0642   LEUKOCYTESUR NEGATIVE 06/10/2017 0642   Recent Results (from the past 240 hour(s))  Surgical PCR screen     Status: Abnormal   Collection Time: 06/10/17  4:33 PM  Result Value Ref Range Status   MRSA, PCR NEGATIVE NEGATIVE Final   Staphylococcus aureus POSITIVE (A) NEGATIVE Final    Comment: (NOTE) The Xpert SA Assay (FDA approved for NASAL specimens in patients 73 years of age and older), is one component of a comprehensive surveillance program. It is not intended to diagnose infection nor to guide or monitor treatment.   Urine Culture     Status: Abnormal   Collection Time: 06/11/17  6:22 PM  Result Value Ref Range Status   Specimen Description URINE, CATHETERIZED  Final   Special Requests NONE  Final   Culture MULTIPLE SPECIES PRESENT, SUGGEST RECOLLECTION (A)  Final   Report Status 06/13/2017 FINAL  Final    Radiology Studies: Pelvis Portable  Result Date: 06/11/2017 CLINICAL DATA:  83 y/o  F; status post left intramedullary nail. EXAM: LEFT FEMUR PORTABLE 2 VIEWS; PORTABLE PELVIS 1-2 VIEWS COMPARISON:  06/10/2017 left lower extremity radiographs. FINDINGS: Pelvis: Interval placement of a left femur intramedullary nail, femoral head lag screw, and distal interlocking screw fixing an acute intertrochanteric proximal femur fracture. There is improved alignment with minimal residual anterolateral apex angulation of the fracture and displacement of fracture components. Mild air and edema in the left hip soft tissues compatible postsurgical changes. No new fracture identified. The left hip joint is well maintained. Mild-to-moderate osteoarthrosis of the right hip joint with joint space narrowing. Left femur: Interval placement of a left femur intramedullary nail, femoral head lag screw, and distal interlocking screw fixing an acute intertrochanteric proximal femur fracture. There is improved alignment with minimal  residual anterolateral apex angulation of the fracture and displacement of fracture components. IMPRESSION: Interval placement of the left femur intramedullary nail fixing an acute intertrochanteric proximal left femur fracture with expected postsurgical changes. Electronically Signed   By: Kristine Garbe M.D.   On: 06/11/2017 21:16   Dg C-arm 61-120 Min-no Report  Result Date: 06/11/2017 Fluoroscopy was utilized by the requesting physician.  No radiographic interpretation.   Dg Femur Port Min 2 Views Left  Result Date: 06/11/2017 CLINICAL DATA:  83 y/o  F; status post left intramedullary nail. EXAM: LEFT FEMUR PORTABLE 2 VIEWS; PORTABLE PELVIS 1-2 VIEWS COMPARISON:  06/10/2017 left lower extremity radiographs. FINDINGS: Pelvis: Interval placement of a  left femur intramedullary nail, femoral head lag screw, and distal interlocking screw fixing an acute intertrochanteric proximal femur fracture. There is improved alignment with minimal residual anterolateral apex angulation of the fracture and displacement of fracture components. Mild air and edema in the left hip soft tissues compatible postsurgical changes. No new fracture identified. The left hip joint is well maintained. Mild-to-moderate osteoarthrosis of the right hip joint with joint space narrowing. Left femur: Interval placement of a left femur intramedullary nail, femoral head lag screw, and distal interlocking screw fixing an acute intertrochanteric proximal femur fracture. There is improved alignment with minimal residual anterolateral apex angulation of the fracture and displacement of fracture components. IMPRESSION: Interval placement of the left femur intramedullary nail fixing an acute intertrochanteric proximal left femur fracture with expected postsurgical changes. Electronically Signed   By: Kristine Garbe M.D.   On: 06/11/2017 21:16    Scheduled Meds . apixaban  2.5 mg Oral BID  . diltiazem  30 mg Oral BID  .  DULoxetine  60 mg Oral BID  . lidocaine  1 patch Transdermal QHS  . metoprolol tartrate  37.5 mg Oral BID  . mometasone-formoterol  2 puff Inhalation BID  . pantoprazole  40 mg Oral Daily  . senna-docusate  1 tablet Oral BID  . sodium chloride flush  3 mL Intravenous Q12H   Continuous Infusions: . sodium chloride 50 mL/hr at 06/12/17 2000  . methocarbamol (ROBAXIN)  IV Stopped (06/11/17 2112)     LOS: 3 days   Time spent: 25 minutes  Faye Ramsay, MD [facility] Pager [phone]  If 7PM-7AM, please contact night-coverage [email or web address] Password [facility] 06/13/2017, 11:43 AM

## 2017-06-13 NOTE — Care Management Important Message (Signed)
Important Message  Patient Details  Name: Sara Garcia MRN: [id number] Date of Birth: [birth year]   Medicare Important Message Given:  Yes    Kerin Salen 06/13/2017, 10:31 AMImportant Message  Patient Details  Name: Sara Garcia MRN: [id number] Date of Birth: [birth year]   Medicare Important Message Given:  Yes    Kerin Salen 06/13/2017, 10:30 AM

## 2017-06-14 LAB — BASIC METABOLIC PANEL
Anion gap: 5 (ref 5–15)
BUN: 21 mg/dL — ABNORMAL HIGH (ref 6–20)
CO2: 26 mmol/L (ref 22–32)
Calcium: 8.3 mg/dL — ABNORMAL LOW (ref 8.9–10.3)
Chloride: 106 mmol/L (ref 101–111)
Creatinine, Ser: 0.66 mg/dL (ref 0.44–1.00)
GFR calc Af Amer: >60 mL/min (ref >60)
GFR calc non Af Amer: >60 mL/min (ref >60)
Glucose, Bld: 123 mg/dL — ABNORMAL HIGH (ref 65–99)
Potassium: 5.1 mmol/L (ref 3.5–5.1)
Sodium: 137 mmol/L (ref 135–145)

## 2017-06-14 LAB — CBC
HCT: 27.6 % — ABNORMAL LOW (ref 36.0–46.0)
Hemoglobin: 9 g/dL — ABNORMAL LOW (ref 12.0–15.0)
MCH: 29.4 pg (ref 26.0–34.0)
MCHC: 32.6 g/dL (ref 30.0–36.0)
MCV: 90.2 fL (ref 78.0–100.0)
Platelets: 240 10*3/uL (ref 150–400)
RBC: 3.06 MIL/uL — ABNORMAL LOW (ref 3.87–5.11)
RDW: 15.3 % (ref 11.5–15.5)
WBC: 10.6 10*3/uL — ABNORMAL HIGH (ref 4.0–10.5)

## 2017-06-14 LAB — GLUCOSE, CAPILLARY
Glucose-Capillary: 89 mg/dL (ref 65–99)
Glucose-Capillary: 139 mg/dL — ABNORMAL HIGH (ref 65–99)

## 2017-06-14 NOTE — Progress Notes (Signed)
Patient ID: Sara Garcia, female   DOB: 01/05/1928, 83 y.o.   MRN: [id number]    PROGRESS NOTE  Sara Garcia  WLN:[id number] DOB: 09-Jan-1928 DOA: 06/10/2017  PCP: Doree Albee, MD   Brief Narrative:  83 y.o. female with medical history significant of atrial fibrillation on Eliquis, previous history of DVT, essential hypertension, osteoporosis, GERD who presented after she rolled out of bed the morning of the admission. She landed on her left hip and denids hitting her head or losing consciousness. She was not able to get off the floor until the paramedics arrived.   ED Course: Imaging in the emergency department revealed intertrochanteric left hip fracture with varus angulation. Orthopedic surgery consulted. Eliquis has been on hold.   Assessment & Plan:   Left hip fracture after fall - s/p left hip repair, post op day #3 - PT eval done, SNF recommended, awaiting for insurance authorization  - pt still reports intermittent spasms  - analgesia as needed  - continue with incentive spirometry  - Eliquis for DVT prophylaxis   Leukocytosis - still trending up but no clear infectious source identified - WBC is now trending down - will repeat CBC in AM  Post op drop in Hg - no indication for transfusion at this time - hg overall stable - no evidence of active bleeding at this time   Paroxysmal atrial fibrillation - CHADS2Vasc 4 - HR in 110's this am - continue Cardizem and Metoprolol  - continue Eliquis   HTN - reasonable inpatient control for now   Depression - Continue Cymbalta  GERD - Continue PPI  DVT prophylaxis: Eliquis Code Status: DNR Family Communication: daughter at bedside  Disposition Plan: SNF when authorization completed   Consultants:   Ortho  Procedures:   L hip repair 8/29   Antimicrobials:   Pre op   Subjective: Pt reports persistent spasms in the left lower extremity but overall better.   Objective: Vitals:   06/13/17 1506 06/13/17  2222 06/14/17 0539 06/14/17 0807  BP: (!) 136/44 (!) 121/48 (!) 145/71   Pulse: 66 72 (!) 112   Resp: 20 18 16    Temp: 97.8 F (36.6 C) 97.6 F (36.4 C) 98.5 F (36.9 C)   TempSrc: Oral Oral Oral   SpO2: 93% 92% 91% 91%  Weight:      Height:        Intake/Output Summary (Last 24 hours) at 06/14/17 1121 Last data filed at 06/14/17 0700  Gross per 24 hour  Intake           563.83 ml  Output              580 ml  Net           -16.17 ml   Filed Weights   06/12/17 2033  Weight: 71.4 kg (157 lb 6.5 oz)    Physical Exam  Constitutional: Appears calm, NAD CVS: tachycardic, no murmurs, no gallops, no carotid bruit.  Pulmonary: Effort and breath sounds normal, no stridor, diminished breath sounds at bases  Abdominal: Soft. BS +,  no distension, tenderness, rebound or guarding.  Musculoskeletal: TTP in left hip area.  Neuro: Alert. Normal reflexes, muscle tone coordination. No cranial nerve deficit.   Data Reviewed: I have personally reviewed following labs and imaging studies  CBC:  Recent Labs Lab 06/10/17 0655 06/11/17 0402 06/12/17 0358 06/13/17 0422 06/14/17 0510  WBC 14.3* 12.3* 12.4* 14.5* 10.6*  NEUTROABS 10.7*  --   --   --   --  HGB 11.6* 11.0* 10.4* 9.1* 9.0*  HCT 35.7* 35.0* 31.4* 27.5* 27.6*  MCV 87.5 89.7 87.7 90.5 90.2  PLT 224 242 213 205 035   Basic Metabolic Panel:  Recent Labs Lab 06/10/17 0655 06/11/17 0402 06/12/17 0427 06/13/17 0611 06/14/17 0510  NA 143 139 139 138 137  K 4.7 3.5 4.0 4.0 5.1  CL 114* 107 105 107 106  CO2 21* 22 25 24 26   GLUCOSE 124* 147* 141* 131* 123*  BUN 26* 23* 26* 26* 21*  CREATININE 0.81 0.90 0.84 0.85 0.66  CALCIUM 7.5* 8.3* 8.0* 8.2* 8.3*   Coagulation Profile:  Recent Labs Lab 06/10/17 0655  INR 1.27   CBG:  Recent Labs Lab 06/11/17 0644 06/11/17 1117 06/12/17 0809 06/13/17 0719 06/14/17 0729  GLUCAP 128* 113* 136* 123* 89   Urine analysis:    Component Value Date/Time   COLORURINE  STRAW (A) 06/10/2017 0642   APPEARANCEUR CLEAR 06/10/2017 0642   LABSPEC 1.020 06/10/2017 0642   PHURINE 6.5 06/10/2017 0642   GLUCOSEU NEGATIVE 06/10/2017 0642   HGBUR NEGATIVE 06/10/2017 0642   BILIRUBINUR NEGATIVE 06/10/2017 0642   KETONESUR NEGATIVE 06/10/2017 0642   PROTEINUR NEGATIVE 06/10/2017 0642   UROBILINOGEN 0.2 10/20/2013 1434   NITRITE NEGATIVE 06/10/2017 0642   LEUKOCYTESUR NEGATIVE 06/10/2017 0093   Recent Results (from the past 240 hour(s))  Surgical PCR screen     Status: Abnormal   Collection Time: 06/10/17  4:33 PM  Result Value Ref Range Status   MRSA, PCR NEGATIVE NEGATIVE Final   Staphylococcus aureus POSITIVE (A) NEGATIVE Final    Comment: (NOTE) The Xpert SA Assay (FDA approved for NASAL specimens in patients 37 years of age and older), is one component of a comprehensive surveillance program. It is not intended to diagnose infection nor to guide or monitor treatment.   Urine Culture     Status: Abnormal   Collection Time: 06/11/17  6:22 PM  Result Value Ref Range Status   Specimen Description URINE, CATHETERIZED  Final   Special Requests NONE  Final   Culture MULTIPLE SPECIES PRESENT, SUGGEST RECOLLECTION (A)  Final   Report Status 06/13/2017 FINAL  Final    Radiology Studies: No results found.  Scheduled Meds . apixaban  2.5 mg Oral BID  . diltiazem  30 mg Oral BID  . DULoxetine  60 mg Oral BID  . lidocaine  1 patch Transdermal QHS  . metoprolol tartrate  37.5 mg Oral BID  . mometasone-formoterol  2 puff Inhalation BID  . pantoprazole  40 mg Oral Daily  . senna-docusate  1 tablet Oral BID  . sodium chloride flush  3 mL Intravenous Q12H   Continuous Infusions: . sodium chloride 10 mL/hr at 06/13/17 1103  . methocarbamol (ROBAXIN)  IV Stopped (06/11/17 2112)     LOS: 4 days   Time spent: 25 minutes  Faye Ramsay, MD [facility] Pager [phone]  If 7PM-7AM, please contact night-coverage [email or web address] Password  [email or web address] 06/14/2017, 11:21 AM

## 2017-06-14 NOTE — Progress Notes (Signed)
Physical Therapy Treatment Patient Details Name: Sara Garcia MRN: [id number] DOB: 01/18/1928 Today's Date: 06/14/2017    History of Present Illness 83 yo female admitted with closed left hip fracture. S/P Im nail L femur 06/11/17. Hx of falls, Afib, DVT, osteoporosis, HTN    PT Comments    POD # 3 Pt present with increased thigh pain "cramping" and not able to take any more pain meds at this time.  Pt also c/o lower ABD pain which comes in waves.  Reported to RN.  Assisted to EOB Max/Total Assist using bed pad to complete scooting.  Pt assist < 25%.  Once EOB, pt able to static sit Indep x 7 min to adjust.   Unable to attempt sit to stand transfer this session due to pt pain and anxiety level, performed a lateral scoot from elevated bed to drop arm recliner.  Positioned in recliner and applied ICE.    Follow Up Recommendations  SNF [facility])     Equipment Recommendations       Recommendations for Other Services       Precautions / Restrictions Precautions Precautions: Fall Precaution Comments: DO NOT TOUCH HER FEET Restrictions Weight Bearing Restrictions: Yes LLE Weight Bearing: Partial weight bearing LLE Partial Weight Bearing Percentage or Pounds: 30%    Mobility  Bed Mobility Overal bed mobility: Needs Assistance Bed Mobility: Supine to Sit     Supine to sit: Max assist     General bed mobility comments: HOB elevated and use of bed pad to complete scooting to EOB.  Max grimacing and anxiety.  Once upright, pt was able to static sit Indep x 7 min.    Transfers Overall transfer level: Needs assistance Equipment used: None Transfers: Lateral/Scoot Transfers          Lateral/Scoot Transfers: Max assist;Total assist;+2 physical assistance;+2 safety/equipment;From elevated surface General transfer comment: unable to attempt sit to stand this session due to increased c/o pain and "cramping".   + 2 assist to scoot from elevated bed to drop arm recliner.      Ambulation/Gait             General Gait Details: unable   Stairs            Wheelchair Mobility    Modified Rankin (Stroke Patients Only)       Balance                                            Cognition Arousal/Alertness: Awake/alert Behavior During Therapy: WFL for tasks assessed/performed Overall Cognitive Status: Within Functional Limits for tasks assessed                                 General Comments: anxiety/fear of pain/nervous      Exercises      General Comments        Pertinent Vitals/Pain Pain Assessment: Faces Faces Pain Scale: Hurts whole lot Pain Location: during mvt Pain Descriptors / Indicators: Aching;Sore;Sharp;Grimacing;Guarding;Operative site guarding Pain Intervention(s): Monitored during session;Premedicated before session;Repositioned;Ice applied    Home Living                      Prior Function            PT Goals (current goals can now be found in  the care plan section) Progress towards PT goals: Progressing toward goals    Frequency    Min 3X/week      PT Plan      Co-evaluation              AM-PAC PT "6 Clicks" Daily Activity  Outcome Measure  Difficulty turning over in bed (including adjusting bedclothes, sheets and blankets)?: Unable Difficulty moving from lying on back to sitting on the side of the bed? : Unable Difficulty sitting down on and standing up from a chair with arms (e.g., wheelchair, bedside commode, etc,.)?: Unable Help needed moving to and from a bed to chair (including a wheelchair)?: Total Help needed walking in hospital room?: Total Help needed climbing 3-5 steps with a railing? : Total 6 Click Score: 6    End of Session Equipment Utilized During Treatment: Gait belt Activity Tolerance: Patient limited by pain;Patient limited by fatigue;Other (comment) (anxiety) Patient left: in chair;with call bell/phone within reach;with chair  alarm set;with family/visitor present Nurse Communication: Mobility status;Need for lift equipment Pain - Right/Left: Left     Time: 6945-0388 PT Time Calculation (min) (ACUTE ONLY): 28 min  Charges:  $Therapeutic Activity: 23-37 mins                    G Codes:       Rica Koyanagi  PTA [facility]  Acute  Rehab Pager      [phone]

## 2017-06-14 NOTE — Plan of Care (Signed)
Problem: Pain Management: Goal: General experience of comfort will improve Outcome: Progressing Denies pain at this time, however states it is very painful when she has a muscle spasm. Continue with plan of care.

## 2017-06-15 LAB — URINALYSIS, ROUTINE W REFLEX MICROSCOPIC
Bilirubin Urine: NEGATIVE
Glucose, UA: NEGATIVE mg/dL
Hgb urine dipstick: NEGATIVE
Ketones, ur: NEGATIVE mg/dL
Nitrite: NEGATIVE
Protein, ur: NEGATIVE mg/dL
Specific Gravity, Urine: 1.017 (ref 1.005–1.030)
pH: 5 (ref 5.0–8.0)

## 2017-06-15 LAB — BASIC METABOLIC PANEL
Anion gap: 9 (ref 5–15)
BUN: 14 mg/dL (ref 6–20)
CO2: 25 mmol/L (ref 22–32)
Calcium: 8.1 mg/dL — ABNORMAL LOW (ref 8.9–10.3)
Chloride: 103 mmol/L (ref 101–111)
Creatinine, Ser: 0.69 mg/dL (ref 0.44–1.00)
GFR calc Af Amer: >60 mL/min (ref >60)
GFR calc non Af Amer: >60 mL/min (ref >60)
Glucose, Bld: 107 mg/dL — ABNORMAL HIGH (ref 65–99)
Potassium: 4.4 mmol/L (ref 3.5–5.1)
Sodium: 137 mmol/L (ref 135–145)

## 2017-06-15 LAB — CBC
HCT: 28.6 % — ABNORMAL LOW (ref 36.0–46.0)
Hemoglobin: 9.2 g/dL — ABNORMAL LOW (ref 12.0–15.0)
MCH: 28.6 pg (ref 26.0–34.0)
MCHC: 32.2 g/dL (ref 30.0–36.0)
MCV: 88.8 fL (ref 78.0–100.0)
Platelets: 253 10*3/uL (ref 150–400)
RBC: 3.22 MIL/uL — ABNORMAL LOW (ref 3.87–5.11)
RDW: 15.2 % (ref 11.5–15.5)
WBC: 11 10*3/uL — ABNORMAL HIGH (ref 4.0–10.5)

## 2017-06-15 LAB — GLUCOSE, CAPILLARY: Glucose-Capillary: 101 mg/dL — ABNORMAL HIGH (ref 65–99)

## 2017-06-15 MED ORDER — APIXABAN 5 MG PO TABS
5.0000 mg | ORAL_TABLET | Freq: Two times a day (BID) | ORAL | Status: DC
  Administered 2017-06-15 – 2017-06-18 (×6): 5 mg via ORAL
  Filled 2017-06-15 (×6): qty 1

## 2017-06-15 MED ORDER — NITROFURANTOIN MONOHYD MACRO 100 MG PO CAPS
100.0000 mg | ORAL_CAPSULE | Freq: Two times a day (BID) | ORAL | Status: DC
  Administered 2017-06-15 – 2017-06-18 (×7): 100 mg via ORAL
  Filled 2017-06-15 (×7): qty 1

## 2017-06-15 NOTE — Progress Notes (Signed)
Patient was started on antibiotics today for a UTI. Pt was turned every 2 hours post left hip surgery. Continue with plan of care.

## 2017-06-15 NOTE — Plan of Care (Signed)
Problem: Pain Management: Goal: Pain level will decrease Outcome: Progressing Pain only with spasms and movement.   Continue with Norco and Robaxin prn.   Problem: Bowel/Gastric: Goal: Gastrointestinal status for postoperative course will improve Outcome: Completed/Met Date Met: 06/15/17 Large BM x2 yesterday.   Continue with plan of care and continue to monitor.

## 2017-06-15 NOTE — Progress Notes (Signed)
Patient ID: Sara Garcia, female   DOB: 1928-02-19, 83 y.o.   MRN: [id number]    PROGRESS NOTE  Sara Garcia  VQQ:[id number] DOB: 1928-02-13 DOA: 06/10/2017  PCP: Doree Albee, MD   Brief Narrative:  83 y.o. female with medical history significant of atrial fibrillation on Eliquis, previous history of DVT, essential hypertension, osteoporosis, GERD who presented after she rolled out of bed the morning of the admission. She landed on her left hip and denids hitting her head or losing consciousness. She was not able to get off the floor until the paramedics arrived.   ED Course: Imaging in the emergency department revealed intertrochanteric left hip fracture with varus angulation. Orthopedic surgery consulted. Eliquis has been on hold.   Assessment & Plan:   Left hip fracture after fall - s/p left hip repair, post op day #4 - PT eval done, SNF recommended, awaiting for insurance authorization  - still with intermittent spasms  - treat with analgesia as needed - IS while awake - Eliquis for DVT prophylaxis   Leukocytosis - UA suggestive of UTI, will place on Macrobid - follow up on urine cutlures   Post op drop in Hg - no indication for transfusion at this time - no evidence of active bleeding - CBC in AM   Paroxysmal atrial fibrillation - CHADS2Vasc 4 - HR in 110's this am - continue Cardizem and Metoprolol  - Eliquis   HTN - reasonable control   Depression - Continue Cymbalta  GERD - Continue PPI  DVT prophylaxis: Eliquis Code Status: DNR Family Communication: no family at bedside  Disposition Plan: SNF when authorization completed   Consultants:   Ortho  Procedures:   L hip repair 8/29   Antimicrobials:   Pre op   Macrobid for UTI 9/2 -->  Subjective: Pt reports feeling better but still with spasms.   Objective: Vitals:   06/15/17 0644 06/15/17 0724 06/15/17 1023 06/15/17 1055  BP: 97/82 (!) 123/48 122/64   Pulse: 69 71  (!) 115  Resp: 17      Temp: 98 F (36.7 C) 98.5 F (36.9 C)    TempSrc: Oral Oral    SpO2: 92% 93%    Weight:      Height:        Intake/Output Summary (Last 24 hours) at 06/15/17 1224 Last data filed at 06/15/17 0724  Gross per 24 hour  Intake           438.17 ml  Output             2150 ml  Net         -1711.83 ml   Filed Weights   06/12/17 2033  Weight: 71.4 kg (157 lb 6.5 oz)    Physical Exam  Constitutional: Appears calm and pleasant, NAD CVS: Tachycardic, no gallops, no carotid bruit.  Pulmonary: Effort and breath sounds normal, no stridor Abdominal: Soft. BS +,  no distension, tenderness, rebound or guarding.  Musculoskeletal: Normal range of motion. No edema and no tenderness.   Data Reviewed: I have personally reviewed following labs and imaging studies  CBC:  Recent Labs Lab 06/10/17 0655 06/11/17 0402 06/12/17 0358 06/13/17 0422 06/14/17 0510 06/15/17 0446  WBC 14.3* 12.3* 12.4* 14.5* 10.6* 11.0*  NEUTROABS 10.7*  --   --   --   --   --   HGB 11.6* 11.0* 10.4* 9.1* 9.0* 9.2*  HCT 35.7* 35.0* 31.4* 27.5* 27.6* 28.6*  MCV 87.5 89.7 87.7 90.5  90.2 88.8  PLT 224 242 213 205 240 283   Basic Metabolic Panel:  Recent Labs Lab 06/11/17 0402 06/12/17 0427 06/13/17 0611 06/14/17 0510 06/15/17 0446  NA 139 139 138 137 137  K 3.5 4.0 4.0 5.1 4.4  CL 107 105 107 106 103  CO2 22 25 24 26 25   GLUCOSE 147* 141* 131* 123* 107*  BUN 23* 26* 26* 21* 14  CREATININE 0.90 0.84 0.85 0.66 0.69  CALCIUM 8.3* 8.0* 8.2* 8.3* 8.1*   Coagulation Profile:  Recent Labs Lab 06/10/17 0655  INR 1.27   CBG:  Recent Labs Lab 06/12/17 0809 06/13/17 0719 06/14/17 0729 06/14/17 1151 06/15/17 0717  GLUCAP 136* 123* 89 139* 101*   Urine analysis:    Component Value Date/Time   COLORURINE YELLOW 06/14/2017 0659   APPEARANCEUR CLOUDY (A) 06/14/2017 0659   LABSPEC 1.017 06/14/2017 0659   PHURINE 5.0 06/14/2017 0659   GLUCOSEU NEGATIVE 06/14/2017 0659   HGBUR NEGATIVE 06/14/2017  0659   BILIRUBINUR NEGATIVE 06/14/2017 0659   KETONESUR NEGATIVE 06/14/2017 0659   PROTEINUR NEGATIVE 06/14/2017 0659   UROBILINOGEN 0.2 10/20/2013 1434   NITRITE NEGATIVE 06/14/2017 0659   LEUKOCYTESUR MODERATE (A) 06/14/2017 0659   Recent Results (from the past 240 hour(s))  Surgical PCR screen     Status: Abnormal   Collection Time: 06/10/17  4:33 PM  Result Value Ref Range Status   MRSA, PCR NEGATIVE NEGATIVE Final   Staphylococcus aureus POSITIVE (A) NEGATIVE Final    Comment: (NOTE) The Xpert SA Assay (FDA approved for NASAL specimens in patients 77 years of age and older), is one component of a comprehensive surveillance program. It is not intended to diagnose infection nor to guide or monitor treatment.   Urine Culture     Status: Abnormal   Collection Time: 06/11/17  6:22 PM  Result Value Ref Range Status   Specimen Description URINE, CATHETERIZED  Final   Special Requests NONE  Final   Culture MULTIPLE SPECIES PRESENT, SUGGEST RECOLLECTION (A)  Final   Report Status 06/13/2017 FINAL  Final  Culture, Urine     Status: Abnormal (Preliminary result)   Collection Time: 06/14/17  6:59 AM  Result Value Ref Range Status   Specimen Description URINE, CLEAN CATCH  Final   Special Requests NONE  Final   Culture 50,000 COLONIES/mL GRAM NEGATIVE RODS (A)  Final   Report Status PENDING  Incomplete    Radiology Studies: No results found.  Scheduled Meds . apixaban  5 mg Oral BID  . diltiazem  30 mg Oral BID  . DULoxetine  60 mg Oral BID  . lidocaine  1 patch Transdermal QHS  . metoprolol tartrate  37.5 mg Oral BID  . mometasone-formoterol  2 puff Inhalation BID  . nitrofurantoin (macrocrystal-monohydrate)  100 mg Oral Q12H  . pantoprazole  40 mg Oral Daily  . senna-docusate  1 tablet Oral BID  . sodium chloride flush  3 mL Intravenous Q12H   Continuous Infusions: . sodium chloride 10 mL/hr at 06/15/17 0441  . methocarbamol (ROBAXIN)  IV Stopped (06/11/17 2112)      LOS: 5 days   Time spent: 25 minutes   Faye Ramsay, MD [facility] Pager [phone]  If 7PM-7AM, please contact night-coverage [email or web address] Password [facility] 06/15/2017, 12:24 PM

## 2017-06-16 LAB — URINE CULTURE: Culture: 50000 — AB

## 2017-06-16 LAB — CBC
HCT: 30.4 % — ABNORMAL LOW (ref 36.0–46.0)
Hemoglobin: 9.9 g/dL — ABNORMAL LOW (ref 12.0–15.0)
MCH: 28.8 pg (ref 26.0–34.0)
MCHC: 32.6 g/dL (ref 30.0–36.0)
MCV: 88.4 fL (ref 78.0–100.0)
Platelets: 264 10*3/uL (ref 150–400)
RBC: 3.44 MIL/uL — ABNORMAL LOW (ref 3.87–5.11)
RDW: 15.5 % (ref 11.5–15.5)
WBC: 10.2 10*3/uL (ref 4.0–10.5)

## 2017-06-16 LAB — BASIC METABOLIC PANEL
Anion gap: 9 (ref 5–15)
BUN: 14 mg/dL (ref 6–20)
CO2: 27 mmol/L (ref 22–32)
Calcium: 8.1 mg/dL — ABNORMAL LOW (ref 8.9–10.3)
Chloride: 102 mmol/L (ref 101–111)
Creatinine, Ser: 0.62 mg/dL (ref 0.44–1.00)
GFR calc Af Amer: >60 mL/min (ref >60)
GFR calc non Af Amer: >60 mL/min (ref >60)
Glucose, Bld: 99 mg/dL (ref 65–99)
Potassium: 4.1 mmol/L (ref 3.5–5.1)
Sodium: 138 mmol/L (ref 135–145)

## 2017-06-16 LAB — GLUCOSE, CAPILLARY: Glucose-Capillary: 105 mg/dL — ABNORMAL HIGH (ref 65–99)

## 2017-06-16 MED ORDER — SENNOSIDES-DOCUSATE SODIUM 8.6-50 MG PO TABS: 1.0000 | ORAL_TABLET | Freq: Every evening | ORAL | Status: DC | PRN

## 2017-06-16 MED ORDER — NITROFURANTOIN MONOHYD MACRO 100 MG PO CAPS: 100.0000 mg | ORAL_CAPSULE | Freq: Two times a day (BID) | ORAL | 0 refills | Status: DC

## 2017-06-16 NOTE — Progress Notes (Signed)
Physical Therapy Treatment Patient Details Name: Sara Garcia MRN: [id number] DOB: December 08, 1927 Today's Date: 06/16/2017    History of Present Illness 83 yo female admitted with closed left hip fracture. S/P Im nail L femur 06/11/17. Hx of falls, Afib, DVT, osteoporosis, HTN    PT Comments    POD # 5 Assisted OOB to [facility] + 2 assist stand pivot 1/4 turn towards pts R.  Assisted from [facility] to recliner same tech.  Pt able to partially WB thru L LE 30% with much effort and instruction.  Positioned in recliner and applied ICE. Pt will need ST Rehab at SNF prior to returning home.   Follow Up Recommendations  SNF [facility])     Equipment Recommendations  None recommended by PT    Recommendations for Other Services       Precautions / Restrictions Precautions Precautions: Fall Precaution Comments: DO NOT TOUCH HER FEET Restrictions Weight Bearing Restrictions: Yes LLE Weight Bearing: Partial weight bearing LLE Partial Weight Bearing Percentage or Pounds: 30%    Mobility  Bed Mobility Overal bed mobility: Needs Assistance Bed Mobility: Supine to Sit     Supine to sit: Max assist     General bed mobility comments: HOB elevated and use of bed pad to complete scooting to EOB.  Once upright, pt was able to static sit Indep x 7 min.    Transfers Overall transfer level: Needs assistance Equipment used: None Transfers: Sit to/from [facility] Sit to Stand: Max assist;+2 safety/equipment;+2 physical assistance Stand pivot transfers: Max assist;+2 safety/equipment;+2 physical assistance       General transfer comment: assisted from elevated bed to [facility] 1/4 pivot turn towards pt R with increased time and 50% VC's on turn completion as well as hand transfer.  Assisted off BSC to recliner same tech.    Ambulation/Gait             General Gait Details: pre gait standing/pivoting transfer   Stairs            Wheelchair Mobility    Modified Rankin (Stroke  Patients Only)       Balance                                            Cognition Arousal/Alertness: Awake/alert Behavior During Therapy: WFL for tasks assessed/performed Overall Cognitive Status: Within Functional Limits for tasks assessed                                 General Comments: less anxiety this session (Ativan taken earlier)      Exercises      General Comments        Pertinent Vitals/Pain Pain Assessment: 0-10 Pain Score: 7  Pain Location: L hip Pain Descriptors / Indicators: Aching;Sore;Sharp;Grimacing;Guarding;Operative site guarding Pain Intervention(s): Monitored during session;Repositioned;Ice applied;Patient requesting pain meds-RN notified;RN gave pain meds during session    Home Living                      Prior Function            PT Goals (current goals can now be found in the care plan section) Progress towards PT goals: Progressing toward goals    Frequency    Min 3X/week      PT Plan Current  plan remains appropriate    Co-evaluation              AM-PAC PT "6 Clicks" Daily Activity  Outcome Measure  Difficulty turning over in bed (including adjusting bedclothes, sheets and blankets)?: Unable Difficulty moving from lying on back to sitting on the side of the bed? : Unable Difficulty sitting down on and standing up from a chair with arms (e.g., wheelchair, bedside commode, etc,.)?: Unable Help needed moving to and from a bed to chair (including a wheelchair)?: Total Help needed walking in hospital room?: Total Help needed climbing 3-5 steps with a railing? : Total 6 Click Score: 6    End of Session Equipment Utilized During Treatment: Gait belt Activity Tolerance: Patient tolerated treatment well Patient left: in chair;with call bell/phone within reach;with chair alarm set;with family/visitor present Nurse Communication: Mobility status;Need for lift equipment PT Visit Diagnosis:  Pain Pain - Right/Left: Left Pain - part of body: Leg     Time: 7915-0569 PT Time Calculation (min) (ACUTE ONLY): 40 min  Charges:  $Gait Training: 8-22 mins $Therapeutic Activity: 23-37 mins                    G Codes:       Rica Koyanagi  PTA [facility]  Acute  Rehab Pager      [phone]

## 2017-06-16 NOTE — Progress Notes (Signed)
CSW contacted [facility] [employer] [facility]) SNF and inquired about patient's [employer] authorization. CSW spoke with staff member Mardene Celeste who reported that authorization has been started but has not been received, she reported that she anticipates hearing something tomorrow and agreed to notify CSW with any updates. CSW continuing to follow to assist with discharge planning to SNF.  Abundio Miu, [facility]  Clinical Social Worker [phone]

## 2017-06-16 NOTE — Progress Notes (Signed)
Pt resting in chair, eating dinner, assessment unchanged, cont to plan of care. Pt requesting pain med when time, pt hip starting to hurt. Will cont to monitor and administer when appropriate. SRP, RN

## 2017-06-16 NOTE — Discharge Summary (Signed)
Physician Discharge Summary  Sara Garcia FAO:[id number] DOB: Apr 27, 1928 DOA: 06/10/2017  PCP: Doree Albee, MD  Admit date: 06/10/2017 Discharge date: 06/16/2017  Recommendations for Outpatient Follow-up:  1. Pt will need to follow up with PCP in 2-3 weeks post discharge 2. Please obtain BMP to evaluate electrolytes and kidney function 3. Please also check CBC to evaluate Hg and Hct levels 4. Macrobid for 4 more days   Discharge Diagnoses:  Principal Problem:   Closed left hip fracture ([place]) Active Problems:   Essential hypertension   Atrial fibrillation (HCC)   Closed comminuted intertrochanteric fracture of left femur (HCC)    Discharge Condition: Stable  Diet recommendation: Heart healthy diet discussed in details   History of present illness:  Brief Narrative:  83 y.o.femalewith medical history significant of atrial fibrillation on Eliquis, previous history of DVT, essential hypertension, osteoporosis, GERD who presented after she rolled out of bed the morning of the admission. She landed on her left hip and denids hitting her head or losing consciousness. She was not able to get off the floor until the paramedics arrived.   ED Course:Imaging in the emergency department revealed intertrochanteric left hip fracture with varus angulation. Orthopedic surgery consulted. Eliquis has been on hold.   Assessment & Plan:   Left hip fracture after fall - s/p left hip repair, post op day #5 - PT eval done, SNF recommended, awaiting for insurance authorization  - still with intermittent spasms  - IS while awake - Eliquis for DVT prophylaxis   Leukocytosis - urine culture positive for E. Coli - pt was started on Nitrofurantoin and per sensitivity report this should be adequate   Post op drop in Hg - no indication for transfusion at this time - no evidence of active bleeding  - CBC in AM  Paroxysmal atrial fibrillation - CHADS2Vasc 4 - HR in 110's this am - cont  Cardizem and Metoprolol   HTN - reasonable control   Depression - Continue Cymbalta  GERD - Continue PPI  DVT prophylaxis: Eliquis Code Status: DNR Family Communication: daughter over the phone  Disposition Plan: SNF in am  Consultants:   Ortho  Procedures:   L hip repair 8/29   Antimicrobials:   Pre op   Macrobid for UTI 9/2 -->  Procedures/Studies: Dg Chest 1 View  Result Date: 06/10/2017 CLINICAL DATA:  Left hip fracture.  Preop imaging. EXAM: CHEST 1 VIEW COMPARISON:  12/21/2016 FINDINGS: No airspace consolidation. No pneumothorax. No effusion. Mild vascular prominence, likely due to supine positioning. Unchanged hilar, mediastinal and cardiac contours. IMPRESSION: No consolidation.  No large effusion. Electronically Signed   By: Andreas Newport M.D.   On: 06/10/2017 06:54   Dg Thoracic Spine 2 View  Result Date: 06/10/2017 CLINICAL DATA:  Presumed unwitnessed fall. Found on floor by family this morning. EXAM: THORACIC SPINE 2 VIEWS COMPARISON:  Chest CT 09/11/2016 FINDINGS: Unchanged kyphoscoliosis. The thoracic vertebrae are normal in height. No evidence of acute thoracic spine fracture. Moderately severe thoracic degenerative disc changes, with large osteophytes at the right lateral aspect of the vertebral column. No bone lesion or bony destruction. IMPRESSION: Kyphoscoliosis and moderate degenerative changes. No evidence of acute thoracic spine fracture. Electronically Signed   By: Andreas Newport M.D.   On: 06/10/2017 06:50   Dg Lumbar Spine Complete  Result Date: 06/10/2017 CLINICAL DATA:  Presumed unwitnessed fall. Found on floor by family this morning. EXAM: LUMBAR SPINE - COMPLETE 4+ VIEW COMPARISON:  CT 09/11/2015 FINDINGS:  Unchanged thoracolumbar scoliosis. No evidence of acute lumbar spine fracture. Moderately severe lumbar degenerative disc and facet changes. Sacroiliac joints are grossly intact. IMPRESSION: Thoracolumbar scoliosis. Degenerative  disc and facet changes. Negative for acute lumbar spine fracture. Electronically Signed   By: Andreas Newport M.D.   On: 06/10/2017 06:51   Ct Head Wo Contrast  Result Date: 06/10/2017 CLINICAL DATA:  Head trauma, fall from bed EXAM: CT HEAD WITHOUT CONTRAST CT CERVICAL SPINE WITHOUT CONTRAST TECHNIQUE: Multidetector CT imaging of the head and cervical spine was performed following the standard protocol without intravenous contrast. Multiplanar CT image reconstructions of the cervical spine were also generated. COMPARISON:  Head CT 10/20/2013 FINDINGS: CT HEAD FINDINGS Brain: No mass lesion, intraparenchymal hemorrhage or extra-axial collection. No evidence of acute cortical infarct. There is periventricular hypoattenuation compatible with chronic microvascular disease. Vascular: No hyperdense vessel or unexpected calcification. Skull: Normal visualized skull base, calvarium and extracranial soft tissues. Sinuses/Orbits: No sinus fluid levels or advanced mucosal thickening. No mastoid effusion. Normal orbits. CT CERVICAL SPINE FINDINGS Alignment: Reversal of the normal cervical lordosis without static subluxation. Facets are aligned. Occipital condyles are normally positioned. Skull base and vertebrae: No acute fracture. Soft tissues and spinal canal: No prevertebral fluid or swelling. No visible canal hematoma. Disc levels: No bony spinal canal stenosis. Fused left C2-C3 facets. Multilevel foraminal stenosis. Upper chest: Ground-glass opacities and septal thickening, possibly pulmonary edema. Other: Normal visualized paraspinal cervical soft tissues. IMPRESSION: 1. 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 2. No acute fracture or static subluxation of the cervical spine. 3. Biapical groundglass opacity and septal thickening, likely pulmonary edema. Correlate with chest radiograph. Electronically Signed   By: Ulyses Jarred M.D.   On: 06/10/2017 06:41   Ct Cervical Spine Wo  Contrast  Result Date: 06/10/2017 CLINICAL DATA:  Head trauma, fall from bed EXAM: CT HEAD WITHOUT CONTRAST CT CERVICAL SPINE WITHOUT CONTRAST TECHNIQUE: Multidetector CT imaging of the head and cervical spine was performed following the standard protocol without intravenous contrast. Multiplanar CT image reconstructions of the cervical spine were also generated. COMPARISON:  Head CT 10/20/2013 FINDINGS: CT HEAD FINDINGS Brain: No mass lesion, intraparenchymal hemorrhage or extra-axial collection. No evidence of acute cortical infarct. There is periventricular hypoattenuation compatible with chronic microvascular disease. Vascular: No hyperdense vessel or unexpected calcification. Skull: Normal visualized skull base, calvarium and extracranial soft tissues. Sinuses/Orbits: No sinus fluid levels or advanced mucosal thickening. No mastoid effusion. Normal orbits. CT CERVICAL SPINE FINDINGS Alignment: Reversal of the normal cervical lordosis without static subluxation. Facets are aligned. Occipital condyles are normally positioned. Skull base and vertebrae: No acute fracture. Soft tissues and spinal canal: No prevertebral fluid or swelling. No visible canal hematoma. Disc levels: No bony spinal canal stenosis. Fused left C2-C3 facets. Multilevel foraminal stenosis. Upper chest: Ground-glass opacities and septal thickening, possibly pulmonary edema. Other: Normal visualized paraspinal cervical soft tissues. IMPRESSION: 1. 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 2. No acute fracture or static subluxation of the cervical spine. 3. Biapical groundglass opacity and septal thickening, likely pulmonary edema. Correlate with chest radiograph. Electronically Signed   By: Ulyses Jarred M.D.   On: 06/10/2017 06:41   Pelvis Portable  Result Date: 06/11/2017 CLINICAL DATA:  83 y/o  F; status post left intramedullary nail. EXAM: LEFT FEMUR PORTABLE 2 VIEWS; PORTABLE PELVIS 1-2 VIEWS  COMPARISON:  06/10/2017 left lower extremity radiographs. FINDINGS: Pelvis: Interval placement of a left femur intramedullary nail, femoral head lag screw, and distal interlocking screw fixing an  acute intertrochanteric proximal femur fracture. There is improved alignment with minimal residual anterolateral apex angulation of the fracture and displacement of fracture components. Mild air and edema in the left hip soft tissues compatible postsurgical changes. No new fracture identified. The left hip joint is well maintained. Mild-to-moderate osteoarthrosis of the right hip joint with joint space narrowing. Left femur: Interval placement of a left femur intramedullary nail, femoral head lag screw, and distal interlocking screw fixing an acute intertrochanteric proximal femur fracture. There is improved alignment with minimal residual anterolateral apex angulation of the fracture and displacement of fracture components. IMPRESSION: Interval placement of the left femur intramedullary nail fixing an acute intertrochanteric proximal left femur fracture with expected postsurgical changes. Electronically Signed   By: Kristine Garbe M.D.   On: 06/11/2017 21:16   Dg C-arm 61-120 Min-no Report  Result Date: 06/11/2017 Fluoroscopy was utilized by the requesting physician.  No radiographic interpretation.   Dg Hip Unilat W Or Wo Pelvis 2-3 Views Left  Result Date: 06/10/2017 CLINICAL DATA:  Presumed unwitnessed fall. Found on floor by family this morning EXAM: DG HIP (WITH OR WITHOUT PELVIS) 2-3V LEFT COMPARISON:  None. FINDINGS: There is an acute mildly comminuted intertrochanteric left hip fracture with varus angulation. No dislocation. Bony pelvis appears intact. Pubic symphysis and sacroiliac joints appear intact. Moderate right hip arthritis incidentally noted. IMPRESSION: Intertrochanteric left hip fracture with varus angulation. Electronically Signed   By: Andreas Newport M.D.   On: 06/10/2017 06:52    Dg Femur Port Min 2 Views Left  Result Date: 06/11/2017 CLINICAL DATA:  83 y/o  F; status post left intramedullary nail. EXAM: LEFT FEMUR PORTABLE 2 VIEWS; PORTABLE PELVIS 1-2 VIEWS COMPARISON:  06/10/2017 left lower extremity radiographs. FINDINGS: Pelvis: Interval placement of a left femur intramedullary nail, femoral head lag screw, and distal interlocking screw fixing an acute intertrochanteric proximal femur fracture. There is improved alignment with minimal residual anterolateral apex angulation of the fracture and displacement of fracture components. Mild air and edema in the left hip soft tissues compatible postsurgical changes. No new fracture identified. The left hip joint is well maintained. Mild-to-moderate osteoarthrosis of the right hip joint with joint space narrowing. Left femur: Interval placement of a left femur intramedullary nail, femoral head lag screw, and distal interlocking screw fixing an acute intertrochanteric proximal femur fracture. There is improved alignment with minimal residual anterolateral apex angulation of the fracture and displacement of fracture components. IMPRESSION: Interval placement of the left femur intramedullary nail fixing an acute intertrochanteric proximal left femur fracture with expected postsurgical changes. Electronically Signed   By: Kristine Garbe M.D.   On: 06/11/2017 21:16   Dg Knee Ap/lat W/sunrise Right  Result Date: 05/27/2017 Radiology reading dictated by Dr. Aline Brochure 3 views right knee for right knee pain Tibiofemoral alignment is neutral Symmetric joint space narrowing medial lateral compartment subchondral sclerosis joint space narrowing is considered greater than 75% of normal with Patellofemoral alignment is normal Impression moderate to severe osteoarthritis right knee    Discharge Exam: Vitals:   06/16/17 0902 06/16/17 1116  BP:  (!) 144/62  Pulse:  76  Resp:    Temp:    SpO2: 95%    Vitals:   06/15/17 2151  06/16/17 0415 06/16/17 0902 06/16/17 1116  BP: 140/64 139/60  (!) 144/62  Pulse: (!) 121 65  76  Resp: 18 18    Temp: 98.4 F (36.9 C) 98.4 F (36.9 C)    TempSrc: Oral Oral    SpO2:  94% 99% 95%   Weight:      Height:        General: Pt is alert, follows commands appropriately, not in acute distress Cardiovascular: Regular rate and rhythm, S1/S2 +, no murmurs, no rubs, no gallops Respiratory: Clear to auscultation bilaterally, no wheezing, no crackles, no rhonchi Abdominal: Soft, non tender, non distended, bowel sounds +, no guarding   Discharge Instructions   Allergies as of 06/16/2017      Reactions   Latex Itching, Rash   Adhesive [tape] Other (See Comments)   Tears skin   Betadine [povidone Iodine]    blisters   Ciprofloxacin Nausea And Vomiting   Codeine Nausea And Vomiting   Upsets stomach, nightmares   Penicillins Hives   Has patient had a PCN reaction causing immediate rash, facial/tongue/throat swelling, SOB or lightheadedness with hypotension: yes Has patient had a PCN reaction causing severe rash involving mucus membranes or skin necrosis: yes Has patient had a PCN reaction that required hospitalization: no Has patient had a PCN reaction occurring within the last 10 years: no If all of the above answers are "NO", then may proceed with Cephalosporin use.   Sulfa Antibiotics Hives      Medication List    TAKE these medications   acetaminophen 500 MG tablet Commonly known as:  TYLENOL Take 1,000 mg by mouth 2 (two) times daily as needed for mild pain, moderate pain or headache.   apixaban 5 MG Tabs tablet Commonly known as:  ELIQUIS Take 1 tablet (5 mg total) by mouth 2 (two) times daily.   calcium-vitamin D 500-200 MG-UNIT tablet Take 1 tablet by mouth daily.   cyanocobalamin 2000 MCG tablet Take 2,000 mcg by mouth daily.   diltiazem 30 MG tablet Commonly known as:  CARDIZEM Take 1 tablet (30 mg total) by mouth 2 (two) times daily.   DULoxetine 60  MG capsule Commonly known as:  CYMBALTA Take 60 mg by mouth 2 (two) times daily.   esomeprazole 20 MG capsule Commonly known as:  NEXIUM Take 1 capsule (20 mg total) by mouth daily at 12 noon.   Fluticasone-Salmeterol 250-50 MCG/DOSE Aepb Commonly known as:  ADVAIR Inhale 1 puff into the lungs daily.   HYDROcodone-acetaminophen 5-325 MG tablet Commonly known as:  NORCO/VICODIN Take 1-2 tablets by mouth every 6 (six) hours as needed for moderate pain.   metoprolol tartrate 25 MG tablet Commonly known as:  LOPRESSOR Take 1.5 tablets (37.5 mg total) by mouth 2 (two) times daily.   nitrofurantoin (macrocrystal-monohydrate) 100 MG capsule Commonly known as:  MACROBID Take 1 capsule (100 mg total) by mouth every 12 (twelve) hours.   polyethylene glycol powder powder Commonly known as:  GLYCOLAX/MIRALAX MIX 17 GRAMS WITH LIQUID AND TAKE BY MOUTH ONCE DAILY What changed:  See the new instructions.   pyridOXINE 100 MG tablet Commonly known as:  VITAMIN B-6 Take 100 mg by mouth daily.   senna-docusate 8.6-50 MG tablet Commonly known as:  Senokot-S Take 1 tablet by mouth at bedtime as needed for mild constipation.            Discharge Care Instructions        Start     Ordered   06/16/17 0000  nitrofurantoin, macrocrystal-monohydrate, (MACROBID) 100 MG capsule  Every 12 hours     06/16/17 1212   06/16/17 0000  senna-docusate (SENOKOT-S) 8.6-50 MG tablet  At bedtime PRN     06/16/17 1212   06/13/17 0000  HYDROcodone-acetaminophen (NORCO/VICODIN) 5-325 MG  tablet  Every 6 hours PRN     06/13/17 8413     Follow-up Information    Swinteck, Aaron Edelman, MD. Schedule an appointment as soon as possible for a visit in 2 week(s).   Specialty:  Orthopedic Surgery Why:  For wound re-check Contact information: [address] [place] [address] [phone]        Doree Albee, MD Follow up.   Specialty:  Internal Medicine Contact information: [place] [address] [phone]            The results of significant diagnostics from this hospitalization (including imaging, microbiology, ancillary and laboratory) are listed below for reference.     Microbiology: Recent Results (from the past 240 hour(s))  Surgical PCR screen     Status: Abnormal   Collection Time: 06/10/17  4:33 PM  Result Value Ref Range Status   MRSA, PCR NEGATIVE NEGATIVE Final   Staphylococcus aureus POSITIVE (A) NEGATIVE Final    Comment: (NOTE) The Xpert SA Assay (FDA approved for NASAL specimens in patients 36 years of age and older), is one component of a comprehensive surveillance program. It is not intended to diagnose infection nor to guide or monitor treatment.   Urine Culture     Status: Abnormal   Collection Time: 06/11/17  6:22 PM  Result Value Ref Range Status   Specimen Description URINE, CATHETERIZED  Final   Special Requests NONE  Final   Culture MULTIPLE SPECIES PRESENT, SUGGEST RECOLLECTION (A)  Final   Report Status 06/13/2017 FINAL  Final  Culture, Urine     Status: Abnormal   Collection Time: 06/14/17  6:59 AM  Result Value Ref Range Status   Specimen Description URINE, CLEAN CATCH  Final   Special Requests NONE  Final   Culture 50,000 COLONIES/mL ESCHERICHIA COLI (A)  Final   Report Status 06/16/2017 FINAL  Final   Organism ID, Bacteria ESCHERICHIA COLI (A)  Final      Susceptibility   Escherichia coli - MIC*    AMPICILLIN <=2 SENSITIVE Sensitive     CEFAZOLIN <=4 SENSITIVE Sensitive     CEFTRIAXONE <=1 SENSITIVE Sensitive     CIPROFLOXACIN <=0.25 SENSITIVE Sensitive     GENTAMICIN <=1 SENSITIVE Sensitive     IMIPENEM <=0.25 SENSITIVE Sensitive     NITROFURANTOIN <=16 SENSITIVE Sensitive     TRIMETH/SULFA <=20 SENSITIVE Sensitive     AMPICILLIN/SULBACTAM <=2 SENSITIVE Sensitive     PIP/TAZO <=4 SENSITIVE Sensitive     Extended ESBL NEGATIVE Sensitive     * 50,000 COLONIES/mL ESCHERICHIA COLI     Labs: Basic  Metabolic Panel:  Recent Labs Lab 06/12/17 0427 06/13/17 0611 06/14/17 0510 06/15/17 0446 06/16/17 0540  NA 139 138 137 137 138  K 4.0 4.0 5.1 4.4 4.1  CL 105 107 106 103 102  CO2 25 24 26 25 27   GLUCOSE 141* 131* 123* 107* 99  BUN 26* 26* 21* 14 14  CREATININE 0.84 0.85 0.66 0.69 0.62  CALCIUM 8.0* 8.2* 8.3* 8.1* 8.1*   CBC:  Recent Labs Lab 06/10/17 0655  06/12/17 0358 06/13/17 0422 06/14/17 0510 06/15/17 0446 06/16/17 0540  WBC 14.3*  < > 12.4* 14.5* 10.6* 11.0* 10.2  NEUTROABS 10.7*  --   --   --   --   --   --   HGB 11.6*  < > 10.4* 9.1* 9.0* 9.2* 9.9*  HCT 35.7*  < > 31.4* 27.5* 27.6* 28.6* 30.4*  MCV 87.5  < > 87.7 90.5 90.2 88.8 88.4  PLT 224  < > 213 205 240 253 264  < > = values in this interval not displayed.  CBG:  Recent Labs Lab 06/13/17 0719 06/14/17 0729 06/14/17 1151 06/15/17 0717 06/16/17 0749  GLUCAP 123* 89 139* 101* 105*     SIGNED: Time coordinating discharge: 60 minutes  Bradshaw Minihan Magick-Shandi Godfrey, MD  [facility] 06/16/2017, 12:12 PM Pager [phone]  If 7PM-7AM, please contact night-coverage [email or web address] Password [email or web address]

## 2017-06-17 LAB — URINE CULTURE: Culture: >=100000 — AB

## 2017-06-17 LAB — BASIC METABOLIC PANEL
Anion gap: 6 (ref 5–15)
BUN: 15 mg/dL (ref 6–20)
CO2: 29 mmol/L (ref 22–32)
Calcium: 8.3 mg/dL — ABNORMAL LOW (ref 8.9–10.3)
Chloride: 104 mmol/L (ref 101–111)
Creatinine, Ser: 0.67 mg/dL (ref 0.44–1.00)
GFR calc Af Amer: >60 mL/min (ref >60)
GFR calc non Af Amer: >60 mL/min (ref >60)
Glucose, Bld: 108 mg/dL — ABNORMAL HIGH (ref 65–99)
Potassium: 4.8 mmol/L (ref 3.5–5.1)
Sodium: 139 mmol/L (ref 135–145)

## 2017-06-17 LAB — CBC
HCT: 27.9 % — ABNORMAL LOW (ref 36.0–46.0)
Hemoglobin: 8.9 g/dL — ABNORMAL LOW (ref 12.0–15.0)
MCH: 28.6 pg (ref 26.0–34.0)
MCHC: 31.9 g/dL (ref 30.0–36.0)
MCV: 89.7 fL (ref 78.0–100.0)
Platelets: 257 10*3/uL (ref 150–400)
RBC: 3.11 MIL/uL — ABNORMAL LOW (ref 3.87–5.11)
RDW: 15.8 % — ABNORMAL HIGH (ref 11.5–15.5)
WBC: 9.7 10*3/uL (ref 4.0–10.5)

## 2017-06-17 LAB — GLUCOSE, CAPILLARY: Glucose-Capillary: 100 mg/dL — ABNORMAL HIGH (ref 65–99)

## 2017-06-17 NOTE — Progress Notes (Signed)
   Subjective:  Patient reports pain as mild to moderate.  Denies N/V/CP/SOB.  Objective:   VITALS:   Vitals:   06/16/17 1400 06/16/17 2027 06/16/17 2052 06/17/17 0613  BP: (!) 116/42  (!) 127/53 125/63  Pulse: 62  75 69  Resp: 18  16 16   Temp: 98.6 F (37 C)  97.9 F (36.6 C) 97.7 F (36.5 C)  TempSrc: Oral  Oral Oral  SpO2: 93% 91% 91% 91%  Weight:      Height:        NAD ABD soft Sensation intact distally Intact pulses distally Dorsiflexion/Plantar flexion intact Incision: dressing C/D/I Compartment soft   Lab Results  Component Value Date   WBC 9.7 06/17/2017   HGB 8.9 (L) 06/17/2017   HCT 27.9 (L) 06/17/2017   MCV 89.7 06/17/2017   PLT 257 06/17/2017   BMET    Component Value Date/Time   NA 139 06/17/2017 0358   K 4.8 06/17/2017 0358   CL 104 06/17/2017 0358   CO2 29 06/17/2017 0358   GLUCOSE 108 (H) 06/17/2017 0358   BUN 15 06/17/2017 0358   CREATININE 0.67 06/17/2017 0358   CREATININE 0.75 09/10/2016 1225   CALCIUM 8.3 (L) 06/17/2017 0358   GFRNONAA >60 06/17/2017 0358   GFRAA >60 06/17/2017 0358     Assessment/Plan: 6 Days Post-Op   Principal Problem:   Closed left hip fracture ([facility]) Active Problems:   Essential hypertension   Atrial fibrillation ([facility])   Closed comminuted intertrochanteric fracture of left femur ([facility])   TDWB LLE with walker Apixaban PT/OT PO pain control D/C to SNF   Victorina Kable, Horald Pollen 06/17/2017, 7:48 AM   Rod Can, MD Cell [phone]

## 2017-06-17 NOTE — Discharge Summary (Addendum)
Physician Discharge Summary  Sara Garcia FTD:[id number] DOB: 20-Dec-1927 DOA: 06/10/2017  PCP: Doree Albee, MD  Admit date: 06/10/2017 Discharge date: 06/18/2017  Recommendations for Outpatient Follow-up:  1. Pt will need to follow up with PCP in 1 week post discharge 2. Please obtain BMP to evaluate electrolytes and kidney function 3. Please also check CBC to evaluate Hg and Hct levels 4. Of hg drops, may need to hold Eliquis  5. Macrobid for 4 more days post discharge   Discharge Diagnoses:  Principal Problem:   Closed left hip fracture ([name]) Active Problems:   Essential hypertension   Atrial fibrillation ([facility])   Closed comminuted intertrochanteric fracture of left femur ([name])  Discharge Condition: Stable  Diet recommendation: Heart healthy diet discussed in details   History of present illness: 83 y.o.femalewith medical history significant of atrial fibrillation on Eliquis, previous history of DVT, essential hypertension, osteoporosis, GERD who presented after she rolled out of bed the morning of the admission. She landed on her left hip and denids hitting her head or losing consciousness. She was not able to get off the floor until the paramedics arrived.   ED Course:Imaging in the emergency department revealed intertrochanteric left hip fracture with varus angulation. Orthopedic surgery consulted.   Assessment & Plan:   Left hip fracture after fall - s/p left hip repair, post op day #6 - PT eval done, SNF recommended, awaiting for insurance authorization  - pt says that spasms are less intense and less frequent, overall much better  - cont Eliquis   Leukocytosis - urine culture positive for E. Coli - pt started on Macrobid and per sensitivity report this is appropriate abx - complete for 4 more days   Post op drop in Hg - no indication for transfusion at this time - no evidence of active bleeding, follow up in an outpatient setting   Paroxysmal atrial  fibrillation - CHADS2Vasc 4 - cont Cardizem and Metoprolol - cont Eliquis but keep close eye on Hg   HTN - reasonable control   Depression - Continue Cymbalta  GERD - Continue PPI  DVT prophylaxis: Eliquis Code Status: DNR Family Communication: daughter over the phone  Disposition Plan: SNF   Consultants:   Ortho  Procedures:   L hip repair 8/29   Antimicrobials:   Pre op   Macrobid for UTI 9/2 -->  Procedures/Studies: Dg Chest 1 View  Result Date: 06/10/2017 CLINICAL DATA:  Left hip fracture.  Preop imaging. EXAM: CHEST 1 VIEW COMPARISON:  12/21/2016 FINDINGS: No airspace consolidation. No pneumothorax. No effusion. Mild vascular prominence, likely due to supine positioning. Unchanged hilar, mediastinal and cardiac contours. IMPRESSION: No consolidation.  No large effusion. Electronically Signed   By: Andreas Newport M.D.   On: 06/10/2017 06:54   Dg Thoracic Spine 2 View  Result Date: 06/10/2017 CLINICAL DATA:  Presumed unwitnessed fall. Found on floor by family this morning. EXAM: THORACIC SPINE 2 VIEWS COMPARISON:  Chest CT 09/11/2016 FINDINGS: Unchanged kyphoscoliosis. The thoracic vertebrae are normal in height. No evidence of acute thoracic spine fracture. Moderately severe thoracic degenerative disc changes, with large osteophytes at the right lateral aspect of the vertebral column. No bone lesion or bony destruction. IMPRESSION: Kyphoscoliosis and moderate degenerative changes. No evidence of acute thoracic spine fracture. Electronically Signed   By: Andreas Newport M.D.   On: 06/10/2017 06:50   Dg Lumbar Spine Complete  Result Date: 06/10/2017 CLINICAL DATA:  Presumed unwitnessed fall. Found on floor by family this  morning. EXAM: LUMBAR SPINE - COMPLETE 4+ VIEW COMPARISON:  CT 09/11/2015 FINDINGS: Unchanged thoracolumbar scoliosis. No evidence of acute lumbar spine fracture. Moderately severe lumbar degenerative disc and facet changes. Sacroiliac joints  are grossly intact. IMPRESSION: Thoracolumbar scoliosis. Degenerative disc and facet changes. Negative for acute lumbar spine fracture. Electronically Signed   By: Andreas Newport M.D.   On: 06/10/2017 06:51   Ct Head Wo Contrast  Result Date: 06/10/2017 CLINICAL DATA:  Head trauma, fall from bed EXAM: CT HEAD WITHOUT CONTRAST CT CERVICAL SPINE WITHOUT CONTRAST TECHNIQUE: Multidetector CT imaging of the head and cervical spine was performed following the standard protocol without intravenous contrast. Multiplanar CT image reconstructions of the cervical spine were also generated. COMPARISON:  Head CT 10/20/2013 FINDINGS: CT HEAD FINDINGS Brain: No mass lesion, intraparenchymal hemorrhage or extra-axial collection. No evidence of acute cortical infarct. There is periventricular hypoattenuation compatible with chronic microvascular disease. Vascular: No hyperdense vessel or unexpected calcification. Skull: Normal visualized skull base, calvarium and extracranial soft tissues. Sinuses/Orbits: No sinus fluid levels or advanced mucosal thickening. No mastoid effusion. Normal orbits. CT CERVICAL SPINE FINDINGS Alignment: Reversal of the normal cervical lordosis without static subluxation. Facets are aligned. Occipital condyles are normally positioned. Skull base and vertebrae: No acute fracture. Soft tissues and spinal canal: No prevertebral fluid or swelling. No visible canal hematoma. Disc levels: No bony spinal canal stenosis. Fused left C2-C3 facets. Multilevel foraminal stenosis. Upper chest: Ground-glass opacities and septal thickening, possibly pulmonary edema. Other: Normal visualized paraspinal cervical soft tissues. IMPRESSION: 1. 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 2. No acute fracture or static subluxation of the cervical spine. 3. Biapical groundglass opacity and septal thickening, likely pulmonary edema. Correlate with chest radiograph. Electronically Signed    By: Ulyses Jarred M.D.   On: 06/10/2017 06:41   Ct Cervical Spine Wo Contrast  Result Date: 06/10/2017 CLINICAL DATA:  Head trauma, fall from bed EXAM: CT HEAD WITHOUT CONTRAST CT CERVICAL SPINE WITHOUT CONTRAST TECHNIQUE: Multidetector CT imaging of the head and cervical spine was performed following the standard protocol without intravenous contrast. Multiplanar CT image reconstructions of the cervical spine were also generated. COMPARISON:  Head CT 10/20/2013 FINDINGS: CT HEAD FINDINGS Brain: No mass lesion, intraparenchymal hemorrhage or extra-axial collection. No evidence of acute cortical infarct. There is periventricular hypoattenuation compatible with chronic microvascular disease. Vascular: No hyperdense vessel or unexpected calcification. Skull: Normal visualized skull base, calvarium and extracranial soft tissues. Sinuses/Orbits: No sinus fluid levels or advanced mucosal thickening. No mastoid effusion. Normal orbits. CT CERVICAL SPINE FINDINGS Alignment: Reversal of the normal cervical lordosis without static subluxation. Facets are aligned. Occipital condyles are normally positioned. Skull base and vertebrae: No acute fracture. Soft tissues and spinal canal: No prevertebral fluid or swelling. No visible canal hematoma. Disc levels: No bony spinal canal stenosis. Fused left C2-C3 facets. Multilevel foraminal stenosis. Upper chest: Ground-glass opacities and septal thickening, possibly pulmonary edema. Other: Normal visualized paraspinal cervical soft tissues. IMPRESSION: 1. 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 2. No acute fracture or static subluxation of the cervical spine. 3. Biapical groundglass opacity and septal thickening, likely pulmonary edema. Correlate with chest radiograph. Electronically Signed   By: Ulyses Jarred M.D.   On: 06/10/2017 06:41   Pelvis Portable  Result Date: 06/11/2017 CLINICAL DATA:  83 y/o  F; status post left intramedullary  nail. EXAM: LEFT FEMUR PORTABLE 2 VIEWS; PORTABLE PELVIS 1-2 VIEWS COMPARISON:  06/10/2017 left lower extremity radiographs. FINDINGS: Pelvis: Interval placement of a left  femur intramedullary nail, femoral head lag screw, and distal interlocking screw fixing an acute intertrochanteric proximal femur fracture. There is improved alignment with minimal residual anterolateral apex angulation of the fracture and displacement of fracture components. Mild air and edema in the left hip soft tissues compatible postsurgical changes. No new fracture identified. The left hip joint is well maintained. Mild-to-moderate osteoarthrosis of the right hip joint with joint space narrowing. Left femur: Interval placement of a left femur intramedullary nail, femoral head lag screw, and distal interlocking screw fixing an acute intertrochanteric proximal femur fracture. There is improved alignment with minimal residual anterolateral apex angulation of the fracture and displacement of fracture components. IMPRESSION: Interval placement of the left femur intramedullary nail fixing an acute intertrochanteric proximal left femur fracture with expected postsurgical changes. Electronically Signed   By: Kristine Garbe M.D.   On: 06/11/2017 21:16   Dg C-arm 61-120 Min-no Report  Result Date: 06/11/2017 Fluoroscopy was utilized by the requesting physician.  No radiographic interpretation.   Dg Hip Unilat W Or Wo Pelvis 2-3 Views Left  Result Date: 06/10/2017 CLINICAL DATA:  Presumed unwitnessed fall. Found on floor by family this morning EXAM: DG HIP (WITH OR WITHOUT PELVIS) 2-3V LEFT COMPARISON:  None. FINDINGS: There is an acute mildly comminuted intertrochanteric left hip fracture with varus angulation. No dislocation. Bony pelvis appears intact. Pubic symphysis and sacroiliac joints appear intact. Moderate right hip arthritis incidentally noted. IMPRESSION: Intertrochanteric left hip fracture with varus angulation.  Electronically Signed   By: Andreas Newport M.D.   On: 06/10/2017 06:52   Dg Femur Port Min 2 Views Left  Result Date: 06/11/2017 CLINICAL DATA:  83 y/o  F; status post left intramedullary nail. EXAM: LEFT FEMUR PORTABLE 2 VIEWS; PORTABLE PELVIS 1-2 VIEWS COMPARISON:  06/10/2017 left lower extremity radiographs. FINDINGS: Pelvis: Interval placement of a left femur intramedullary nail, femoral head lag screw, and distal interlocking screw fixing an acute intertrochanteric proximal femur fracture. There is improved alignment with minimal residual anterolateral apex angulation of the fracture and displacement of fracture components. Mild air and edema in the left hip soft tissues compatible postsurgical changes. No new fracture identified. The left hip joint is well maintained. Mild-to-moderate osteoarthrosis of the right hip joint with joint space narrowing. Left femur: Interval placement of a left femur intramedullary nail, femoral head lag screw, and distal interlocking screw fixing an acute intertrochanteric proximal femur fracture. There is improved alignment with minimal residual anterolateral apex angulation of the fracture and displacement of fracture components. IMPRESSION: Interval placement of the left femur intramedullary nail fixing an acute intertrochanteric proximal left femur fracture with expected postsurgical changes. Electronically Signed   By: Kristine Garbe M.D.   On: 06/11/2017 21:16   Dg Knee Ap/lat W/sunrise Right  Result Date: 05/27/2017 Radiology reading dictated by Dr. Aline Brochure 3 views right knee for right knee pain Tibiofemoral alignment is neutral Symmetric joint space narrowing medial lateral compartment subchondral sclerosis joint space narrowing is considered greater than 75% of normal with Patellofemoral alignment is normal Impression moderate to severe osteoarthritis right knee   Discharge Exam: Vitals:   06/17/17 0613 06/17/17 1037  BP: 125/63   Pulse: 69    Resp: 16   Temp: 97.7 F (36.5 C)   SpO2: 91% 92%   Vitals:   06/16/17 2027 06/16/17 2052 06/17/17 0613 06/17/17 1037  BP:  (!) 127/53 125/63   Pulse:  75 69   Resp:  16 16   Temp:  97.9 F (36.6 C)  97.7 F (36.5 C)   TempSrc:  Oral Oral   SpO2: 91% 91% 91% 92%  Weight:      Height:       Physical Exam  Constitutional: Appears calm, pleasant, NAD CVS: RRR, S1/S2 +, no murmurs, no gallops, no carotid bruit.  Pulmonary: Effort and breath sounds normal, no stridor, rhonchi, wheezes, rales.  Abdominal: Soft. BS +,  no distension, tenderness, rebound or guarding.  Musculoskeletal: No edema and no tenderness.   Discharge Instructions  Discharge Instructions    Diet - low sodium heart healthy    Complete by:  As directed    Increase activity slowly    Complete by:  As directed      Allergies as of 06/17/2017      Reactions   Latex Itching, Rash   Adhesive [tape] Other (See Comments)   Tears skin   Betadine [povidone Iodine]    blisters   Ciprofloxacin Nausea And Vomiting   Codeine Nausea And Vomiting   Upsets stomach, nightmares   Penicillins Hives   Has patient had a PCN reaction causing immediate rash, facial/tongue/throat swelling, SOB or lightheadedness with hypotension: yes Has patient had a PCN reaction causing severe rash involving mucus membranes or skin necrosis: yes Has patient had a PCN reaction that required hospitalization: no Has patient had a PCN reaction occurring within the last 10 years: no If all of the above answers are "NO", then may proceed with Cephalosporin use.   Sulfa Antibiotics Hives      Medication List    TAKE these medications   acetaminophen 500 MG tablet Commonly known as:  TYLENOL Take 1,000 mg by mouth 2 (two) times daily as needed for mild pain, moderate pain or headache.   apixaban 5 MG Tabs tablet Commonly known as:  ELIQUIS Take 1 tablet (5 mg total) by mouth 2 (two) times daily.   calcium-vitamin D 500-200 MG-UNIT  tablet Take 1 tablet by mouth daily.   cyanocobalamin 2000 MCG tablet Take 2,000 mcg by mouth daily.   diltiazem 30 MG tablet Commonly known as:  CARDIZEM Take 1 tablet (30 mg total) by mouth 2 (two) times daily.   DULoxetine 60 MG capsule Commonly known as:  CYMBALTA Take 60 mg by mouth 2 (two) times daily.   esomeprazole 20 MG capsule Commonly known as:  NEXIUM Take 1 capsule (20 mg total) by mouth daily at 12 noon.   Fluticasone-Salmeterol 250-50 MCG/DOSE Aepb Commonly known as:  ADVAIR Inhale 1 puff into the lungs daily.   HYDROcodone-acetaminophen 5-325 MG tablet Commonly known as:  NORCO/VICODIN Take 1-2 tablets by mouth every 6 (six) hours as needed for moderate pain.   metoprolol tartrate 25 MG tablet Commonly known as:  LOPRESSOR Take 1.5 tablets (37.5 mg total) by mouth 2 (two) times daily.   nitrofurantoin (macrocrystal-monohydrate) 100 MG capsule Commonly known as:  MACROBID Take 1 capsule (100 mg total) by mouth every 12 (twelve) hours.   polyethylene glycol powder powder Commonly known as:  GLYCOLAX/MIRALAX MIX 17 GRAMS WITH LIQUID AND TAKE BY MOUTH ONCE DAILY What changed:  See the new instructions.   pyridOXINE 100 MG tablet Commonly known as:  VITAMIN B-6 Take 100 mg by mouth daily.   senna-docusate 8.6-50 MG tablet Commonly known as:  Senokot-S Take 1 tablet by mouth at bedtime as needed for mild constipation.            Discharge Care Instructions        Start  Ordered   06/17/17 0000  Increase activity slowly     06/17/17 1045   06/17/17 0000  Diet - low sodium heart healthy     06/17/17 1045   06/16/17 0000  nitrofurantoin, macrocrystal-monohydrate, (MACROBID) 100 MG capsule  Every 12 hours     06/16/17 1212   06/16/17 0000  senna-docusate (SENOKOT-S) 8.6-50 MG tablet  At bedtime PRN     06/16/17 1212   06/13/17 0000  HYDROcodone-acetaminophen (NORCO/VICODIN) 5-325 MG tablet  Every 6 hours PRN     06/13/17 1829     Follow-up  Information    Swinteck, Aaron Edelman, MD. Schedule an appointment as soon as possible for a visit in 2 week(s).   Specialty:  Orthopedic Surgery Why:  For wound re-check Contact information: [address] [place] [address] [phone]        Doree Albee, MD Follow up.   Specialty:  Internal Medicine Contact information: [address] [place] [address] [phone]            The results of significant diagnostics from this hospitalization (including imaging, microbiology, ancillary and laboratory) are listed below for reference.     Microbiology: Recent Results (from the past 240 hour(s))  Surgical PCR screen     Status: Abnormal   Collection Time: 06/10/17  4:33 PM  Result Value Ref Range Status   MRSA, PCR NEGATIVE NEGATIVE Final   Staphylococcus aureus POSITIVE (A) NEGATIVE Final    Comment: (NOTE) The Xpert SA Assay (FDA approved for NASAL specimens in patients 55 years of age and older), is one component of a comprehensive surveillance program. It is not intended to diagnose infection nor to guide or monitor treatment.   Urine Culture     Status: Abnormal   Collection Time: 06/11/17  6:22 PM  Result Value Ref Range Status   Specimen Description URINE, CATHETERIZED  Final   Special Requests NONE  Final   Culture MULTIPLE SPECIES PRESENT, SUGGEST RECOLLECTION (A)  Final   Report Status 06/13/2017 FINAL  Final  Culture, Urine     Status: Abnormal   Collection Time: 06/14/17  6:59 AM  Result Value Ref Range Status   Specimen Description URINE, CLEAN CATCH  Final   Special Requests NONE  Final   Culture 50,000 COLONIES/mL ESCHERICHIA COLI (A)  Final   Report Status 06/16/2017 FINAL  Final   Organism ID, Bacteria ESCHERICHIA COLI (A)  Final      Susceptibility   Escherichia coli - MIC*    AMPICILLIN <=2 SENSITIVE Sensitive     CEFAZOLIN <=4 SENSITIVE Sensitive     CEFTRIAXONE <=1 SENSITIVE Sensitive     CIPROFLOXACIN <=0.25  SENSITIVE Sensitive     GENTAMICIN <=1 SENSITIVE Sensitive     IMIPENEM <=0.25 SENSITIVE Sensitive     NITROFURANTOIN <=16 SENSITIVE Sensitive     TRIMETH/SULFA <=20 SENSITIVE Sensitive     AMPICILLIN/SULBACTAM <=2 SENSITIVE Sensitive     PIP/TAZO <=4 SENSITIVE Sensitive     Extended ESBL NEGATIVE Sensitive     * 50,000 COLONIES/mL ESCHERICHIA COLI  Culture, Urine     Status: Abnormal   Collection Time: 06/15/17 10:55 AM  Result Value Ref Range Status   Specimen Description URINE, CLEAN CATCH  Final   Special Requests NONE  Final   Culture >=100,000 COLONIES/mL ESCHERICHIA COLI (A)  Final   Report Status 06/17/2017 FINAL  Final   Organism ID, Bacteria ESCHERICHIA COLI (A)  Final  Susceptibility   Escherichia coli - MIC*    AMPICILLIN <=2 SENSITIVE Sensitive     CEFAZOLIN <=4 SENSITIVE Sensitive     CEFTRIAXONE <=1 SENSITIVE Sensitive     CIPROFLOXACIN <=0.25 SENSITIVE Sensitive     GENTAMICIN <=1 SENSITIVE Sensitive     IMIPENEM <=0.25 SENSITIVE Sensitive     NITROFURANTOIN <=16 SENSITIVE Sensitive     TRIMETH/SULFA <=20 SENSITIVE Sensitive     AMPICILLIN/SULBACTAM <=2 SENSITIVE Sensitive     PIP/TAZO <=4 SENSITIVE Sensitive     Extended ESBL NEGATIVE Sensitive     * >=100,000 COLONIES/mL ESCHERICHIA COLI     Labs: Basic Metabolic Panel:  Recent Labs Lab 06/13/17 0611 06/14/17 0510 06/15/17 0446 06/16/17 0540 06/17/17 0358  NA 138 137 137 138 139  K 4.0 5.1 4.4 4.1 4.8  CL 107 106 103 102 104  CO2 24 26 25 27 29   GLUCOSE 131* 123* 107* 99 108*  BUN 26* 21* 14 14 15   CREATININE 0.85 0.66 0.69 0.62 0.67  CALCIUM 8.2* 8.3* 8.1* 8.1* 8.3*   CBC:  Recent Labs Lab 06/13/17 0422 06/14/17 0510 06/15/17 0446 06/16/17 0540 06/17/17 0358  WBC 14.5* 10.6* 11.0* 10.2 9.7  HGB 9.1* 9.0* 9.2* 9.9* 8.9*  HCT 27.5* 27.6* 28.6* 30.4* 27.9*  MCV 90.5 90.2 88.8 88.4 89.7  PLT 205 240 253 264 257    CBG:  Recent Labs Lab 06/14/17 0729 06/14/17 1151  06/15/17 0717 06/16/17 0749 06/17/17 0757  GLUCAP 89 139* 101* 105* 100*    SIGNED: Time coordinating discharge: 60 minutes  Zahria Ding Magick-Vivaan Helseth, MD  [facility] 06/17/2017, 10:46 AM Pager [phone]  If 7PM-7AM, please contact night-coverage [email or web address] Password [email or web address]

## 2017-06-17 NOTE — Progress Notes (Signed)
CSW followed up with staff member Mardene Celeste from [facility] about patient's insurance authorization. Mardene Celeste reported that [employer] authorization is still under review and that they are waiting to hear back. Staff agreed to contact CSW with any updates.   CSW spoke with patient/patient's daughter at bedside and informed them that the patient had not received insurance authorization. CSW inquired about patient's back plan. Patient's daughter reported that they did not have a backup plan and that were unable to private pay for SNF. Patient's daughter reported that the patient is not ready for discharge and still having labs done.   CSW provided update to patient's attending MD regarding insurance authorization. Patient's attending MD reported that patient is not ready today.   CSW contacted by staff member Mardene Celeste from [facility] and informed that [facility] is requesting additional information. CSW provided additional requested information to SNF and awaiting return call from SNF regarding insurance authorization.   CSW continuing to follow to assist with discharge planning.  Abundio Miu, [place] Social Worker [facility] Cell#: [phone]

## 2017-06-18 LAB — BASIC METABOLIC PANEL
Anion gap: 7 (ref 5–15)
BUN: 15 mg/dL (ref 6–20)
CO2: 26 mmol/L (ref 22–32)
Calcium: 7.9 mg/dL — ABNORMAL LOW (ref 8.9–10.3)
Chloride: 107 mmol/L (ref 101–111)
Creatinine, Ser: 0.69 mg/dL (ref 0.44–1.00)
GFR calc Af Amer: >60 mL/min (ref >60)
GFR calc non Af Amer: >60 mL/min (ref >60)
Glucose, Bld: 106 mg/dL — ABNORMAL HIGH (ref 65–99)
Potassium: 4 mmol/L (ref 3.5–5.1)
Sodium: 140 mmol/L (ref 135–145)

## 2017-06-18 LAB — CBC
HCT: 26.8 % — ABNORMAL LOW (ref 36.0–46.0)
Hemoglobin: 8.6 g/dL — ABNORMAL LOW (ref 12.0–15.0)
MCH: 29.3 pg (ref 26.0–34.0)
MCHC: 32.1 g/dL (ref 30.0–36.0)
MCV: 91.2 fL (ref 78.0–100.0)
Platelets: 276 10*3/uL (ref 150–400)
RBC: 2.94 MIL/uL — ABNORMAL LOW (ref 3.87–5.11)
RDW: 16.2 % — ABNORMAL HIGH (ref 11.5–15.5)
WBC: 8.4 10*3/uL (ref 4.0–10.5)

## 2017-06-18 LAB — GLUCOSE, CAPILLARY: Glucose-Capillary: 94 mg/dL (ref 65–99)

## 2017-06-18 MED ORDER — NYSTATIN 100000 UNIT/ML MT SUSP
5.0000 mL | Freq: Once | OROMUCOSAL | Status: AC
  Administered 2017-06-18: 500000 [IU] via ORAL
  Filled 2017-06-18: qty 5

## 2017-06-18 NOTE — Care Management Important Message (Signed)
Important Message  Patient Details  Name: Sara Garcia MRN: [id number] Date of Birth: 09-13-1928   Medicare Important Message Given:  Yes    Kerin Salen 06/18/2017, 10:28 AMImportant Message  Patient Details  Name: Sara Garcia MRN: [id number] Date of Birth: Oct 08, 1928   Medicare Important Message Given:  Yes    Kerin Salen 06/18/2017, 10:28 AM

## 2017-06-18 NOTE — Progress Notes (Signed)
Physical Therapy Treatment Patient Details Name: Sara Garcia MRN: [id number] DOB: 1928-03-12 Today's Date: 06/18/2017    History of Present Illness 83 yo female admitted with closed left hip fracture. S/P Im nail L femur 06/11/17. Hx of falls, Afib, DVT, osteoporosis, HTN    PT Comments    POD # 7  assisted OOB to [facility] then amb using B platform EVA walker to ensure PWB 30%  + 2 assist for safety/anxiety.     Follow Up Recommendations  SNF     Equipment Recommendations  None recommended by PT    Recommendations for Other Services       Precautions / Restrictions Precautions Precautions: Fall Precaution Comments: DO NOT TOUCH HER FEET Restrictions Weight Bearing Restrictions: Yes LLE Weight Bearing: Partial weight bearing LLE Partial Weight Bearing Percentage or Pounds: 30%    Mobility  Bed Mobility   Bed Mobility: Supine to Sit     Supine to sit: Max assist;Total assist;+2 for physical assistance;+2 for safety/equipment     General bed mobility comments: required increased assist and use of bed pad to complete pivot/scoot to EOB.    Transfers Overall transfer level: Needs assistance Equipment used: None Transfers: Sit to/from [facility] Sit to Stand: Max assist;+2 safety/equipment;+2 physical assistance Stand pivot transfers: Max assist;+2 safety/equipment;+2 physical assistance       General transfer comment: assisted from elevated bed to [facility] 1/4 pivot turn towards pt R with increased time and 50% VC's on turn completion as well as hand transfer.  Assisted off BSC to attempt amb.  Ambulation/Gait Ambulation/Gait assistance: Max assist;Total assist;+2 physical assistance;+2 safety/equipment Ambulation Distance (Feet): 4 Feet Assistive device: Bilateral platform walker (EVA walker) Gait Pattern/deviations: Step-to pattern;Decreased stance time - left;Narrow base of support;Trunk flexed Gait velocity: decreased    General Gait Details: used B  plform EVA walker for increased support and to ensure PWB.  Pt was able to progress gait to 4 feet with much encouragement.   Stairs            Wheelchair Mobility    Modified Rankin (Stroke Patients Only)       Balance                                            Cognition Arousal/Alertness: Awake/alert Behavior During Therapy: WFL for tasks assessed/performed Overall Cognitive Status: Within Functional Limits for tasks assessed                                 General Comments: more anxiety this session.  Required increased time and increased VC's       Exercises      General Comments        Pertinent Vitals/Pain Pain Assessment: Faces Faces Pain Scale: Hurts even more Pain Location: L groin Pain Descriptors / Indicators: Aching;Sore;Sharp;Grimacing;Guarding;Operative site guarding Pain Intervention(s): Monitored during session;Repositioned;Ice applied;Premedicated before session    Home Living                      Prior Function            PT Goals (current goals can now be found in the care plan section) Progress towards PT goals: Progressing toward goals    Frequency    Min 3X/week  PT Plan Current plan remains appropriate    Co-evaluation              AM-PAC PT "6 Clicks" Daily Activity  Outcome Measure  Difficulty turning over in bed (including adjusting bedclothes, sheets and blankets)?: Unable Difficulty moving from lying on back to sitting on the side of the bed? : Unable Difficulty sitting down on and standing up from a chair with arms (e.g., wheelchair, bedside commode, etc,.)?: Unable Help needed moving to and from a bed to chair (including a wheelchair)?: Total Help needed walking in hospital room?: Total Help needed climbing 3-5 steps with a railing? : Total 6 Click Score: 6    End of Session Equipment Utilized During Treatment: Gait belt Activity Tolerance: Patient limited by  pain;Other (comment) (anxiety) Patient left: in chair;with call bell/phone within reach Nurse Communication: Mobility status PT Visit Diagnosis: Pain Pain - Right/Left: Left Pain - part of body: Leg     Time: 6834-1962 PT Time Calculation (min) (ACUTE ONLY): 44 min  Charges:  $Gait Training: 8-22 mins $Therapeutic Exercise: 8-22 mins $Therapeutic Activity: 8-22 mins                    G Codes:       Rica Koyanagi  PTA [facility]  Acute  Rehab Pager      [phone]

## 2017-06-18 NOTE — Clinical Social Work Placement (Signed)
Patient received and accepted bed offer at [facility]. Staff confirmed insurance authorization received and patient's ability to arrive. PTAR contacted, family notified. Patient's RN provided with packet and number to call report. CSW signing off, no other needs identified at this time.  CLINICAL SOCIAL WORK PLACEMENT  NOTE  Date:  06/18/2017  Patient Details  Name: Sara Garcia MRN: [id number] Date of Birth: 1928-04-08  Clinical Social Work is seeking post-discharge placement for this patient at the [place] level of care (*CSW will initial, date and re-position this form in  chart as items are completed):  Yes   Patient/family provided with [place] Work Department's list of facilities offering this level of care within the geographic area requested by the patient (or if unable, by the patient's family).  Yes   Patient/family informed of their freedom to choose among providers that offer the needed level of care, that participate in Medicare, Medicaid or managed care program needed by the patient, have an available bed and are willing to accept the patient.  Yes   Patient/family informed of [facility]'s ownership interest in [facility] and [facility], as well as of the fact that they are under no obligation to receive care at these facilities.  PASRR submitted to EDS on       PASRR number received on 06/12/17     Existing PASRR number confirmed on       FL2 transmitted to all facilities in geographic area requested by pt/family on 06/12/17     FL2 transmitted to all facilities within larger geographic area on       Patient informed that his/her managed care company has contracts with or will negotiate with certain facilities, including the following:        Yes   Patient/family informed of bed offers received.  Patient chooses bed at [facility]     Physician recommends and patient chooses  bed at      Patient to be transferred to [facility] on 06/18/17.  Patient to be transferred to facility by PTAR     Patient family notified on 06/18/17 of transfer.  Name of family member notified:  Ilda Mori     PHYSICIAN       Additional Comment:    _______________________________________________ Burnis Medin, LCSW 06/18/2017, 2:20 PM

## 2017-06-18 NOTE — Progress Notes (Signed)
Report called to [place] at [facility].  Ambulance here to transport to facility. Andre Lefort

## 2017-06-18 NOTE — Progress Notes (Signed)
Occupational Therapy Treatment Patient Details Name: Sara Garcia MRN: [id number] DOB: 09-Oct-1928 Today's Date: 06/18/2017    History of present illness 83 yo female admitted with closed left hip fracture. S/P Im nail L femur 06/11/17. Hx of falls, Afib, DVT, osteoporosis, HTN      Follow Up Recommendations  SNF    Equipment Recommendations  None recommended by OT    Recommendations for Other Services      Precautions / Restrictions Precautions Precautions: Fall Precaution Comments: DO NOT TOUCH HER FEET Restrictions Weight Bearing Restrictions: Yes LLE Weight Bearing: Partial weight bearing LLE Partial Weight Bearing Percentage or Pounds: 30%       Mobility Bed Mobility   Bed Mobility: Supine to Sit     Supine to sit: Max assist;Total assist;+2 for physical assistance;+2 for safety/equipment     General bed mobility comments: pt in chair  Transfers Overall transfer level: Needs assistance Equipment used: Rolling walker (2 wheeled) Transfers: Sit to/from [facility] Sit to Stand: Max assist Stand pivot transfers: Max assist;+2 safety/equipment;+2 physical assistance       General transfer comment: VC for hand placement and technique    Balance Overall balance assessment: Needs assistance;History of Falls   Sitting balance-Leahy Scale: Fair                                     ADL either performed or assessed with clinical judgement   ADL Overall ADL's : Needs assistance/impaired     Grooming: Set up;Sitting;Wash/dry face;Wash/dry Press photographer and Hygiene: Maximal assistance;Sit to/from stand;Cueing for safety;Cueing for sequencing;Cueing for compensatory techniques Toileting - Clothing Manipulation Details (indicate cue type and reason): Pt Performed 2 times focusing on technique and maintaining PWB       General ADL Comments: VC for PWB with sit to stand      Vision Baseline Vision/History: No visual deficits     Perception     Praxis      Cognition Arousal/Alertness: Awake/alert Behavior During Therapy: WFL for tasks assessed/performed Overall Cognitive Status: Within Functional Limits for tasks assessed                                 General Comments: more anxiety this session.  Required increased time and increased VC's                    Pertinent Vitals/ Pain       Pain Assessment: 0-10 Pain Score: 6  Faces Pain Scale: Hurts even more Pain Location: L hip area Pain Descriptors / Indicators: Aching;Sore;Grimacing;Guarding;Operative site guarding;Discomfort Pain Intervention(s): Monitored during session;Ice applied  Home Living                                              Frequency  Min 2X/week        Progress Toward Goals  OT Goals(current goals can now be found in the care plan section)  Progress towards OT goals: Progressing toward goals     Plan Discharge plan remains appropriate    Co-evaluation  AM-PAC PT "6 Clicks" Daily Activity     Outcome Measure   Help from another person eating meals?: None Help from another person taking care of personal grooming?: A Little Help from another person toileting, which includes using toliet, bedpan, or urinal?: Total Help from another person bathing (including washing, rinsing, drying)?: A Lot Help from another person to put on and taking off regular upper body clothing?: A Little Help from another person to put on and taking off regular lower body clothing?: A Lot 6 Click Score: 15    End of Session Equipment Utilized During Treatment: Rolling walker  OT Visit Diagnosis: Unsteadiness on feet (R26.81);Muscle weakness (generalized) (M62.81);Pain;History of falling (Z91.81);Repeated falls (R29.6) Pain - Right/Left: Left Pain - part of body: Hip   Activity Tolerance Patient tolerated treatment well    Patient Left in chair;with call bell/phone within reach   Nurse Communication Mobility status;Weight bearing status        Time: 4920-1007 OT Time Calculation (min): 23 min  Charges: OT General Charges $OT Visit: 1 Visit OT Treatments $Self Care/Home Management : 23-37 mins  [place], [place]   Betsy Pries 06/18/2017, 1:36 PM

## 2017-07-21 ENCOUNTER — Emergency Department (HOSPITAL_COMMUNITY): Payer: Medicare HMO

## 2017-07-21 ENCOUNTER — Encounter (HOSPITAL_COMMUNITY): Payer: Self-pay | Admitting: *Deleted

## 2017-07-21 ENCOUNTER — Inpatient Hospital Stay (HOSPITAL_COMMUNITY)
Admission: EM | Admit: 2017-07-21 | Discharge: 2017-07-24 | DRG: 065 | Disposition: A | Discharge status: Home Health | Payer: Medicare HMO | Attending: Family Medicine | Admitting: Family Medicine

## 2017-07-21 DIAGNOSIS — G9340 Encephalopathy, unspecified: Secondary | ICD-10-CM

## 2017-07-21 DIAGNOSIS — G8929 Other chronic pain: Secondary | ICD-10-CM | POA: Diagnosis present

## 2017-07-21 DIAGNOSIS — I10 Essential (primary) hypertension: Secondary | ICD-10-CM | POA: Diagnosis present

## 2017-07-21 DIAGNOSIS — M549 Dorsalgia, unspecified: Secondary | ICD-10-CM | POA: Diagnosis present

## 2017-07-21 DIAGNOSIS — Z90710 Acquired absence of both cervix and uterus: Secondary | ICD-10-CM | POA: Diagnosis not present

## 2017-07-21 DIAGNOSIS — R4701 Aphasia: Secondary | ICD-10-CM | POA: Diagnosis present

## 2017-07-21 DIAGNOSIS — I63432 Cerebral infarction due to embolism of left posterior cerebral artery: Secondary | ICD-10-CM | POA: Diagnosis present

## 2017-07-21 DIAGNOSIS — R471 Dysarthria and anarthria: Secondary | ICD-10-CM | POA: Diagnosis not present

## 2017-07-21 DIAGNOSIS — B370 Candidal stomatitis: Secondary | ICD-10-CM | POA: Diagnosis not present

## 2017-07-21 DIAGNOSIS — Z91040 Latex allergy status: Secondary | ICD-10-CM | POA: Diagnosis not present

## 2017-07-21 DIAGNOSIS — Z66 Do not resuscitate: Secondary | ICD-10-CM | POA: Diagnosis present

## 2017-07-21 DIAGNOSIS — I482 Chronic atrial fibrillation: Secondary | ICD-10-CM | POA: Diagnosis present

## 2017-07-21 DIAGNOSIS — I480 Paroxysmal atrial fibrillation: Secondary | ICD-10-CM | POA: Diagnosis present

## 2017-07-21 DIAGNOSIS — Z7901 Long term (current) use of anticoagulants: Secondary | ICD-10-CM | POA: Diagnosis not present

## 2017-07-21 DIAGNOSIS — E876 Hypokalemia: Secondary | ICD-10-CM | POA: Diagnosis present

## 2017-07-21 DIAGNOSIS — R600 Localized edema: Secondary | ICD-10-CM

## 2017-07-21 DIAGNOSIS — Z86718 Personal history of other venous thrombosis and embolism: Secondary | ICD-10-CM

## 2017-07-21 DIAGNOSIS — J45909 Unspecified asthma, uncomplicated: Secondary | ICD-10-CM | POA: Diagnosis present

## 2017-07-21 DIAGNOSIS — Z888 Allergy status to other drugs, medicaments and biological substances status: Secondary | ICD-10-CM

## 2017-07-21 DIAGNOSIS — R000 Tachycardia, unspecified: Secondary | ICD-10-CM | POA: Diagnosis present

## 2017-07-21 DIAGNOSIS — Z885 Allergy status to narcotic agent status: Secondary | ICD-10-CM | POA: Diagnosis not present

## 2017-07-21 DIAGNOSIS — R4781 Slurred speech: Secondary | ICD-10-CM

## 2017-07-21 DIAGNOSIS — I361 Nonrheumatic tricuspid (valve) insufficiency: Secondary | ICD-10-CM | POA: Diagnosis not present

## 2017-07-21 DIAGNOSIS — G9349 Other encephalopathy: Secondary | ICD-10-CM | POA: Diagnosis present

## 2017-07-21 DIAGNOSIS — M810 Age-related osteoporosis without current pathological fracture: Secondary | ICD-10-CM | POA: Diagnosis present

## 2017-07-21 DIAGNOSIS — Z23 Encounter for immunization: Secondary | ICD-10-CM | POA: Diagnosis present

## 2017-07-21 DIAGNOSIS — N390 Urinary tract infection, site not specified: Secondary | ICD-10-CM | POA: Diagnosis present

## 2017-07-21 DIAGNOSIS — Z882 Allergy status to sulfonamides status: Secondary | ICD-10-CM

## 2017-07-21 DIAGNOSIS — I639 Cerebral infarction, unspecified: Secondary | ICD-10-CM

## 2017-07-21 DIAGNOSIS — R29703 NIHSS score 3: Secondary | ICD-10-CM | POA: Diagnosis present

## 2017-07-21 DIAGNOSIS — K219 Gastro-esophageal reflux disease without esophagitis: Secondary | ICD-10-CM | POA: Diagnosis present

## 2017-07-21 DIAGNOSIS — Z79899 Other long term (current) drug therapy: Secondary | ICD-10-CM | POA: Diagnosis not present

## 2017-07-21 DIAGNOSIS — I371 Nonrheumatic pulmonary valve insufficiency: Secondary | ICD-10-CM | POA: Diagnosis not present

## 2017-07-21 LAB — URINALYSIS, ROUTINE W REFLEX MICROSCOPIC
Bacteria, UA: NONE SEEN
Bilirubin Urine: NEGATIVE
Glucose, UA: NEGATIVE mg/dL
Hgb urine dipstick: NEGATIVE
Ketones, ur: 5 mg/dL — AB
Nitrite: NEGATIVE
Protein, ur: NEGATIVE mg/dL
Specific Gravity, Urine: 1.021 (ref 1.005–1.030)
Squamous Epithelial / LPF: NONE SEEN
pH: 5 (ref 5.0–8.0)

## 2017-07-21 LAB — DIFFERENTIAL
Basophils Absolute: 0 10*3/uL (ref 0.0–0.1)
Basophils Relative: 0 %
Eosinophils Absolute: 0.2 10*3/uL (ref 0.0–0.7)
Eosinophils Relative: 2 %
Lymphocytes Relative: 44 %
Lymphs Abs: 3.7 10*3/uL (ref 0.7–4.0)
Monocytes Absolute: 0.8 10*3/uL (ref 0.1–1.0)
Monocytes Relative: 9 %
Neutro Abs: 3.9 10*3/uL (ref 1.7–7.7)
Neutrophils Relative %: 45 %

## 2017-07-21 LAB — PROTIME-INR
INR: 1.21
Prothrombin Time: 15.2 seconds (ref 11.4–15.2)

## 2017-07-21 LAB — TSH: TSH: 1.163 u[IU]/mL (ref 0.350–4.500)

## 2017-07-21 LAB — RAPID URINE DRUG SCREEN, HOSP PERFORMED
Amphetamines: NOT DETECTED
Barbiturates: NOT DETECTED
Benzodiazepines: NOT DETECTED
Cocaine: NOT DETECTED
Opiates: NOT DETECTED
Tetrahydrocannabinol: NOT DETECTED

## 2017-07-21 LAB — PROCALCITONIN: Procalcitonin: <0.1 ng/mL

## 2017-07-21 LAB — LACTIC ACID, PLASMA
Lactic Acid, Venous: 1.4 mmol/L (ref 0.5–1.9)
Lactic Acid, Venous: 1.1 mmol/L (ref 0.5–1.9)

## 2017-07-21 LAB — CBC
HCT: 35.4 % — ABNORMAL LOW (ref 36.0–46.0)
Hemoglobin: 11.4 g/dL — ABNORMAL LOW (ref 12.0–15.0)
MCH: 28.9 pg (ref 26.0–34.0)
MCHC: 32.2 g/dL (ref 30.0–36.0)
MCV: 89.8 fL (ref 78.0–100.0)
Platelets: 274 10*3/uL (ref 150–400)
RBC: 3.94 MIL/uL (ref 3.87–5.11)
RDW: 15.2 % (ref 11.5–15.5)
WBC: 8.6 10*3/uL (ref 4.0–10.5)

## 2017-07-21 LAB — COMPREHENSIVE METABOLIC PANEL
ALT: 17 U/L (ref 14–54)
AST: 23 U/L (ref 15–41)
Albumin: 3.5 g/dL (ref 3.5–5.0)
Alkaline Phosphatase: 126 U/L (ref 38–126)
Anion gap: 11 (ref 5–15)
BUN: 26 mg/dL — ABNORMAL HIGH (ref 6–20)
CO2: 26 mmol/L (ref 22–32)
Calcium: 8.6 mg/dL — ABNORMAL LOW (ref 8.9–10.3)
Chloride: 105 mmol/L (ref 101–111)
Creatinine, Ser: 0.69 mg/dL (ref 0.44–1.00)
GFR calc Af Amer: >60 mL/min (ref >60)
GFR calc non Af Amer: >60 mL/min (ref >60)
Glucose, Bld: 96 mg/dL (ref 65–99)
Potassium: 3 mmol/L — ABNORMAL LOW (ref 3.5–5.1)
Sodium: 142 mmol/L (ref 135–145)
Total Bilirubin: 0.8 mg/dL (ref 0.3–1.2)
Total Protein: 7 g/dL (ref 6.5–8.1)

## 2017-07-21 LAB — TROPONIN I: Troponin I: <0.03 ng/mL (ref <0.03)

## 2017-07-21 LAB — ETHANOL: Alcohol, Ethyl (B): <10 mg/dL (ref <10)

## 2017-07-21 LAB — APTT: aPTT: 34 seconds (ref 24–36)

## 2017-07-21 LAB — I-STAT CG4 LACTIC ACID, ED: Lactic Acid, Venous: 1.56 mmol/L (ref 0.5–1.9)

## 2017-07-21 MED ORDER — SODIUM CHLORIDE 0.9 % IV BOLUS (SEPSIS)
1000.0000 mL | Freq: Once | INTRAVENOUS | Status: AC
  Administered 2017-07-21: 1000 mL via INTRAVENOUS

## 2017-07-21 MED ORDER — ASPIRIN 300 MG RE SUPP: 300.0000 mg | Freq: Every day | RECTAL | Status: DC

## 2017-07-21 MED ORDER — DEXTROSE 5 % IV SOLN
1.0000 g | Freq: Once | INTRAVENOUS | Status: AC
  Administered 2017-07-21: 1 g via INTRAVENOUS
  Filled 2017-07-21 (×2): qty 1

## 2017-07-21 MED ORDER — STROKE: EARLY STAGES OF RECOVERY BOOK
Freq: Once | Status: AC
Start: 2017-07-21 — End: 2017-07-22
  Administered 2017-07-22: 10:00:00
  Filled 2017-07-21: qty 1

## 2017-07-21 MED ORDER — DULOXETINE HCL 60 MG PO CPEP
60.0000 mg | ORAL_CAPSULE | Freq: Two times a day (BID) | ORAL | Status: DC
  Administered 2017-07-22 – 2017-07-24 (×6): 60 mg via ORAL
  Filled 2017-07-21 (×6): qty 1

## 2017-07-21 MED ORDER — MOMETASONE FURO-FORMOTEROL FUM 200-5 MCG/ACT IN AERO
2.0000 | INHALATION_SPRAY | Freq: Two times a day (BID) | RESPIRATORY_TRACT | Status: DC
  Administered 2017-07-22 – 2017-07-24 (×5): 2 via RESPIRATORY_TRACT
  Filled 2017-07-21: qty 8.8

## 2017-07-21 MED ORDER — POLYETHYLENE GLYCOL 3350 17 G PO PACK: 17.0000 g | PACK | Freq: Every day | ORAL | Status: DC | PRN

## 2017-07-21 MED ORDER — ASPIRIN 325 MG PO TABS
325.0000 mg | ORAL_TABLET | Freq: Every day | ORAL | Status: DC
  Administered 2017-07-22 – 2017-07-24 (×3): 325 mg via ORAL
  Filled 2017-07-21 (×3): qty 1

## 2017-07-21 MED ORDER — ACETAMINOPHEN 160 MG/5ML PO SOLN: 650.0000 mg | ORAL | Status: DC | PRN

## 2017-07-21 MED ORDER — APIXABAN 5 MG PO TABS
5.0000 mg | ORAL_TABLET | Freq: Two times a day (BID) | ORAL | Status: DC
  Administered 2017-07-22 (×3): 5 mg via ORAL
  Filled 2017-07-21 (×3): qty 1

## 2017-07-21 MED ORDER — POTASSIUM CHLORIDE CRYS ER 20 MEQ PO TBCR
40.0000 meq | EXTENDED_RELEASE_TABLET | Freq: Once | ORAL | Status: AC
  Administered 2017-07-22: 40 meq via ORAL
  Filled 2017-07-21: qty 2

## 2017-07-21 MED ORDER — PANTOPRAZOLE SODIUM 40 MG PO TBEC
40.0000 mg | DELAYED_RELEASE_TABLET | Freq: Every day | ORAL | Status: DC
  Administered 2017-07-22 – 2017-07-24 (×3): 40 mg via ORAL
  Filled 2017-07-21 (×3): qty 1

## 2017-07-21 MED ORDER — NYSTATIN 100000 UNIT/ML MT SUSP
5.0000 mL | Freq: Four times a day (QID) | OROMUCOSAL | Status: DC
  Administered 2017-07-22 – 2017-07-24 (×11): 500000 [IU] via ORAL
  Filled 2017-07-21 (×10): qty 5

## 2017-07-21 MED ORDER — ACETAMINOPHEN 650 MG RE SUPP: 650.0000 mg | RECTAL | Status: DC | PRN

## 2017-07-21 MED ORDER — SENNOSIDES-DOCUSATE SODIUM 8.6-50 MG PO TABS: 1.0000 | ORAL_TABLET | Freq: Every evening | ORAL | Status: DC | PRN

## 2017-07-21 MED ORDER — SODIUM CHLORIDE 0.9 % IV SOLN
INTRAVENOUS | Status: DC
  Administered 2017-07-21: 23:00:00 via INTRAVENOUS

## 2017-07-21 MED ORDER — POLYETHYLENE GLYCOL 3350 17 GM/SCOOP PO POWD: 1.0000 | Freq: Every day | ORAL | Status: DC | PRN

## 2017-07-21 MED ORDER — INFLUENZA VAC SPLIT HIGH-DOSE 0.5 ML IM SUSY
0.5000 mL | PREFILLED_SYRINGE | INTRAMUSCULAR | Status: AC
Start: 2017-07-22 — End: 2017-07-22
  Administered 2017-07-22: 0.5 mL via INTRAMUSCULAR
  Filled 2017-07-21: qty 0.5

## 2017-07-21 MED ORDER — ACETAMINOPHEN 325 MG PO TABS
650.0000 mg | ORAL_TABLET | ORAL | Status: DC | PRN
  Administered 2017-07-22 – 2017-07-24 (×8): 650 mg via ORAL
  Filled 2017-07-21 (×9): qty 2

## 2017-07-21 NOTE — ED Provider Notes (Signed)
[place] DEPT Provider Note   CSN: [id number] Arrival date & time: 07/21/17  1748     History   Chief Complaint Chief Complaint  Patient presents with  . Dysuria  . Aphasia    HPI Sara Garcia is a 83 y.o. female.  HPI  The patient is an 83 year old female with a known history of atrial fibrillation, deep venous thrombosis on chronic Coumadin as well as a recent history of a hip fracture which left her hospitalized and then placed in a nursing facility until 2 weeks ago when she went home to live with family. The patient has recently had a urinary tract infection though I do not have a time frame or the medication she took. The daughter present with her corroborates the story. She is started to have more dysuria, lower abdominal pain and had not had any other symptoms other than some mild nausea and generalized weakness which was nonspecific and persistent since coming home from the nursing facility. Today at approximately 9:00 in the morning the daughter spoke with her mother, the patient, and stated that she had clear speech. When she spoke with her again at 4:00 the patient had some slurred speech. The patient denies any other focal neurologic symptoms, she has not had any fevers diarrhea chest pain or shortness of breath. She has chronic back pain. She thinks that she may have started to have some difficulty with speech around 10:00 in the morning.  Past Medical History:  Diagnosis Date  . A-fib ([place])   . Arthritis   . Asthma   . DVT (deep venous thrombosis) ([place]) 2011/2012   chronic coumadin, h/o blood clots on coumadin.  . Esophageal dysphagia    limited food and pill  . Esophageal reflux   . Essential hypertension, benign   . GERD (gastroesophageal reflux disease)   . Osteoporosis   . Unspecified prolapse of vaginal walls     Patient Active Problem List   Diagnosis Date Noted  . Closed comminuted intertrochanteric fracture of left femur ([place]) 06/11/2017  . Closed  left hip fracture ([place]) 06/10/2017  . Diarrhea 01/03/2016  . IDA (iron deficiency anemia) 11/09/2015  . Dysphagia, pharyngoesophageal phase   . Hiatal hernia   . Constipation 01/16/2015  . Esophageal dysphagia 06/27/2014  . GERD (gastroesophageal reflux disease) 06/27/2014  . Personal history of colonic polyps 06/27/2014  . Chronic anticoagulation 06/27/2014  . Primary osteoarthritis of right knee 05/09/2014  . DISH (diffuse idiopathic skeletal hyperostosis) 05/09/2014  . DDD (degenerative disc disease), lumbosacral 05/09/2014  . Prolapse of vaginal vault after hysterectomy 04/07/2014  . Atrial fibrillation ([place]) 01/10/2014  . Radiculopathy 01/10/2014  . Essential hypertension 12/21/2013    Past Surgical History:  Procedure Laterality Date  . ABDOMINAL HYSTERECTOMY    . COLONOSCOPY W/ BIOPSIES  02/01/2004   RMR: Anal papilla with normal rectum/Sigmoid diverticula/Small polyps in the cecum removed as described above. Inflammatory polyp  . ESOPHAGEAL DILATION     unknown year per patient.  . ESOPHAGEAL DILATION N/A 02/07/2015   RMR: Normal esophagus status post maloney dilation. Small hiatal hernia.   Marland Kitchen ESOPHAGOGASTRODUODENOSCOPY     unknown year per patient  . ESOPHAGOGASTRODUODENOSCOPY N/A 02/07/2015   RMR: Normal esophagus  status post  Maloney dilation. Small hiatal hernia  . FEMUR IM NAIL Left 06/11/2017   Procedure: INTRAMEDULLARY (IM) NAIL FEMORAL;  Surgeon: Rod Can, MD;  Location: [facility] ORS;  Service: Orthopedics;  Laterality: Left;  . NASAL SEPTUM SURGERY    .  TONSILLECTOMY    . WRIST FRACTURE SURGERY      OB History    No data available       Home Medications    Prior to Admission medications   Medication Sig Start Date End Date Taking? Authorizing Provider  acetaminophen (TYLENOL) 500 MG tablet Take 1,000 mg by mouth 2 (two) times daily as needed for mild pain, moderate pain or headache.    Yes [provider]  apixaban (ELIQUIS) 5 MG TABS tablet  Take 1 tablet (5 mg total) by mouth 2 (two) times daily. 01/14/17  Yes BranchAlphonse Guild, MD  Calcium Carb-Cholecalciferol (CALCIUM-VITAMIN D) 500-200 MG-UNIT tablet Take 1 tablet by mouth daily.   Yes [provider]  cyanocobalamin 2000 MCG tablet Take 2,000 mcg by mouth daily.   Yes [provider]  diltiazem (CARDIZEM) 30 MG tablet Take 1 tablet (30 mg total) by mouth 2 (two) times daily. 02/24/17  Yes BranchAlphonse Guild, MD  DULoxetine (CYMBALTA) 60 MG capsule Take 60 mg by mouth 2 (two) times daily.    Yes [provider]  esomeprazole (NEXIUM) 20 MG capsule Take 1 capsule (20 mg total) by mouth daily at 12 noon. 11/06/16  Yes Carlis Stable, NP  Fluticasone-Salmeterol (ADVAIR) 250-50 MCG/DOSE AEPB Inhale 1 puff into the lungs daily.    Yes [provider]  metoprolol tartrate (LOPRESSOR) 25 MG tablet Take 1.5 tablets (37.5 mg total) by mouth 2 (two) times daily. 02/24/17  Yes Branch, Alphonse Guild, MD  polyethylene glycol powder (GLYCOLAX/MIRALAX) powder MIX 17 GRAMS WITH LIQUID AND TAKE BY MOUTH ONCE DAILY Patient taking differently: MIX 17 GRAMS WITH LIQUID AND TAKE BY MOUTH DAILY AS NEEDED FOR CONSTIPATION. 12/13/16  Yes Mahala Menghini, PA-C  pyridOXINE (VITAMIN B-6) 100 MG tablet Take 100 mg by mouth daily.   Yes [provider]  HYDROcodone-acetaminophen (NORCO/VICODIN) 5-325 MG tablet Take 1-2 tablets by mouth every 6 (six) hours as needed for moderate pain. Patient not taking: Reported on 07/21/2017 06/13/17   Theodis Blaze, MD  nitrofurantoin, macrocrystal-monohydrate, (MACROBID) 100 MG capsule Take 1 capsule (100 mg total) by mouth every 12 (twelve) hours. Patient not taking: Reported on 07/21/2017 06/16/17   Theodis Blaze, MD    Family History Family History  Problem Relation Age of Onset  . Colon polyps Neg Hx   . Colon cancer Neg Hx     Social History Social History  Substance Use Topics  . Smoking status: Never Smoker  . Smokeless  tobacco: Never Used  . Alcohol use No     Allergies   Latex; Adhesive [tape]; Betadine [povidone iodine]; Ciprofloxacin; Codeine; Penicillins; Sulfa antibiotics; and Wellbutrin [bupropion]   Review of Systems Review of Systems  All other systems reviewed and are negative.    Physical Exam Updated Vital Signs BP (!) 162/94 (BP Location: Right Arm)   Pulse (!) 124   Temp 98.6 F (37 C)   Resp (!) 26   Ht 5\' 2"  (1.575 m)   Wt 71.2 kg (157 lb)   SpO2 95%   BMI 28.72 kg/m   Physical Exam  Constitutional: She appears well-developed and well-nourished. No distress.  HENT:  Head: Normocephalic and atraumatic.  Mouth/Throat: Oropharynx is clear and moist. No oropharyngeal exudate.  Eyes: Pupils are equal, round, and reactive to light. Conjunctivae and EOM are normal. Right eye exhibits no discharge. Left eye exhibits no discharge. No scleral icterus.  Neck: Normal range of motion. Neck supple. No  JVD present. No thyromegaly present.  Cardiovascular: Regular rhythm and intact distal pulses.  Exam reveals no gallop and no friction rub.   Murmur ( soft murmur) heard. Tachycardia  Pulmonary/Chest: Effort normal and breath sounds normal. No respiratory distress. She has no wheezes. She has no rales.  Abdominal: Soft. Bowel sounds are normal. She exhibits no distension and no mass. There is no tenderness.  Musculoskeletal: Normal range of motion. She exhibits edema ( subtle bilateral pitting edema - symmetrical). She exhibits no tenderness.  Lymphadenopathy:    She has no cervical adenopathy.  Neurological: She is alert. Coordination normal.  The patient has normal strength in all 4 extremities as tested by individual extremities. She has normal cranial nerves III through XII, her speech is slightly slurred but she is alert and oriented and giving appropriate answers. Normal finger-nose-finger, no pronator drift  Skin: Skin is warm and dry. No rash noted. No erythema.  Psychiatric: She  has a normal mood and affect. Her behavior is normal.  Nursing note and vitals reviewed.    ED Treatments / Results  Labs (all labs ordered are listed, but only abnormal results are displayed) Labs Reviewed  CBC - Abnormal; Notable for the following:       Result Value   Hemoglobin 11.4 (*)    HCT 35.4 (*)    All other components within normal limits  COMPREHENSIVE METABOLIC PANEL - Abnormal; Notable for the following:    Potassium 3.0 (*)    BUN 26 (*)    Calcium 8.6 (*)    All other components within normal limits  URINALYSIS, ROUTINE W REFLEX MICROSCOPIC - Abnormal; Notable for the following:    Ketones, ur 5 (*)    Leukocytes, UA TRACE (*)    All other components within normal limits  URINE CULTURE  ETHANOL  PROTIME-INR  APTT  DIFFERENTIAL  RAPID URINE DRUG SCREEN, HOSP PERFORMED  TROPONIN I  I-STAT CG4 LACTIC ACID, ED    EKG  EKG Interpretation  Date/Time:  Monday July 21 2017 18:07:58 EDT Ventricular Rate:  116 PR Interval:    QRS Duration: 94 QT Interval:  349 QTC Calculation: 485 R Axis:   39 Text Interpretation:  Sinus or ectopic atrial tachycardia Borderline low voltage, extremity leads Since last tracing rate faster ectopic atrial rhythm now present Abnormal ekg Confirmed by Noemi Chapel [phone]) on 07/21/2017 7:53:40 PM       Radiology Ct Head Wo Contrast  Result Date: 07/21/2017 CLINICAL DATA:  Dysuria and decreased oral intake.  Slurred speech. EXAM: CT HEAD WITHOUT CONTRAST TECHNIQUE: Contiguous axial images were obtained from the base of the skull through the vertex without intravenous contrast. COMPARISON:  06/10/2017 FINDINGS: Brain: Stable mild cerebral atrophy. There is extensive low density throughout the white matter that is chronic. No evidence for acute hemorrhage, mass lesion, midline shift, hydrocephalus or large infarct. Vascular: No hyperdense vessel or unexpected calcification. Skull: Normal. Negative for fracture or focal lesion.  Sinuses/Orbits: Small amount of disease in the left ethmoid air cells. Other: None. IMPRESSION: No acute intracranial abnormality. Extensive white matter disease appears chronic and probably represents chronic small vessel ischemic changes. Electronically Signed   By: Markus Daft M.D.   On: 07/21/2017 19:01    Procedures Procedures (including critical care time)  Medications Ordered in ED Medications  sodium chloride 0.9 % bolus 1,000 mL (not administered)  aztreonam (AZACTAM) 1 g in dextrose 5 % 50 mL IVPB (not administered)     Initial Impression /  Assessment and Plan / ED Course  I have reviewed the triage vital signs and the nursing notes.  Pertinent labs & imaging results that were available during my care of the patient were reviewed by me and considered in my medical decision making (see chart for details).     The patient has been persistently tachycardic, she has some mild hypokalemia and no leukocytosis. Urinary findings are consistent with an infection, CT scan does not reveal any signs of stroke  Discussed this with the hospitalist will admit for further workup including possible stroke workup and treatment of urinary tract infection. Given patient's allergies the best anabolic was chosen given resources   Final Clinical Impressions(s) / ED Diagnoses   Final diagnoses:  Slurred speech  Urinary tract infection without hematuria, site unspecified    New Prescriptions New Prescriptions   No medications on file     Noemi Chapel, MD 07/21/17 2145

## 2017-07-21 NOTE — ED Triage Notes (Signed)
Pt brought in by [facility] with c/o dysuria, decrease in oral intake x 2 days. Daughter noticed slurred speech around 1000 today. No unilateral weakness for EMS. EMS reports pt's heart rhythm is junctional tachycardia with a rate of around 118bpm. Pt has hx of A. Fib. In triage - pt's smile is symmetrical, grip strength normal bilaterally, no arm or left drift.

## 2017-07-21 NOTE — H&P (Signed)
History and Physical    Sara Garcia:[id number] DOB: [birth year] DOA: 07/21/2017  PCP: Doree Albee, MD Consultants:  [facility] - cardiology; [place] - orthopedics; [facility] - GI; [place] - GYN Patient coming from:  Home - lives with husband; NOK: daughter, [phone]  Chief Complaint: dysarthria  HPI: Sara Garcia is a 83 y.o. female with medical history significant of recent left hip fracture s/p repair; HTN; GERD; DVT; and afib on Eliquis presenting with dysarthria.  She hasn't been feeling well for 5-6 days - nausea, diarrhea and all.  Yesterday, she thought she had a UTI - her personality was different and she didn't feel well.    This AM, she was complaining of abdominal pain.  Later this afternoon, her speech was very slurred.  +dysuria day before yesterday.  Wasn't peeing as much.  Complaining about her back hurting very much, different from her usual but still midline.  No known fevers.  She is having epigastric pain, RUQ vs. LUQ - hard to tell.  Has h/o hiatal hernia.  Her therapist was there this AM and she developed aphasia/slurring of speech at that time.  No dysphagia.  No N/W/T of arms or legs.  She has noticed a bit of edema and tightness along her left calf.  She had thrush after her surgery and so family requests Nystatin swish and swallow.  Hip surgery was August 29, went to [facility] for rehab. She was in rehab for 20 days and then returned home for more therapy 2 weeks ago.     ED Course: Persistent tachycardia, mild hypokalemia, no leukocytosis.  UTI.  Head CT negative for acute CVA.  Review of Systems: As per HPI; otherwise review of systems reviewed and negative.  This is somewhat suspect due to her dysarthria.  Ambulatory Status:  Non weight-bearing, toe tap only from recent hip surgery  Past Medical History:  Diagnosis Date  . A-fib ([place])   . Arthritis   . Asthma   . DVT (deep venous thrombosis) ([place]) 2011/2012   on Eliquis  . Esophageal dysphagia    limited food  and pill  . Esophageal reflux   . Essential hypertension, benign   . GERD (gastroesophageal reflux disease)   . Osteoporosis   . Unspecified prolapse of vaginal walls     Past Surgical History:  Procedure Laterality Date  . ABDOMINAL HYSTERECTOMY    . COLONOSCOPY W/ BIOPSIES  02/01/2004   RMR: Anal papilla with normal rectum/Sigmoid diverticula/Small polyps in the cecum removed as described above. Inflammatory polyp  . ESOPHAGEAL DILATION     unknown year per patient.  . ESOPHAGEAL DILATION N/A 02/07/2015   RMR: Normal esophagus status post maloney dilation. Small hiatal hernia.   Marland Kitchen ESOPHAGOGASTRODUODENOSCOPY     unknown year per patient  . ESOPHAGOGASTRODUODENOSCOPY N/A 02/07/2015   RMR: Normal esophagus  status post  Maloney dilation. Small hiatal hernia  . FEMUR IM NAIL Left 06/11/2017   Procedure: INTRAMEDULLARY (IM) NAIL FEMORAL;  Surgeon: Rod Can, MD;  Location: [facility];  Service: Orthopedics;  Laterality: Left;  . NASAL SEPTUM SURGERY    . TONSILLECTOMY    . WRIST FRACTURE SURGERY      Social History   Social History  . Marital status: Married    Spouse name: N/A  . Number of children: N/A  . Years of education: N/A   Occupational History  . Not on file.   Social History Main Topics  . Smoking status: Never Smoker  .  Smokeless tobacco: Never Used  . Alcohol use No  . Drug use: No  . Sexual activity: Not Currently    Birth control/ protection: None   Other Topics Concern  . Not on file   Social History Narrative  . No narrative on file    Allergies  Allergen Reactions  . Latex Itching and Rash  . Adhesive [Tape] Other (See Comments)    Tears skin  . Betadine [Povidone Iodine]     blisters  . Ciprofloxacin Nausea And Vomiting  . Codeine Nausea And Vomiting    Upsets stomach, nightmares  . Penicillins Hives    Has patient had a PCN reaction causing immediate rash, facial/tongue/throat swelling, SOB or lightheadedness with hypotension: yes Has  patient had a PCN reaction causing severe rash involving mucus membranes or skin necrosis: yes Has patient had a PCN reaction that required hospitalization: no Has patient had a PCN reaction occurring within the last 10 years: no If all of the above answers are "NO", then may proceed with Cephalosporin use.   . Sulfa Antibiotics Hives  . Wellbutrin [Bupropion]     Reaction unknown    Family History  Problem Relation Age of Onset  . Colon polyps Neg Hx   . Colon cancer Neg Hx     Prior to Admission medications   Medication Sig Start Date End Date Taking? Authorizing Provider  acetaminophen (TYLENOL) 500 MG tablet Take 1,000 mg by mouth 2 (two) times daily as needed for mild pain, moderate pain or headache.    Yes [provider]  apixaban (ELIQUIS) 5 MG TABS tablet Take 1 tablet (5 mg total) by mouth 2 (two) times daily. 01/14/17  Yes BranchAlphonse Guild, MD  Calcium Carb-Cholecalciferol (CALCIUM-VITAMIN D) 500-200 MG-UNIT tablet Take 1 tablet by mouth daily.   Yes [provider]  cyanocobalamin 2000 MCG tablet Take 2,000 mcg by mouth daily.   Yes [provider]  diltiazem (CARDIZEM) 30 MG tablet Take 1 tablet (30 mg total) by mouth 2 (two) times daily. 02/24/17  Yes BranchAlphonse Guild, MD  DULoxetine (CYMBALTA) 60 MG capsule Take 60 mg by mouth 2 (two) times daily.    Yes [provider]  esomeprazole (NEXIUM) 20 MG capsule Take 1 capsule (20 mg total) by mouth daily at 12 noon. 11/06/16  Yes Carlis Stable, NP  Fluticasone-Salmeterol (ADVAIR) 250-50 MCG/DOSE AEPB Inhale 1 puff into the lungs daily.    Yes [provider]  metoprolol tartrate (LOPRESSOR) 25 MG tablet Take 1.5 tablets (37.5 mg total) by mouth 2 (two) times daily. 02/24/17  Yes Branch, Alphonse Guild, MD  polyethylene glycol powder (GLYCOLAX/MIRALAX) powder MIX 17 GRAMS WITH LIQUID AND TAKE BY MOUTH ONCE DAILY Patient taking differently: MIX 17 GRAMS WITH LIQUID AND TAKE BY MOUTH DAILY AS  NEEDED FOR CONSTIPATION. 12/13/16  Yes Mahala Menghini, PA-C  pyridOXINE (VITAMIN B-6) 100 MG tablet Take 100 mg by mouth daily.   Yes [provider]  HYDROcodone-acetaminophen (NORCO/VICODIN) 5-325 MG tablet Take 1-2 tablets by mouth every 6 (six) hours as needed for moderate pain. Patient not taking: Reported on 07/21/2017 06/13/17   Theodis Blaze, MD  nitrofurantoin, macrocrystal-monohydrate, (MACROBID) 100 MG capsule Take 1 capsule (100 mg total) by mouth every 12 (twelve) hours. Patient not taking: Reported on 07/21/2017 06/16/17   Theodis Blaze, MD    Physical Exam: Vitals:   07/21/17 2030 07/21/17 2100 07/21/17 2130 07/21/17 2137  BP: (!) 153/89 (!) 157/75  Marland Kitchen)  162/94  Pulse: (!) 123 (!) 123  (!) 124  Resp: 16 (!) 26  (!) 26  Temp:   98.6 F (37 C)   TempSrc:      SpO2: 98% 96%  95%  Weight:      Height:         General:  Appears calm and comfortable and is NAD Eyes:  PERRL, EOMI, normal lids, iris ENT:  grossly normal hearing, lips & tongue, mmm Neck:  no LAD, masses or thyromegaly; no carotid bruits Cardiovascular:  RRR, no m/r/g. No LE edema.  Respiratory:   RLL rhonchi.  Normal respiratory effort. Abdomen:  soft, NT, ND, NABS Back:   normal alignment, no CVAT Skin:  Mild erythema/edema of left calf with firm cords along posterior aspect Musculoskeletal:  grossly normal tone BUE/BLE, good ROM, no bony abnormality.  Hip feels solid with good flexibility. Lower extremity:  No RLE edema, mild LLE fullness and erythema as above.  Limited foot exam with no ulcerations.  Psychiatric:  grossly normal mood and affect, speech dysarthric with intermittent expressive aphasia Neurologic:  CN 2-12 grossly intact, moves all extremities in coordinated fashion, sensation intact    Radiological Exams on Admission: Dg Chest 1 View  Result Date: 07/21/2017 CLINICAL DATA:  Tachycardia.  Encephalopathy. EXAM: CHEST 1 VIEW COMPARISON:  06/10/2017 FINDINGS: Shallow inspiration.  Minor linear scarring in the bases. No airspace consolidation. No effusions. Normal pulmonary vasculature. Mediastinal and cardiac contours are unremarkable and unchanged. IMPRESSION: No acute cardiopulmonary findings. Electronically Signed   By: Andreas Newport M.D.   On: 07/21/2017 22:25   Ct Head Wo Contrast  Result Date: 07/21/2017 CLINICAL DATA:  Dysuria and decreased oral intake.  Slurred speech. EXAM: CT HEAD WITHOUT CONTRAST TECHNIQUE: Contiguous axial images were obtained from the base of the skull through the vertex without intravenous contrast. COMPARISON:  06/10/2017 FINDINGS: Brain: Stable mild cerebral atrophy. There is extensive low density throughout the white matter that is chronic. No evidence for acute hemorrhage, mass lesion, midline shift, hydrocephalus or large infarct. Vascular: No hyperdense vessel or unexpected calcification. Skull: Normal. Negative for fracture or focal lesion. Sinuses/Orbits: Small amount of disease in the left ethmoid air cells. Other: None. IMPRESSION: No acute intracranial abnormality. Extensive white matter disease appears chronic and probably represents chronic small vessel ischemic changes. Electronically Signed   By: Markus Daft M.D.   On: 07/21/2017 19:01    EKG: Independently reviewed.  Sinus tachycardia with rate 116;  no evidence of acute ischemia   Labs on Admission: I have personally reviewed the available labs and imaging studies at the time of the admission.  Pertinent labs:   K+ 3.0 BUN 26/Creatinine 0.69 - stable Troponin <0.03 WBC 8.6 Hgb 11.4 - improved UA: 5 ketones, trace LE, no bacteria, TNTC WBC ETOH and UDS negative  Assessment/Plan Principal Problem:   Dysarthria Active Problems:   Essential hypertension   Chronic anticoagulation   Tachycardia   Dysarthria -Primary concerns are for infectious etiology or CVA -She has no other symptoms suggestive of CVA but this does not preclude it as a potential  diagnosis -Concerning for TIA/CVA -Will admit to SDU for CVA/TIA evaluation -Telemetry monitoring -MRI - if negative, no further CVA evaluation/neuro consult based on discussion with family -If MRI is positive for CVA, will need remainder of CVA evaluation to include Carotid dopplers; Echo; and neurology consult. -Risk stratification with FLP; will also check TSH  -ASA daily -PT/OT/ST Consults  HTN -Allow permissive HTN -Treat BP  only if >220/120, and then with goal of 15% reduction -Hold Cardizem and Metoprolol and plan to restart in 48-72 hours or when MRI is negative  Tachycardia -While there is a small chance that her tachycardia is from beta blocker withdrawal, since she was only due to take evening meds at 6pm this seems less likely -Instead, the other concern for this patient is that her dysarthria and tachycardia are related to an infectious etiology, possible sepsis -Normal WBC count, no fever, but tachycardia and tachypnea -Lactate is pending -While awaiting blood and urine cultures, this may be a preseptic condition. -Sepsis protocol initiated -If sepsis, suspect urinary source despite relatively normal UA - she has otherwise no abnormal PE or lab/Xray findings -Blood and urine cultures pending -Given her multitude of allergies, she was given Aztreonam in the ER -Will hold additional antibiotics at this time and monitor in SDU  -If fever, leukocytosis develops or if lactate returns elevated, antibiotics should be continued and may need to be broadened for undifferentiated sepsis -Will trend lactate to ensure improvement -Will order procalcitonin level.  Antibiotics would not be indicated for PCT <0.1 and probably should not be used for < 0.25.  >0.5 indicates infection and >>0.5 indicates more serious disease.  As the procalcitonin level normalizes, it will be reasonable to consider de-escalation of antibiotic coverage. -Nystatin swish and swallow ordered at the request of the  family since patient often develops oral thrush from antibiotics  Chronic anticoagulation -Patient was previously on warfarin but did not have adequate INR until she was changed to trade name only Coumadin -She remains anticoagulated for afib and h/o DVTs but does have LLE edema/erythema with calf tightness and apparently palpable cords concerning for LE DVT -If the patient develops a recurrent clot despite Eliquis, she will either need to change to an alternate novel anticoagulant or resume trade name only Coumadin dosing   DVT prophylaxis: Eliquis Code Status:  DNR - confirmed with patient/family Family Communication: Daughters and husband present for evaluation  Disposition Plan:  Home once clinically improved Consults called: PT/OT/ST  Admission status: Admit - It is my clinical opinion that admission to INPATIENT is reasonable and necessary because this patient will require at least 2 midnights in the hospital to treat this condition based on the medical complexity of the problems presented.  Given the aforementioned information, the predictability of an adverse outcome is felt to be significant.    Karmen Bongo MD [facility]  If note is complete, please contact covering daytime or nighttime physician. [email or web address] Password [email or web address]  07/21/2017, 10:33 PM

## 2017-07-22 ENCOUNTER — Inpatient Hospital Stay (HOSPITAL_COMMUNITY): Payer: Medicare HMO

## 2017-07-22 ENCOUNTER — Other Ambulatory Visit: Payer: Self-pay

## 2017-07-22 DIAGNOSIS — N390 Urinary tract infection, site not specified: Secondary | ICD-10-CM

## 2017-07-22 DIAGNOSIS — I371 Nonrheumatic pulmonary valve insufficiency: Secondary | ICD-10-CM

## 2017-07-22 DIAGNOSIS — I10 Essential (primary) hypertension: Secondary | ICD-10-CM

## 2017-07-22 DIAGNOSIS — R600 Localized edema: Secondary | ICD-10-CM

## 2017-07-22 DIAGNOSIS — R000 Tachycardia, unspecified: Secondary | ICD-10-CM

## 2017-07-22 DIAGNOSIS — Z7901 Long term (current) use of anticoagulants: Secondary | ICD-10-CM

## 2017-07-22 DIAGNOSIS — I361 Nonrheumatic tricuspid (valve) insufficiency: Secondary | ICD-10-CM

## 2017-07-22 LAB — CBC
HCT: 34.6 % — ABNORMAL LOW (ref 36.0–46.0)
Hemoglobin: 10.8 g/dL — ABNORMAL LOW (ref 12.0–15.0)
MCH: 28.4 pg (ref 26.0–34.0)
MCHC: 31.2 g/dL (ref 30.0–36.0)
MCV: 91.1 fL (ref 78.0–100.0)
Platelets: 272 10*3/uL (ref 150–400)
RBC: 3.8 MIL/uL — ABNORMAL LOW (ref 3.87–5.11)
RDW: 15.4 % (ref 11.5–15.5)
WBC: 8.7 10*3/uL (ref 4.0–10.5)

## 2017-07-22 LAB — LIPID PANEL
Cholesterol: 132 mg/dL (ref 0–200)
HDL: 50 mg/dL (ref >40)
LDL Cholesterol: 64 mg/dL (ref 0–99)
Total CHOL/HDL Ratio: 2.6 RATIO
Triglycerides: 88 mg/dL (ref <150)
VLDL: 18 mg/dL (ref 0–40)

## 2017-07-22 LAB — BASIC METABOLIC PANEL
Anion gap: 10 (ref 5–15)
BUN: 18 mg/dL (ref 6–20)
CO2: 22 mmol/L (ref 22–32)
Calcium: 8 mg/dL — ABNORMAL LOW (ref 8.9–10.3)
Chloride: 106 mmol/L (ref 101–111)
Creatinine, Ser: 0.63 mg/dL (ref 0.44–1.00)
GFR calc Af Amer: >60 mL/min (ref >60)
GFR calc non Af Amer: >60 mL/min (ref >60)
Glucose, Bld: 149 mg/dL — ABNORMAL HIGH (ref 65–99)
Potassium: 3.5 mmol/L (ref 3.5–5.1)
Sodium: 138 mmol/L (ref 135–145)

## 2017-07-22 LAB — ECHOCARDIOGRAM COMPLETE
Height: 62 in
Weight: 2331.58 oz

## 2017-07-22 LAB — MRSA PCR SCREENING: MRSA by PCR: NEGATIVE

## 2017-07-22 LAB — MAGNESIUM: Magnesium: 2 mg/dL (ref 1.7–2.4)

## 2017-07-22 MED ORDER — METOPROLOL TARTRATE 5 MG/5ML IV SOLN
2.5000 mg | Freq: Once | INTRAVENOUS | Status: AC
  Administered 2017-07-22: 2.5 mg via INTRAVENOUS
  Filled 2017-07-22: qty 5

## 2017-07-22 MED ORDER — METOPROLOL TARTRATE 5 MG/5ML IV SOLN
5.0000 mg | INTRAVENOUS | Status: AC
  Administered 2017-07-22: 5 mg via INTRAVENOUS
  Filled 2017-07-22: qty 5

## 2017-07-22 MED ORDER — DILTIAZEM HCL 25 MG/5ML IV SOLN
5.0000 mg | Freq: Once | INTRAVENOUS | Status: AC
Start: 2017-07-22 — End: 2017-07-22
  Administered 2017-07-22: 5 mg via INTRAVENOUS
  Filled 2017-07-22: qty 5

## 2017-07-22 MED ORDER — DILTIAZEM HCL 30 MG PO TABS
30.0000 mg | ORAL_TABLET | Freq: Two times a day (BID) | ORAL | Status: DC
  Administered 2017-07-22 – 2017-07-24 (×5): 30 mg via ORAL
  Filled 2017-07-22 (×5): qty 1

## 2017-07-22 MED ORDER — POTASSIUM CHLORIDE CRYS ER 20 MEQ PO TBCR
40.0000 meq | EXTENDED_RELEASE_TABLET | Freq: Once | ORAL | Status: AC
  Administered 2017-07-22: 40 meq via ORAL
  Filled 2017-07-22: qty 2

## 2017-07-22 MED ORDER — ONDANSETRON HCL 4 MG/2ML IJ SOLN
4.0000 mg | Freq: Four times a day (QID) | INTRAMUSCULAR | Status: DC | PRN
  Administered 2017-07-22: 4 mg via INTRAVENOUS
  Filled 2017-07-22: qty 2

## 2017-07-22 MED ORDER — PERFLUTREN LIPID MICROSPHERE
1.0000 mL | INTRAVENOUS | Status: AC | PRN
  Administered 2017-07-22: 2 mL via INTRAVENOUS
  Filled 2017-07-22: qty 10

## 2017-07-22 MED ORDER — MAGNESIUM SULFATE 2 GM/50ML IV SOLN
2.0000 g | Freq: Once | INTRAVENOUS | Status: AC
  Administered 2017-07-22: 2 g via INTRAVENOUS
  Filled 2017-07-22: qty 50

## 2017-07-22 MED ORDER — DEXTROSE 5 % IV SOLN
1.0000 g | Freq: Three times a day (TID) | INTRAVENOUS | Status: DC
  Administered 2017-07-22 – 2017-07-24 (×8): 1 g via INTRAVENOUS
  Filled 2017-07-22 (×11): qty 1

## 2017-07-22 MED ORDER — METOPROLOL TARTRATE 5 MG/5ML IV SOLN: 5.0000 mg | Freq: Once | INTRAVENOUS | Status: DC

## 2017-07-22 MED ORDER — METOPROLOL TARTRATE 5 MG/5ML IV SOLN
5.0000 mg | Freq: Once | INTRAVENOUS | Status: AC
  Administered 2017-07-22: 5 mg via INTRAVENOUS
  Filled 2017-07-22: qty 5

## 2017-07-22 MED ORDER — POTASSIUM CHLORIDE IN NACL 20-0.45 MEQ/L-% IV SOLN
INTRAVENOUS | Status: AC
  Administered 2017-07-22: 02:00:00 via INTRAVENOUS
  Filled 2017-07-22: qty 1000

## 2017-07-22 MED ORDER — DIGOXIN 0.25 MG/ML IJ SOLN
0.2500 mg | Freq: Once | INTRAMUSCULAR | Status: AC
  Administered 2017-07-22: 0.25 mg via INTRAVENOUS
  Filled 2017-07-22: qty 2

## 2017-07-22 NOTE — Progress Notes (Signed)
13 Dr.Doonquah's office [facility]) notified and message left with Miles Costain regarding new neurology consult for patient d/t stroke.

## 2017-07-22 NOTE — Progress Notes (Addendum)
PROGRESS NOTE    Sara Garcia  YDX:[id number] DOB: 1927/11/09 DOA: 07/21/2017 PCP: Doree Albee, MD    Brief Narrative:  83 -year-old female with recent left hip fracture, history of DVT and atrial fibrillation on anticoagulation, presented with dysarthria. She is undergoing workup for possible stroke versus UTI.   Assessment & Plan:   Principal Problem:   Dysarthria Active Problems:   Essential hypertension   Chronic anticoagulation   Tachycardia   1. Dysarthria. Unclear etiology. Infectious versus possible CVA. MRI has been ordered. Urinalysis indicates possible infection. Urine culture in process. 2. Hypertension. Allow permissive hypertension. Metoprolol currently on hold. Will resume his MRI is negative. 3. Sinus tachycardia. Patient has had tachycardia throughout the night. She's been receiving intermittent doses of intravenous metoprolol and Cardizem. She is on low-dose oral Cardizem at home. This will be resumed for now. 4. Possible UTI. She is currently on intravenous antibiotics. Culture process. 5. Left lower shin swelling. Concerns for underlying DVT. Venous Dopplers have been ordered. She is already anticoagulated with apixaban. If Dopplers are positive for DVT, we'll need to readdress anticoagulation.   DVT prophylaxis: apixaban Code Status: DNR Family Communication: no family present Disposition Plan: pending hospital course   Consultants:     Procedures:     Antimicrobials:   Aztreonam 10/8>    Subjective: No chest pain or shortness of breath. Still having difficulty with speech  Objective: Vitals:   07/22/17 0545 07/22/17 0600 07/22/17 0615 07/22/17 0630  BP: (!) 158/100 (!) 170/81 (!) 172/83 (!) 141/86  Pulse: (!) 117 89 93 94  Resp: 19 19 (!) 23 (!) 23  Temp:    98.4 F (36.9 C)  TempSrc:      SpO2: 91% 93% 95% 94%  Weight:    66.1 kg (145 lb 11.6 oz)  Height:    5\' 2"  (1.575 m)    Intake/Output Summary (Last 24 hours) at 07/22/17  0835 Last data filed at 07/22/17 0648  Gross per 24 hour  Intake           635.42 ml  Output              450 ml  Net           185.42 ml   Filed Weights   07/21/17 1801 07/22/17 0630  Weight: 71.2 kg (157 lb) 66.1 kg (145 lb 11.6 oz)    Examination:  General exam: Appears calm and comfortable  Respiratory system: Clear to auscultation. Respiratory effort normal. Cardiovascular system: S1 & S2 heard, RRR. No JVD, murmurs, rubs, gallops or clicks. Gastrointestinal system: Abdomen is nondistended, soft and nontender. No organomegaly or masses felt. Normal bowel sounds heard. Central nervous system: Alert and oriented. Remains dysarthric, no other focal deficits Extremities: Symmetric 5 x 5 power. L>R LE edema with warmth Skin: No rashes, lesions or ulcers Psychiatry: Judgement and insight appear normal. Mood & affect appropriate.     Data Reviewed: I have personally reviewed following labs and imaging studies  CBC:  Recent Labs Lab 07/21/17 1924  WBC 8.6  NEUTROABS 3.9  HGB 11.4*  HCT 35.4*  MCV 89.8  PLT 720   Basic Metabolic Panel:  Recent Labs Lab 07/21/17 1924 07/21/17 2317  NA 142  --   K 3.0*  --   CL 105  --   CO2 26  --   GLUCOSE 96  --   BUN 26*  --   CREATININE 0.69  --   CALCIUM 8.6*  --  MG  --  2.0   GFR: Estimated Creatinine Clearance: 44.2 mL/min (by C-G formula based on SCr of 0.69 mg/dL). Liver Function Tests:  Recent Labs Lab 07/21/17 1924  AST 23  ALT 17  ALKPHOS 126  BILITOT 0.8  PROT 7.0  ALBUMIN 3.5   No results for input(s): LIPASE, AMYLASE in the last 168 hours. No results for input(s): AMMONIA in the last 168 hours. Coagulation Profile:  Recent Labs Lab 07/21/17 1924  INR 1.21   Cardiac Enzymes:  Recent Labs Lab 07/21/17 1924  TROPONINI <0.03   BNP (last 3 results) No results for input(s): PROBNP in the last 8760 hours. HbA1C: No results for input(s): HGBA1C in the last 72 hours. CBG: No results for  input(s): GLUCAP in the last 168 hours. Lipid Profile:  Recent Labs  07/22/17 0358  CHOL 132  HDL 50  LDLCALC 64  TRIG 88  CHOLHDL 2.6   Thyroid Function Tests:  Recent Labs  07/21/17 1924  TSH 1.163   Anemia Panel: No results for input(s): VITAMINB12, FOLATE, FERRITIN, TIBC, IRON, RETICCTPCT in the last 72 hours. Sepsis Labs:  Recent Labs Lab 07/21/17 1936 07/21/17 2235 07/21/17 2317  PROCALCITON <0.10  --   --   LATICACIDVEN 1.4 1.56 1.1    Recent Results (from the past 240 hour(s))  MRSA PCR Screening     Status: None   Collection Time: 07/21/17 11:01 PM  Result Value Ref Range Status   MRSA by PCR NEGATIVE NEGATIVE Final    Comment:        The GeneXpert MRSA Assay (FDA approved for NASAL specimens only), is one component of a comprehensive MRSA colonization surveillance program. It is not intended to diagnose MRSA infection nor to guide or monitor treatment for MRSA infections.   Culture, blood (x 2)     Status: None (Preliminary result)   Collection Time: 07/21/17 11:10 PM  Result Value Ref Range Status   Specimen Description BLOOD RIGHT ARM  Final   Special Requests   Final    BOTTLES DRAWN AEROBIC AND ANAEROBIC Blood Culture adequate volume   Culture NO GROWTH < 12 HOURS  Final   Report Status PENDING  Incomplete  Culture, blood (x 2)     Status: None (Preliminary result)   Collection Time: 07/21/17 11:17 PM  Result Value Ref Range Status   Specimen Description BLOOD RIGHT ARM  Final   Special Requests   Final    BOTTLES DRAWN AEROBIC AND ANAEROBIC Blood Culture adequate volume   Culture NO GROWTH < 12 HOURS  Final   Report Status PENDING  Incomplete         Radiology Studies: Dg Chest 1 View  Result Date: 07/21/2017 CLINICAL DATA:  Tachycardia.  Encephalopathy. EXAM: CHEST 1 VIEW COMPARISON:  06/10/2017 FINDINGS: Shallow inspiration. Minor linear scarring in the bases. No airspace consolidation. No effusions. Normal pulmonary  vasculature. Mediastinal and cardiac contours are unremarkable and unchanged. IMPRESSION: No acute cardiopulmonary findings. Electronically Signed   By: Andreas Newport M.D.   On: 07/21/2017 22:25   Ct Head Wo Contrast  Result Date: 07/21/2017 CLINICAL DATA:  Dysuria and decreased oral intake.  Slurred speech. EXAM: CT HEAD WITHOUT CONTRAST TECHNIQUE: Contiguous axial images were obtained from the base of the skull through the vertex without intravenous contrast. COMPARISON:  06/10/2017 FINDINGS: Brain: Stable mild cerebral atrophy. There is extensive low density throughout the white matter that is chronic. No evidence for acute hemorrhage, mass lesion, midline shift, hydrocephalus  or large infarct. Vascular: No hyperdense vessel or unexpected calcification. Skull: Normal. Negative for fracture or focal lesion. Sinuses/Orbits: Small amount of disease in the left ethmoid air cells. Other: None. IMPRESSION: No acute intracranial abnormality. Extensive white matter disease appears chronic and probably represents chronic small vessel ischemic changes. Electronically Signed   By: Markus Daft M.D.   On: 07/21/2017 19:01        Scheduled Meds: .  stroke: mapping our early stages of recovery book   Does not apply Once  . apixaban  5 mg Oral BID  . aspirin  300 mg Rectal Daily   Or  . aspirin  325 mg Oral Daily  . diltiazem  30 mg Oral Q12H  . DULoxetine  60 mg Oral BID  . Influenza vac split quadrivalent PF  0.5 mL Intramuscular Tomorrow-1000  . mometasone-formoterol  2 puff Inhalation BID  . nystatin  5 mL Oral QID  . pantoprazole  40 mg Oral Daily   Continuous Infusions: . 0.45 % NaCl with KCl 20 mEq / L 125 mL/hr at 07/22/17 0143     LOS: 1 day    Time spent: 86mins    MEMON,JEHANZEB, MD [facility] Pager [phone]  If 7PM-7AM, please contact night-coverage [email or web address] Password [email or web address] 07/22/2017, 8:35 AM   Addendum:  Patient's MRI brain return positive for acute  stroke. Echocardiogram and carotid Dopplers have been ordered. She also underwent MRA head that did not show any significant lesions. Neurology consultation requested. Will request speech therapy for dysarthria. Continue when necessary IV metoprolol for heart rate control. In the next 24 hours, can likely restart her oral metoprolol since this has been held for permissive hypertension.  MEMON,JEHANZEB

## 2017-07-22 NOTE — Plan of Care (Signed)
Problem: Safety: Goal: Ability to remain free from injury will improve Outcome: Progressing Patient A&O x 4, able to make needs known and use call bell when assistance needed, call bell within reach, personal items within reach, bed in lowest position, bed alarm on, progressing d/t current NWB status to patient's LEFT leg

## 2017-07-22 NOTE — Progress Notes (Signed)
Patient c/o nausea, dry heaving noted. MD notified.

## 2017-07-22 NOTE — Plan of Care (Signed)
The patient is having sinus tachycardia in the 120s to 130s. Cardizem and and metoprolol have been held to allow permissive hypertension in the setting of possible CVA. EKG shows sinus tachycardia with ectopic atrial rhythm. Earlier she was given potassium replacement, magnesium sulfate 2 g IVPB, 2.5 mg of metoprolol and 5 mg of Cardizem IVP, which were effective temporarily. The nursing staff (Ms. Lysbeth Galas) has reported that the patient is tachycardic again. Another 2.5 mg of metoprolol IVP was given along with a single dose of digoxin 0.25 mg IVP. Continue to monitor heart rate and rhythm. Resume Cardizem and metoprolol at regular those when clinically indicated.  Tennis Must, MD

## 2017-07-22 NOTE — Evaluation (Signed)
Physical Therapy Evaluation Patient Details Name: Sara Garcia MRN: [id number] DOB: August 17, 1928 Today's Date: 07/22/2017   History of Present Illness  Sara Garcia is a 83 y.o. female with medical history significant of recent left hip fracture s/p repair; HTN; GERD; DVT; and afib on Eliquis presenting with dysarthria.  She hasn't been feeling well for 5-6 days - nausea, diarrhea and all.  Yesterday, she thought she had a UTI - her personality was different and she didn't feel well.    This AM, she was complaining of abdominal pain.  Later this afternoon, her speech was very slurred.  +dysuria day before yesterday.  Wasn't peeing as much.  Complaining about her back hurting very much, different from her usual but still midline.  No known fevers.  She is having epigastric pain, RUQ vs. LUQ - hard to tell.  Has h/o hiatal hernia.  Her therapist was there this AM and she developed aphasia/slurring of speech at that time.  No dysphagia.  No N/W/T of arms or legs.  She has noticed a bit of edema and tightness along her left calf.    Clinical Impression  Patient demonstrates good return for moving LLE when sitting up at bedside and maintaining NWB LLE while taking steps for transfers (see below).  Patient will benefit from continued physical therapy in hospital and recommended venue below to increase strength, balance, endurance for safe ADLs and gait.    Follow Up Recommendations Home health PT;Supervision/Assistance - 24 hour    Equipment Recommendations  None recommended by PT    Recommendations for Other Services       Precautions / Restrictions Precautions Precautions: Fall Restrictions Weight Bearing Restrictions: Yes LLE Weight Bearing: Non weight bearing      Mobility  Bed Mobility Overal bed mobility: Needs Assistance Bed Mobility: Supine to Sit;Sit to Supine     Supine to sit: Min guard Sit to supine: Min guard      Transfers Overall transfer level: Needs assistance Equipment  used: Rolling walker (2 wheeled) Transfers: Sit to/from [facility] Sit to Stand: Min guard Stand pivot transfers: Min assist       General transfer comment: Patient demonstrates good return for maintaining NWB LLE  Ambulation/Gait Ambulation/Gait assistance: Min assist;Mod assist Ambulation Distance (Feet): 5 Feet Assistive device: Rolling walker (2 wheeled) Gait Pattern/deviations: Decreased step length - right;Decreased stance time - right;Decreased stride length   Gait velocity interpretation: Below normal speed for age/gender General Gait Details: Patient limited to a few steps to transfer to chair secondary to fatiguing of RLE and BUE  Stairs            Wheelchair Mobility    Modified Rankin (Stroke Patients Only)       Balance Overall balance assessment: Needs assistance Sitting-balance support: No upper extremity supported;Feet supported Sitting balance-Leahy Scale: Good     Standing balance support: Bilateral upper extremity supported;During functional activity Standing balance-Leahy Scale: Fair                               Pertinent Vitals/Pain Pain Assessment: 0-10 Pain Score: 5  Pain Location: left hip Pain Descriptors / Indicators: Aching Pain Intervention(s): Limited activity within patient's tolerance;Monitored during session    [place] expects to be discharged to:: Private residence Living Arrangements: Spouse/significant other Available Help at Discharge: Family Type of Home: House Home Access: Ramped entrance;Stairs to enter Entrance Stairs-Rails: Right;Left;Can reach both  Entrance Stairs-Number of Steps: 4 Home Layout: One level Home Equipment: Walker - 2 wheels;Wheelchair - [employer];Shower seat Additional Comments: Patient states ramp installed in her home today    Prior Function Level of Independence: Needs assistance   Gait / Transfers Assistance Needed: Assisted by  family for transfers, limited for taking steps due to recent left hip surgery  ADL's / Homemaking Assistance Needed: Assisted by family        Hand Dominance   Dominant Hand: Right    Extremity/Trunk Assessment   Upper Extremity Assessment Upper Extremity Assessment: Defer to OT evaluation    Lower Extremity Assessment Lower Extremity Assessment: Overall WFL for tasks assessed;LLE deficits/detail LLE Deficits / Details: LLE grossly -3/5    Cervical / Trunk Assessment Cervical / Trunk Assessment: Normal  Communication   Communication: No difficulties;Expressive difficulties  Cognition Arousal/Alertness: Awake/alert Behavior During Therapy: WFL for tasks assessed/performed Overall Cognitive Status: Within Functional Limits for tasks assessed                                        General Comments      Exercises     Assessment/Plan    PT Assessment Patient needs continued PT services  PT Problem List Decreased strength;Decreased activity tolerance;Decreased balance;Decreased mobility       PT Treatment Interventions Gait training;Functional mobility training;Therapeutic activities;Therapeutic exercise;Patient/family education    PT Goals (Current goals can be found in the Care Plan section)  Acute Rehab PT Goals Patient Stated Goal: Return home with family to assist PT Goal Formulation: With patient/family Time For Goal Achievement: 07/29/17 Potential to Achieve Goals: Good    Frequency 7X/week   Barriers to discharge        Co-evaluation               AM-PAC PT "6 Clicks" Daily Activity  Outcome Measure Difficulty turning over in bed (including adjusting bedclothes, sheets and blankets)?: A Little Difficulty moving from lying on back to sitting on the side of the bed? : A Little Difficulty sitting down on and standing up from a chair with arms (e.g., wheelchair, bedside commode, etc,.)?: A Little Help needed moving to and from a  bed to chair (including a wheelchair)?: A Little Help needed walking in hospital room?: A Lot Help needed climbing 3-5 steps with a railing? : Total 6 Click Score: 15    End of Session   Activity Tolerance: Patient tolerated treatment well;Patient limited by fatigue Patient left: in chair;with call bell/phone within reach;with family/visitor present Nurse Communication: Mobility status PT Visit Diagnosis: Unsteadiness on feet (R26.81);Other abnormalities of gait and mobility (R26.89);Muscle weakness (generalized) (M62.81)    Time: 1610-9604 PT Time Calculation (min) (ACUTE ONLY): 26 min   Charges:   PT Evaluation $PT Eval Low Complexity: 1 Low PT Treatments $Therapeutic Activity: 23-37 mins   PT G Codes:   PT G-Codes **NOT FOR INPATIENT CLASS** Functional Assessment Tool Used: AM-PAC 6 Clicks Basic Mobility Functional Limitation: Mobility: Walking and moving around Mobility: Walking and Moving Around Current Status (V4098): At least 40 percent but less than 60 percent impaired, limited or restricted Mobility: Walking and Moving Around Goal Status [phone]): At least 40 percent but less than 60 percent impaired, limited or restricted Mobility: Walking and Moving Around Discharge Status [phone]): At least 40 percent but less than 60 percent impaired, limited or restricted    3:31 PM,  07/22/17 Lonell Grandchild, MPT Physical Therapist with [facility] [phone] office [phone] mobile phone

## 2017-07-22 NOTE — Evaluation (Signed)
Speech Language Pathology Evaluation Patient Details Name: Sara Garcia MRN: [id number] DOB: Nov 18, 1927 Today's Date: 07/22/2017 Time: 4098-1191 SLP Time Calculation (min) (ACUTE ONLY): 20 min  Problem List:  Patient Active Problem List   Diagnosis Date Noted  . Dysarthria 07/21/2017  . Tachycardia 07/21/2017  . Closed comminuted intertrochanteric fracture of left femur ([place]) 06/11/2017  . Diarrhea 01/03/2016  . IDA (iron deficiency anemia) 11/09/2015  . Dysphagia, pharyngoesophageal phase   . Hiatal hernia   . Constipation 01/16/2015  . Esophageal dysphagia 06/27/2014  . GERD (gastroesophageal reflux disease) 06/27/2014  . Personal history of colonic polyps 06/27/2014  . Chronic anticoagulation 06/27/2014  . Primary osteoarthritis of right knee 05/09/2014  . DISH (diffuse idiopathic skeletal hyperostosis) 05/09/2014  . DDD (degenerative disc disease), lumbosacral 05/09/2014  . Prolapse of vaginal vault after hysterectomy 04/07/2014  . Atrial fibrillation ([place]) 01/10/2014  . Radiculopathy 01/10/2014  . Essential hypertension 12/21/2013   Past Medical History:  Past Medical History:  Diagnosis Date  . A-fib ([place])   . Arthritis   . Asthma   . DVT (deep venous thrombosis) ([place]) 2011/2012   on Eliquis  . Esophageal dysphagia    limited food and pill  . Esophageal reflux   . Essential hypertension, benign   . GERD (gastroesophageal reflux disease)   . Osteoporosis   . Unspecified prolapse of vaginal walls    Past Surgical History:  Past Surgical History:  Procedure Laterality Date  . ABDOMINAL HYSTERECTOMY    . COLONOSCOPY W/ BIOPSIES  02/01/2004   RMR: Anal papilla with normal rectum/Sigmoid diverticula/Small polyps in the cecum removed as described above. Inflammatory polyp  . ESOPHAGEAL DILATION     unknown year per patient.  . ESOPHAGEAL DILATION N/A 02/07/2015   RMR: Normal esophagus status post maloney dilation. Small hiatal hernia.   Marland Kitchen ESOPHAGOGASTRODUODENOSCOPY      unknown year per patient  . ESOPHAGOGASTRODUODENOSCOPY N/A 02/07/2015   RMR: Normal esophagus  status post  Maloney dilation. Small hiatal hernia  . FEMUR IM NAIL Left 06/11/2017   Procedure: INTRAMEDULLARY (IM) NAIL FEMORAL;  Surgeon: Rod Can, MD;  Location: [facility];  Service: Orthopedics;  Laterality: Left;  . NASAL SEPTUM SURGERY    . TONSILLECTOMY    . WRIST FRACTURE SURGERY     HPI:  Sara Garcia is an 83 -year-old female with recent left hip fracture (06/10/2017), history of DVT and atrial fibrillation on anticoagulation, presented with dysarthria. MRI shows: Acute white matter infarction in the left posterior frontal region measuring about 8 x 18 mm. Chest x-ray was negative yesterday for acute changes. Pt passed RN swallow screen. SLE ordered as part of stroke protocol.    Assessment / Plan / Recommendation Clinical Impression  Pt presents with mild/mod dysarthria which negatively impacts expressive speech skills at the conversation level. Pt and family do not feel as if there has been improvement in speech since onset, however Pt was noted to attempt to utilize compensatory strategies which has improved overall intelligibility. Pt/family report that Pt is at baseline in regards to cognition and language. Recommend f/u SLP services to address dysarthria and possible mild expressive language deficits via home health. Pt/family in agreement with plan of care.     SLP Assessment  SLP Recommendation/Assessment: All further Speech Lanaguage Pathology  needs can be addressed in the next venue of care SLP Visit Diagnosis: Dysarthria and anarthria (R47.1)    Follow Up Recommendations  Home health SLP    Frequency and Duration min 2x/week  1 week      SLP Evaluation Cognition  Overall Cognitive Status: Within Functional Limits for tasks assessed Arousal/Alertness: Awake/alert Orientation Level: Oriented X4 Memory: Appears intact Awareness: Appears intact Problem Solving: Appears  intact Safety/Judgment: Appears intact       Comprehension  Auditory Comprehension Overall Auditory Comprehension: Appears within functional limits for tasks assessed Yes/No Questions: Within Functional Limits Commands: Within Functional Limits Conversation: Simple Interfering Components: Hearing EffectiveTechniques: Increased volume Visual Recognition/Discrimination Discrimination: Within Function Limits Reading Comprehension Reading Status: Not tested    Expression Expression Primary Mode of Expression: Verbal Verbal Expression Overall Verbal Expression: Impaired Initiation: No impairment Automatic Speech: Name;Social Response;Day of week;Month of year Level of Generative/Spontaneous Verbalization: Conversation Repetition: Impaired Level of Impairment: Sentence level Naming: No impairment Pragmatics: No impairment Interfering Components: Speech intelligibility Non-Verbal Means of Communication: Not applicable Written Expression Dominant Hand: Right Written Expression: Not tested   Oral / Motor  Oral Motor/Sensory Function Overall Oral Motor/Sensory Function: Mild impairment Facial ROM: Within Functional Limits Facial Symmetry: Within Functional Limits Facial Strength: Within Functional Limits Facial Sensation: Within Functional Limits Lingual ROM: Reduced right;Reduced left;Suspected CN XII (hypoglossal) dysfunction Lingual Symmetry: Within Functional Limits Lingual Strength: Reduced Lingual Sensation: Within Functional Limits Velum: Within Functional Limits Mandible: Within Functional Limits Motor Speech Overall Motor Speech: Impaired Respiration: Within functional limits Phonation: Normal Resonance: Within functional limits Articulation: Impaired Level of Impairment: Word Intelligibility: Intelligibility reduced Word: 75-100% accurate Phrase: 75-100% accurate Sentence: 75-100% accurate Conversation: 50-74% accurate Motor Planning: Witnin functional  limits Motor Speech Errors: Aware Interfering Components:  (Pt wears U/L dentures) Effective Techniques: Slow rate;Pause    Thank you,  Sara Garcia, [place]            Sara Garcia 07/22/2017, 4:32 PM

## 2017-07-22 NOTE — Progress Notes (Signed)
*  PRELIMINARY RESULTS* Echocardiogram 2D Echocardiogram with definity has been performed.  Leavy Cella 07/22/2017, 3:00 PM

## 2017-07-22 NOTE — Progress Notes (Signed)
Pharmacy Antibiotic Note  Sara Garcia is a 83 y.o. female admitted on 07/21/2017 with UTI.  Pharmacy has been consulted for Aztreonam dosing.  Plan: Aztreonam 1gm IV q8h F/U cxs and clinical progress Monitor V/S, labs  Height: 5\' 2"  (157.5 cm) Weight: 145 lb 11.6 oz (66.1 kg) IBW/kg (Calculated) : 50.1  Temp (24hrs), Avg:98.4 F (36.9 C), Min:98.1 F (36.7 C), Max:98.6 F (37 C)   Recent Labs Lab 07/21/17 1924 07/21/17 1936 07/21/17 2235 07/21/17 2317  WBC 8.6  --   --   --   CREATININE 0.69  --   --   --   LATICACIDVEN  --  1.4 1.56 1.1    Estimated Creatinine Clearance: 44.2 mL/min (by C-G formula based on SCr of 0.69 mg/dL).    Allergies  Allergen Reactions  . Latex Itching and Rash  . Adhesive [Tape] Other (See Comments)    Tears skin  . Betadine [Povidone Iodine]     blisters  . Ciprofloxacin Nausea And Vomiting  . Codeine Nausea And Vomiting    Upsets stomach, nightmares  . Penicillins Hives    Has patient had a PCN reaction causing immediate rash, facial/tongue/throat swelling, SOB or lightheadedness with hypotension: yes Has patient had a PCN reaction causing severe rash involving mucus membranes or skin necrosis: yes Has patient had a PCN reaction that required hospitalization: no Has patient had a PCN reaction occurring within the last 10 years: no If all of the above answers are "NO", then may proceed with Cephalosporin use.   . Sulfa Antibiotics Hives  . Wellbutrin [Bupropion]     Reaction unknown    Antimicrobials this admission: Aztreonam 10/8 >>   Microbiology results: 10/8 BCx: pending  10/8 UCx: pending 10/8 MRSA PCR: neg  Thank you for allowing pharmacy to be a part of this patient's care.  Isac Sarna, BS Pharm D, [place] Clinical Pharmacist Pager [phone] 07/22/2017 8:57 AM

## 2017-07-22 NOTE — Progress Notes (Signed)
SLP Cancellation Note  Patient Details Name: Sara Garcia MRN: [id number] DOB: [birth year]   Cancelled treatment:       Reason Eval/Treat Not Completed: Patient at procedure or test/unavailable; Pt obtaining procedure at this time. SLP will check back later for SLE. Pt passed the RN swallow screen.  Thank you,  Genene Churn, [place]    [place] 07/22/2017, 2:31 PM

## 2017-07-22 NOTE — Progress Notes (Signed)
Patient's heart rate sustaining in 120s-130s, MD notified.

## 2017-07-22 NOTE — Consult Note (Signed)
[place] A. Merlene Laughter, MD     [email or web address]          Sara Garcia is an 83 y.o. female.   ASSESSMENT/PLAN: 1. Acute embolic stroke involving the left frontal hemisphere despite the patient being on full-dose anticoagulation.  In fact, she is on a high dose of Eliquis for her age.  Given this I think we should switch her to a different agent.  I would recommend Xarelto 20 mg daily.  Speech therapy is also recommended.    2.    Paroxysmal atrial fibrillation:  The patient is 83 year old white female who has not been herself over the last week.  She reports many symptoms such as abdominal pain and is not feeling right.  She reports having some difficulties with speaking and this was one of the main reason why she presented to the emergency room.   She also presented with difficulties ambulating.  She complains of significant left ankle pain.  She has severe dysarthria and this limits the evaluation.  She tells me that she has been compliant with all medications including the Eliquis.  The review of systems otherwise negative.    GENERAL:  This is a very pleasant thin female who is in no acute distress.  HEENT:   This is normal.  ABDOMEN: soft  EXTREMITIES:   This significant pain involving the left ankle although it is only slightly swollen.  No clear erythema.  BACK:  This is normal.  SKIN: Normal by inspection.    MENTAL STATUS: Alert and oriented.   This includes orientation to her age and the month.  She generally lucid and coherent but had a severe dysarthria.  She seemed to have subtle word-finding difficulties although on bedside testing she named 5/5 objects.  CRANIAL NERVES: Pupils are equal, round and reactive to light and accomodation; extra ocular movements are full, there is no significant nystagmus; visual fields are full; upper and lower facial muscles are normal in strength and symmetric, there is no flattening of the nasolabial folds; tongue is  midline; uvula is midline; shoulder elevation is normal.  MOTOR: Normal tone, bulk and strength; no pronator drift.  COORDINATION: Left finger to nose is normal, right finger to nose is normal, No rest tremor; no intention tremor; no postural tremor; no bradykinesia.  REFLEXES: Deep tendon reflexes are symmetrical and normal,  except at the right ankle where it is markedly diminished. Babinski reflexes are flexor bilaterally.   SENSATION: Normal to light touch, temperature, and pinprick.  GAIT: Normal.     NIH stroke scale 3.   Blood pressure (!) 159/77, pulse (!) 126, temperature 98.3 F (36.8 C), resp. rate (!) 25, height 5' 2"  (1.575 m), weight 145 lb 11.6 oz (66.1 kg), SpO2 96 %.  Past Medical History:  Diagnosis Date  . A-fib ([place])   . Arthritis   . Asthma   . DVT (deep venous thrombosis) ([place]) 2011/2012   on Eliquis  . Esophageal dysphagia    limited food and pill  . Esophageal reflux   . Essential hypertension, benign   . GERD (gastroesophageal reflux disease)   . Osteoporosis   . Unspecified prolapse of vaginal walls     Past Surgical History:  Procedure Laterality Date  . ABDOMINAL HYSTERECTOMY    . COLONOSCOPY W/ BIOPSIES  02/01/2004   RMR: Anal papilla with normal rectum/Sigmoid diverticula/Small polyps in the cecum removed as described above. Inflammatory polyp  . ESOPHAGEAL DILATION     unknown  year per patient.  . ESOPHAGEAL DILATION N/A 02/07/2015   RMR: Normal esophagus status post maloney dilation. Small hiatal hernia.   Marland Kitchen ESOPHAGOGASTRODUODENOSCOPY     unknown year per patient  . ESOPHAGOGASTRODUODENOSCOPY N/A 02/07/2015   RMR: Normal esophagus  status post  Maloney dilation. Small hiatal hernia  . FEMUR IM NAIL Left 06/11/2017   Procedure: INTRAMEDULLARY (IM) NAIL FEMORAL;  Surgeon: Rod Can, MD;  Location: [facility];  Service: Orthopedics;  Laterality: Left;  . NASAL SEPTUM SURGERY    . TONSILLECTOMY    . WRIST FRACTURE SURGERY      Family  History  Problem Relation Age of Onset  . Colon polyps Neg Hx   . Colon cancer Neg Hx     Social History:  reports that she has never smoked. She has never used smokeless tobacco. She reports that she does not drink alcohol or use drugs.  Allergies:  Allergies  Allergen Reactions  . Latex Itching and Rash  . Adhesive [Tape] Other (See Comments)    Tears skin  . Betadine [Povidone Iodine]     blisters  . Ciprofloxacin Nausea And Vomiting  . Codeine Nausea And Vomiting    Upsets stomach, nightmares  . Penicillins Hives    Has patient had a PCN reaction causing immediate rash, facial/tongue/throat swelling, SOB or lightheadedness with hypotension: yes Has patient had a PCN reaction causing severe rash involving mucus membranes or skin necrosis: yes Has patient had a PCN reaction that required hospitalization: no Has patient had a PCN reaction occurring within the last 10 years: no If all of the above answers are "NO", then may proceed with Cephalosporin use.   . Sulfa Antibiotics Hives  . Wellbutrin [Bupropion]     Reaction unknown    Medications: Prior to Admission medications   Medication Sig Start Date End Date Taking? Authorizing Provider  acetaminophen (TYLENOL) 500 MG tablet Take 1,000 mg by mouth 2 (two) times daily as needed for mild pain, moderate pain or headache.    Yes [provider]  apixaban (ELIQUIS) 5 MG TABS tablet Take 1 tablet (5 mg total) by mouth 2 (two) times daily. 01/14/17  Yes BranchAlphonse Guild, MD  Calcium Carb-Cholecalciferol (CALCIUM-VITAMIN D) 500-200 MG-UNIT tablet Take 1 tablet by mouth daily.   Yes [provider]  cyanocobalamin 2000 MCG tablet Take 2,000 mcg by mouth daily.   Yes [provider]  diltiazem (CARDIZEM) 30 MG tablet Take 1 tablet (30 mg total) by mouth 2 (two) times daily. 02/24/17  Yes BranchAlphonse Guild, MD  DULoxetine (CYMBALTA) 60 MG capsule Take 60 mg by mouth 2 (two) times daily.    Yes [provider]  esomeprazole (NEXIUM) 20 MG capsule Take 1 capsule (20 mg total) by mouth daily at 12 noon. 11/06/16  Yes Carlis Stable, NP  Fluticasone-Salmeterol (ADVAIR) 250-50 MCG/DOSE AEPB Inhale 1 puff into the lungs daily.    Yes [provider]  metoprolol tartrate (LOPRESSOR) 25 MG tablet Take 1.5 tablets (37.5 mg total) by mouth 2 (two) times daily. 02/24/17  Yes Branch, Alphonse Guild, MD  polyethylene glycol powder (GLYCOLAX/MIRALAX) powder MIX 17 GRAMS WITH LIQUID AND TAKE BY MOUTH ONCE DAILY Patient taking differently: MIX 17 GRAMS WITH LIQUID AND TAKE BY MOUTH DAILY AS NEEDED FOR CONSTIPATION. 12/13/16  Yes Mahala Menghini, PA-C  pyridOXINE (VITAMIN B-6) 100 MG tablet Take 100 mg by mouth daily.   Yes [provider]    Scheduled Meds: . apixaban  5 mg Oral BID  . aspirin  300 mg Rectal Daily   Or  . aspirin  325 mg Oral Daily  . diltiazem  30 mg Oral Q12H  . DULoxetine  60 mg Oral BID  . mometasone-formoterol  2 puff Inhalation BID  . nystatin  5 mL Oral QID  . pantoprazole  40 mg Oral Daily   Continuous Infusions: . aztreonam Stopped (07/22/17 1730)   PRN Meds:.acetaminophen **OR** acetaminophen (TYLENOL) oral liquid 160 mg/5 mL **OR** acetaminophen, ondansetron (ZOFRAN) IV, polyethylene glycol, senna-docusate     Results for orders placed or performed during the hospital encounter of 07/21/17 (from the past 48 hour(s))  Urine rapid drug screen (hosp performed)     Status: None   Collection Time: 07/21/17  6:12 PM  Result Value Ref Range   Opiates NONE DETECTED NONE DETECTED   Cocaine NONE DETECTED NONE DETECTED   Benzodiazepines NONE DETECTED NONE DETECTED   Amphetamines NONE DETECTED NONE DETECTED   Tetrahydrocannabinol NONE DETECTED NONE DETECTED   Barbiturates NONE DETECTED NONE DETECTED    Comment:        DRUG SCREEN FOR MEDICAL PURPOSES ONLY.  IF CONFIRMATION IS NEEDED FOR ANY PURPOSE, NOTIFY LAB WITHIN 5 DAYS.        LOWEST DETECTABLE  LIMITS FOR URINE DRUG SCREEN Drug Class       Cutoff (ng/mL) Amphetamine      1000 Barbiturate      200 Benzodiazepine   803 Tricyclics       212 Opiates          300 Cocaine          300 THC              50   Urinalysis, Routine w reflex microscopic     Status: Abnormal   Collection Time: 07/21/17  6:12 PM  Result Value Ref Range   Color, Urine YELLOW YELLOW   APPearance CLEAR CLEAR   Specific Gravity, Urine 1.021 1.005 - 1.030   pH 5.0 5.0 - 8.0   Glucose, UA NEGATIVE NEGATIVE mg/dL   Hgb urine dipstick NEGATIVE NEGATIVE   Bilirubin Urine NEGATIVE NEGATIVE   Ketones, ur 5 (A) NEGATIVE mg/dL   Protein, ur NEGATIVE NEGATIVE mg/dL   Nitrite NEGATIVE NEGATIVE   Leukocytes, UA TRACE (A) NEGATIVE   RBC / HPF 0-5 0 - 5 RBC/hpf   WBC, UA TOO NUMEROUS TO COUNT 0 - 5 WBC/hpf   Bacteria, UA NONE SEEN NONE SEEN   Squamous Epithelial / LPF NONE SEEN NONE SEEN   Mucus PRESENT   Ethanol     Status: None   Collection Time: 07/21/17  7:24 PM  Result Value Ref Range   Alcohol, Ethyl (B) <10 <10 mg/dL    Comment:        LOWEST DETECTABLE LIMIT FOR SERUM ALCOHOL IS 10 mg/dL FOR MEDICAL PURPOSES ONLY Please note change in reference range.   Protime-INR     Status: None   Collection Time: 07/21/17  7:24 PM  Result Value Ref Range   Prothrombin Time 15.2 11.4 - 15.2 seconds   INR 1.21   APTT     Status: None   Collection Time: 07/21/17  7:24 PM  Result Value Ref Range   aPTT 34 24 - 36 seconds  CBC     Status: Abnormal   Collection Time: 07/21/17  7:24 PM  Result Value Ref Range   WBC 8.6 4.0 - 10.5 K/uL   RBC  3.94 3.87 - 5.11 MIL/uL   Hemoglobin 11.4 (L) 12.0 - 15.0 g/dL   HCT 35.4 (L) 36.0 - 46.0 %   MCV 89.8 78.0 - 100.0 fL   MCH 28.9 26.0 - 34.0 pg   MCHC 32.2 30.0 - 36.0 g/dL   RDW 15.2 11.5 - 15.5 %   Platelets 274 150 - 400 K/uL  Differential     Status: None   Collection Time: 07/21/17  7:24 PM  Result Value Ref Range   Neutrophils Relative % 45 %   Neutro Abs 3.9  1.7 - 7.7 K/uL   Lymphocytes Relative 44 %   Lymphs Abs 3.7 0.7 - 4.0 K/uL   Monocytes Relative 9 %   Monocytes Absolute 0.8 0.1 - 1.0 K/uL   Eosinophils Relative 2 %   Eosinophils Absolute 0.2 0.0 - 0.7 K/uL   Basophils Relative 0 %   Basophils Absolute 0.0 0.0 - 0.1 K/uL  Comprehensive metabolic panel     Status: Abnormal   Collection Time: 07/21/17  7:24 PM  Result Value Ref Range   Sodium 142 135 - 145 mmol/L   Potassium 3.0 (L) 3.5 - 5.1 mmol/L   Chloride 105 101 - 111 mmol/L   CO2 26 22 - 32 mmol/L   Glucose, Bld 96 65 - 99 mg/dL   BUN 26 (H) 6 - 20 mg/dL   Creatinine, Ser 0.69 0.44 - 1.00 mg/dL   Calcium 8.6 (L) 8.9 - 10.3 mg/dL   Total Protein 7.0 6.5 - 8.1 g/dL   Albumin 3.5 3.5 - 5.0 g/dL   AST 23 15 - 41 U/L   ALT 17 14 - 54 U/L   Alkaline Phosphatase 126 38 - 126 U/L   Total Bilirubin 0.8 0.3 - 1.2 mg/dL   GFR calc non Af Amer >60 >60 mL/min   GFR calc Af Amer >60 >60 mL/min    Comment: (NOTE) The eGFR has been calculated using the CKD EPI equation. This calculation has not been validated in all clinical situations. eGFR's persistently <60 mL/min signify possible Chronic Kidney Disease.    Anion gap 11 5 - 15  Troponin I     Status: None   Collection Time: 07/21/17  7:24 PM  Result Value Ref Range   Troponin I <0.03 <0.03 ng/mL  TSH     Status: None   Collection Time: 07/21/17  7:24 PM  Result Value Ref Range   TSH 1.163 0.350 - 4.500 uIU/mL    Comment: Performed by a 3rd Generation assay with a functional sensitivity of <=0.01 uIU/mL.  Lactic acid, plasma     Status: None   Collection Time: 07/21/17  7:36 PM  Result Value Ref Range   Lactic Acid, Venous 1.4 0.5 - 1.9 mmol/L  Procalcitonin     Status: None   Collection Time: 07/21/17  7:36 PM  Result Value Ref Range   Procalcitonin <0.10 ng/mL    Comment:        Interpretation: PCT (Procalcitonin) <= 0.5 ng/mL: Systemic infection (sepsis) is not likely. Local bacterial infection is  possible. (NOTE)         ICU PCT Algorithm               Non ICU PCT Algorithm    ----------------------------     ------------------------------         PCT < 0.25 ng/mL                 PCT < 0.1 ng/mL  Stopping of antibiotics            Stopping of antibiotics       strongly encouraged.               strongly encouraged.    ----------------------------     ------------------------------       PCT level decrease by               PCT < 0.25 ng/mL       >= 80% from peak PCT       OR PCT 0.25 - 0.5 ng/mL          Stopping of antibiotics                                             encouraged.     Stopping of antibiotics           encouraged.    ----------------------------     ------------------------------       PCT level decrease by              PCT >= 0.25 ng/mL       < 80% from peak PCT        AND PCT >= 0.5 ng/mL            Continuin g antibiotics                                              encouraged.       Continuing antibiotics            encouraged.    ----------------------------     ------------------------------     PCT level increase compared          PCT > 0.5 ng/mL         with peak PCT AND          PCT >= 0.5 ng/mL             Escalation of antibiotics                                          strongly encouraged.      Escalation of antibiotics        strongly encouraged.   I-Stat CG4 Lactic Acid, ED     Status: None   Collection Time: 07/21/17 10:35 PM  Result Value Ref Range   Lactic Acid, Venous 1.56 0.5 - 1.9 mmol/L  MRSA PCR Screening     Status: None   Collection Time: 07/21/17 11:01 PM  Result Value Ref Range   MRSA by PCR NEGATIVE NEGATIVE    Comment:        The GeneXpert MRSA Assay (FDA approved for NASAL specimens only), is one component of a comprehensive MRSA colonization surveillance program. It is not intended to diagnose MRSA infection nor to guide or monitor treatment for MRSA infections.   Culture, blood (x 2)     Status: None  (Preliminary result)   Collection Time: 07/21/17 11:10 PM  Result Value Ref Range   Specimen Description BLOOD RIGHT ARM    Special Requests  BOTTLES DRAWN AEROBIC AND ANAEROBIC Blood Culture adequate volume   Culture NO GROWTH < 12 HOURS    Report Status PENDING   Culture, blood (x 2)     Status: None (Preliminary result)   Collection Time: 07/21/17 11:17 PM  Result Value Ref Range   Specimen Description BLOOD RIGHT ARM    Special Requests      BOTTLES DRAWN AEROBIC AND ANAEROBIC Blood Culture adequate volume   Culture NO GROWTH < 12 HOURS    Report Status PENDING   Lactic acid, plasma     Status: None   Collection Time: 07/21/17 11:17 PM  Result Value Ref Range   Lactic Acid, Venous 1.1 0.5 - 1.9 mmol/L  Magnesium     Status: None   Collection Time: 07/21/17 11:17 PM  Result Value Ref Range   Magnesium 2.0 1.7 - 2.4 mg/dL  Lipid panel     Status: None   Collection Time: 07/22/17  3:58 AM  Result Value Ref Range   Cholesterol 132 0 - 200 mg/dL   Triglycerides 88 <150 mg/dL   HDL 50 >40 mg/dL   Total CHOL/HDL Ratio 2.6 RATIO   VLDL 18 0 - 40 mg/dL   LDL Cholesterol 64 0 - 99 mg/dL    Comment:        Total Cholesterol/HDL:CHD Risk Coronary Heart Disease Risk Table                     Men   Women  1/2 Average Risk   3.4   3.3  Average Risk       5.0   4.4  2 X Average Risk   9.6   7.1  3 X Average Risk  23.4   11.0        Use the calculated Patient Ratio above and the CHD Risk Table to determine the patient's CHD Risk.        ATP III CLASSIFICATION (LDL):  <100     mg/dL   Optimal  100-129  mg/dL   Near or Above                    Optimal  130-159  mg/dL   Borderline  160-189  mg/dL   High  >190     mg/dL   Very High   CBC     Status: Abnormal   Collection Time: 07/22/17  9:02 AM  Result Value Ref Range   WBC 8.7 4.0 - 10.5 K/uL   RBC 3.80 (L) 3.87 - 5.11 MIL/uL   Hemoglobin 10.8 (L) 12.0 - 15.0 g/dL   HCT 34.6 (L) 36.0 - 46.0 %   MCV 91.1 78.0 - 100.0  fL   MCH 28.4 26.0 - 34.0 pg   MCHC 31.2 30.0 - 36.0 g/dL   RDW 15.4 11.5 - 15.5 %   Platelets 272 150 - 400 K/uL  Basic metabolic panel     Status: Abnormal   Collection Time: 07/22/17  9:02 AM  Result Value Ref Range   Sodium 138 135 - 145 mmol/L   Potassium 3.5 3.5 - 5.1 mmol/L   Chloride 106 101 - 111 mmol/L   CO2 22 22 - 32 mmol/L   Glucose, Bld 149 (H) 65 - 99 mg/dL   BUN 18 6 - 20 mg/dL   Creatinine, Ser 0.63 0.44 - 1.00 mg/dL   Calcium 8.0 (L) 8.9 - 10.3 mg/dL   GFR calc non Af Amer >60 >60  mL/min   GFR calc Af Amer >60 >60 mL/min    Comment: (NOTE) The eGFR has been calculated using the CKD EPI equation. This calculation has not been validated in all clinical situations. eGFR's persistently <60 mL/min signify possible Chronic Kidney Disease.    Anion gap 10 5 - 15    Studies/Results:  CAROTID DOPPLER GOOD   BRAIN MRI MRA FINDINGS: MRI HEAD FINDINGS  Brain: Background pattern of advanced chronic small-vessel ischemic changes affecting the pons, thalami, basal ganglia and hemispheric deep and subcortical white matter. Acute infarction affecting the subcortical white matter in the left posterior frontal region, measuring about 8 mm by 1.8 cm. No swelling or hemorrhage. No other acute finding. No mass lesion, hemorrhage, hydrocephalus or extra-axial collection.  Vascular: Major vessels at the base of the brain show flow.  Skull and upper cervical spine: Negative  Sinuses/Orbits: Clear/normal  Other: None  MRA HEAD FINDINGS  Some motion degradation. Both internal carotid arteries are widely patent through the skullbase and siphon regions. The anterior and middle cerebral vessels are patent without proximal stenosis, aneurysm or vascular malformation. More distal branch vessels show some atherosclerotic irregularity. Both vertebral arteries are widely patent to the basilar. No basilar stenosis. Posterior circulation branch vessels are patent.  Distal branches show some atherosclerotic irregularity.  IMPRESSION: Background pattern of advanced chronic small-vessel ischemic changes throughout the brain. Acute white matter infarction in the left posterior frontal region measuring about 8 x 18 mm. No hemorrhage or swelling.  No large or medium vessel abnormality on the MR angiogram. Distal vessel atherosclerotic changes.    The brain MRI and MRA are reviewed in person. There is a juxtacortical margin size vessel involving the posterior left frontal lobe. This is consistent with a cortical base embolic appearing infarct. There is evidence of encephalomalacia with a reduced signal involving left frontal lobe. This is a subcortical margin size lesion adjacent to the frontal horn of the lateral ventricle. Additional findings seen involving the posterior horn of the lateral ventricle on the right side. There is moderate global atrophy and moderate confluent leukoencephalopathy. No hemorrhages appreciated. MRA does not show significant intracranial occlusive disease.        TTE  - Left ventricle: The cavity size was normal. There was mild focal   basal hypertrophy of the septum. Systolic function was normal.   The estimated ejection fraction was in the range of 55% to 60%.   Doppler parameters are consistent with both elevated ventricular   end-diastolic filling pressure and elevated left atrial filling   pressure. - Mitral valve: Calcified annulus. Mildly thickened leaflets . - Right ventricle: The cavity size was mildly dilated. - Atrial septum: No defect or patent foramen ovale was identified    Brisa Auth A. Merlene Laughter, M.D.  Diplomate, Tax adviser of Psychiatry and Neurology ( Neurology). 07/22/2017, 7:09 PM

## 2017-07-22 NOTE — Progress Notes (Signed)
0917 Left with transport for MRI brain and LLE venous US.

## 2017-07-23 ENCOUNTER — Encounter (HOSPITAL_COMMUNITY): Payer: Self-pay | Admitting: Family Medicine

## 2017-07-23 DIAGNOSIS — I639 Cerebral infarction, unspecified: Secondary | ICD-10-CM | POA: Diagnosis present

## 2017-07-23 MED ORDER — METOPROLOL TARTRATE 25 MG PO TABS
37.5000 mg | ORAL_TABLET | Freq: Two times a day (BID) | ORAL | Status: DC
  Administered 2017-07-23 – 2017-07-24 (×3): 37.5 mg via ORAL
  Filled 2017-07-23 (×3): qty 2

## 2017-07-23 MED ORDER — RIVAROXABAN 15 MG PO TABS
15.0000 mg | ORAL_TABLET | Freq: Every day | ORAL | Status: DC
  Administered 2017-07-23 – 2017-07-24 (×2): 15 mg via ORAL
  Filled 2017-07-23 (×2): qty 1

## 2017-07-23 MED ORDER — HYDROMORPHONE HCL 1 MG/ML IJ SOLN
0.5000 mg | Freq: Once | INTRAMUSCULAR | Status: AC
  Administered 2017-07-23: 0.5 mg via INTRAVENOUS
  Filled 2017-07-23: qty 1

## 2017-07-23 NOTE — Plan of Care (Signed)
Problem: Education: Goal: Knowledge of patient specific risk factors addressed and post discharge goals established will improve Outcome: Progressing Discussed risk factors associated with a fib and importance of compliance with anticoagulant medications

## 2017-07-23 NOTE — Care Management Note (Addendum)
Case Management Note  Patient Details  Name: Sara Garcia MRN: [id number] Date of Birth: 1928-10-05  Subjective/Objective:                  Adm with CVA. From home with family. Has RW and BSC at home pta. She is currently active with [facility] home health. Santiago Glad of [facility] is aware of admission.   Action/Plan: Recommended to continue home health services with addition of Speech therapy.    Expected Discharge Date:       07/24/2017           Expected Discharge Plan:  [place]  In-House Referral:     Discharge planning Services  CM Consult  Post Acute Care Choice:  Home Health, Resumption of Svcs/PTA Provider Choice offered to:  Patient  DME Arranged:    DME Agency:     HH Arranged:  PT, OT, RN, Speech Therapy HH Agency:  [facility]  Status of Service:  Completed, signed off  If discussed at [place] of Stay Meetings, dates discussed:    Additional Comments:  Myrna Vonseggern, Chauncey Reading, RN 07/23/2017, 3:12 PM

## 2017-07-23 NOTE — Progress Notes (Signed)
Physical Therapy Treatment Patient Details Name: Sara Garcia MRN: [id number] DOB: 18-Apr-1928 Today's Date: 07/23/2017    History of Present Illness [name] is a 83 y.o. female with medical history significant of recent left hip fracture s/p repair; HTN; GERD; DVT; and afib on Eliquis presenting with dysarthria.  She hasn't been feeling well for 5-6 days - nausea, diarrhea and all.  Yesterday, she thought she had a UTI - her personality was different and she didn't feel well.    This AM, she was complaining of abdominal pain.  Later this afternoon, her speech was very slurred.  +dysuria day before yesterday.  Wasn't peeing as much.  Complaining about her back hurting very much, different from her usual but still midline.  No known fevers.  She is having epigastric pain, RUQ vs. LUQ - hard to tell.  Has h/o hiatal hernia.  Her therapist was there this AM and she developed aphasia/slurring of speech at that time.  No dysphagia.  No N/W/T of arms or legs.  She has noticed a bit of edema and tightness along her left calf. MRI positive for left CVA    PT Comments    Pt supine in bed upon therapist arrival and willing to participate. Does report pain in Lt LE, RN informed though does report not time for medication.  Session focus on LE strengthening with good form and technique complete with all exercises.  Pt left in bed per RN request stated pt preparing for transfer to another room.  No reports of increased pain through session.  Pt left supine with call bell within reach.     Follow Up Recommendations  Home health PT;Supervision/Assistance - 24 hour     Equipment Recommendations  None recommended by PT    Recommendations for Other Services       Precautions / Restrictions Precautions Precautions: Fall Precaution Comments: recent L hip surgery and NWB status Restrictions Weight Bearing Restrictions: Yes LLE Weight Bearing: Non weight bearing    Mobility  Bed Mobility Overal bed  mobility: Needs Assistance       Supine to sit: Min guard Sit to supine: Min guard   General bed mobility comments: RN requested to leave pt in bed prior transfer to different room, bed mobility and transfers not complete this session  Transfers Overall transfer level: Needs assistance Equipment used: Rolling walker (2 wheeled) Transfers: Sit to/from [facility] Sit to Stand: Min guard Stand pivot transfers: Min assist          Ambulation/Gait Ambulation/Gait assistance: Min assist Ambulation Distance (Feet): 5 Feet Assistive device: Rolling walker (2 wheeled) Gait Pattern/deviations: Decreased step length - right;Decreased stance time - right;Decreased stride length   Gait velocity interpretation: Below normal speed for age/gender General Gait Details: Patient demonstrates fair/good return for maintaining NWB LLE taking a few steps, limited secondary to fatigue   Stairs            Wheelchair Mobility    Modified Rankin (Stroke Patients Only)       Balance Overall balance assessment: Needs assistance Sitting-balance support: No upper extremity supported;Feet supported Sitting balance-Leahy Scale: Good     Standing balance support: Bilateral upper extremity supported;During functional activity Standing balance-Leahy Scale: Fair                              Cognition Arousal/Alertness: Awake/alert Behavior During Therapy: WFL for tasks assessed/performed Overall Cognitive Status: Within  Functional Limits for tasks assessed                                        Exercises Total Joint Exercises Ankle Circles/Pumps: Supine;10 reps;Both;Strengthening;AROM Quad Sets: Supine;10 reps;Both;Strengthening;AROM Gluteal Sets: Supine;10 reps;Both;Strengthening;AROM Short Arc Quad: Supine;10 reps;Both;Strengthening;AROM Heel Slides: Supine;10 reps;Both;Strengthening;AROM Hip ABduction/ADduction: AAROM;10 reps Long Arc  Quad: Seated;AROM;Strengthening;Both;10 reps Knee Flexion: Seated;AROM;Strengthening;Both;10 reps    General Comments        Pertinent Vitals/Pain Pain Score: 7  Pain Location: inner thigh Lt LE  Pain Descriptors / Indicators: Dull;Aching;Sore Pain Intervention(s): Monitored during session;Patient requesting pain meds-RN notified;Repositioned    Home Living                      Prior Function            PT Goals (current goals can now be found in the care plan section) Acute Rehab PT Goals Patient Stated Goal: Return home with family to assist PT Goal Formulation: With patient/family Time For Goal Achievement: 07/29/17 Potential to Achieve Goals: Good Progress towards PT goals: Progressing toward goals    Frequency    7X/week      PT Plan      Co-evaluation              AM-PAC PT "6 Clicks" Daily Activity  Outcome Measure  Difficulty turning over in bed (including adjusting bedclothes, sheets and blankets)?: A Little Difficulty moving from lying on back to sitting on the side of the bed? : A Little Difficulty sitting down on and standing up from a chair with arms (e.g., wheelchair, bedside commode, etc,.)?: A Little Help needed moving to and from a bed to chair (including a wheelchair)?: A Little Help needed walking in hospital room?: A Lot Help needed climbing 3-5 steps with a railing? : Total 6 Click Score: 15    End of Session Equipment Utilized During Treatment: Gait belt Activity Tolerance: Patient tolerated treatment well;No increased pain;Patient limited by fatigue Patient left: in bed;with call bell/phone within reach (per RN request) Nurse Communication: Mobility status PT Visit Diagnosis: Unsteadiness on feet (R26.81);Other abnormalities of gait and mobility (R26.89);Muscle weakness (generalized) (M62.81)     Time: 0459-9774 PT Time Calculation (min) (ACUTE ONLY): 17 min  Charges:  $Therapeutic Exercise: 8-22 mins $Therapeutic  Activity: 8-22 mins                    G Codes:  Mobility: Walking and Moving Around Current Status (F4239): At least 40 percent but less than 60 percent impaired, limited or restricted Mobility: Walking and Moving Around Goal Status [phone]): At least 40 percent but less than 60 percent impaired, limited or restricted Mobility: Walking and Moving Around Discharge Status [phone]): At least 40 percent but less than 60 percent impaired, limited or restricted    Ihor Austin, [place]; [place]   Aldona Lento 07/23/2017, 3:37 PM

## 2017-07-23 NOTE — Progress Notes (Signed)
Physical Therapy Treatment Patient Details Name: Sara Garcia MRN: [id number] DOB: Nov 24, 1927 Today's Date: 07/23/2017    History of Present Illness Sara Garcia is a 83 y.o. female with medical history significant of recent left hip fracture s/p repair; HTN; GERD; DVT; and afib on Eliquis presenting with dysarthria.  She hasn't been feeling well for 5-6 days - nausea, diarrhea and all.  Yesterday, she thought she had a UTI - her personality was different and she didn't feel well.    This AM, she was complaining of abdominal pain.  Later this afternoon, her speech was very slurred.  +dysuria day before yesterday.  Wasn't peeing as much.  Complaining about her back hurting very much, different from her usual but still midline.  No known fevers.  She is having epigastric pain, RUQ vs. LUQ - hard to tell.  Has h/o hiatal hernia.  Her therapist was there this AM and she developed aphasia/slurring of speech at that time.  No dysphagia.  No N/W/T of arms or legs.  She has noticed a bit of edema and tightness along her left calf. MRI positive for left CVA    PT Comments    Patient instructed in post op hip exercises with good return demonstrated after verbal cues - written instructions given, limited to a few steps to transfer to chair and tolerated sitting up with family supervising after therapy.  Patient will benefit from continued physical therapy in hospital and recommended venue below to increase strength, balance, endurance for safe ADLs and gait.    Follow Up Recommendations  Home health PT;Supervision/Assistance - 24 hour     Equipment Recommendations  None recommended by PT    Recommendations for Other Services       Precautions / Restrictions Precautions Precautions: Fall Precaution Comments: recent L hip surgery and NWB status Restrictions Weight Bearing Restrictions: Yes LLE Weight Bearing: Non weight bearing    Mobility  Bed Mobility Overal bed mobility: Needs Assistance        Supine to sit: Min guard Sit to supine: Min guard   General bed mobility comments: required instructions to scoot side to side using BUE with fair/good return demonstrated  Transfers Overall transfer level: Needs assistance Equipment used: Rolling walker (2 wheeled) Transfers: Sit to/from [facility] Sit to Stand: Min guard Stand pivot transfers: Min assist          Ambulation/Gait Ambulation/Gait assistance: Min assist Ambulation Distance (Feet): 5 Feet Assistive device: Rolling walker (2 wheeled) Gait Pattern/deviations: Decreased step length - right;Decreased stance time - right;Decreased stride length   Gait velocity interpretation: Below normal speed for age/gender General Gait Details: Patient demonstrates fair/good return for maintaining NWB LLE taking a few steps, limited secondary to fatigue   Stairs            Wheelchair Mobility    Modified Rankin (Stroke Patients Only)       Balance Overall balance assessment: Needs assistance Sitting-balance support: No upper extremity supported;Feet supported Sitting balance-Leahy Scale: Good     Standing balance support: Bilateral upper extremity supported;During functional activity Standing balance-Leahy Scale: Fair                              Cognition Arousal/Alertness: Awake/alert Behavior During Therapy: WFL for tasks assessed/performed Overall Cognitive Status: Within Functional Limits for tasks assessed  Exercises Total Joint Exercises Ankle Circles/Pumps: Supine;10 reps;Both;Strengthening;AROM Quad Sets: Supine;10 reps;Both;Strengthening;AROM Gluteal Sets: Supine;10 reps;Both;Strengthening;AROM Short Arc Quad: Supine;10 reps;Both;Strengthening;AROM Heel Slides: Supine;10 reps;Both;Strengthening;AROM Long Arc Quad: Seated;AROM;Strengthening;Both;10 reps Knee Flexion: Seated;AROM;Strengthening;Both;10 reps     General Comments        Pertinent Vitals/Pain Pain Score: 8  Pain Location: low back Pain Descriptors / Indicators: Aching Pain Intervention(s): Limited activity within patient's tolerance;Monitored during session    Home Living                      Prior Function            PT Goals (current goals can now be found in the care plan section) Acute Rehab PT Goals Patient Stated Goal: Return home with family to assist PT Goal Formulation: With patient/family Time For Goal Achievement: 07/29/17 Potential to Achieve Goals: Good Progress towards PT goals: Progressing toward goals    Frequency    7X/week      PT Plan      Co-evaluation              AM-PAC PT "6 Clicks" Daily Activity  Outcome Measure  Difficulty turning over in bed (including adjusting bedclothes, sheets and blankets)?: A Little Difficulty moving from lying on back to sitting on the side of the bed? : A Little Difficulty sitting down on and standing up from a chair with arms (e.g., wheelchair, bedside commode, etc,.)?: A Little Help needed moving to and from a bed to chair (including a wheelchair)?: A Little Help needed walking in hospital room?: A Lot Help needed climbing 3-5 steps with a railing? : Total 6 Click Score: 15    End of Session Equipment Utilized During Treatment: Gait belt Activity Tolerance: Patient tolerated treatment well;Patient limited by fatigue Patient left: in chair;with call bell/phone within reach;with family/visitor present Nurse Communication: Mobility status PT Visit Diagnosis: Unsteadiness on feet (R26.81);Other abnormalities of gait and mobility (R26.89);Muscle weakness (generalized) (M62.81)     Time: 4332-9518 PT Time Calculation (min) (ACUTE ONLY): 35 min  Charges:  $Therapeutic Exercise: 8-22 mins $Therapeutic Activity: 8-22 mins                    G Codes:  Mobility: Walking and Moving Around Current Status (A4166): At least 40 percent but less  than 60 percent impaired, limited or restricted Mobility: Walking and Moving Around Goal Status [phone]): At least 40 percent but less than 60 percent impaired, limited or restricted Mobility: Walking and Moving Around Discharge Status [phone]): At least 40 percent but less than 60 percent impaired, limited or restricted    1:48 PM, 07/23/17 Lonell Grandchild, MPT Physical Therapist with [facility] [phone] office [phone] mobile phone

## 2017-07-23 NOTE — Progress Notes (Signed)
ANTICOAGULATION CONSULT NOTE - Initial Consult  Pharmacy Consult for Xarelto Indication: atrial fibrillation  Allergies  Allergen Reactions  . Latex Itching and Rash  . Adhesive [Tape] Other (See Comments)    Tears skin  . Betadine [Povidone Iodine]     blisters  . Ciprofloxacin Nausea And Vomiting  . Codeine Nausea And Vomiting    Upsets stomach, nightmares  . Penicillins Hives    Has patient had a PCN reaction causing immediate rash, facial/tongue/throat swelling, SOB or lightheadedness with hypotension: yes Has patient had a PCN reaction causing severe rash involving mucus membranes or skin necrosis: yes Has patient had a PCN reaction that required hospitalization: no Has patient had a PCN reaction occurring within the last 10 years: no If all of the above answers are "NO", then may proceed with Cephalosporin use.   . Sulfa Antibiotics Hives  . Wellbutrin [Bupropion]     Reaction unknown   Patient Measurements: Height: 5\' 2"  (157.5 cm) Weight: 143 lb 15.4 oz (65.3 kg) IBW/kg (Calculated) : 50.1 HEPARIN DW (KG): 63.7   Vital Signs: Temp: 97.8 F (36.6 C) (10/10 0600) Temp Source: Oral (10/10 0600) BP: 155/78 (10/10 0400) Pulse Rate: 93 (10/10 0400)  Labs:  Recent Labs  07/21/17 1924 07/22/17 0902  HGB 11.4* 10.8*  HCT 35.4* 34.6*  PLT 274 272  APTT 34  --   LABPROT 15.2  --   INR 1.21  --   CREATININE 0.69 0.63  TROPONINI <0.03  --    Estimated Creatinine Clearance: 44 mL/min (by C-G formula based on SCr of 0.63 mg/dL).  Medical History: Past Medical History:  Diagnosis Date  . A-fib ([place])   . Arthritis   . Asthma   . DVT (deep venous thrombosis) ([place]) 2011/2012   on Eliquis  . Esophageal dysphagia    limited food and pill  . Esophageal reflux   . Essential hypertension, benign   . GERD (gastroesophageal reflux disease)   . Osteoporosis   . Unspecified prolapse of vaginal walls    Medications:  Prescriptions Prior to Admission  Medication  Sig Dispense Refill Last Dose  . acetaminophen (TYLENOL) 500 MG tablet Take 1,000 mg by mouth 2 (two) times daily as needed for mild pain, moderate pain or headache.    07/21/2017 at Unknown time  . apixaban (ELIQUIS) 5 MG TABS tablet Take 1 tablet (5 mg total) by mouth 2 (two) times daily. 42 tablet 0 07/21/2017 at [place]  . Calcium Carb-Cholecalciferol (CALCIUM-VITAMIN D) 500-200 MG-UNIT tablet Take 1 tablet by mouth daily.   07/21/2017 at Unknown time  . cyanocobalamin 2000 MCG tablet Take 2,000 mcg by mouth daily.   07/21/2017 at Unknown time  . diltiazem (CARDIZEM) 30 MG tablet Take 1 tablet (30 mg total) by mouth 2 (two) times daily. 180 tablet 1 07/21/2017 at Unknown time  . DULoxetine (CYMBALTA) 60 MG capsule Take 60 mg by mouth 2 (two) times daily.    07/21/2017 at Unknown time  . esomeprazole (NEXIUM) 20 MG capsule Take 1 capsule (20 mg total) by mouth daily at 12 noon. 30 capsule 5 07/21/2017 at Unknown time  . Fluticasone-Salmeterol (ADVAIR) 250-50 MCG/DOSE AEPB Inhale 1 puff into the lungs daily.    07/21/2017 at Unknown time  . metoprolol tartrate (LOPRESSOR) 25 MG tablet Take 1.5 tablets (37.5 mg total) by mouth 2 (two) times daily. 270 tablet 1 07/21/2017 at [place]  . polyethylene glycol powder (GLYCOLAX/MIRALAX) powder MIX 17 GRAMS WITH LIQUID AND TAKE BY MOUTH ONCE  DAILY (Patient taking differently: MIX 17 GRAMS WITH LIQUID AND TAKE BY MOUTH DAILY AS NEEDED FOR CONSTIPATION.) 527 g 5 07/20/2017 at Unknown time  . pyridOXINE (VITAMIN B-6) 100 MG tablet Take 100 mg by mouth daily.   07/21/2017 at Unknown time   Assessment: Okay for Protocol, failed Apixaban per neurology.  CBC stable.  Last dose last PM @ 21:42.  Xarelto will be adjust for afib dose and CrCl 15-33ml/min.  Goal of Therapy:  Systemic Anticoagulation.   Plan:  Xarelto 15mg  PO daily with evening meal. Monitor for signs and symptoms of bleeding.  Educate  Biagio Quint R 07/23/2017,7:40 AM

## 2017-07-23 NOTE — Discharge Instructions (Addendum)
Information on my medicine - XARELTO (Rivaroxaban)  This medication education was reviewed with me or my healthcare representative as part of my discharge preparation.  The pharmacist that spoke with me during my hospital stay was:  Pricilla Larsson, [facility]  Why was Xarelto prescribed for you? Xarelto was prescribed for you to reduce the risk of a blood clot forming that can cause a stroke if you have a medical condition called atrial fibrillation (a type of irregular heartbeat).  What do you need to know about xarelto ? Take your Xarelto ONCE DAILY at the same time every day with your evening meal. If you have difficulty swallowing the tablet whole, you may crush it and mix in applesauce just prior to taking your dose.  Take Xarelto exactly as prescribed by your doctor and DO NOT stop taking Xarelto without talking to the doctor who prescribed the medication.  Stopping without other stroke prevention medication to take the place of Xarelto may increase your risk of developing a clot that causes a stroke.  Refill your prescription before you run out.  After discharge, you should have regular check-up appointments with your healthcare provider that is prescribing your Xarelto.  In the future your dose may need to be changed if your kidney function or weight changes by a significant amount.  What do you do if you miss a dose? If you are taking Xarelto ONCE DAILY and you miss a dose, take it as soon as you remember on the same day then continue your regularly scheduled once daily regimen the next day. Do not take two doses of Xarelto at the same time or on the same day.   Important Safety Information A possible side effect of Xarelto is bleeding. You should call your healthcare provider right away if you experience any of the following: ? Bleeding from an injury or your nose that does not stop. ? Unusual colored urine (red or dark brown) or unusual colored stools (red or black). ? Unusual  bruising for unknown reasons. ? A serious fall or if you hit your head (even if there is no bleeding).  Some medicines may interact with Xarelto and might increase your risk of bleeding while on Xarelto. To help avoid this, consult your healthcare provider or pharmacist prior to using any new prescription or non-prescription medications, including herbals, vitamins, non-steroidal anti-inflammatory drugs (NSAIDs) and supplements.  This website has more information on Xarelto: [email or web address]  Follow with Primary MD  Doree Albee, MD  and other consultant's as instructed your Hospitalist MD  Please get a complete blood count and chemistry panel checked by your Primary MD at your next visit, and again as instructed by your Primary MD.  Get Medicines reviewed and adjusted: Please take all your medications with you for your next visit with your Primary MD  Laboratory/radiological data: Please request your Primary MD to go over all hospital tests and procedure/radiological results at the follow up, please ask your Primary MD to get all Hospital records sent to his/her office.  In some cases, they will be blood work, cultures and biopsy results pending at the time of your discharge. Please request that your primary care M.D. follows up on these results.  Also Note the following: If you experience worsening of your admission symptoms, develop shortness of breath, life threatening emergency, suicidal or homicidal thoughts you must seek medical attention immediately by calling 911 or calling your MD immediately  if symptoms less severe.  You must read complete  instructions/literature along with all the possible adverse reactions/side effects for all the Medicines you take and that have been prescribed to you. Take any new Medicines after you have completely understood and accpet all the possible adverse reactions/side effects.   Do not drive when taking Pain medications or sleeping medications  (Benzodaizepines)  Do not take more than prescribed Pain, Sleep and Anxiety Medications. It is not advisable to combine anxiety,sleep and pain medications without talking with your primary care practitioner  Special Instructions: If you have smoked or chewed Tobacco  in the last 2 yrs please stop smoking, stop any regular Alcohol  and or any Recreational drug use.  Wear Seat belts while driving.  Please note: You were cared for by a hospitalist during your hospital stay. Once you are discharged, your primary care physician will handle any further medical issues. Please note that NO REFILLS for any discharge medications will be authorized once you are discharged, as it is imperative that you return to your primary care physician (or establish a relationship with a primary care physician if you do not have one) for your post hospital discharge needs so that they can reassess your need for medications and monitor your lab values.

## 2017-07-23 NOTE — Evaluation (Signed)
Occupational Therapy Evaluation Patient Details Name: Sara Garcia MRN: [id number] DOB: Feb 27, 1928 Today's Date: 07/23/2017    History of Present Illness Sara Garcia is a 83 y.o. female with medical history significant of recent left hip fracture s/p repair; HTN; GERD; DVT; and afib on Eliquis presenting with dysarthria.  She hasn't been feeling well for 5-6 days - nausea, diarrhea and all.  Yesterday, she thought she had a UTI - her personality was different and she didn't feel well.    This AM, she was complaining of abdominal pain.  Later this afternoon, her speech was very slurred.  +dysuria day before yesterday.  Wasn't peeing as much.  Complaining about her back hurting very much, different from her usual but still midline.  No known fevers.  She is having epigastric pain, RUQ vs. LUQ - hard to tell.  Has h/o hiatal hernia.  Her therapist was there this AM and she developed aphasia/slurring of speech at that time.  No dysphagia.  No N/W/T of arms or legs.  She has noticed a bit of edema and tightness along her left calf. MRI positive for left CVA   Clinical Impression   Pt received supine in bed, agreeable to OT evaluation. Pt reports PTA she was receiving assistance for all B/ADL completion due to recent hip sx and NWB status limiting independence in ADLs. During evaluation pt requiring set-up to min assist for B/ADLs at bed level. Pt BUE strength is 4/5, coordination and sensation are intact. Pt is unclear if she was receiving HHOT services PTA, chart review indicates HHPT. Recommend HHOT services (or resumption of services) on discharge to improve safety and independence in B/ADL completion. No further acute OT needs at this time.     Follow Up Recommendations  Home health OT;Supervision/Assistance - 24 hour    Equipment Recommendations  None recommended by OT       Precautions / Restrictions Precautions Precautions: Fall Precaution Comments: recent L hip surgery and NWB  status Restrictions Weight Bearing Restrictions: Yes LLE Weight Bearing: Non weight bearing      Mobility Bed Mobility Overal bed mobility: Needs Assistance Bed Mobility:  (scooting)           General bed mobility comments: mod/max assist to scoot up to [place] transfer comment: not completed during evaluation        ADL either performed or assessed with clinical judgement   ADL Overall ADL's : Needs assistance/impaired Eating/Feeding: Modified independent;Bed level Eating/Feeding Details (indicate cue type and reason): Pt able to open packaging with no difficulties, spread butter on bread, and feed self.  Grooming: Set up;Sitting;Bed level Grooming Details (indicate cue type and reason): Pt unable to complete grooming in standing due to NWB status for LLE Upper Body Bathing: Set up;Sitting;Bed level Upper Body Bathing Details (indicate cue type and reason): Assistance to reach back Lower Body Bathing: Total assistance;Sitting/lateral leans;Bed level   Upper Body Dressing : Supervision/safety;Sitting;Bed level   Lower Body Dressing: Maximal assistance;Sitting/lateral leans;Bed level   Toilet Transfer: Minimal assistance;Stand-pivot;RW Toilet Transfer Details (indicate cue type and reason): Assist to maintain NWB status Toileting- Clothing Manipulation and Hygiene: Supervision/safety;Sitting/lateral lean               Vision Baseline Vision/History: Wears glasses Wears Glasses: Reading only Patient Visual Report: No change from baseline Vision Assessment?: No apparent visual deficits  Pertinent Vitals/Pain Pain Assessment: No/denies pain     Hand Dominance Right   Extremity/Trunk Assessment Upper Extremity Assessment Upper Extremity Assessment: Overall WFL for tasks assessed (BUE strength grossly 4/5)   Lower Extremity Assessment Lower Extremity Assessment: Defer to PT evaluation   Cervical / Trunk  Assessment Cervical / Trunk Assessment: Normal   Communication Communication Communication: Expressive difficulties   Cognition Arousal/Alertness: Awake/alert Behavior During Therapy: WFL for tasks assessed/performed Overall Cognitive Status: Within Functional Limits for tasks assessed                                                Home Living Family/patient expects to be discharged to:: Private residence Living Arrangements: Spouse/significant other Available Help at Discharge: Family;Available 24 hours/day Type of Home: House Home Access: Ramped entrance;Stairs to enter Entrance Stairs-Number of Steps: 4 Entrance Stairs-Rails: Right;Left;Can reach both Home Layout: One level     Bathroom Shower/Tub: Teacher, early years/pre: Standard     Home Equipment: Environmental consultant - 2 wheels;Wheelchair - [employer];Shower seat      Lives With: Spouse    Prior Functioning/Environment Level of Independence: Needs assistance  Gait / Transfers Assistance Needed: Assisted by family for transfers, limited for taking steps due to recent left hip surgery ADL's / Homemaking Assistance Needed: Pt has aide every other day for approximately 1-2 hours who assists with bathing/dressing/grooming. Family assisting at all other times.             OT Problem List: Decreased strength;Decreased activity tolerance;Impaired balance (sitting and/or standing);Decreased safety awareness       End of Session    Activity Tolerance: Patient tolerated treatment well Patient left: in bed;with call bell/phone within reach;with bed alarm set  OT Visit Diagnosis: Unsteadiness on feet (R26.81);Muscle weakness (generalized) (M62.81)                Time: 7371-0626 OT Time Calculation (min): 24 min Charges:  OT General Charges $OT Visit: 1 Visit OT Evaluation $OT Eval Moderate Complexity: [place], OTR/L  [phone] 07/23/2017, 8:13 AM

## 2017-07-23 NOTE — Progress Notes (Signed)
PROGRESS NOTE   Sara Garcia  CXK:[id number] DOB: 02/09/1928 DOA: 07/21/2017 PCP: Doree Albee, MD    Brief Narrative:  83 -year-old female with recent left hip fracture, history of DVT and atrial fibrillation on anticoagulation, presented with dysarthria. She is undergoing workup for possible stroke versus UTI.  Assessment & Plan:   Principal Problem:   Dysarthria Active Problems:   Essential hypertension   Chronic anticoagulation   Tachycardia   1. Acute embolic CVA with Dysarthria - Neurology consulting, recommending switch to xarelto 20 mg daily, will ask pharmacy to dose. HHPT and SLP recommending HHSLP.   2. Hypertension - stable. Following.  3. Chronic Atrial Fibrillation - resumed home diltiazem and metoprolol.  4. UTI. - Follow urine culture, recent urine culture from last month positive for E. Coli.  Continue aztreonam pending cultures.  5. Left lower shin swelling - venous dopplers negative for DVT.    6. History of DVT - continue anticoagulation.   DVT prophylaxis: apixaban----->Xarelto Code Status: DNR Family Communication: no family present Disposition Plan: TBD  Consultants:   Neurology  Antimicrobials:   Aztreonam 10/8>    Subjective: Pt without complaints this morning.   Objective: Vitals:   07/22/17 2140 07/23/17 0000 07/23/17 0400 07/23/17 0600  BP: (!) 151/95 (!) 162/76 (!) 155/78   Pulse:  (!) 132 93   Resp:  (!) 24 14   Temp:  98.8 F (37.1 C)  97.8 F (36.6 C)  TempSrc:  Oral  Oral  SpO2:  93% 97%   Weight:    65.3 kg (143 lb 15.4 oz)  Height:        Intake/Output Summary (Last 24 hours) at 07/23/17 0657 Last data filed at 07/23/17 0000  Gross per 24 hour  Intake               50 ml  Output             1450 ml  Net            -1400 ml   Filed Weights   07/21/17 1801 07/22/17 0630 07/23/17 0600  Weight: 71.2 kg (157 lb) 66.1 kg (145 lb 11.6 oz) 65.3 kg (143 lb 15.4 oz)    Examination:  General exam: Appears calm and  comfortable. Edentulous.  Respiratory system: Clear to auscultation. Respiratory effort normal. Cardiovascular system: S1 & S2 heard, irregular. Gastrointestinal system: Abdomen is nondistended, soft and nontender. No organomegaly or masses felt. Normal bowel sounds heard. Central nervous system: Alert and oriented. Remains dysarthric, no other focal deficits Extremities: Symmetric 5 x 5 power. L>R LE edema Skin: No rashes, lesions or ulcers Psychiatry: Judgement and insight appear normal. Mood & affect appropriate.   Data Reviewed: I have personally reviewed following labs and imaging studies  CBC:  Recent Labs Lab 07/21/17 1924 07/22/17 0902  WBC 8.6 8.7  NEUTROABS 3.9  --   HGB 11.4* 10.8*  HCT 35.4* 34.6*  MCV 89.8 91.1  PLT 274 970   Basic Metabolic Panel:  Recent Labs Lab 07/21/17 1924 07/21/17 2317 07/22/17 0902  NA 142  --  138  K 3.0*  --  3.5  CL 105  --  106  CO2 26  --  22  GLUCOSE 96  --  149*  BUN 26*  --  18  CREATININE 0.69  --  0.63  CALCIUM 8.6*  --  8.0*  MG  --  2.0  --    GFR: Estimated Creatinine Clearance: 44  mL/min (by C-G formula based on SCr of 0.63 mg/dL). Liver Function Tests:  Recent Labs Lab 07/21/17 1924  AST 23  ALT 17  ALKPHOS 126  BILITOT 0.8  PROT 7.0  ALBUMIN 3.5   No results for input(s): LIPASE, AMYLASE in the last 168 hours. No results for input(s): AMMONIA in the last 168 hours. Coagulation Profile:  Recent Labs Lab 07/21/17 1924  INR 1.21   Cardiac Enzymes:  Recent Labs Lab 07/21/17 1924  TROPONINI <0.03   BNP (last 3 results) No results for input(s): PROBNP in the last 8760 hours. HbA1C: No results for input(s): HGBA1C in the last 72 hours. CBG: No results for input(s): GLUCAP in the last 168 hours. Lipid Profile:  Recent Labs  07/22/17 0358  CHOL 132  HDL 50  LDLCALC 64  TRIG 88  CHOLHDL 2.6   Thyroid Function Tests:  Recent Labs  07/21/17 1924  TSH 1.163   Anemia Panel: No  results for input(s): VITAMINB12, FOLATE, FERRITIN, TIBC, IRON, RETICCTPCT in the last 72 hours. Sepsis Labs:  Recent Labs Lab 07/21/17 1936 07/21/17 2235 07/21/17 2317  PROCALCITON <0.10  --   --   LATICACIDVEN 1.4 1.56 1.1    Recent Results (from the past 240 hour(s))  MRSA PCR Screening     Status: None   Collection Time: 07/21/17 11:01 PM  Result Value Ref Range Status   MRSA by PCR NEGATIVE NEGATIVE Final    Comment:        The GeneXpert MRSA Assay (FDA approved for NASAL specimens only), is one component of a comprehensive MRSA colonization surveillance program. It is not intended to diagnose MRSA infection nor to guide or monitor treatment for MRSA infections.   Culture, blood (x 2)     Status: None (Preliminary result)   Collection Time: 07/21/17 11:10 PM  Result Value Ref Range Status   Specimen Description BLOOD RIGHT ARM  Final   Special Requests   Final    BOTTLES DRAWN AEROBIC AND ANAEROBIC Blood Culture adequate volume   Culture NO GROWTH < 12 HOURS  Final   Report Status PENDING  Incomplete  Culture, blood (x 2)     Status: None (Preliminary result)   Collection Time: 07/21/17 11:17 PM  Result Value Ref Range Status   Specimen Description BLOOD RIGHT ARM  Final   Special Requests   Final    BOTTLES DRAWN AEROBIC AND ANAEROBIC Blood Culture adequate volume   Culture NO GROWTH < 12 HOURS  Final   Report Status PENDING  Incomplete    Radiology Studies: Dg Chest 1 View  Result Date: 07/21/2017 CLINICAL DATA:  Tachycardia.  Encephalopathy. EXAM: CHEST 1 VIEW COMPARISON:  06/10/2017 FINDINGS: Shallow inspiration. Minor linear scarring in the bases. No airspace consolidation. No effusions. Normal pulmonary vasculature. Mediastinal and cardiac contours are unremarkable and unchanged. IMPRESSION: No acute cardiopulmonary findings. Electronically Signed   By: Andreas Newport M.D.   On: 07/21/2017 22:25   Ct Head Wo Contrast  Result Date: 07/21/2017 CLINICAL  DATA:  Dysuria and decreased oral intake.  Slurred speech. EXAM: CT HEAD WITHOUT CONTRAST TECHNIQUE: Contiguous axial images were obtained from the base of the skull through the vertex without intravenous contrast. COMPARISON:  06/10/2017 FINDINGS: Brain: Stable mild cerebral atrophy. There is extensive low density throughout the white matter that is chronic. No evidence for acute hemorrhage, mass lesion, midline shift, hydrocephalus or large infarct. Vascular: No hyperdense vessel or unexpected calcification. Skull: Normal. Negative for fracture or focal  lesion. Sinuses/Orbits: Small amount of disease in the left ethmoid air cells. Other: None. IMPRESSION: No acute intracranial abnormality. Extensive white matter disease appears chronic and probably represents chronic small vessel ischemic changes. Electronically Signed   By: Markus Daft M.D.   On: 07/21/2017 19:01   Mr Jodene Nam Head Wo Contrast  Result Date: 07/22/2017 CLINICAL DATA:  Acute presentation with slurred speech. EXAM: MRI HEAD WITHOUT CONTRAST MRA HEAD WITHOUT CONTRAST TECHNIQUE: Multiplanar, multiecho pulse sequences of the brain and surrounding structures were obtained without intravenous contrast. Angiographic images of the head were obtained using MRA technique without contrast. COMPARISON:  CT 07/21/2017 FINDINGS: MRI HEAD FINDINGS Brain: Background pattern of advanced chronic small-vessel ischemic changes affecting the pons, thalami, basal ganglia and hemispheric deep and subcortical white matter. Acute infarction affecting the subcortical white matter in the left posterior frontal region, measuring about 8 mm by 1.8 cm. No swelling or hemorrhage. No other acute finding. No mass lesion, hemorrhage, hydrocephalus or extra-axial collection. Vascular: Major vessels at the base of the brain show flow. Skull and upper cervical spine: Negative Sinuses/Orbits: Clear/normal Other: None MRA HEAD FINDINGS Some motion degradation. Both internal carotid  arteries are widely patent through the skullbase and siphon regions. The anterior and middle cerebral vessels are patent without proximal stenosis, aneurysm or vascular malformation. More distal branch vessels show some atherosclerotic irregularity. Both vertebral arteries are widely patent to the basilar. No basilar stenosis. Posterior circulation branch vessels are patent. Distal branches show some atherosclerotic irregularity. IMPRESSION: Background pattern of advanced chronic small-vessel ischemic changes throughout the brain. Acute white matter infarction in the left posterior frontal region measuring about 8 x 18 mm. No hemorrhage or swelling. No large or medium vessel abnormality on the MR angiogram. Distal vessel atherosclerotic changes. Electronically Signed   By: Nelson Chimes M.D.   On: 07/22/2017 10:22   Mr Brain Wo Contrast  Result Date: 07/22/2017 CLINICAL DATA:  Acute presentation with slurred speech. EXAM: MRI HEAD WITHOUT CONTRAST MRA HEAD WITHOUT CONTRAST TECHNIQUE: Multiplanar, multiecho pulse sequences of the brain and surrounding structures were obtained without intravenous contrast. Angiographic images of the head were obtained using MRA technique without contrast. COMPARISON:  CT 07/21/2017 FINDINGS: MRI HEAD FINDINGS Brain: Background pattern of advanced chronic small-vessel ischemic changes affecting the pons, thalami, basal ganglia and hemispheric deep and subcortical white matter. Acute infarction affecting the subcortical white matter in the left posterior frontal region, measuring about 8 mm by 1.8 cm. No swelling or hemorrhage. No other acute finding. No mass lesion, hemorrhage, hydrocephalus or extra-axial collection. Vascular: Major vessels at the base of the brain show flow. Skull and upper cervical spine: Negative Sinuses/Orbits: Clear/normal Other: None MRA HEAD FINDINGS Some motion degradation. Both internal carotid arteries are widely patent through the skullbase and siphon  regions. The anterior and middle cerebral vessels are patent without proximal stenosis, aneurysm or vascular malformation. More distal branch vessels show some atherosclerotic irregularity. Both vertebral arteries are widely patent to the basilar. No basilar stenosis. Posterior circulation branch vessels are patent. Distal branches show some atherosclerotic irregularity. IMPRESSION: Background pattern of advanced chronic small-vessel ischemic changes throughout the brain. Acute white matter infarction in the left posterior frontal region measuring about 8 x 18 mm. No hemorrhage or swelling. No large or medium vessel abnormality on the MR angiogram. Distal vessel atherosclerotic changes. Electronically Signed   By: Nelson Chimes M.D.   On: 07/22/2017 10:22   US Carotid Bilateral  Result Date: 07/22/2017 CLINICAL DATA:  83 year old female  with a history of CVA. Cardiovascular risk factors include hypertension, known prior stroke/TIA EXAM: BILATERAL CAROTID DUPLEX ULTRASOUND TECHNIQUE: Pearline Cables scale imaging, color Doppler and duplex ultrasound were performed of bilateral carotid and vertebral arteries in the neck. COMPARISON:  No prior duplex FINDINGS: Criteria: Quantification of carotid stenosis is based on velocity parameters that correlate the residual internal carotid diameter with NASCET-based stenosis levels, using the diameter of the distal internal carotid lumen as the denominator for stenosis measurement. The following velocity measurements were obtained: RIGHT ICA:  Systolic 57 cm/sec, Diastolic 10 cm/sec CCA:  160 cm/sec SYSTOLIC ICA/CCA RATIO:  1.4 ECA:  63 cm/sec LEFT ICA:  Systolic 53 cm/sec, Diastolic 10 cm/sec CCA:  94 cm/sec SYSTOLIC ICA/CCA RATIO:  0.6 ECA:  63 cm/sec Right Brachial SBP: Not acquired Left Brachial SBP: Not acquired RIGHT CAROTID ARTERY: No significant calcified disease of the right common carotid artery. Intermediate waveform maintained. Heterogeneous plaque without significant  calcifications at the right carotid bifurcation. Low resistance waveform of the right ICA. No significant tortuosity. RIGHT VERTEBRAL ARTERY: Antegrade flow with low resistance waveform. LEFT CAROTID ARTERY: No significant calcified disease of the left common carotid artery. Intermediate waveform maintained. Heterogeneous plaque at the left carotid bifurcation without significant calcifications. Low resistance waveform of the left ICA. LEFT VERTEBRAL ARTERY:  Antegrade flow with low resistance waveform. IMPRESSION: Color duplex indicates moderate homogeneous plaque, with no hemodynamically significant stenosis by duplex criteria in the extracranial cerebrovascular circulation. Signed, Dulcy Fanny. Earleen Newport, DO [facility] Electronically Signed   By: Corrie Mckusick D.O.   On: 07/22/2017 13:13   US Venous Img Lower Unilateral Left  Result Date: 07/22/2017 CLINICAL DATA:  Left calf edema EXAM: LEFT LOWER EXTREMITY VENOUS DOPPLER ULTRASOUND TECHNIQUE: Gray-scale sonography with graded compression, as well as color Doppler and duplex ultrasound were performed to evaluate the lower extremity deep venous systems from the level of the common femoral vein and including the common femoral, femoral, profunda femoral, popliteal and calf veins including the posterior tibial, peroneal and gastrocnemius veins when visible. The superficial great saphenous vein was also interrogated. Spectral Doppler was utilized to evaluate flow at rest and with distal augmentation maneuvers in the common femoral, femoral and popliteal veins. COMPARISON:  None. FINDINGS: Contralateral Common Femoral Vein: Respiratory phasicity is normal and symmetric with the symptomatic side. No evidence of thrombus. Normal compressibility. Common Femoral Vein: No evidence of thrombus. Normal compressibility, respiratory phasicity and response to augmentation. Saphenofemoral Junction: No evidence of thrombus.  Normal compressibility and flow on color Doppler imaging. Profunda Femoral Vein: No evidence of thrombus. Normal compressibility and flow on color Doppler imaging. Femoral Vein: No evidence of thrombus. Normal compressibility, respiratory phasicity and response to augmentation. Popliteal Vein: No evidence of thrombus. Normal compressibility, respiratory phasicity and response to augmentation. Calf Veins: No evidence of thrombus. Normal compressibility and flow on color Doppler imaging. Superficial Great Saphenous Vein: No evidence of thrombus. Normal compressibility and flow on color Doppler imaging. Venous Reflux:  None. Other Findings:  None. IMPRESSION: No evidence of DVT within the left lower extremity. Electronically Signed   By: Rolm Baptise M.D.   On: 07/22/2017 10:05   Scheduled Meds: . apixaban  5 mg Oral BID  . aspirin  300 mg Rectal Daily   Or  . aspirin  325 mg Oral Daily  . diltiazem  30 mg Oral Q12H  . DULoxetine  60 mg Oral BID  . mometasone-formoterol  2 puff Inhalation BID  . nystatin  5 mL Oral QID  . pantoprazole  40 mg Oral Daily   Continuous Infusions: . aztreonam 1 g (07/23/17 0639)     LOS: 2 days   Time spent: 32 mins  Irwin Brakeman, MD [facility] Pager [phone]  If 7PM-7AM, please contact night-coverage [email or web address] Password [email or web address] 07/23/2017, 6:57 AM   Prisha Hiley Wynetta Emery

## 2017-07-24 LAB — CBC
HCT: 35.4 % — ABNORMAL LOW (ref 36.0–46.0)
Hemoglobin: 10.9 g/dL — ABNORMAL LOW (ref 12.0–15.0)
MCH: 28.5 pg (ref 26.0–34.0)
MCHC: 30.8 g/dL (ref 30.0–36.0)
MCV: 92.4 fL (ref 78.0–100.0)
Platelets: 260 10*3/uL (ref 150–400)
RBC: 3.83 MIL/uL — ABNORMAL LOW (ref 3.87–5.11)
RDW: 15.4 % (ref 11.5–15.5)
WBC: 9.3 10*3/uL (ref 4.0–10.5)

## 2017-07-24 LAB — URINE CULTURE: Culture: >=100000 — AB

## 2017-07-24 MED ORDER — RIVAROXABAN 15 MG PO TABS: 15.0000 mg | ORAL_TABLET | Freq: Every day | ORAL | 0 refills | Status: DC

## 2017-07-24 MED ORDER — FLUCONAZOLE 100 MG PO TABS
150.0000 mg | ORAL_TABLET | Freq: Once | ORAL | Status: AC
  Administered 2017-07-24: 150 mg via ORAL
  Filled 2017-07-24: qty 2

## 2017-07-24 NOTE — Discharge Summary (Signed)
Physician Discharge Summary  Sara Garcia ZOX:[id number] DOB: 1928/07/15 DOA: 07/21/2017  PCP: Doree Albee, MD Neurologist: Merlene Laughter  Admit date: 07/21/2017 Discharge date: 07/24/2017  Admitted From: Home  Disposition: Home   Recommendations for Outpatient Follow-up:  1. Follow up with PCP in 1 weeks 2. Follow up with neurologist in 4 weeks 3. Please obtain BMP/CBC in one week  Home Health: PT, OT, SLP, RN  Discharge Condition: STABLE  CODE STATUS: DNR   Brief Hospitalization Summary: Please see all hospital notes, images, labs for full details of the hospitalization.  HPI: Sara Garcia is a 83 y.o. female with medical history significant of recent left hip fracture s/p repair; HTN; GERD; DVT; and afib on Eliquis presenting with dysarthria.  She hasn't been feeling well for 5-6 days - nausea, diarrhea and all.  Yesterday, she thought she had a UTI - her personality was different and she didn't feel well.    This AM, she was complaining of abdominal pain.  Later this afternoon, her speech was very slurred.  +dysuria day before yesterday.  Wasn't peeing as much.  Complaining about her back hurting very much, different from her usual but still midline.  No known fevers.  She is having epigastric pain, RUQ vs. LUQ - hard to tell.  Has h/o hiatal hernia.  Her therapist was there this AM and she developed aphasia/slurring of speech at that time.  No dysphagia.  No N/W/T of arms or legs.  She has noticed a bit of edema and tightness along her left calf.  She had thrush after her surgery and so family requests Nystatin swish and swallow.  Hip surgery was August 29, went to [facility] for rehab. She was in rehab for 20 days and then returned home for more therapy 2 weeks ago.     ED Course: Persistent tachycardia, mild hypokalemia, no leukocytosis.  UTI.  Head CT negative for acute CVA.   1. Acute embolic CVA with Dysarthria - Neurology consulting, recommending switch to xarelto daily,  Pharmacist dosed the xarelto to 15 mg daily taking into account her GFR.  HHPT and SLP recommending HHSLP.  Home health PT/OT/RN/SLP arranged.   2. Hypertension - stable. Following.  3. Chronic Atrial Fibrillation - resumed home diltiazem and metoprolol.  4. UTI. -  Proteus Mirabilis - treated with IV aztreonam in the hospital.   5. Left lower shin swelling - venous dopplers negative for DVT.    6. History of DVT - continue anticoagulation with Xarelto.   DVT prophylaxis: apixaban----->Xarelto 15 mg daily.  Code Status: DNR Disposition Plan: Home with home health services  Consultants:   Neurology  Antimicrobials:   Aztreonam 10/8>   Discharge Diagnoses:  Principal Problem:   Acute CVA (cerebrovascular accident) [facility]) Active Problems:   Essential hypertension   Chronic anticoagulation   Dysarthria   Tachycardia  Discharge Instructions: Discharge Instructions    Call MD for:  difficulty breathing, headache or visual disturbances    Complete by:  As directed    Call MD for:  extreme fatigue    Complete by:  As directed    Call MD for:  hives    Complete by:  As directed    Call MD for:  persistant dizziness or light-headedness    Complete by:  As directed    Call MD for:  persistant nausea and vomiting    Complete by:  As directed    Call MD for:  severe uncontrolled pain  Complete by:  As directed    Increase activity slowly    Complete by:  As directed      Allergies as of 07/24/2017      Reactions   Latex Itching, Rash   Adhesive [tape] Other (See Comments)   Tears skin   Betadine [povidone Iodine]    blisters   Ciprofloxacin Nausea And Vomiting   Codeine Nausea And Vomiting   Upsets stomach, nightmares   Penicillins Hives   Has patient had a PCN reaction causing immediate rash, facial/tongue/throat swelling, SOB or lightheadedness with hypotension: yes Has patient had a PCN reaction causing severe rash involving mucus membranes or skin necrosis:  yes Has patient had a PCN reaction that required hospitalization: no Has patient had a PCN reaction occurring within the last 10 years: no If all of the above answers are "NO", then may proceed with Cephalosporin use.   Sulfa Antibiotics Hives   Wellbutrin [bupropion]    Reaction unknown      Medication List    STOP taking these medications   apixaban 5 MG Tabs tablet Commonly known as:  ELIQUIS     TAKE these medications   acetaminophen 500 MG tablet Commonly known as:  TYLENOL Take 1,000 mg by mouth 2 (two) times daily as needed for mild pain, moderate pain or headache.   calcium-vitamin D 500-200 MG-UNIT tablet Take 1 tablet by mouth daily.   cyanocobalamin 2000 MCG tablet Take 2,000 mcg by mouth daily.   diltiazem 30 MG tablet Commonly known as:  CARDIZEM Take 1 tablet (30 mg total) by mouth 2 (two) times daily.   DULoxetine 60 MG capsule Commonly known as:  CYMBALTA Take 60 mg by mouth 2 (two) times daily.   esomeprazole 20 MG capsule Commonly known as:  NEXIUM Take 1 capsule (20 mg total) by mouth daily at 12 noon.   Fluticasone-Salmeterol 250-50 MCG/DOSE Aepb Commonly known as:  ADVAIR Inhale 1 puff into the lungs daily.   metoprolol tartrate 25 MG tablet Commonly known as:  LOPRESSOR Take 1.5 tablets (37.5 mg total) by mouth 2 (two) times daily.   polyethylene glycol powder powder Commonly known as:  GLYCOLAX/MIRALAX MIX 17 GRAMS WITH LIQUID AND TAKE BY MOUTH ONCE DAILY What changed:  See the new instructions.   pyridOXINE 100 MG tablet Commonly known as:  VITAMIN B-6 Take 100 mg by mouth daily.   Rivaroxaban 15 MG Tabs tablet Commonly known as:  XARELTO Take 1 tablet (15 mg total) by mouth daily with supper.      Follow-up Information    Doree Albee, MD. Schedule an appointment as soon as possible for a visit in 1 week(s).   Specialty:  Internal Medicine Why:  Hospital Follow Up  Contact information: [place]  [address] [phone]        Phillips Odor, MD. Schedule an appointment as soon as possible for a visit in 4 week(s).   Specialty:  Neurology Why:  Hospital follow up Stroke Contact information: [address] [place] [address] [phone]          Allergies  Allergen Reactions  . Latex Itching and Rash  . Adhesive [Tape] Other (See Comments)    Tears skin  . Betadine [Povidone Iodine]     blisters  . Ciprofloxacin Nausea And Vomiting  . Codeine Nausea And Vomiting    Upsets stomach, nightmares  . Penicillins Hives    Has patient had a PCN reaction causing immediate  rash, facial/tongue/throat swelling, SOB or lightheadedness with hypotension: yes Has patient had a PCN reaction causing severe rash involving mucus membranes or skin necrosis: yes Has patient had a PCN reaction that required hospitalization: no Has patient had a PCN reaction occurring within the last 10 years: no If all of the above answers are "NO", then may proceed with Cephalosporin use.   . Sulfa Antibiotics Hives  . Wellbutrin [Bupropion]     Reaction unknown   Current Discharge Medication List    START taking these medications   Details  Rivaroxaban (XARELTO) 15 MG TABS tablet Take 1 tablet (15 mg total) by mouth daily with supper. Qty: 30 tablet, Refills: 0      CONTINUE these medications which have NOT CHANGED   Details  acetaminophen (TYLENOL) 500 MG tablet Take 1,000 mg by mouth 2 (two) times daily as needed for mild pain, moderate pain or headache.     Calcium Carb-Cholecalciferol (CALCIUM-VITAMIN D) 500-200 MG-UNIT tablet Take 1 tablet by mouth daily.    cyanocobalamin 2000 MCG tablet Take 2,000 mcg by mouth daily.    diltiazem (CARDIZEM) 30 MG tablet Take 1 tablet (30 mg total) by mouth 2 (two) times daily. Qty: 180 tablet, Refills: 1    DULoxetine (CYMBALTA) 60 MG capsule Take 60 mg by mouth 2 (two) times daily.     esomeprazole (NEXIUM) 20 MG capsule Take 1 capsule (20 mg  total) by mouth daily at 12 noon. Qty: 30 capsule, Refills: 5    Fluticasone-Salmeterol (ADVAIR) 250-50 MCG/DOSE AEPB Inhale 1 puff into the lungs daily.     metoprolol tartrate (LOPRESSOR) 25 MG tablet Take 1.5 tablets (37.5 mg total) by mouth 2 (two) times daily. Qty: 270 tablet, Refills: 1    polyethylene glycol powder (GLYCOLAX/MIRALAX) powder MIX 17 GRAMS WITH LIQUID AND TAKE BY MOUTH ONCE DAILY Qty: 527 g, Refills: 5    pyridOXINE (VITAMIN B-6) 100 MG tablet Take 100 mg by mouth daily.      STOP taking these medications     apixaban (ELIQUIS) 5 MG TABS tablet         Procedures/Studies: Dg Chest 1 View  Result Date: 07/21/2017 CLINICAL DATA:  Tachycardia.  Encephalopathy. EXAM: CHEST 1 VIEW COMPARISON:  06/10/2017 FINDINGS: Shallow inspiration. Minor linear scarring in the bases. No airspace consolidation. No effusions. Normal pulmonary vasculature. Mediastinal and cardiac contours are unremarkable and unchanged. IMPRESSION: No acute cardiopulmonary findings. Electronically Signed   By: Andreas Newport M.D.   On: 07/21/2017 22:25   Ct Head Wo Contrast  Result Date: 07/21/2017 CLINICAL DATA:  Dysuria and decreased oral intake.  Slurred speech. EXAM: CT HEAD WITHOUT CONTRAST TECHNIQUE: Contiguous axial images were obtained from the base of the skull through the vertex without intravenous contrast. COMPARISON:  06/10/2017 FINDINGS: Brain: Stable mild cerebral atrophy. There is extensive low density throughout the white matter that is chronic. No evidence for acute hemorrhage, mass lesion, midline shift, hydrocephalus or large infarct. Vascular: No hyperdense vessel or unexpected calcification. Skull: Normal. Negative for fracture or focal lesion. Sinuses/Orbits: Small amount of disease in the left ethmoid air cells. Other: None. IMPRESSION: No acute intracranial abnormality. Extensive white matter disease appears chronic and probably represents chronic small vessel ischemic changes.  Electronically Signed   By: Markus Daft M.D.   On: 07/21/2017 19:01   Mr Jodene Nam Head Wo Contrast  Result Date: 07/22/2017 CLINICAL DATA:  Acute presentation with slurred speech. EXAM: MRI HEAD WITHOUT CONTRAST MRA HEAD WITHOUT CONTRAST TECHNIQUE: Multiplanar, multiecho  pulse sequences of the brain and surrounding structures were obtained without intravenous contrast. Angiographic images of the head were obtained using MRA technique without contrast. COMPARISON:  CT 07/21/2017 FINDINGS: MRI HEAD FINDINGS Brain: Background pattern of advanced chronic small-vessel ischemic changes affecting the pons, thalami, basal ganglia and hemispheric deep and subcortical white matter. Acute infarction affecting the subcortical white matter in the left posterior frontal region, measuring about 8 mm by 1.8 cm. No swelling or hemorrhage. No other acute finding. No mass lesion, hemorrhage, hydrocephalus or extra-axial collection. Vascular: Major vessels at the base of the brain show flow. Skull and upper cervical spine: Negative Sinuses/Orbits: Clear/normal Other: None MRA HEAD FINDINGS Some motion degradation. Both internal carotid arteries are widely patent through the skullbase and siphon regions. The anterior and middle cerebral vessels are patent without proximal stenosis, aneurysm or vascular malformation. More distal branch vessels show some atherosclerotic irregularity. Both vertebral arteries are widely patent to the basilar. No basilar stenosis. Posterior circulation branch vessels are patent. Distal branches show some atherosclerotic irregularity. IMPRESSION: Background pattern of advanced chronic small-vessel ischemic changes throughout the brain. Acute white matter infarction in the left posterior frontal region measuring about 8 x 18 mm. No hemorrhage or swelling. No large or medium vessel abnormality on the MR angiogram. Distal vessel atherosclerotic changes. Electronically Signed   By: Nelson Chimes M.D.   On:  07/22/2017 10:22   Mr Brain Wo Contrast  Result Date: 07/22/2017 CLINICAL DATA:  Acute presentation with slurred speech. EXAM: MRI HEAD WITHOUT CONTRAST MRA HEAD WITHOUT CONTRAST TECHNIQUE: Multiplanar, multiecho pulse sequences of the brain and surrounding structures were obtained without intravenous contrast. Angiographic images of the head were obtained using MRA technique without contrast. COMPARISON:  CT 07/21/2017 FINDINGS: MRI HEAD FINDINGS Brain: Background pattern of advanced chronic small-vessel ischemic changes affecting the pons, thalami, basal ganglia and hemispheric deep and subcortical white matter. Acute infarction affecting the subcortical white matter in the left posterior frontal region, measuring about 8 mm by 1.8 cm. No swelling or hemorrhage. No other acute finding. No mass lesion, hemorrhage, hydrocephalus or extra-axial collection. Vascular: Major vessels at the base of the brain show flow. Skull and upper cervical spine: Negative Sinuses/Orbits: Clear/normal Other: None MRA HEAD FINDINGS Some motion degradation. Both internal carotid arteries are widely patent through the skullbase and siphon regions. The anterior and middle cerebral vessels are patent without proximal stenosis, aneurysm or vascular malformation. More distal branch vessels show some atherosclerotic irregularity. Both vertebral arteries are widely patent to the basilar. No basilar stenosis. Posterior circulation branch vessels are patent. Distal branches show some atherosclerotic irregularity. IMPRESSION: Background pattern of advanced chronic small-vessel ischemic changes throughout the brain. Acute white matter infarction in the left posterior frontal region measuring about 8 x 18 mm. No hemorrhage or swelling. No large or medium vessel abnormality on the MR angiogram. Distal vessel atherosclerotic changes. Electronically Signed   By: Nelson Chimes M.D.   On: 07/22/2017 10:22   US Carotid Bilateral  Result Date:  07/22/2017 CLINICAL DATA:  83 year old female with a history of CVA. Cardiovascular risk factors include hypertension, known prior stroke/TIA EXAM: BILATERAL CAROTID DUPLEX ULTRASOUND TECHNIQUE: Pearline Cables scale imaging, color Doppler and duplex ultrasound were performed of bilateral carotid and vertebral arteries in the neck. COMPARISON:  No prior duplex FINDINGS: Criteria: Quantification of carotid stenosis is based on velocity parameters that correlate the residual internal carotid diameter with NASCET-based stenosis levels, using the diameter of the distal internal carotid lumen as the denominator for  stenosis measurement. The following velocity measurements were obtained: RIGHT ICA:  Systolic 57 cm/sec, Diastolic 10 cm/sec CCA:  008 cm/sec SYSTOLIC ICA/CCA RATIO:  1.4 ECA:  63 cm/sec LEFT ICA:  Systolic 53 cm/sec, Diastolic 10 cm/sec CCA:  94 cm/sec SYSTOLIC ICA/CCA RATIO:  0.6 ECA:  63 cm/sec Right Brachial SBP: Not acquired Left Brachial SBP: Not acquired RIGHT CAROTID ARTERY: No significant calcified disease of the right common carotid artery. Intermediate waveform maintained. Heterogeneous plaque without significant calcifications at the right carotid bifurcation. Low resistance waveform of the right ICA. No significant tortuosity. RIGHT VERTEBRAL ARTERY: Antegrade flow with low resistance waveform. LEFT CAROTID ARTERY: No significant calcified disease of the left common carotid artery. Intermediate waveform maintained. Heterogeneous plaque at the left carotid bifurcation without significant calcifications. Low resistance waveform of the left ICA. LEFT VERTEBRAL ARTERY:  Antegrade flow with low resistance waveform. IMPRESSION: Color duplex indicates moderate homogeneous plaque, with no hemodynamically significant stenosis by duplex criteria in the extracranial cerebrovascular circulation. Signed, Dulcy Fanny. Earleen Newport, DO [facility] Electronically Signed   By:  Corrie Mckusick D.O.   On: 07/22/2017 13:13   US Venous Img Lower Unilateral Left  Result Date: 07/22/2017 CLINICAL DATA:  Left calf edema EXAM: LEFT LOWER EXTREMITY VENOUS DOPPLER ULTRASOUND TECHNIQUE: Gray-scale sonography with graded compression, as well as color Doppler and duplex ultrasound were performed to evaluate the lower extremity deep venous systems from the level of the common femoral vein and including the common femoral, femoral, profunda femoral, popliteal and calf veins including the posterior tibial, peroneal and gastrocnemius veins when visible. The superficial great saphenous vein was also interrogated. Spectral Doppler was utilized to evaluate flow at rest and with distal augmentation maneuvers in the common femoral, femoral and popliteal veins. COMPARISON:  None. FINDINGS: Contralateral Common Femoral Vein: Respiratory phasicity is normal and symmetric with the symptomatic side. No evidence of thrombus. Normal compressibility. Common Femoral Vein: No evidence of thrombus. Normal compressibility, respiratory phasicity and response to augmentation. Saphenofemoral Junction: No evidence of thrombus. Normal compressibility and flow on color Doppler imaging. Profunda Femoral Vein: No evidence of thrombus. Normal compressibility and flow on color Doppler imaging. Femoral Vein: No evidence of thrombus. Normal compressibility, respiratory phasicity and response to augmentation. Popliteal Vein: No evidence of thrombus. Normal compressibility, respiratory phasicity and response to augmentation. Calf Veins: No evidence of thrombus. Normal compressibility and flow on color Doppler imaging. Superficial Great Saphenous Vein: No evidence of thrombus. Normal compressibility and flow on color Doppler imaging. Venous Reflux:  None. Other Findings:  None. IMPRESSION: No evidence of DVT within the left lower extremity. Electronically Signed   By: Rolm Baptise M.D.   On: 07/22/2017 10:05      Subjective: Pt  without complaints.  Says that she will go home with her husband.    Discharge Exam: Vitals:   07/24/17 0727 07/24/17 0800  BP:  (!) 157/83  Pulse:  (!) 121  Resp:  20  Temp:  98.7 F (37.1 C)  SpO2: 98% 90%   Vitals:   07/24/17 0000 07/24/17 0400 07/24/17 0727 07/24/17 0800  BP: (!) 152/70 140/82  (!) 157/83  Pulse: 99 75  (!) 121  Resp: 16 15  20   Temp: 98.7 F (37.1 C) 98.6 F (37 C)  98.7 F (37.1 C)  TempSrc: Tympanic Oral  Oral  SpO2: 92% 94% 98% 90%  Weight:      Height:        General: Pt is  alert, awake, not in acute distress, dysarthria still present but less pronounced. Cardiovascular: irregular S1/S2 +, no rubs, no gallops Respiratory: CTA bilaterally, no wheezing, no rhonchi Abdominal: Soft, NT, ND, bowel sounds + Extremities:  no cyanosis   The results of significant diagnostics from this hospitalization (including imaging, microbiology, ancillary and laboratory) are listed below for reference.     Microbiology: Recent Results (from the past 240 hour(s))  Urine Culture     Status: Abnormal   Collection Time: 07/21/17  6:13 PM  Result Value Ref Range Status   Specimen Description URINE, CATHETERIZED  Final   Special Requests NONE  Final   Culture >=100,000 COLONIES/mL PROTEUS MIRABILIS (A)  Final   Report Status 07/24/2017 FINAL  Final   Organism ID, Bacteria PROTEUS MIRABILIS (A)  Final      Susceptibility   Proteus mirabilis - MIC*    AMPICILLIN <=2 SENSITIVE Sensitive     CEFAZOLIN <=4 SENSITIVE Sensitive     CEFTRIAXONE <=1 SENSITIVE Sensitive     CIPROFLOXACIN <=0.25 SENSITIVE Sensitive     GENTAMICIN <=1 SENSITIVE Sensitive     IMIPENEM 4 SENSITIVE Sensitive     NITROFURANTOIN 128 RESISTANT Resistant     TRIMETH/SULFA <=20 SENSITIVE Sensitive     AMPICILLIN/SULBACTAM <=2 SENSITIVE Sensitive     PIP/TAZO <=4 SENSITIVE Sensitive     * >=100,000 COLONIES/mL PROTEUS MIRABILIS  MRSA PCR Screening     Status: None   Collection Time: 07/21/17  11:01 PM  Result Value Ref Range Status   MRSA by PCR NEGATIVE NEGATIVE Final    Comment:        The GeneXpert MRSA Assay (FDA approved for NASAL specimens only), is one component of a comprehensive MRSA colonization surveillance program. It is not intended to diagnose MRSA infection nor to guide or monitor treatment for MRSA infections.   Culture, blood (x 2)     Status: None (Preliminary result)   Collection Time: 07/21/17 11:10 PM  Result Value Ref Range Status   Specimen Description BLOOD RIGHT ARM  Final   Special Requests   Final    BOTTLES DRAWN AEROBIC AND ANAEROBIC Blood Culture adequate volume   Culture NO GROWTH 3 DAYS  Final   Report Status PENDING  Incomplete  Culture, blood (x 2)     Status: None (Preliminary result)   Collection Time: 07/21/17 11:17 PM  Result Value Ref Range Status   Specimen Description BLOOD RIGHT ARM  Final   Special Requests   Final    BOTTLES DRAWN AEROBIC AND ANAEROBIC Blood Culture adequate volume   Culture NO GROWTH 3 DAYS  Final   Report Status PENDING  Incomplete     Labs: BNP (last 3 results) No results for input(s): BNP in the last 8760 hours. Basic Metabolic Panel:  Recent Labs Lab 07/21/17 1924 07/21/17 2317 07/22/17 0902  NA 142  --  138  K 3.0*  --  3.5  CL 105  --  106  CO2 26  --  22  GLUCOSE 96  --  149*  BUN 26*  --  18  CREATININE 0.69  --  0.63  CALCIUM 8.6*  --  8.0*  MG  --  2.0  --    Liver Function Tests:  Recent Labs Lab 07/21/17 1924  AST 23  ALT 17  ALKPHOS 126  BILITOT 0.8  PROT 7.0  ALBUMIN 3.5   No results for input(s): LIPASE, AMYLASE in the last 168 hours. No results for  input(s): AMMONIA in the last 168 hours. CBC:  Recent Labs Lab 07/21/17 1924 07/22/17 0902 07/24/17 0431  WBC 8.6 8.7 9.3  NEUTROABS 3.9  --   --   HGB 11.4* 10.8* 10.9*  HCT 35.4* 34.6* 35.4*  MCV 89.8 91.1 92.4  PLT 274 272 260   Cardiac Enzymes:  Recent Labs Lab 07/21/17 1924  TROPONINI <0.03    BNP: Invalid input(s): POCBNP CBG: No results for input(s): GLUCAP in the last 168 hours. D-Dimer No results for input(s): DDIMER in the last 72 hours. Hgb A1c No results for input(s): HGBA1C in the last 72 hours. Lipid Profile  Recent Labs  07/22/17 0358  CHOL 132  HDL 50  LDLCALC 64  TRIG 88  CHOLHDL 2.6   Thyroid function studies  Recent Labs  07/21/17 1924  TSH 1.163   Anemia work up No results for input(s): VITAMINB12, FOLATE, FERRITIN, TIBC, IRON, RETICCTPCT in the last 72 hours. Urinalysis    Component Value Date/Time   COLORURINE YELLOW 07/21/2017 1812   APPEARANCEUR CLEAR 07/21/2017 1812   LABSPEC 1.021 07/21/2017 1812   PHURINE 5.0 07/21/2017 1812   GLUCOSEU NEGATIVE 07/21/2017 1812   HGBUR NEGATIVE 07/21/2017 1812   BILIRUBINUR NEGATIVE 07/21/2017 1812   KETONESUR 5 (A) 07/21/2017 1812   PROTEINUR NEGATIVE 07/21/2017 1812   UROBILINOGEN 0.2 10/20/2013 1434   NITRITE NEGATIVE 07/21/2017 1812   LEUKOCYTESUR TRACE (A) 07/21/2017 1812   Sepsis Labs Invalid input(s): PROCALCITONIN,  WBC,  LACTICIDVEN Microbiology Recent Results (from the past 240 hour(s))  Urine Culture     Status: Abnormal   Collection Time: 07/21/17  6:13 PM  Result Value Ref Range Status   Specimen Description URINE, CATHETERIZED  Final   Special Requests NONE  Final   Culture >=100,000 COLONIES/mL PROTEUS MIRABILIS (A)  Final   Report Status 07/24/2017 FINAL  Final   Organism ID, Bacteria PROTEUS MIRABILIS (A)  Final      Susceptibility   Proteus mirabilis - MIC*    AMPICILLIN <=2 SENSITIVE Sensitive     CEFAZOLIN <=4 SENSITIVE Sensitive     CEFTRIAXONE <=1 SENSITIVE Sensitive     CIPROFLOXACIN <=0.25 SENSITIVE Sensitive     GENTAMICIN <=1 SENSITIVE Sensitive     IMIPENEM 4 SENSITIVE Sensitive     NITROFURANTOIN 128 RESISTANT Resistant     TRIMETH/SULFA <=20 SENSITIVE Sensitive     AMPICILLIN/SULBACTAM <=2 SENSITIVE Sensitive     PIP/TAZO <=4 SENSITIVE Sensitive     *  >=100,000 COLONIES/mL PROTEUS MIRABILIS  MRSA PCR Screening     Status: None   Collection Time: 07/21/17 11:01 PM  Result Value Ref Range Status   MRSA by PCR NEGATIVE NEGATIVE Final    Comment:        The GeneXpert MRSA Assay (FDA approved for NASAL specimens only), is one component of a comprehensive MRSA colonization surveillance program. It is not intended to diagnose MRSA infection nor to guide or monitor treatment for MRSA infections.   Culture, blood (x 2)     Status: None (Preliminary result)   Collection Time: 07/21/17 11:10 PM  Result Value Ref Range Status   Specimen Description BLOOD RIGHT ARM  Final   Special Requests   Final    BOTTLES DRAWN AEROBIC AND ANAEROBIC Blood Culture adequate volume   Culture NO GROWTH 3 DAYS  Final   Report Status PENDING  Incomplete  Culture, blood (x 2)     Status: None (Preliminary result)   Collection Time: 07/21/17 11:17 PM  Result Value Ref Range Status   Specimen Description BLOOD RIGHT ARM  Final   Special Requests   Final    BOTTLES DRAWN AEROBIC AND ANAEROBIC Blood Culture adequate volume   Culture NO GROWTH 3 DAYS  Final   Report Status PENDING  Incomplete   Time coordinating discharge: 38 mins  SIGNED:  Irwin Brakeman, MD  [facility] 07/24/2017, 10:31 AM Pager [phone]  If 7PM-7AM, please contact night-coverage [email or web address] Password [email or web address]

## 2017-07-24 NOTE — Progress Notes (Signed)
Physical Therapy Treatment Patient Details Name: TANINA BARB MRN: [id number] DOB: 10/13/1928 Today's Date: 07/24/2017    History of Present Illness TYQUASIA PANT is a 83 y.o. female with medical history significant of recent left hip fracture s/p repair; HTN; GERD; DVT; and afib on Eliquis presenting with dysarthria.  She hasn't been feeling well for 5-6 days - nausea, diarrhea and all.  Yesterday, she thought she had a UTI - her personality was different and she didn't feel well.    This AM, she was complaining of abdominal pain.  Later this afternoon, her speech was very slurred.  +dysuria day before yesterday.  Wasn't peeing as much.  Complaining about her back hurting very much, different from her usual but still midline.  No known fevers.  She is having epigastric pain, RUQ vs. LUQ - hard to tell.  Has h/o hiatal hernia.  Her therapist was there this AM and she developed aphasia/slurring of speech at that time.  No dysphagia.  No N/W/T of arms or legs.  She has noticed a bit of edema and tightness along her left calf. MRI positive for left CVA    PT Comments    Patient c/o increased fatigue and soreness in LLE today, per family patient appears tired and hard to wake up.  Patient limited today more likely due to fatigue/soreness after 2 physical therapy visits yesterday and move to medical floor.  Patient demonstrates good return for following directions and completing exercises and had patient go back to bed after therapy to get rest for possible discharge home today.  Patient will benefit from continued physical therapy in hospital and recommended venue below to increase strength, balance, endurance for safe ADLs and gait.    Follow Up Recommendations  Home health PT;Supervision/Assistance - 24 hour     Equipment Recommendations  None recommended by PT    Recommendations for Other Services       Precautions / Restrictions Precautions Precautions: Fall Restrictions Weight Bearing  Restrictions: Yes LLE Weight Bearing: Non weight bearing    Mobility  Bed Mobility Overal bed mobility: Needs Assistance Bed Mobility: Supine to Sit;Sit to Supine     Supine to sit: Supervision Sit to supine: Supervision   General bed mobility comments: requires longer time, but demonstrates good return for moving LLE  Transfers Overall transfer level: Needs assistance Equipment used: Rolling walker (2 wheeled) Transfers: Sit to/from Stand Sit to Stand: Min guard            Ambulation/Gait Ambulation/Gait assistance: Min assist Ambulation Distance (Feet): 4 Feet Assistive device: Rolling walker (2 wheeled) Gait Pattern/deviations: Decreased step length - right;Decreased stance time - right;Decreased stride length   Gait velocity interpretation: Below normal speed for age/gender General Gait Details: Patient to a few steps at bedside with good return for keeping LLE off floor   Stairs            Wheelchair Mobility    Modified Rankin (Stroke Patients Only)       Balance Overall balance assessment: Needs assistance Sitting-balance support: No upper extremity supported;Feet supported Sitting balance-Leahy Scale: Good     Standing balance support: Bilateral upper extremity supported;During functional activity Standing balance-Leahy Scale: Fair                              Cognition Arousal/Alertness: Awake/alert Behavior During Therapy: WFL for tasks assessed/performed Overall Cognitive Status: Within Functional Limits for tasks assessed  Exercises Total Joint Exercises Ankle Circles/Pumps: Supine;10 reps;Strengthening;AROM;Both Quad Sets: Supine;10 reps;Strengthening;AROM;Left Short Arc Quad: Supine;10 reps;Strengthening;AROM;Left Heel Slides: Supine;10 reps;Strengthening;AROM;Left    General Comments        Pertinent Vitals/Pain Pain Assessment: Faces Pain Score: 4  Faces  Pain Scale: Hurts little more Pain Location: left inner thigh Pain Descriptors / Indicators: Aching;Sore Pain Intervention(s): Limited activity within patient's tolerance;Monitored during session    Home Living                      Prior Function            PT Goals (current goals can now be found in the care plan section) Acute Rehab PT Goals Patient Stated Goal: Return home with family to assist PT Goal Formulation: With patient/family Time For Goal Achievement: 07/29/17 Potential to Achieve Goals: Good Progress towards PT goals: Progressing toward goals    Frequency    7X/week      PT Plan      Co-evaluation              AM-PAC PT "6 Clicks" Daily Activity  Outcome Measure    Difficulty moving from lying on back to sitting on the side of the bed? : A Little Difficulty sitting down on and standing up from a chair with arms (e.g., wheelchair, bedside commode, etc,.)?: A Little Help needed moving to and from a bed to chair (including a wheelchair)?: A Little Help needed walking in hospital room?: A Lot Help needed climbing 3-5 steps with a railing? : Total 6 Click Score: 12    End of Session   Activity Tolerance: Patient limited by fatigue Patient left: in bed;with call bell/phone within reach;with family/visitor present Nurse Communication: Mobility status PT Visit Diagnosis: Unsteadiness on feet (R26.81);Other abnormalities of gait and mobility (R26.89);Muscle weakness (generalized) (M62.81)     Time: 8338-2505 PT Time Calculation (min) (ACUTE ONLY): 24 min  Charges:  $Therapeutic Exercise: 8-22 mins $Therapeutic Activity: 8-22 mins                    G Codes:       12:18 PM, 08-23-2017 Lonell Grandchild, MPT Physical Therapist with [facility] [phone] office [phone] mobile phone

## 2017-07-24 NOTE — Care Management Note (Addendum)
Case Management Note  Patient Details  Name: Sara Garcia MRN: [id number] Date of Birth: 1928/03/30  Expected Discharge Date:  07/24/17               Expected Discharge Plan:  [place]  In-House Referral:  NA  Discharge planning Services  CM Consult  Post Acute Care Choice:  Home Health, Resumption of Svcs/PTA Provider Choice offered to:  Patient  HH Arranged:  PT, OT, RN, Speech Therapy HH Agency:  [facility]  Status of Service:  Completed, signed off  Additional Comments: [employer] rep, aware of DC home today, will pull orders from [facility]. Pt's husband and dtr at bedside. Aware HH has 48 hrs to make next visit. Has RW and WC pta. No new needs or concerns communicated by pt/family. Pt given 30-day free voucher for [employer]. dtr to  Provide transport home later today.   Sherald Barge, RN 07/24/2017, 11:58 AM

## 2017-07-24 NOTE — Progress Notes (Signed)
Removed IV. Removed tele. Reviewed d/c paperwork with patient and daughter with teach back, especially with new Xarelto medication. Wheeled patient down to front entrance where her daughter was waiting.

## 2017-07-24 NOTE — Care Management Important Message (Signed)
Important Message  Patient Details  Name: SHERICE IJAMES MRN: [id number] Date of Birth: 04-21-1928   Medicare Important Message Given:  Yes    Sherald Barge, RN 07/24/2017, 12:00 PM

## 2017-07-26 LAB — CULTURE, BLOOD (ROUTINE X 2)
Culture: NO GROWTH
Culture: NO GROWTH
Special Requests: ADEQUATE
Special Requests: ADEQUATE

## 2017-07-28 ENCOUNTER — Telehealth: Payer: Self-pay | Admitting: Cardiology

## 2017-07-28 NOTE — Telephone Encounter (Signed)
Pt daughter aware that pt should not be taking ASA daily - on Xarelto - recent hospital admission - pt has f/u with Dr Harl Bowie on 10/24

## 2017-07-28 NOTE — Telephone Encounter (Signed)
Daughter would like to know if patient is to be taking a baby aspirin a day?

## 2017-07-29 ENCOUNTER — Telehealth: Payer: Self-pay | Admitting: Cardiology

## 2017-07-29 NOTE — Telephone Encounter (Signed)
Sara Garcia -daughter on the phone stating that she has woken up throwing up. Wanting to know if she can take Phenergan. Please call [phone].

## 2017-07-29 NOTE — Telephone Encounter (Signed)
Pt daughter aware that pt could take phenergan for nausea - daughter says pt vomited only once this morning and would give her phenergan if she vomited again but didn't want to give pt additional meds if not needed. Pt daughter says she will monitor pt and f/u with pcp if symptoms worsen or do not improve.

## 2017-08-06 ENCOUNTER — Ambulatory Visit (INDEPENDENT_AMBULATORY_CARE_PROVIDER_SITE_OTHER): Payer: Medicare HMO | Admitting: Cardiology

## 2017-08-06 ENCOUNTER — Encounter: Payer: Self-pay | Admitting: Cardiology

## 2017-08-06 VITALS — BP 138/82 | HR 73 | Ht 60.0 in

## 2017-08-06 DIAGNOSIS — Z8673 Personal history of transient ischemic attack (TIA), and cerebral infarction without residual deficits: Secondary | ICD-10-CM

## 2017-08-06 DIAGNOSIS — I10 Essential (primary) hypertension: Secondary | ICD-10-CM | POA: Diagnosis not present

## 2017-08-06 DIAGNOSIS — I4891 Unspecified atrial fibrillation: Secondary | ICD-10-CM

## 2017-08-06 MED ORDER — DILTIAZEM HCL 60 MG PO TABS: 60.0000 mg | ORAL_TABLET | Freq: Two times a day (BID) | ORAL | 1 refills | Status: DC

## 2017-08-06 MED ORDER — RIVAROXABAN 15 MG PO TABS: 15.0000 mg | ORAL_TABLET | Freq: Every day | ORAL | 1 refills | Status: DC

## 2017-08-06 MED ORDER — RIVAROXABAN 15 MG PO TABS: 15.0000 mg | ORAL_TABLET | Freq: Every day | ORAL | 0 refills | Status: DC

## 2017-08-06 NOTE — Progress Notes (Signed)
Clinical Summary Sara Garcia is a 83 y.o.female seen today for follow up of the following medical problems.   1. Parox afib - on beta blocker for rate control, eliquis stroke prophylaxis. - previously we had icreased her lopressor to 50mg  bid from 37.5 mg bid. With change she reported worsening fluttering in her chest and fatigue, we cut dose back to 37.5mg  bid.and she felt better   - recent episode of palpitations. Heart rates up to 120 at times. Took her evening dose of dilt, heart rates came down 75.   2. Dysphagia - followed by GI  3. HTN - remains complant with meds   4. 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    Past Medical History:  Diagnosis Date  . A-fib ([place])   . Arthritis   . Asthma   . DVT (deep venous thrombosis) ([place]) 2011/2012   on Eliquis  . Esophageal dysphagia    limited food and pill  . Esophageal reflux   . Essential hypertension, benign   . GERD (gastroesophageal reflux disease)   . Osteoporosis   . Unspecified prolapse of vaginal walls      Allergies  Allergen Reactions  . Latex Itching and Rash  . Adhesive [Tape] Other (See Comments)    Tears skin  . Betadine [Povidone Iodine]     blisters  . Ciprofloxacin Nausea And Vomiting  . Codeine Nausea And Vomiting    Upsets stomach, nightmares  . Penicillins Hives    Has patient had a PCN reaction causing immediate rash, facial/tongue/throat swelling, SOB or lightheadedness with hypotension: yes Has patient had a PCN reaction causing severe rash involving mucus membranes or skin necrosis: yes Has patient had a PCN reaction that required hospitalization: no Has patient had a PCN reaction occurring within the last 10 years: no If all of the above answers are "NO", then may proceed with Cephalosporin use.   . Sulfa Antibiotics Hives  . Wellbutrin [Bupropion]     Reaction unknown     Current  Outpatient Prescriptions  Medication Sig Dispense Refill  . acetaminophen (TYLENOL) 500 MG tablet Take 1,000 mg by mouth 2 (two) times daily as needed for mild pain, moderate pain or headache.     . Calcium Carb-Cholecalciferol (CALCIUM-VITAMIN D) 500-200 MG-UNIT tablet Take 1 tablet by mouth daily.    . cyanocobalamin 2000 MCG tablet Take 2,000 mcg by mouth daily.    Marland Kitchen diltiazem (CARDIZEM) 30 MG tablet Take 1 tablet (30 mg total) by mouth 2 (two) times daily. 180 tablet 1  . DULoxetine (CYMBALTA) 60 MG capsule Take 60 mg by mouth 2 (two) times daily.     Marland Kitchen esomeprazole (NEXIUM) 20 MG capsule Take 1 capsule (20 mg total) by mouth daily at 12 noon. 30 capsule 5  . Fluticasone-Salmeterol (ADVAIR) 250-50 MCG/DOSE AEPB Inhale 1 puff into the lungs daily.     . metoprolol tartrate (LOPRESSOR) 25 MG tablet Take 1.5 tablets (37.5 mg total) by mouth 2 (two) times daily. 270 tablet 1  . polyethylene glycol powder (GLYCOLAX/MIRALAX) powder MIX 17 GRAMS WITH LIQUID AND TAKE BY MOUTH ONCE DAILY (Patient taking differently: MIX 17 GRAMS WITH LIQUID AND TAKE BY MOUTH DAILY AS NEEDED FOR CONSTIPATION.) 527 g 5  . pyridOXINE (VITAMIN B-6) 100 MG tablet Take 100 mg by mouth daily.    . Rivaroxaban (XARELTO) 15 MG TABS tablet Take 1 tablet (15  mg total) by mouth daily with supper. 30 tablet 0   No current facility-administered medications for this visit.      Past Surgical History:  Procedure Laterality Date  . ABDOMINAL HYSTERECTOMY    . COLONOSCOPY W/ BIOPSIES  02/01/2004   RMR: Anal papilla with normal rectum/Sigmoid diverticula/Small polyps in the cecum removed as described above. Inflammatory polyp  . ESOPHAGEAL DILATION     unknown year per patient.  . ESOPHAGEAL DILATION N/A 02/07/2015   RMR: Normal esophagus status post maloney dilation. Small hiatal hernia.   Marland Kitchen ESOPHAGOGASTRODUODENOSCOPY     unknown year per patient  . ESOPHAGOGASTRODUODENOSCOPY N/A 02/07/2015   RMR: Normal esophagus  status post   Maloney dilation. Small hiatal hernia  . FEMUR IM NAIL Left 06/11/2017   Procedure: INTRAMEDULLARY (IM) NAIL FEMORAL;  Surgeon: Rod Can, MD;  Location: [facility];  Service: Orthopedics;  Laterality: Left;  . NASAL SEPTUM SURGERY    . TONSILLECTOMY    . WRIST FRACTURE SURGERY       Allergies  Allergen Reactions  . Latex Itching and Rash  . Adhesive [Tape] Other (See Comments)    Tears skin  . Betadine [Povidone Iodine]     blisters  . Ciprofloxacin Nausea And Vomiting  . Codeine Nausea And Vomiting    Upsets stomach, nightmares  . Penicillins Hives    Has patient had a PCN reaction causing immediate rash, facial/tongue/throat swelling, SOB or lightheadedness with hypotension: yes Has patient had a PCN reaction causing severe rash involving mucus membranes or skin necrosis: yes Has patient had a PCN reaction that required hospitalization: no Has patient had a PCN reaction occurring within the last 10 years: no If all of the above answers are "NO", then may proceed with Cephalosporin use.   . Sulfa Antibiotics Hives  . Wellbutrin [Bupropion]     Reaction unknown      Family History  Problem Relation Age of Onset  . Colon polyps Neg Hx   . Colon cancer Neg Hx      Social History Sara Garcia reports that she has never smoked. She has never used smokeless tobacco. Sara Garcia reports that she does not drink alcohol.   Review of Systems CONSTITUTIONAL: No weight loss, fever, chills, weakness or fatigue.  HEENT: Eyes: No visual loss, blurred vision, double vision or yellow sclerae.No hearing loss, sneezing, congestion, runny nose or sore throat.  SKIN: No rash or itching.  CARDIOVASCULAR: per hpi RESPIRATORY: No shortness of breath, cough or sputum.  GASTROINTESTINAL: No anorexia, nausea, vomiting or diarrhea. No abdominal pain or blood.  GENITOURINARY: No burning on urination, no polyuria NEUROLOGICAL: No headache, dizziness, syncope, paralysis, ataxia, numbness or tingling  in the extremities. No change in bowel or bladder control.  MUSCULOSKELETAL: No muscle, back pain, joint pain or stiffness.  LYMPHATICS: No enlarged nodes. No history of splenectomy.  PSYCHIATRIC: No history of depression or anxiety.  ENDOCRINOLOGIC: No reports of sweating, cold or heat intolerance. No polyuria or polydipsia.  Marland Kitchen   Physical Examination Vitals:   08/06/17 1027  BP: 138/82  Pulse: 73  SpO2: 97%   Vitals:   08/06/17 1027  Height: 5' (1.524 m)    Gen: resting comfortably, no acute distress HEENT: no scleral icterus, pupils equal round and reactive, no palptable cervical adenopathy,  CV: RRR, no m/r/g, no jvd Resp: Clear to auscultation bilaterally GI: abdomen is soft, non-tender, non-distended, normal bowel sounds, no hepatosplenomegaly MSK: extremities are warm, no edema.  Skin: warm, no  rash Neuro:  no focal deficits Psych: appropriate affect   Diagnostic Studies 12/2013 Lexicscan MPI Impression Exercise Capacity: Lexiscan with no exercise.  BP Response: Normal blood pressure response.  Clinical Symptoms: Nausea  ECG Impression: No significant ST segment change suggestive of ischemia.  Comparison with Prior Nuclear Study: No previous nuclear study performed  Overall Impression: Normal stress nuclear study.  LV Ejection Fraction: 85%. LV Wall Motion: NL LV Function; NL Wall Motion    Jan 2015 echo Study Conclusions  - Study data: Technically difficult study. - Left ventricle: The cavity size was normal. Wall thickness was increased in a pattern of mild LVH. There is mild basal septal hypertrophy without obstructive gradient. Systolic function was normal. The estimated ejection fraction was in the range of 60% to 65%. Doppler parameters are consistent with abnormal left ventricular relaxation (grade 1 diastolic dysfunction). - Aortic valve: Mildly calcified annulus. Trileaflet; mildly thickened leaflets. Trivial regurgitation. Valve  area: 1.69cm^2(VTI). Valve area: 1.79cm^2 (Vmax). - Mitral valve: Mildly calcified annulus. Mildly thickened leaflets .   01/2015 Holter monitor No significant arrhythmias   07/2017 echo Study Conclusions  - Left ventricle: The cavity size was normal. There was mild focal   basal hypertrophy of the septum. Systolic function was normal.   The estimated ejection fraction was in the range of 55% to 60%.   Doppler parameters are consistent with both elevated ventricular   end-diastolic filling pressure and elevated left atrial filling   pressure. - Mitral valve: Calcified annulus. Mildly thickened leaflets . - Right ventricle: The cavity size was mildly dilated. - Atrial septum: No defect or patent foramen ovale was identified.   Assessment and Plan  1. Parox afib - recent palpitaitns, we will increase dilt to 60mg  bid - continue xarelto for stroke prevention  2. HTN - mildly above goal of <130/80, she will increase dilt as reported above   3. History of CVA - changed to [place] recently by neuro, will continue - family has requested referral to neurology Dr Jaynee Eagles, we will place referral    Arnoldo Lenis, M.D.

## 2017-08-06 NOTE — Patient Instructions (Signed)
Your physician wants you to follow-up in: [place] will receive a reminder letter in the mail two months in advance. If you don't receive a letter, please call our office to schedule the follow-up appointment.  Your physician has recommended you make the following change in your medication:   INCREASE DILTIAZEM 60 MG TWICE DAILY  WE HAVE GIVEN YOU XARELTO SAMPLES   You have been referred to [facility]  Thank you for choosing [facility]!!  \

## 2017-08-12 ENCOUNTER — Other Ambulatory Visit: Payer: Self-pay | Admitting: Gastroenterology

## 2017-08-19 ENCOUNTER — Telehealth: Payer: Self-pay | Admitting: Cardiology

## 2017-08-19 MED ORDER — DILTIAZEM HCL 60 MG PO TABS: 60.0000 mg | ORAL_TABLET | Freq: Two times a day (BID) | ORAL | 1 refills | Status: DC

## 2017-08-19 NOTE — Telephone Encounter (Signed)
Has questions about her medication changes   Not sure how she should be taking

## 2017-08-19 NOTE — Telephone Encounter (Signed)
Pt was confused about medication diltiazem changes made at [facility] with Dr Harl Bowie. Pt verbalized understanding that diltiazem was increased 60 mg bid. Pt aware that she may call back with any further questions. Also sent updated diltiazem dose to pharmacy as requested

## 2017-08-28 ENCOUNTER — Telehealth: Payer: Self-pay | Admitting: Internal Medicine

## 2017-08-28 NOTE — Telephone Encounter (Signed)
RECALL FOR CT CHEST ?

## 2017-08-28 NOTE — Telephone Encounter (Signed)
Letter mailed

## 2017-09-09 ENCOUNTER — Other Ambulatory Visit (HOSPITAL_COMMUNITY): Payer: Self-pay | Admitting: Orthopedic Surgery

## 2017-09-09 DIAGNOSIS — S72142D Displaced intertrochanteric fracture of left femur, subsequent encounter for closed fracture with routine healing: Secondary | ICD-10-CM

## 2017-09-13 DIAGNOSIS — I729 Aneurysm of unspecified site: Secondary | ICD-10-CM

## 2017-09-13 HISTORY — DX: Aneurysm of unspecified site: I72.9

## 2017-09-17 ENCOUNTER — Ambulatory Visit (HOSPITAL_COMMUNITY): Admission: RE | Admit: 2017-09-17 | Payer: Medicare HMO | Source: Ambulatory Visit

## 2017-09-17 ENCOUNTER — Ambulatory Visit (HOSPITAL_COMMUNITY)
Admission: RE | Admit: 2017-09-17 | Discharge: 2017-09-17 | Disposition: A | Discharge status: Home / Self Care | Payer: Medicare HMO | Source: Ambulatory Visit | Attending: Orthopedic Surgery | Admitting: Orthopedic Surgery

## 2017-09-17 DIAGNOSIS — X58XXXD Exposure to other specified factors, subsequent encounter: Secondary | ICD-10-CM | POA: Insufficient documentation

## 2017-09-17 DIAGNOSIS — S72142D Displaced intertrochanteric fracture of left femur, subsequent encounter for closed fracture with routine healing: Secondary | ICD-10-CM

## 2017-09-18 ENCOUNTER — Other Ambulatory Visit (HOSPITAL_COMMUNITY): Payer: Self-pay | Admitting: Orthopedic Surgery

## 2017-09-18 ENCOUNTER — Encounter (HOSPITAL_COMMUNITY): Payer: Self-pay

## 2017-09-18 ENCOUNTER — Inpatient Hospital Stay (HOSPITAL_COMMUNITY)
Admission: EM | Admit: 2017-09-18 | Discharge: 2017-09-25 | DRG: 253 | Disposition: A | Discharge status: Skilled Nursing Facility | Payer: Medicare HMO | Attending: Internal Medicine | Admitting: Internal Medicine

## 2017-09-18 ENCOUNTER — Other Ambulatory Visit: Payer: Self-pay

## 2017-09-18 ENCOUNTER — Emergency Department (HOSPITAL_COMMUNITY): Payer: Medicare HMO

## 2017-09-18 ENCOUNTER — Ambulatory Visit (HOSPITAL_COMMUNITY)
Admission: RE | Admit: 2017-09-18 | Discharge: 2017-09-18 | Disposition: A | Discharge status: Home / Self Care | Payer: Medicare HMO | Source: Ambulatory Visit | Attending: Orthopedic Surgery | Admitting: Orthopedic Surgery

## 2017-09-18 DIAGNOSIS — T82898A Other specified complication of vascular prosthetic devices, implants and grafts, initial encounter: Secondary | ICD-10-CM

## 2017-09-18 DIAGNOSIS — M79605 Pain in left leg: Secondary | ICD-10-CM

## 2017-09-18 DIAGNOSIS — S72002S Fracture of unspecified part of neck of left femur, sequela: Secondary | ICD-10-CM

## 2017-09-18 DIAGNOSIS — Z86718 Personal history of other venous thrombosis and embolism: Secondary | ICD-10-CM

## 2017-09-18 DIAGNOSIS — Z825 Family history of asthma and other chronic lower respiratory diseases: Secondary | ICD-10-CM

## 2017-09-18 DIAGNOSIS — W06XXXS Fall from bed, sequela: Secondary | ICD-10-CM | POA: Diagnosis present

## 2017-09-18 DIAGNOSIS — F329 Major depressive disorder, single episode, unspecified: Secondary | ICD-10-CM | POA: Diagnosis present

## 2017-09-18 DIAGNOSIS — I4891 Unspecified atrial fibrillation: Secondary | ICD-10-CM | POA: Diagnosis not present

## 2017-09-18 DIAGNOSIS — I724 Aneurysm of artery of lower extremity: Secondary | ICD-10-CM

## 2017-09-18 DIAGNOSIS — Z66 Do not resuscitate: Secondary | ICD-10-CM | POA: Diagnosis present

## 2017-09-18 DIAGNOSIS — J449 Chronic obstructive pulmonary disease, unspecified: Secondary | ICD-10-CM | POA: Diagnosis present

## 2017-09-18 DIAGNOSIS — J9811 Atelectasis: Secondary | ICD-10-CM | POA: Diagnosis present

## 2017-09-18 DIAGNOSIS — Z8249 Family history of ischemic heart disease and other diseases of the circulatory system: Secondary | ICD-10-CM | POA: Diagnosis not present

## 2017-09-18 DIAGNOSIS — F419 Anxiety disorder, unspecified: Secondary | ICD-10-CM | POA: Diagnosis present

## 2017-09-18 DIAGNOSIS — Z96642 Presence of left artificial hip joint: Secondary | ICD-10-CM | POA: Diagnosis present

## 2017-09-18 DIAGNOSIS — I729 Aneurysm of unspecified site: Secondary | ICD-10-CM | POA: Diagnosis present

## 2017-09-18 DIAGNOSIS — E876 Hypokalemia: Secondary | ICD-10-CM | POA: Diagnosis present

## 2017-09-18 DIAGNOSIS — Z823 Family history of stroke: Secondary | ICD-10-CM

## 2017-09-18 DIAGNOSIS — M899 Disorder of bone, unspecified: Secondary | ICD-10-CM | POA: Diagnosis not present

## 2017-09-18 DIAGNOSIS — R000 Tachycardia, unspecified: Secondary | ICD-10-CM | POA: Diagnosis present

## 2017-09-18 DIAGNOSIS — Z882 Allergy status to sulfonamides status: Secondary | ICD-10-CM | POA: Diagnosis not present

## 2017-09-18 DIAGNOSIS — Z880 Allergy status to penicillin: Secondary | ICD-10-CM

## 2017-09-18 DIAGNOSIS — K219 Gastro-esophageal reflux disease without esophagitis: Secondary | ICD-10-CM | POA: Diagnosis present

## 2017-09-18 DIAGNOSIS — Z885 Allergy status to narcotic agent status: Secondary | ICD-10-CM | POA: Diagnosis not present

## 2017-09-18 DIAGNOSIS — Z7901 Long term (current) use of anticoagulants: Secondary | ICD-10-CM | POA: Diagnosis not present

## 2017-09-18 DIAGNOSIS — M810 Age-related osteoporosis without current pathological fracture: Secondary | ICD-10-CM | POA: Diagnosis present

## 2017-09-18 DIAGNOSIS — I480 Paroxysmal atrial fibrillation: Secondary | ICD-10-CM | POA: Diagnosis present

## 2017-09-18 DIAGNOSIS — Z01810 Encounter for preprocedural cardiovascular examination: Secondary | ICD-10-CM | POA: Diagnosis not present

## 2017-09-18 DIAGNOSIS — R296 Repeated falls: Secondary | ICD-10-CM | POA: Diagnosis present

## 2017-09-18 DIAGNOSIS — Z8673 Personal history of transient ischemic attack (TIA), and cerebral infarction without residual deficits: Secondary | ICD-10-CM | POA: Diagnosis not present

## 2017-09-18 DIAGNOSIS — I770 Arteriovenous fistula, acquired: Secondary | ICD-10-CM | POA: Insufficient documentation

## 2017-09-18 DIAGNOSIS — R262 Difficulty in walking, not elsewhere classified: Secondary | ICD-10-CM | POA: Diagnosis not present

## 2017-09-18 DIAGNOSIS — Z91040 Latex allergy status: Secondary | ICD-10-CM

## 2017-09-18 DIAGNOSIS — I10 Essential (primary) hypertension: Secondary | ICD-10-CM | POA: Diagnosis present

## 2017-09-18 HISTORY — DX: Overactive bladder: N32.81

## 2017-09-18 HISTORY — DX: Aneurysm of unspecified site: I72.9

## 2017-09-18 HISTORY — DX: Cerebral infarction, unspecified: I63.9

## 2017-09-18 HISTORY — DX: Paroxysmal atrial fibrillation: I48.0

## 2017-09-18 LAB — CBC WITH DIFFERENTIAL/PLATELET
Basophils Absolute: 0 10*3/uL (ref 0.0–0.1)
Basophils Relative: 0 %
Eosinophils Absolute: 0.2 10*3/uL (ref 0.0–0.7)
Eosinophils Relative: 2 %
HCT: 35.6 % — ABNORMAL LOW (ref 36.0–46.0)
Hemoglobin: 10.9 g/dL — ABNORMAL LOW (ref 12.0–15.0)
Lymphocytes Relative: 38 %
Lymphs Abs: 3.5 10*3/uL (ref 0.7–4.0)
MCH: 26.4 pg (ref 26.0–34.0)
MCHC: 30.6 g/dL (ref 30.0–36.0)
MCV: 86.2 fL (ref 78.0–100.0)
Monocytes Absolute: 0.6 10*3/uL (ref 0.1–1.0)
Monocytes Relative: 7 %
Neutro Abs: 4.9 10*3/uL (ref 1.7–7.7)
Neutrophils Relative %: 53 %
Platelets: 350 10*3/uL (ref 150–400)
RBC: 4.13 MIL/uL (ref 3.87–5.11)
RDW: 15.3 % (ref 11.5–15.5)
WBC: 9.2 10*3/uL (ref 4.0–10.5)

## 2017-09-18 LAB — COMPREHENSIVE METABOLIC PANEL
ALT: 12 U/L — ABNORMAL LOW (ref 14–54)
AST: 22 U/L (ref 15–41)
Albumin: 3.3 g/dL — ABNORMAL LOW (ref 3.5–5.0)
Alkaline Phosphatase: 86 U/L (ref 38–126)
Anion gap: 9 (ref 5–15)
BUN: 22 mg/dL — ABNORMAL HIGH (ref 6–20)
CO2: 25 mmol/L (ref 22–32)
Calcium: 8.9 mg/dL (ref 8.9–10.3)
Chloride: 106 mmol/L (ref 101–111)
Creatinine, Ser: 0.69 mg/dL (ref 0.44–1.00)
GFR calc Af Amer: >60 mL/min (ref >60)
GFR calc non Af Amer: >60 mL/min (ref >60)
Glucose, Bld: 122 mg/dL — ABNORMAL HIGH (ref 65–99)
Potassium: 3.5 mmol/L (ref 3.5–5.1)
Sodium: 140 mmol/L (ref 135–145)
Total Bilirubin: 0.6 mg/dL (ref 0.3–1.2)
Total Protein: 6.7 g/dL (ref 6.5–8.1)

## 2017-09-18 MED ORDER — MORPHINE SULFATE (PF) 4 MG/ML IV SOLN
0.5000 mg | INTRAVENOUS | Status: DC | PRN
  Administered 2017-09-19 – 2017-09-21 (×8): 0.52 mg via INTRAVENOUS
  Filled 2017-09-18 (×8): qty 1

## 2017-09-18 MED ORDER — ONDANSETRON HCL 4 MG/2ML IJ SOLN
4.0000 mg | Freq: Four times a day (QID) | INTRAMUSCULAR | Status: DC | PRN
  Administered 2017-09-20 – 2017-09-23 (×4): 4 mg via INTRAVENOUS
  Filled 2017-09-18 (×4): qty 2

## 2017-09-18 MED ORDER — IOPAMIDOL (ISOVUE-370) INJECTION 76%
INTRAVENOUS | Status: AC
  Administered 2017-09-18: 100 mL
  Filled 2017-09-18: qty 100

## 2017-09-18 MED ORDER — HYDRALAZINE HCL 20 MG/ML IJ SOLN
10.0000 mg | Freq: Four times a day (QID) | INTRAMUSCULAR | Status: DC | PRN
  Administered 2017-09-19: 10 mg via INTRAVENOUS
  Filled 2017-09-18: qty 1

## 2017-09-18 NOTE — H&P (Signed)
TRH H&P   Patient Demographics:    Sara Garcia, is a 83 y.o. female  MRN: [id number]   DOB - Nov 30, 1927  Admit Date - 09/18/2017  Outpatient Primary MD for the patient is Doree Albee, MD  Referring MD/NP/PA:   Outpatient Specialists:  Patient coming from:    home  Chief Complaint  Patient presents with  . Groin Pain      HPI:    Sara Garcia  is a 83 y.o. female, w hx of Hypertension, Gerd, Asthma, Pafib, CVA, DVT fell out of bed August 2018 w hip fracture treated by Dr. Lyla Glassing.  The patient continued to have pain in the left groin and this prompted a CT scan of pelvis which revealed a pseudoaneurysm.  Vascular requested medical admission   CTA IMPRESSION: VASCULAR  Minimally larger 4.1 x 3.3 cm left groin collection displacing the left common femoral artery and profunda. Thin wispy contrast is seen along the periphery of this collection and is suspicious for a posttraumatic extravasation into the pseudoaneurysm off the distal aspect of the common femoral artery, just proximal to its bifurcation of the profunda. Given its intimate association with the adjacent left common femoral vein and profunda vein, a venous source of this collection is not entirely excluded. Ultrasound may help for further correlation.  NON-VASCULAR  1. Cardiomegaly without pericardial effusion. 2. Chronic right middle and left lower lobe atelectasis accounting for chronic opacities within the lungs dating back to 2016. 3. No acute intraabdominal nor pelvic abnormality.    Review of systems:    In addition to the HPI above,  No Fever-chills, No Headache, No changes with Vision or hearing, No problems swallowing food or Liquids, No Chest pain, Cough or Shortness of Breath, No Abdominal pain, No Nausea or Vommitting, Bowel movements are regular, No Blood in stool or Urine, No  dysuria, No new skin rashes or bruises,  No new weakness, tingling, numbness in any extremity, No recent weight gain or loss, No polyuria, polydypsia or polyphagia, No significant Mental Stressors.  A full 10 point Review of Systems was done, except as stated above, all other Review of Systems were negative.   With Past History of the following :    Past Medical History:  Diagnosis Date  . A-fib ([place])   . Arthritis   . Asthma   . DVT (deep venous thrombosis) ([place]) 2011/2012   on Eliquis  . Esophageal dysphagia    limited food and pill  . Esophageal reflux   . Essential hypertension, benign   . GERD (gastroesophageal reflux disease)   . Osteoporosis   . Unspecified prolapse of vaginal walls       Past Surgical History:  Procedure Laterality Date  . ABDOMINAL HYSTERECTOMY    . COLONOSCOPY W/ BIOPSIES  02/01/2004   RMR: Anal papilla with normal rectum/Sigmoid diverticula/Small polyps in the cecum removed as  described above. Inflammatory polyp  . ESOPHAGEAL DILATION     unknown year per patient.  . ESOPHAGEAL DILATION N/A 02/07/2015   RMR: Normal esophagus status post maloney dilation. Small hiatal hernia.   Marland Kitchen ESOPHAGOGASTRODUODENOSCOPY     unknown year per patient  . ESOPHAGOGASTRODUODENOSCOPY N/A 02/07/2015   RMR: Normal esophagus  status post  Maloney dilation. Small hiatal hernia  . FEMUR IM NAIL Left 06/11/2017   Procedure: INTRAMEDULLARY (IM) NAIL FEMORAL;  Surgeon: Rod Can, MD;  Location: [facility];  Service: Orthopedics;  Laterality: Left;  . NASAL SEPTUM SURGERY    . TONSILLECTOMY    . WRIST FRACTURE SURGERY        Social History:     Social History   Tobacco Use  . Smoking status: Never Smoker  . Smokeless tobacco: Never Used  Substance Use Topics  . Alcohol use: No    Alcohol/week: 0.0 oz     Lives - at home  Mobility -  Unable to walk currently    Family History :     Family History  Problem Relation Age of Onset  . Heart disease  Mother   . Stroke Mother   . Emphysema Father   . Colon polyps Neg Hx   . Colon cancer Neg Hx       Home Medications:   Prior to Admission medications   Medication Sig Start Date End Date Taking? Authorizing Provider  acetaminophen (TYLENOL) 500 MG tablet Take 1,000 mg by mouth every 6 (six) hours.    Yes [provider]  Calcium Carb-Cholecalciferol (CALCIUM-VITAMIN D) 500-200 MG-UNIT tablet Take 1 tablet by mouth daily.   Yes [provider]  diltiazem (CARDIZEM) 60 MG tablet Take 1 tablet (60 mg total) 2 (two) times daily by mouth. 08/19/17  Yes BranchAlphonse Guild, MD  DULoxetine (CYMBALTA) 60 MG capsule Take 60 mg by mouth 2 (two) times daily.    Yes [provider]  esomeprazole (NEXIUM) 20 MG capsule TAKE 1 CAPSULE BY MOUTH ONCE DAILY AT NOON 08/15/17  Yes Annitta Needs, NP  Fluticasone-Salmeterol (ADVAIR) 250-50 MCG/DOSE AEPB Inhale 1 puff into the lungs daily.    Yes [provider]  metoprolol tartrate (LOPRESSOR) 25 MG tablet Take 1.5 tablets (37.5 mg total) by mouth 2 (two) times daily. 02/24/17  Yes Branch, Alphonse Guild, MD  polyethylene glycol powder (GLYCOLAX/MIRALAX) powder MIX 17 GRAMS WITH LIQUID AND TAKE BY MOUTH ONCE DAILY Patient taking differently: MIX 17 GRAMS WITH LIQUID AND TAKE BY MOUTH DAILY AS NEEDED FOR CONSTIPATION. 12/13/16  Yes Mahala Menghini, PA-C  pyridoxine (B-6) 200 MG tablet Take 200 mg by mouth daily.   Yes [provider]  Rivaroxaban (XARELTO) 15 MG TABS tablet Take 1 tablet (15 mg total) by mouth daily with supper. 08/06/17 09/18/17 Yes BranchAlphonse Guild, MD  vitamin B-12 (CYANOCOBALAMIN) 1000 MCG tablet Take 1,000 mcg by mouth daily.   Yes [provider]     Allergies:     Allergies  Allergen Reactions  . Latex Itching and Rash  . Adhesive [Tape] Other (See Comments)    Tears skin  . Betadine [Povidone Iodine]     blisters  . Ciprofloxacin Nausea And Vomiting  . Codeine Nausea And Vomiting      Upsets stomach, nightmares  . Penicillins Hives    Has patient had a PCN reaction causing immediate rash, facial/tongue/throat swelling, SOB or lightheadedness with hypotension: yes Has patient had a PCN reaction causing severe rash  involving mucus membranes or skin necrosis: yes Has patient had a PCN reaction that required hospitalization: no Has patient had a PCN reaction occurring within the last 10 years: no If all of the above answers are "NO", then may proceed with Cephalosporin use.   . Sulfa Antibiotics Hives  . Wellbutrin [Bupropion]     Reaction unknown     Physical Exam:   Vitals  Blood pressure 139/66, pulse 72, temperature 98.2 F (36.8 C), temperature source Oral, resp. rate 18, height 5' 0.5" (1.537 m), weight 64.9 kg (143 lb), SpO2 94 %.   1. General  lying in bed in NAD,    2. Normal affect and insight, Not Suicidal or Homicidal, Awake Alert, Oriented X 3.  3. No F.N deficits, ALL C.Nerves Intact, Strength 5/5 all 4 extremities, Sensation intact all 4 extremities, Plantars down going.  4. Ears and Eyes appear Normal, Conjunctivae clear, PERRLA. Moist Oral Mucosa.  5. Supple Neck, No JVD, No cervical lymphadenopathy appriciated, No Carotid Bruits.  6. Symmetrical Chest wall movement, Good air movement bilaterally, CTAB.  7. RRR, No Gallops, Rubs or Murmurs, No Parasternal Heave.  8. Positive Bowel Sounds, Abdomen Soft, No tenderness, No organomegaly appriciated,No rebound -guarding or rigidity.  9.  No Cyanosis, Normal Skin Turgor, No Skin Rash or Bruise.    10. Good muscle tone,  joints appear normal , no effusions, Normal ROM.  11. No Palpable Lymph Nodes in Neck or Axillae      Data Review:    CBC Recent Labs  Lab 09/18/17 1431  WBC 9.2  HGB 10.9*  HCT 35.6*  PLT 350  MCV 86.2  MCH 26.4  MCHC 30.6  RDW 15.3  LYMPHSABS 3.5  MONOABS 0.6  EOSABS 0.2  BASOSABS 0.0    ------------------------------------------------------------------------------------------------------------------  Chemistries  Recent Labs  Lab 09/18/17 1431  NA 140  K 3.5  CL 106  CO2 25  GLUCOSE 122*  BUN 22*  CREATININE 0.69  CALCIUM 8.9  AST 22  ALT 12*  ALKPHOS 86  BILITOT 0.6   ------------------------------------------------------------------------------------------------------------------ estimated creatinine clearance is 42.2 mL/min (by C-G formula based on SCr of 0.69 mg/dL). ------------------------------------------------------------------------------------------------------------------ No results for input(s): TSH, T4TOTAL, T3FREE, THYROIDAB in the last 72 hours.  Invalid input(s): FREET3  Coagulation profile No results for input(s): INR, PROTIME in the last 168 hours. ------------------------------------------------------------------------------------------------------------------- No results for input(s): DDIMER in the last 72 hours. -------------------------------------------------------------------------------------------------------------------  Cardiac Enzymes No results for input(s): CKMB, TROPONINI, MYOGLOBIN in the last 168 hours.  Invalid input(s): CK ------------------------------------------------------------------------------------------------------------------ No results found for: BNP   ---------------------------------------------------------------------------------------------------------------  Urinalysis    Component Value Date/Time   COLORURINE YELLOW 07/21/2017 [place] 07/21/2017 1812   LABSPEC 1.021 07/21/2017 1812   PHURINE 5.0 07/21/2017 1812   GLUCOSEU NEGATIVE 07/21/2017 1812   HGBUR NEGATIVE 07/21/2017 1812   BILIRUBINUR NEGATIVE 07/21/2017 1812   KETONESUR 5 (A) 07/21/2017 1812   PROTEINUR NEGATIVE 07/21/2017 1812   UROBILINOGEN 0.2 10/20/2013 1434   NITRITE NEGATIVE 07/21/2017 1812    LEUKOCYTESUR TRACE (A) 07/21/2017 1812    ----------------------------------------------------------------------------------------------------------------   Imaging Results:    Ct Pelvis Wo Contrast  Result Date: 09/17/2017 CLINICAL DATA:  Left hip pain and left groin pain since a fall 5 weeks ago. Proximal left femur fracture on 06/10/2017 treated with open reduction and internal fixation. EXAM: CT PELVIS WITHOUT CONTRAST TECHNIQUE: Multidetector CT imaging of the pelvis was performed following the standard protocol without intravenous contrast. COMPARISON:  Radiographs dated 06/11/2017 and  06/10/2017 FINDINGS: Musculoskeletal: There is a 3.8 cm soft tissue mass in the left inguinal region which appears to be intimately associated with the left femoral artery and which could represent a pseudoaneurysm of the femoral artery. There is a fragment of the lesser trochanter protruding into the soft tissue density. The previous proximal left femoral fracture has completely healed. Hardware appears in good position. Urinary Tract:  No abnormality visualized. Bowel:  Scattered diverticula in the sigmoid portion of the colon. Vascular/Lymphatic: 3.8 cm soft tissue mass in the left inguinal region which cannot be separated from the left common femoral artery. This could represent an old hematoma or lymphocele but the possibility of a pseudoaneurysm should be considered. No adenopathy. Reproductive: Uterus and ovaries appear to have been removed. Pessary in place. Other:  Tiny umbilical hernia containing only fat. IMPRESSION: 1. No acute fracture. 2. Proximal left femur fracture has healed. 3. New 3.8 cm soft tissue density in the left inguinal region which could represent a pseudoaneurysm of the left common femoral artery. There is a fragment of the lesser trochanter of the proximal left femur in the adjacent soft tissues. 1 minutes into this fragment extends into the soft tissue density. Alternatively, this could  represent a lymphocele or hematoma. Doppler ultrasound is recommended for further characterization. Critical Value/emergent results were called by telephone at the time of interpretation on 09/17/2017 at 4:38 pm to Dr. Rod Can , who verbally acknowledged these results. Electronically Signed   By: Lorriane Shire M.D.   On: 09/17/2017 16:40   US Arterial Lower Extremity Limited (not Including Abi's)  Result Date: 09/18/2017 CLINICAL DATA:  Left lower extremity pain. Abnormality seen in the left groin and on prior CT concerning for pseudoaneurysm or AV fistula. EXAM: LEFT LOWER EXTREMITY ARTERIAL DUPLEX SCAN TECHNIQUE: Gray-scale sonography as well as color Doppler and duplex ultrasound was performed to evaluate the lower extremity arteries including the common, superficial and profunda femoral arteries, popliteal artery and calf arteries. COMPARISON:  CT 09/17/2017 FINDINGS: There is a large vascular masslike area noted in the left groin region which appears to be fed by the left common femoral artery compatible with pseudoaneurysm. However, there also appears to be communication to the left common femoral vein with pulsatile flow within the common femoral vein above this area compatible with AVF fistula. 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 These results were called by telephone at the time of interpretation on 09/18/2017 at 12:26 pm to Dr. Rod Can , who verbally acknowledged these results. Electronically Signed   By: Rolm Baptise M.D.   On: 09/18/2017 12:37   Ct Angio Abd/pel W And/or Wo Contrast  Result Date: 09/18/2017 CLINICAL DATA:  Left groin pain after left-sided hip surgery 06/11/2017. EXAM: CTA ABDOMEN AND PELVIS WITHOUT AND WITH CONTRAST TECHNIQUE: Multidetector CT imaging of the abdomen and pelvis was performed using the standard protocol during bolus administration of intravenous contrast. Multiplanar reconstructed images  and MIPs were obtained and reviewed to evaluate the vascular anatomy. CONTRAST:  122mL ISOVUE-370 IOPAMIDOL (ISOVUE-370) INJECTION 76% COMPARISON:  Pelvic CT 09/17/2017, CT abdomen and pelvis 09/11/2015 FINDINGS: VASCULAR Aorta: Mild aortic atherosclerosis without aneurysm. No dissection or significant stenosis. Celiac: Patent without evidence of aneurysm, dissection, vasculitis or significant stenosis. SMA: Patent without evidence of aneurysm, dissection, vasculitis or significant stenosis. Renals: Single patent renal artery to the right kidney. Dual left renal arteries, the more cephalad is diminutive in caliber IMA: Patent without evidence  of aneurysm, dissection, vasculitis or significant stenosis. Inflow: Patent without evidence of aneurysm, dissection, vasculitis or significant stenosis. Proximal Outflow: A 4.1 x 3.3 cm circumscribed soft tissue abnormality deviating the left common femoral and profunda arteries is noted in the left groin. Veins: On the venous phase, the left groin collection enhances homogeneously with displacement of the left common femoral and profunda veins. Review of the MIP images confirms the above findings. NON-VASCULAR Lower chest: Cardiomegaly without pericardial effusion. Chronic right middle lobe opacity with air bronchograms may reflect an area of chronic atelectasis. Similar finding in the left lower lobe, stable in appearance. Hepatobiliary: No focal liver abnormality is seen. No gallstones, gallbladder wall thickening, or biliary dilatation. Pancreas: Unremarkable. No pancreatic ductal dilatation or surrounding inflammatory changes. Spleen: Normal in size without focal abnormality. Adrenals/Urinary Tract: Adrenal glands are unremarkable. Kidneys are normal, without renal calculi, focal lesion, or hydronephrosis. Bladder is unremarkable. Stomach/Bowel: Decompressed stomach. Normal small bowel rotation. Moderate stool burden with scattered colonic diverticulosis. No bowel  obstruction or inflammation. Lymphatic: No lymphadenopathy. Reproductive: Pessary noted. Status post hysterectomy. No adnexal mass. Other: None Musculoskeletal: Left femoral neck fixation with intramedullary nail. Displaced ossific density anterior to the left hip joint as before consistent with a fragment of the lesser trochanter. This abuts the collection from a posterolateral approach. IMPRESSION: VASCULAR Minimally larger 4.1 x 3.3 cm left groin collection displacing the left common femoral artery and profunda. Thin wispy contrast is seen along the periphery of this collection and is suspicious for a posttraumatic extravasation into the pseudoaneurysm off the distal aspect of the common femoral artery, just proximal to its bifurcation of the profunda. Given its intimate association with the adjacent left common femoral vein and profunda vein, a venous source of this collection is not entirely excluded. Ultrasound may help for further correlation. NON-VASCULAR 1. Cardiomegaly without pericardial effusion. 2. Chronic right middle and left lower lobe atelectasis accounting for chronic opacities within the lungs dating back to 2016. 3. No acute intraabdominal nor pelvic abnormality. Electronically Signed   By: Ashley Royalty M.D.   On: 09/18/2017 22:09      Assessment & Plan:    Principal Problem:   Pseudoaneurysm ([place]) Active Problems:   Atrial fibrillation ([place])   Pseudoaneurysm Hold Xarelto (need to be off 72h) Vascular surgery on board, appreciate input  Pafib Cont metoprololol Cont cardizem Cardiology consult for cardiac clearance as per vascular surgery  Gerd Cont PPI  Copd Cont advair  Anxiety Cont cymbalta     DVT Prophylaxis  SCDs   AM Labs Ordered, also please review Full Orders  Family Communication: Admission, patients condition and plan of care including tests being ordered have been discussed with the patient  who indicate understanding and agree with the plan and Code  Status.  Code Status FULL CODE  Likely DC to  home  Condition GUARDED    Consults called: vascular by ED< cardiology by email for cardiac clearance  Admission status: inpatient  Time spent in minutes : 45   Jani Gravel M.D on 09/18/2017 at 11:30 PM  Between 7am to 7pm - Pager - [phone]  . After 7pm go to [email or web address] - password [facility]  [facility] - Office  [phone]

## 2017-09-18 NOTE — ED Notes (Signed)
Vascular surgery in room

## 2017-09-18 NOTE — Consult Note (Signed)
Patient name: Sara Garcia MRN: [id number] DOB: Feb 14, 1928 Sex: female   REASON FOR CONSULT:    Pseudoaneurysm and arteriovenous fistula left groin  HPI:   Sara Garcia is a pleasant 83 y.o. female, fell out of bed in August and sustained a hip fracture.  This was repaired by Dr. Lyla Glassing.  The patient states that she has been having pain in the left groin since this incident but that the pain recently has gotten severe.  This prompted a CT scan of the pelvis yesterday which showed possible hematoma versus a pseudoaneurysm he was set up for an outpatient duplex scan at [facility].  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  Reason patient was sent to the [facility] emergency department to be admitted by medicine plans for vascular surgery consultation.  Patient is 83 years old but is reasonably active.  Notes she did have a stroke in September associated with some slurred speech.  This has resolved.  She also has a history of atrial fibrillation is on Xarelto.  She has a history of DVTs in the past.  Prior to this incident I do not get any history of claudication, rest pain, or nonhealing ulcers.  The patient has had some left leg swelling.  Cardiologist is Dr. Harl Bowie.  Past Medical History:  Diagnosis Date  . A-fib ([place])   . Arthritis   . Asthma   . DVT (deep venous thrombosis) ([place]) 2011/2012   on Eliquis  . Esophageal dysphagia    limited food and pill  . Esophageal reflux   . Essential hypertension, benign   . GERD (gastroesophageal reflux disease)   . Osteoporosis   . Unspecified prolapse of vaginal walls     Family History  Problem Relation Age of Onset  . Heart disease Mother   . Stroke Mother   . Emphysema Father   . Colon polyps Neg Hx   . Colon cancer Neg Hx     SOCIAL HISTORY: Social History   Socioeconomic History  . Marital status: Married    Spouse name: Not on file  .  Number of children: Not on file  . Years of education: Not on file  . Highest education level: Not on file  Social Needs  . Financial resource strain: Not on file  . Food insecurity - worry: Not on file  . Food insecurity - inability: Not on file  . Transportation needs - medical: Not on file  . Transportation needs - non-medical: Not on file  Occupational History  . Not on file  Tobacco Use  . Smoking status: Never Smoker  . Smokeless tobacco: Never Used  Substance and Sexual Activity  . Alcohol use: No    Alcohol/week: 0.0 oz  . Drug use: No  . Sexual activity: Not Currently    Birth control/protection: None  Other Topics Concern  . Not on file  Social History Narrative  . Not on file    Allergies  Allergen Reactions  . Latex Itching and Rash  . Adhesive [Tape] Other (See Comments)    Tears skin  . Betadine [Povidone Iodine]     blisters  . Ciprofloxacin Nausea And Vomiting  . Codeine Nausea And Vomiting    Upsets stomach, nightmares  . Penicillins Hives    Has patient had a PCN reaction causing immediate rash, facial/tongue/throat swelling, SOB or lightheadedness with hypotension: yes Has patient  had a PCN reaction causing severe rash involving mucus membranes or skin necrosis: yes Has patient had a PCN reaction that required hospitalization: no Has patient had a PCN reaction occurring within the last 10 years: no If all of the above answers are "NO", then may proceed with Cephalosporin use.   . Sulfa Antibiotics Hives  . Wellbutrin [Bupropion]     Reaction unknown    No current facility-administered medications for this encounter.    Current Outpatient Medications  Medication Sig Dispense Refill  . acetaminophen (TYLENOL) 500 MG tablet Take 1,000 mg by mouth every 6 (six) hours.     . Calcium Carb-Cholecalciferol (CALCIUM-VITAMIN D) 500-200 MG-UNIT tablet Take 1 tablet by mouth daily.    Marland Kitchen diltiazem (CARDIZEM) 60 MG tablet Take 1 tablet (60 mg total) 2 (two)  times daily by mouth. 180 tablet 1  . DULoxetine (CYMBALTA) 60 MG capsule Take 60 mg by mouth 2 (two) times daily.     Marland Kitchen esomeprazole (NEXIUM) 20 MG capsule TAKE 1 CAPSULE BY MOUTH ONCE DAILY AT NOON 30 capsule 4  . Fluticasone-Salmeterol (ADVAIR) 250-50 MCG/DOSE AEPB Inhale 1 puff into the lungs daily.     . metoprolol tartrate (LOPRESSOR) 25 MG tablet Take 1.5 tablets (37.5 mg total) by mouth 2 (two) times daily. 270 tablet 1  . polyethylene glycol powder (GLYCOLAX/MIRALAX) powder MIX 17 GRAMS WITH LIQUID AND TAKE BY MOUTH ONCE DAILY (Patient taking differently: MIX 17 GRAMS WITH LIQUID AND TAKE BY MOUTH DAILY AS NEEDED FOR CONSTIPATION.) 527 g 5  . pyridoxine (B-6) 200 MG tablet Take 200 mg by mouth daily.    . Rivaroxaban (XARELTO) 15 MG TABS tablet Take 1 tablet (15 mg total) by mouth daily with supper. 28 tablet 0  . vitamin B-12 (CYANOCOBALAMIN) 1000 MCG tablet Take 1,000 mcg by mouth daily.      REVIEW OF SYSTEMS:  [X]  denotes positive finding, [ ]  denotes negative finding Cardiac  Comments:  Chest pain or chest pressure:    Shortness of breath upon exertion:    Short of breath when lying flat:    Irregular heart rhythm: X  atrial fibrillation      Vascular    Pain in calf, thigh, or hip brought on by ambulation:    Pain in feet at night that wakes you up from your sleep:     Blood clot in your veins:    Leg swelling:  X   left leg swelling      Pulmonary    Oxygen at home:    Productive cough:     Wheezing:         Neurologic    Sudden weakness in arms or legs:     Sudden numbness in arms or legs:     Sudden onset of difficulty speaking or slurred speech:    Temporary loss of vision in one eye:     Problems with dizziness:         Gastrointestinal    Blood in stool:     Vomited blood:         Genitourinary    Burning when urinating:     Blood in urine:        Psychiatric    Major depression:         Hematologic    Bleeding problems:    Problems with blood  clotting too easily:        Skin    Rashes or ulcers:  Constitutional    Fever or chills:     PHYSICAL EXAM:   Vitals:   09/18/17 1419 09/18/17 1800 09/18/17 1845 09/18/17 1945  BP:  (!) 145/76 (!) 165/75 (!) 170/74  Pulse:  72 72 72  Resp:    15  Temp:      TempSrc:      SpO2:  97% 96% 95%  Weight: 143 lb (64.9 kg)     Height: 5' 0.5" (1.537 m)       GENERAL: The patient is a well-nourished female, in no acute distress. The vital signs are documented above. CARDIAC: There is a regular rate and rhythm.  VASCULAR: I do not detect carotid bruits. She has palpable femoral pulses.  She has palpable dorsalis pedis pulses bilaterally.  I cannot palpate tibial pulses. Both feet are warm and well-perfused. She has mild left lower extremity swelling. PULMONARY: There is good air exchange bilaterally without wheezing or rales. ABDOMEN: Soft and non-tender with normal pitched bowel sounds.  MUSCULOSKELETAL: There are no major deformities or cyanosis. NEUROLOGIC: No focal weakness or paresthesias are detected. SKIN: There are no ulcers or rashes noted. PSYCHIATRIC: The patient has a normal affect.  DATA:    LABS: GFR is greater than 60.  Creatinine is 0.69.  CT PELVIS WITHOUT CONTRAST: Patient has a soft tissue mass adjacent to the left common femoral artery that measures 3.8 cm in diameter.  It was felt that this could potentially be a pseudoaneurysm.  There is a fragment of the lesser trochanter that is adjacent to this mass.  DUPLEX LEFT GROIN: Patient had a duplex of the left groin today which showed a pseudoaneurysm of the left common femoral artery with an associated AV fistula.  MEDICAL ISSUES:   PSEUDOANEURYSM LEFT COMMON FEMORAL ARTERY WITH AN ASSOCIATED AV FISTULA: This patient has a pseudoaneurysm and AV fistula in the left groin.  She has had some leg swelling.  It is also concerning that there is a large bone spicule which is adjacent to this mass.  I think that  she will need exploration and repair of the artery and vein.  We will need to hold her Xarelto for 72 hours.  In addition, given her age and cardiac history I think she should have preoperative cardiac clearance.  CT scan was done without contrast in order to plan the operative approach I will order a CT angiogram of the pelvis.  Tentatively I will plan on repair of the pseudoaneurysm Tuesday next week.  [name] [facility] [phone]

## 2017-09-18 NOTE — ED Notes (Signed)
Patient taken to bathroom via wheelchair.

## 2017-09-18 NOTE — ED Provider Notes (Signed)
[facility] EMERGENCY DEPARTMENT Provider Note   CSN: [id number] Arrival date & time: 09/18/17  1411     History   Chief Complaint Chief Complaint  Patient presents with  . Groin Pain    HPI Sara Garcia is a 83 y.o. female.  HPI 56yoF with hx of Afib on xarelto, asthma, HTN, osteoporosis with recent hip fx s/p L hip replacement by Dr. Lyla Glassing on 8/29 who has had worsening groin pain for about 3 weeks.  She followed up in the ortho clinic and they ordered a CT of her pelvis to ensure that they did not miss a pelvic fracture.  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 Dr. Lyla Glassing spoke with Dr. Scot Dock with vascular about the findings and was instructed to send her here for further evaluation by vascular surgery.  She endorses mild left groin pain and left leg pain but otherwise denies headache, chest pain, shortness of breath, abdominal pain, nausea, vomiting, diarrhea.  No recent falls.  Past Medical History:  Diagnosis Date  . A-fib ([place])   . Arthritis   . Asthma   . DVT (deep venous thrombosis) ([place]) 2011/2012   on Eliquis  . Esophageal dysphagia    limited food and pill  . Esophageal reflux   . Essential hypertension, benign   . GERD (gastroesophageal reflux disease)   . Osteoporosis   . Unspecified prolapse of vaginal walls     Patient Active Problem List   Diagnosis Date Noted  . Pseudoaneurysm ([place]) 09/18/2017  . Acute CVA (cerebrovascular accident) ([place]) 07/23/2017  . Dysarthria 07/21/2017  . Tachycardia 07/21/2017  . Closed comminuted intertrochanteric fracture of left femur ([place]) 06/11/2017  . Diarrhea 01/03/2016  . IDA (iron deficiency anemia) 11/09/2015  . Dysphagia,  pharyngoesophageal phase   . Hiatal hernia   . Constipation 01/16/2015  . Esophageal dysphagia 06/27/2014  . GERD (gastroesophageal reflux disease) 06/27/2014  . Personal history of colonic polyps 06/27/2014  . Chronic anticoagulation 06/27/2014  . Primary osteoarthritis of right knee 05/09/2014  . DISH (diffuse idiopathic skeletal hyperostosis) 05/09/2014  . DDD (degenerative disc disease), lumbosacral 05/09/2014  . Prolapse of vaginal vault after hysterectomy 04/07/2014  . Atrial fibrillation ([place]) 01/10/2014  . Radiculopathy 01/10/2014  . Essential hypertension 12/21/2013    Past Surgical History:  Procedure Laterality Date  . ABDOMINAL HYSTERECTOMY    . COLONOSCOPY W/ BIOPSIES  02/01/2004   RMR: Anal papilla with normal rectum/Sigmoid diverticula/Small polyps in the cecum removed as described above. Inflammatory polyp  . ESOPHAGEAL DILATION     unknown year per patient.  . ESOPHAGEAL DILATION N/A 02/07/2015   RMR: Normal esophagus status post maloney dilation. Small hiatal hernia.   Marland Kitchen ESOPHAGOGASTRODUODENOSCOPY     unknown year per patient  . ESOPHAGOGASTRODUODENOSCOPY N/A 02/07/2015   RMR: Normal esophagus  status post  Maloney dilation. Small hiatal hernia  . FEMUR IM NAIL Left 06/11/2017   Procedure: INTRAMEDULLARY (IM) NAIL FEMORAL;  Surgeon: Rod Can, MD;  Location: [facility];  Service: Orthopedics;  Laterality: Left;  . NASAL SEPTUM SURGERY    . TONSILLECTOMY    . WRIST FRACTURE SURGERY      OB History    No data available       Home  Medications    Prior to Admission medications   Medication Sig Start Date End Date Taking? Authorizing Provider  acetaminophen (TYLENOL) 500 MG tablet Take 1,000 mg by mouth every 6 (six) hours.    Yes [provider]  Calcium Carb-Cholecalciferol (CALCIUM-VITAMIN D) 500-200 MG-UNIT tablet Take 1 tablet by mouth daily.   Yes [provider]  diltiazem (CARDIZEM) 60 MG tablet Take 1 tablet (60 mg total) 2 (two)  times daily by mouth. 08/19/17  Yes BranchAlphonse Guild, MD  DULoxetine (CYMBALTA) 60 MG capsule Take 60 mg by mouth 2 (two) times daily.    Yes [provider]  esomeprazole (NEXIUM) 20 MG capsule TAKE 1 CAPSULE BY MOUTH ONCE DAILY AT NOON 08/15/17  Yes Annitta Needs, NP  Fluticasone-Salmeterol (ADVAIR) 250-50 MCG/DOSE AEPB Inhale 1 puff into the lungs daily.    Yes [provider]  metoprolol tartrate (LOPRESSOR) 25 MG tablet Take 1.5 tablets (37.5 mg total) by mouth 2 (two) times daily. 02/24/17  Yes Branch, Alphonse Guild, MD  polyethylene glycol powder (GLYCOLAX/MIRALAX) powder MIX 17 GRAMS WITH LIQUID AND TAKE BY MOUTH ONCE DAILY Patient taking differently: MIX 17 GRAMS WITH LIQUID AND TAKE BY MOUTH DAILY AS NEEDED FOR CONSTIPATION. 12/13/16  Yes Mahala Menghini, PA-C  pyridoxine (B-6) 200 MG tablet Take 200 mg by mouth daily.   Yes [provider]  Rivaroxaban (XARELTO) 15 MG TABS tablet Take 1 tablet (15 mg total) by mouth daily with supper. 08/06/17 09/18/17 Yes BranchAlphonse Guild, MD  vitamin B-12 (CYANOCOBALAMIN) 1000 MCG tablet Take 1,000 mcg by mouth daily.   Yes [provider]    Family History Family History  Problem Relation Age of Onset  . Heart disease Mother   . Stroke Mother   . Emphysema Father   . Colon polyps Neg Hx   . Colon cancer Neg Hx     Social History Social History   Tobacco Use  . Smoking status: Never Smoker  . Smokeless tobacco: Never Used  Substance Use Topics  . Alcohol use: No    Alcohol/week: 0.0 oz  . Drug use: No     Allergies   Latex; Adhesive [tape]; Betadine [povidone iodine]; Ciprofloxacin; Codeine; Penicillins; Sulfa antibiotics; and Wellbutrin [bupropion]   Review of Systems Review of Systems  Constitutional: Negative for chills and fever.  HENT: Negative for ear pain and sore throat.   Eyes: Negative for pain and visual disturbance.  Respiratory: Negative for cough and shortness of breath.     Cardiovascular: Negative for chest pain and palpitations.  Gastrointestinal: Negative for abdominal pain and vomiting.  Genitourinary: Negative for dysuria, hematuria, pelvic pain, vaginal bleeding and vaginal discharge.  Musculoskeletal: Positive for arthralgias. Negative for back pain.       L groin pain   Skin: Negative for color change and rash.  Neurological: Negative for seizures and syncope.  All other systems reviewed and are negative.    Physical Exam Updated Vital Signs BP 139/66   Pulse 72   Temp 98.2 F (36.8 C) (Oral)   Resp 18   Ht 5' 0.5" (1.537 m)   Wt 64.9 kg (143 lb)   SpO2 94%   BMI 27.47 kg/m   Physical Exam  Constitutional: She is oriented to person, place, and time. She appears well-developed and well-nourished. No distress.  HENT:  Head: Normocephalic and atraumatic.  Eyes: Conjunctivae are normal. Pupils are equal, round, and reactive to light.  Neck: Neck supple.  Cardiovascular: Normal  rate. An irregularly irregular rhythm present.  No murmur heard. Pulses:      Dorsalis pedis pulses are 2+ on the right side, and 2+ on the left side.  Pulmonary/Chest: Effort normal and breath sounds normal. No respiratory distress.  Abdominal: Soft. There is no tenderness.  Musculoskeletal: She exhibits edema (1+ pitting edema noted to b/l ankles, slightly worse on left).       Left hip: She exhibits normal range of motion, no tenderness and no deformity.       Legs: Mild TTP along L superior anterior thigh along fem artery.  Neurological: She is alert and oriented to person, place, and time. She has normal strength. No sensory deficit.  Skin: Skin is warm and dry.  Psychiatric: She has a normal mood and affect.  Nursing note and vitals reviewed.    ED Treatments / Results  Labs (all labs ordered are listed, but only abnormal results are displayed) Labs Reviewed  COMPREHENSIVE METABOLIC PANEL - Abnormal; Notable for the following components:      Result  Value   Glucose, Bld 122 (*)    BUN 22 (*)    Albumin 3.3 (*)    ALT 12 (*)    All other components within normal limits  CBC WITH DIFFERENTIAL/PLATELET - Abnormal; Notable for the following components:   Hemoglobin 10.9 (*)    HCT 35.6 (*)    All other components within normal limits    EKG  EKG Interpretation None       Radiology Ct Pelvis Wo Contrast  Result Date: 09/17/2017 CLINICAL DATA:  Left hip pain and left groin pain since a fall 5 weeks ago. Proximal left femur fracture on 06/10/2017 treated with open reduction and internal fixation. EXAM: CT PELVIS WITHOUT CONTRAST TECHNIQUE: Multidetector CT imaging of the pelvis was performed following the standard protocol without intravenous contrast. COMPARISON:  Radiographs dated 06/11/2017 and 06/10/2017 FINDINGS: Musculoskeletal: There is a 3.8 cm soft tissue mass in the left inguinal region which appears to be intimately associated with the left femoral artery and which could represent a pseudoaneurysm of the femoral artery. There is a fragment of the lesser trochanter protruding into the soft tissue density. The previous proximal left femoral fracture has completely healed. Hardware appears in good position. Urinary Tract:  No abnormality visualized. Bowel:  Scattered diverticula in the sigmoid portion of the colon. Vascular/Lymphatic: 3.8 cm soft tissue mass in the left inguinal region which cannot be separated from the left common femoral artery. This could represent an old hematoma or lymphocele but the possibility of a pseudoaneurysm should be considered. No adenopathy. Reproductive: Uterus and ovaries appear to have been removed. Pessary in place. Other:  Tiny umbilical hernia containing only fat. IMPRESSION: 1. No acute fracture. 2. Proximal left femur fracture has healed. 3. New 3.8 cm soft tissue density in the left inguinal region which could represent a pseudoaneurysm of the left common femoral artery. There is a fragment of the  lesser trochanter of the proximal left femur in the adjacent soft tissues. 1 minutes into this fragment extends into the soft tissue density. Alternatively, this could represent a lymphocele or hematoma. Doppler ultrasound is recommended for further characterization. Critical Value/emergent results were called by telephone at the time of interpretation on 09/17/2017 at 4:38 pm to Dr. Rod Can , who verbally acknowledged these results. Electronically Signed   By: Lorriane Shire M.D.   On: 09/17/2017 16:40   US Arterial Lower Extremity Limited (not Including Abi's)  Result Date: 09/18/2017 CLINICAL DATA:  Left lower extremity pain. Abnormality seen in the left groin and on prior CT concerning for pseudoaneurysm or AV fistula. EXAM: LEFT LOWER EXTREMITY ARTERIAL DUPLEX SCAN TECHNIQUE: Gray-scale sonography as well as color Doppler and duplex ultrasound was performed to evaluate the lower extremity arteries including the common, superficial and profunda femoral arteries, popliteal artery and calf arteries. COMPARISON:  CT 09/17/2017 FINDINGS: There is a large vascular masslike area noted in the left groin region which appears to be fed by the left common femoral artery compatible with pseudoaneurysm. However, there also appears to be communication to the left common femoral vein with pulsatile flow within the common femoral vein above this area compatible with AVF fistula. 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 These results were called by telephone at the time of interpretation on 09/18/2017 at 12:26 pm to Dr. Rod Can , who verbally acknowledged these results. Electronically Signed   By: Rolm Baptise M.D.   On: 09/18/2017 12:37   Ct Angio Abd/pel W And/or Wo Contrast  Result Date: 09/18/2017 CLINICAL DATA:  Left groin pain after left-sided hip surgery 06/11/2017. EXAM: CTA ABDOMEN AND PELVIS WITHOUT AND WITH CONTRAST TECHNIQUE:  Multidetector CT imaging of the abdomen and pelvis was performed using the standard protocol during bolus administration of intravenous contrast. Multiplanar reconstructed images and MIPs were obtained and reviewed to evaluate the vascular anatomy. CONTRAST:  148mL ISOVUE-370 IOPAMIDOL (ISOVUE-370) INJECTION 76% COMPARISON:  Pelvic CT 09/17/2017, CT abdomen and pelvis 09/11/2015 FINDINGS: VASCULAR Aorta: Mild aortic atherosclerosis without aneurysm. No dissection or significant stenosis. Celiac: Patent without evidence of aneurysm, dissection, vasculitis or significant stenosis. SMA: Patent without evidence of aneurysm, dissection, vasculitis or significant stenosis. Renals: Single patent renal artery to the right kidney. Dual left renal arteries, the more cephalad is diminutive in caliber IMA: Patent without evidence of aneurysm, dissection, vasculitis or significant stenosis. Inflow: Patent without evidence of aneurysm, dissection, vasculitis or significant stenosis. Proximal Outflow: A 4.1 x 3.3 cm circumscribed soft tissue abnormality deviating the left common femoral and profunda arteries is noted in the left groin. Veins: On the venous phase, the left groin collection enhances homogeneously with displacement of the left common femoral and profunda veins. Review of the MIP images confirms the above findings. NON-VASCULAR Lower chest: Cardiomegaly without pericardial effusion. Chronic right middle lobe opacity with air bronchograms may reflect an area of chronic atelectasis. Similar finding in the left lower lobe, stable in appearance. Hepatobiliary: No focal liver abnormality is seen. No gallstones, gallbladder wall thickening, or biliary dilatation. Pancreas: Unremarkable. No pancreatic ductal dilatation or surrounding inflammatory changes. Spleen: Normal in size without focal abnormality. Adrenals/Urinary Tract: Adrenal glands are unremarkable. Kidneys are normal, without renal calculi, focal lesion, or  hydronephrosis. Bladder is unremarkable. Stomach/Bowel: Decompressed stomach. Normal small bowel rotation. Moderate stool burden with scattered colonic diverticulosis. No bowel obstruction or inflammation. Lymphatic: No lymphadenopathy. Reproductive: Pessary noted. Status post hysterectomy. No adnexal mass. Other: None Musculoskeletal: Left femoral neck fixation with intramedullary nail. Displaced ossific density anterior to the left hip joint as before consistent with a fragment of the lesser trochanter. This abuts the collection from a posterolateral approach. IMPRESSION: VASCULAR Minimally larger 4.1 x 3.3 cm left groin collection displacing the left common femoral artery and profunda. Thin wispy contrast is seen along the periphery of this collection and is suspicious for a posttraumatic extravasation into the pseudoaneurysm off the distal aspect of the common femoral artery,  just proximal to its bifurcation of the profunda. Given its intimate association with the adjacent left common femoral vein and profunda vein, a venous source of this collection is not entirely excluded. Ultrasound may help for further correlation. NON-VASCULAR 1. Cardiomegaly without pericardial effusion. 2. Chronic right middle and left lower lobe atelectasis accounting for chronic opacities within the lungs dating back to 2016. 3. No acute intraabdominal nor pelvic abnormality. Electronically Signed   By: Ashley Royalty M.D.   On: 09/18/2017 22:09    Procedures Procedures (including critical care time)  Medications Ordered in ED Medications  iopamidol (ISOVUE-370) 76 % injection (100 mLs  Contrast Given 09/18/17 2040)     Initial Impression / Assessment and Plan / ED Course  I have reviewed the triage vital signs and the nursing notes.  Pertinent labs & imaging results that were available during my care of the patient were reviewed by me and considered in my medical decision making (see chart for details).     77yoF with  above hx presenting with concern for L fem artery pseudoaneurysm from ortho clinic. AF and HDS. No focal neuro deficits. LLE NVI. Basic labs ordered. Pt discussed with Dr. Scot Dock with vascular surgery and he would like CT angio and admit to hospitalists for surgical clearance and management. Plans to do surgical repair on Tuesday.  Pt discussed with hospitalists and will be admitted to their service. Pt amenable with plan. Will hold xarelto per vascular recs.  Final Clinical Impressions(s) / ED Diagnoses   Final diagnoses:  Pseudoaneurysm of left femoral artery [facility])    ED Discharge Orders    None       Yoseline Andersson Mal[place], MD 09/18/17 2316    Gareth Morgan, MD 09/22/17 Cheral Marker, MD 10/02/17 2211

## 2017-09-18 NOTE — ED Triage Notes (Signed)
Pt sent by Dr Delfino Lovett due to an aneurysm in the groin area along with possible fracture seen on a CT scan. Pt had doppler US and CT yesterday. Pt has been having groin pain since having surgery for a left hip fx 06/11/17. VSS.

## 2017-09-19 ENCOUNTER — Other Ambulatory Visit: Payer: Self-pay

## 2017-09-19 ENCOUNTER — Encounter (HOSPITAL_COMMUNITY): Payer: Self-pay | Admitting: Student

## 2017-09-19 ENCOUNTER — Inpatient Hospital Stay (HOSPITAL_COMMUNITY): Payer: Medicare HMO

## 2017-09-19 DIAGNOSIS — I480 Paroxysmal atrial fibrillation: Secondary | ICD-10-CM

## 2017-09-19 DIAGNOSIS — Z01810 Encounter for preprocedural cardiovascular examination: Secondary | ICD-10-CM

## 2017-09-19 LAB — COMPREHENSIVE METABOLIC PANEL
ALT: 12 U/L — ABNORMAL LOW (ref 14–54)
AST: 23 U/L (ref 15–41)
Albumin: 3.2 g/dL — ABNORMAL LOW (ref 3.5–5.0)
Alkaline Phosphatase: 87 U/L (ref 38–126)
Anion gap: 9 (ref 5–15)
BUN: 18 mg/dL (ref 6–20)
CO2: 25 mmol/L (ref 22–32)
Calcium: 8.8 mg/dL — ABNORMAL LOW (ref 8.9–10.3)
Chloride: 105 mmol/L (ref 101–111)
Creatinine, Ser: 0.75 mg/dL (ref 0.44–1.00)
GFR calc Af Amer: >60 mL/min (ref >60)
GFR calc non Af Amer: >60 mL/min (ref >60)
Glucose, Bld: 142 mg/dL — ABNORMAL HIGH (ref 65–99)
Potassium: 3.2 mmol/L — ABNORMAL LOW (ref 3.5–5.1)
Sodium: 139 mmol/L (ref 135–145)
Total Bilirubin: 0.3 mg/dL (ref 0.3–1.2)
Total Protein: 6.8 g/dL (ref 6.5–8.1)

## 2017-09-19 LAB — CBC
HCT: 37.1 % (ref 36.0–46.0)
Hemoglobin: 11.6 g/dL — ABNORMAL LOW (ref 12.0–15.0)
MCH: 26.2 pg (ref 26.0–34.0)
MCHC: 31.3 g/dL (ref 30.0–36.0)
MCV: 83.7 fL (ref 78.0–100.0)
Platelets: 329 10*3/uL (ref 150–400)
RBC: 4.43 MIL/uL (ref 3.87–5.11)
RDW: 14.9 % (ref 11.5–15.5)
WBC: 10 10*3/uL (ref 4.0–10.5)

## 2017-09-19 LAB — HEPARIN LEVEL (UNFRACTIONATED)
Heparin Unfractionated: 0.28 IU/mL — ABNORMAL LOW (ref 0.30–0.70)
Heparin Unfractionated: 0.15 IU/mL — ABNORMAL LOW (ref 0.30–0.70)

## 2017-09-19 LAB — URINALYSIS, ROUTINE W REFLEX MICROSCOPIC
Bilirubin Urine: NEGATIVE
Glucose, UA: NEGATIVE mg/dL
Hgb urine dipstick: NEGATIVE
Ketones, ur: NEGATIVE mg/dL
Leukocytes, UA: NEGATIVE
Nitrite: NEGATIVE
Protein, ur: NEGATIVE mg/dL
Specific Gravity, Urine: 1.033 — ABNORMAL HIGH (ref 1.005–1.030)
pH: 8 (ref 5.0–8.0)

## 2017-09-19 LAB — APTT
aPTT: 33 seconds (ref 24–36)
aPTT: 43 seconds — ABNORMAL HIGH (ref 24–36)

## 2017-09-19 MED ORDER — METOPROLOL TARTRATE 25 MG PO TABS
37.5000 mg | ORAL_TABLET | Freq: Two times a day (BID) | ORAL | Status: DC
  Administered 2017-09-19 – 2017-09-21 (×7): 37.5 mg via ORAL
  Filled 2017-09-19 (×3): qty 1
  Filled 2017-09-19 (×2): qty 2
  Filled 2017-09-19 (×2): qty 1

## 2017-09-19 MED ORDER — HEPARIN (PORCINE) IN NACL 100-0.45 UNIT/ML-% IJ SOLN
1200.0000 [IU]/h | INTRAMUSCULAR | Status: DC
  Administered 2017-09-19: 700 [IU]/h via INTRAVENOUS
  Administered 2017-09-20 – 2017-09-21 (×2): 1100 [IU]/h via INTRAVENOUS
  Filled 2017-09-19 (×4): qty 250

## 2017-09-19 MED ORDER — VITAMIN B-6 100 MG PO TABS
200.0000 mg | ORAL_TABLET | Freq: Every day | ORAL | Status: DC
  Administered 2017-09-19 – 2017-09-25 (×7): 200 mg via ORAL
  Filled 2017-09-19 (×7): qty 2

## 2017-09-19 MED ORDER — POLYETHYLENE GLYCOL 3350 17 G PO PACK
17.0000 g | PACK | Freq: Every day | ORAL | Status: DC | PRN
  Administered 2017-09-23: 17 g via ORAL
  Filled 2017-09-19: qty 1

## 2017-09-19 MED ORDER — ACETAMINOPHEN 650 MG RE SUPP: 650.0000 mg | Freq: Four times a day (QID) | RECTAL | Status: DC | PRN

## 2017-09-19 MED ORDER — VITAMIN B-12 1000 MCG PO TABS
1000.0000 ug | ORAL_TABLET | Freq: Every day | ORAL | Status: DC
  Administered 2017-09-19 – 2017-09-25 (×7): 1000 ug via ORAL
  Filled 2017-09-19 (×7): qty 1

## 2017-09-19 MED ORDER — DULOXETINE HCL 60 MG PO CPEP
60.0000 mg | ORAL_CAPSULE | Freq: Two times a day (BID) | ORAL | Status: DC
  Administered 2017-09-19 – 2017-09-25 (×13): 60 mg via ORAL
  Filled 2017-09-19 (×13): qty 1

## 2017-09-19 MED ORDER — PANTOPRAZOLE SODIUM 40 MG PO TBEC
40.0000 mg | DELAYED_RELEASE_TABLET | Freq: Every day | ORAL | Status: DC
  Administered 2017-09-19 – 2017-09-25 (×7): 40 mg via ORAL
  Filled 2017-09-19 (×7): qty 1

## 2017-09-19 MED ORDER — ACETAMINOPHEN 325 MG PO TABS
650.0000 mg | ORAL_TABLET | Freq: Four times a day (QID) | ORAL | Status: DC | PRN
  Administered 2017-09-19 – 2017-09-23 (×5): 650 mg via ORAL
  Filled 2017-09-19 (×5): qty 2

## 2017-09-19 MED ORDER — POTASSIUM CHLORIDE 20 MEQ PO PACK
40.0000 meq | PACK | Freq: Once | ORAL | Status: AC
  Administered 2017-09-19: 40 meq via ORAL
  Filled 2017-09-19 (×2): qty 2

## 2017-09-19 MED ORDER — SODIUM CHLORIDE 0.9 % IV SOLN
INTRAVENOUS | Status: DC
  Administered 2017-09-19 (×2): via INTRAVENOUS

## 2017-09-19 MED ORDER — DILTIAZEM HCL 60 MG PO TABS
60.0000 mg | ORAL_TABLET | Freq: Two times a day (BID) | ORAL | Status: DC
  Administered 2017-09-19 – 2017-09-25 (×13): 60 mg via ORAL
  Filled 2017-09-19 (×14): qty 1

## 2017-09-19 MED ORDER — MOMETASONE FURO-FORMOTEROL FUM 200-5 MCG/ACT IN AERO
2.0000 | INHALATION_SPRAY | Freq: Two times a day (BID) | RESPIRATORY_TRACT | Status: DC
Start: 2017-09-19 — End: 2017-09-25
  Administered 2017-09-19 – 2017-09-25 (×9): 2 via RESPIRATORY_TRACT
  Filled 2017-09-19: qty 8.8

## 2017-09-19 NOTE — Progress Notes (Signed)
[place] for Heparin (Xarelto on hold) 'Indication: atrial fibrillation  Allergies  Allergen Reactions  . Latex Itching and Rash  . Adhesive [Tape] Other (See Comments)    Tears skin  . Betadine [Povidone Iodine]     blisters  . Ciprofloxacin Nausea And Vomiting  . Codeine Nausea And Vomiting    Upsets stomach, nightmares  . Penicillins Hives    Has patient had a PCN reaction causing immediate rash, facial/tongue/throat swelling, SOB or lightheadedness with hypotension: yes Has patient had a PCN reaction causing severe rash involving mucus membranes or skin necrosis: yes Has patient had a PCN reaction that required hospitalization: no Has patient had a PCN reaction occurring within the last 10 years: no If all of the above answers are "NO", then may proceed with Cephalosporin use.   . Sulfa Antibiotics Hives  . Wellbutrin [Bupropion]     Reaction unknown   Patient Measurements: Height: 5' 0.5" (153.7 cm) Weight: 143 lb (64.9 kg) IBW/kg (Calculated) : 46.65  Vital Signs: BP: 150/84 (12/07 0816) Pulse Rate: 85 (12/07 0945)  Labs: Recent Labs    09/18/17 1431 09/19/17 0314 09/19/17 0941  HGB 10.9* 11.6*  --   HCT 35.6* 37.1  --   PLT 350 329  --   APTT  --   --  33  HEPARINUNFRC  --   --  0.28*  CREATININE 0.69 0.75  --     Estimated Creatinine Clearance: 42.2 mL/min (by C-G formula based on SCr of 0.75 mg/dL).  Assessment: 83 y/o F on Xarelto PTA for afib here with left common femoral artery pseudoaneurysm. Holding Xarelto and started IV heparin in anticipation of vascular surgery. Initial heparin level and aPTT are low. No bleeding noted.   Goal of Therapy:  Heparin level 0.3-0.7 units/ml Monitor platelets by anticoagulation protocol: Yes   Plan:  Increase heparin drip to 900 units/hr Check an 8 hr heparin level and aPTT Daily CBC/HL  Sara Garcia, Rande Lawman 09/19/2017,11:07 AM

## 2017-09-19 NOTE — Consult Note (Signed)
Cardiology Consult    Patient ID: Sara Garcia; [id number]; 1928/07/30   Admit date: 09/18/2017 Date of Consult: 09/19/2017  Primary Care Provider: Doree Albee, MD Primary Cardiologist: Dr. Harl Bowie   Patient Profile    Sara Garcia is a 83 y.o. female with past medical history of PAF, HTN, recent CVA (occurring in 07/2017) and prior DVT who is being seen today for the evaluation of preoperative cardiac clearance at the request of Dr. Scot Dock.   History of Present Illness    Sara Garcia was recently evaluated by Dr. Harl Bowie in 07/2017 and denied any recent chest pain or dyspnea at that time. She had experienced worsening fatigue and palpitations with the recent titration of her Lopressor from 37.5mg  BID to 50mg  BID, therefore this was reduced back to 37.5mg  BID. To compensate for this reduction in her Lopressor dosing, short-acting Diltiazem was increased from 30mg  BID to 60mg  BID.   She has been followed by Dr. Lyla Glassing following left hip repair in 05/2017 (injury occurred after she rolled out of her bed) and has continued to report pain along her left hip. A CT of the Pelvis was obtained on 12/5 and showed a 3.8 cm soft tissue density in the left inguinal region which could represent a pseudoaneurysm of the left common femoral artery along with a large bone spicule. 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   She was therefore referred to the ED for further evaluation. Vascular Surgery was consulted and recommended exploration and repair of the artery and vein. Her Xarelto was held and she is being bridged with Heparin.   Initial labs show a WBC of 9.2, Hgb 10.9, platelets 350, Na+ 140, K+ 3.5, creatinine 0.69. CXR showing tortuosity and calcific atherosclerotic disease of the aorta with a persistent right middle lobe mass. EKG is pending. Echocardiogram from 07/2017 showed a preserved  EF of 55-60%, trivial MR, and mild TR.   In talking with the patient, she reports her activity has ben limited since her recent hip surgery. Ambulates with a walker and denies any anginal symptoms with this. She denies any recent chest pain or dyspnea on exertion. No recent orthopnea, PND, or lower extremity edema. Does have occasional palpitations.    Past Medical History:  Diagnosis Date  . Arthritis   . Asthma   . DVT (deep venous thrombosis) ([place]) 2011/2012  . Esophageal dysphagia    limited food and pill  . Esophageal reflux   . Essential hypertension, benign   . GERD (gastroesophageal reflux disease)   . Osteoporosis   . Paroxysmal atrial fibrillation ([facility])    a. on Xarelto for anticoagulation  . Unspecified prolapse of vaginal walls     Past Surgical History:  Procedure Laterality Date  . ABDOMINAL HYSTERECTOMY    . COLONOSCOPY W/ BIOPSIES  02/01/2004   RMR: Anal papilla with normal rectum/Sigmoid diverticula/Small polyps in the cecum removed as described above. Inflammatory polyp  . ESOPHAGEAL DILATION     unknown year per patient.  . ESOPHAGEAL DILATION N/A 02/07/2015   RMR: Normal esophagus status post maloney dilation. Small hiatal hernia.   Marland Kitchen ESOPHAGOGASTRODUODENOSCOPY     unknown year per patient  . ESOPHAGOGASTRODUODENOSCOPY N/A 02/07/2015   RMR: Normal esophagus  status post  Maloney dilation. Small hiatal hernia  . FEMUR IM NAIL Left 06/11/2017   Procedure: INTRAMEDULLARY (IM) NAIL FEMORAL;  Surgeon: Lyla Glassing,  Aaron Edelman, MD;  Location: [facility];  Service: Orthopedics;  Laterality: Left;  . NASAL SEPTUM SURGERY    . TONSILLECTOMY    . WRIST FRACTURE SURGERY       Home Medications:  Prior to Admission medications   Medication Sig Start Date End Date Taking? Authorizing Provider  acetaminophen (TYLENOL) 500 MG tablet Take 1,000 mg by mouth every 6 (six) hours.    Yes [provider]  Calcium Carb-Cholecalciferol (CALCIUM-VITAMIN D) 500-200 MG-UNIT tablet Take  1 tablet by mouth daily.   Yes [provider]  diltiazem (CARDIZEM) 60 MG tablet Take 1 tablet (60 mg total) 2 (two) times daily by mouth. 08/19/17  Yes BranchAlphonse Guild, MD  DULoxetine (CYMBALTA) 60 MG capsule Take 60 mg by mouth 2 (two) times daily.    Yes [provider]  esomeprazole (NEXIUM) 20 MG capsule TAKE 1 CAPSULE BY MOUTH ONCE DAILY AT NOON 08/15/17  Yes Annitta Needs, NP  Fluticasone-Salmeterol (ADVAIR) 250-50 MCG/DOSE AEPB Inhale 1 puff into the lungs daily.    Yes [provider]  metoprolol tartrate (LOPRESSOR) 25 MG tablet Take 1.5 tablets (37.5 mg total) by mouth 2 (two) times daily. 02/24/17  Yes Branch, Alphonse Guild, MD  polyethylene glycol powder (GLYCOLAX/MIRALAX) powder MIX 17 GRAMS WITH LIQUID AND TAKE BY MOUTH ONCE DAILY Patient taking differently: MIX 17 GRAMS WITH LIQUID AND TAKE BY MOUTH DAILY AS NEEDED FOR CONSTIPATION. 12/13/16  Yes Mahala Menghini, PA-C  pyridoxine (B-6) 200 MG tablet Take 200 mg by mouth daily.   Yes [provider]  Rivaroxaban (XARELTO) 15 MG TABS tablet Take 1 tablet (15 mg total) by mouth daily with supper. 08/06/17 09/18/17 Yes BranchAlphonse Guild, MD  vitamin B-12 (CYANOCOBALAMIN) 1000 MCG tablet Take 1,000 mcg by mouth daily.   Yes [provider]    Inpatient Medications: Scheduled Meds: . diltiazem  60 mg Oral BID  . DULoxetine  60 mg Oral BID  . metoprolol tartrate  37.5 mg Oral BID  . mometasone-formoterol  2 puff Inhalation BID  . pantoprazole  40 mg Oral Daily  . pyridoxine  200 mg Oral Daily  . vitamin B-12  1,000 mcg Oral Daily   Continuous Infusions: . sodium chloride 50 mL/hr at 09/19/17 0226  . heparin 700 Units/hr (09/19/17 0226)   PRN Meds: acetaminophen **OR** acetaminophen, hydrALAZINE, morphine injection, ondansetron (ZOFRAN) IV, polyethylene glycol  Allergies:    Allergies  Allergen Reactions  . Latex Itching and Rash  . Adhesive [Tape] Other (See Comments)    Tears skin    . Betadine [Povidone Iodine]     blisters  . Ciprofloxacin Nausea And Vomiting  . Codeine Nausea And Vomiting    Upsets stomach, nightmares  . Penicillins Hives    Has patient had a PCN reaction causing immediate rash, facial/tongue/throat swelling, SOB or lightheadedness with hypotension: yes Has patient had a PCN reaction causing severe rash involving mucus membranes or skin necrosis: yes Has patient had a PCN reaction that required hospitalization: no Has patient had a PCN reaction occurring within the last 10 years: no If all of the above answers are "NO", then may proceed with Cephalosporin use.   . Sulfa Antibiotics Hives  . Wellbutrin [Bupropion]     Reaction unknown    Social History:   Social History   Socioeconomic History  . Marital status: Married    Spouse name: Not on file  . Number of children: Not on file  . Years of  education: Not on file  . Highest education level: Not on file  Social Needs  . Financial resource strain: Not on file  . Food insecurity - worry: Not on file  . Food insecurity - inability: Not on file  . Transportation needs - medical: Not on file  . Transportation needs - non-medical: Not on file  Occupational History  . Not on file  Tobacco Use  . Smoking status: Never Smoker  . Smokeless tobacco: Never Used  Substance and Sexual Activity  . Alcohol use: No    Alcohol/week: 0.0 oz  . Drug use: No  . Sexual activity: Not Currently    Birth control/protection: None  Other Topics Concern  . Not on file  Social History Narrative  . Not on file     Family History:    Family History  Problem Relation Age of Onset  . Heart disease Mother   . Stroke Mother   . Emphysema Father   . Colon polyps Neg Hx   . Colon cancer Neg Hx       Review of Systems    General:  No chills, fever, night sweats or weight changes. Positive for left groin pain.   Cardiovascular:  No chest pain, dyspnea on exertion, edema, orthopnea, paroxysmal  nocturnal dyspnea. Positive for palpitations.  Dermatological: No rash, lesions/masses Respiratory: No cough, dyspnea Urologic: No hematuria, dysuria Abdominal:   No nausea, vomiting, diarrhea, bright red blood per rectum, melena, or hematemesis Neurologic:  No visual changes, wkns, changes in mental status.  All other systems reviewed and are otherwise negative except as noted above.  Physical Exam/Data    Vitals:   09/19/17 0600 09/19/17 0700 09/19/17 0816 09/19/17 0903  BP: (!) 151/80  (!) 150/84   Pulse: 74 81 (!) 120 (!) 108  Resp: (!) 23 (!) 22 (!) 28 (!) 29  Temp:      TempSrc:      SpO2: 94% 95% 96% 98%  Weight:      Height:       No intake or output data in the 24 hours ending 09/19/17 0939 Filed Weights   09/18/17 1419  Weight: 143 lb (64.9 kg)   Body mass index is 27.47 kg/m.   General: Pleasant, elderly Caucasian female appearing in NAD Psych: Normal affect. Neuro: Alert and oriented X 3. Moves all extremities spontaneously. HEENT: Normal  Neck: Supple without bruits or JVD. Lungs:  Resp regular and unlabored, without wheezing or rales CTA. Heart: RRR no s3, s4, or murmurs. Abdomen: Soft, non-tender, non-distended, BS + x 4.  Extremities: No clubbing, cyanosis or edema. DP/PT/Radials 2+ and equal bilaterally.   EKG:  The EKG was personally reviewed and demonstrates:  EKG from 07/22/2017 shows NSR, HR 85, with no acute ST or T-wave changes.   Telemetry:  Telemetry was personally reviewed and demonstrates: NSR, HR in 80's to low-100's with occasional PVC's.    Labs/Studies     Relevant CV Studies:  Echocardiogram: 07/22/2017 Study Conclusions  - Left ventricle: The cavity size was normal. There was mild focal   basal hypertrophy of the septum. Systolic function was normal.   The estimated ejection fraction was in the range of 55% to 60%.   Doppler parameters are consistent with both elevated ventricular   end-diastolic filling pressure and elevated  left atrial filling   pressure. - Mitral valve: Calcified annulus. Mildly thickened leaflets . - Right ventricle: The cavity size was mildly dilated. - Atrial septum: No defect or  patent foramen ovale was identified.   NST: 12/2013 Impression Exercise Capacity:  Lexiscan with no exercise. BP Response:  Normal blood pressure response. Clinical Symptoms:  Nausea ECG Impression:  No significant ST segment change suggestive of ischemia. Comparison with Prior Nuclear Study: No previous nuclear study performed  Overall Impression:  Normal stress nuclear study.  LV Ejection Fraction: 85%.  LV Wall Motion:  NL LV Function; NL Wall Motion  Laboratory Data:  Chemistry Recent Labs  Lab 09/18/17 1431 09/19/17 0314  NA 140 139  K 3.5 3.2*  CL 106 105  CO2 25 25  GLUCOSE 122* 142*  BUN 22* 18  CREATININE 0.69 0.75  CALCIUM 8.9 8.8*  GFRNONAA >60 >60  GFRAA >60 >60  ANIONGAP 9 9    Recent Labs  Lab 09/18/17 1431 09/19/17 0314  PROT 6.7 6.8  ALBUMIN 3.3* 3.2*  AST 22 23  ALT 12* 12*  ALKPHOS 86 87  BILITOT 0.6 0.3   Hematology Recent Labs  Lab 09/18/17 1431 09/19/17 0314  WBC 9.2 10.0  RBC 4.13 4.43  HGB 10.9* 11.6*  HCT 35.6* 37.1  MCV 86.2 83.7  MCH 26.4 26.2  MCHC 30.6 31.3  RDW 15.3 14.9  PLT 350 329   Cardiac EnzymesNo results for input(s): TROPONINI in the last 168 hours. No results for input(s): TROPIPOC in the last 168 hours.  BNPNo results for input(s): BNP, PROBNP in the last 168 hours.  DDimer No results for input(s): DDIMER in the last 168 hours.  Radiology/Studies:  Dg Chest 2 View  Result Date: 09/19/2017 CLINICAL DATA:  Preoperative radiograph. EXAM: CHEST  2 VIEW COMPARISON:  07/21/2017 FINDINGS: Cardiomediastinal silhouette is normal. Mediastinal contours appear intact. Calcific atherosclerotic disease and tortuosity of the aorta. There is no evidence of focal airspace consolidation, pleural effusion or pneumothorax. Right middle lobe mass  projects as infrahilar right hemithorax density. Osseous structures are without acute abnormality. Soft tissues are grossly normal. IMPRESSION: Tortuosity and calcific atherosclerotic disease of the aorta. Persistent right middle lobe mass. Electronically Signed   By: Fidela Salisbury M.D.   On: 09/19/2017 02:45   Ct Pelvis Wo Contrast  Result Date: 09/17/2017 CLINICAL DATA:  Left hip pain and left groin pain since a fall 5 weeks ago. Proximal left femur fracture on 06/10/2017 treated with open reduction and internal fixation. EXAM: CT PELVIS WITHOUT CONTRAST TECHNIQUE: Multidetector CT imaging of the pelvis was performed following the standard protocol without intravenous contrast. COMPARISON:  Radiographs dated 06/11/2017 and 06/10/2017 FINDINGS: Musculoskeletal: There is a 3.8 cm soft tissue mass in the left inguinal region which appears to be intimately associated with the left femoral artery and which could represent a pseudoaneurysm of the femoral artery. There is a fragment of the lesser trochanter protruding into the soft tissue density. The previous proximal left femoral fracture has completely healed. Hardware appears in good position. Urinary Tract:  No abnormality visualized. Bowel:  Scattered diverticula in the sigmoid portion of the colon. Vascular/Lymphatic: 3.8 cm soft tissue mass in the left inguinal region which cannot be separated from the left common femoral artery. This could represent an old hematoma or lymphocele but the possibility of a pseudoaneurysm should be considered. No adenopathy. Reproductive: Uterus and ovaries appear to have been removed. Pessary in place. Other:  Tiny umbilical hernia containing only fat. IMPRESSION: 1. No acute fracture. 2. Proximal left femur fracture has healed. 3. New 3.8 cm soft tissue density in the left inguinal region which could represent a pseudoaneurysm of  the left common femoral artery. There is a fragment of the lesser trochanter of the proximal  left femur in the adjacent soft tissues. 1 minutes into this fragment extends into the soft tissue density. Alternatively, this could represent a lymphocele or hematoma. Doppler ultrasound is recommended for further characterization. Critical Value/emergent results were called by telephone at the time of interpretation on 09/17/2017 at 4:38 pm to Dr. Rod Can , who verbally acknowledged these results. Electronically Signed   By: Lorriane Shire M.D.   On: 09/17/2017 16:40   US Arterial Lower Extremity Limited (not Including Abi's)  Result Date: 09/18/2017 CLINICAL DATA:  Left lower extremity pain. Abnormality seen in the left groin and on prior CT concerning for pseudoaneurysm or AV fistula. EXAM: LEFT LOWER EXTREMITY ARTERIAL DUPLEX SCAN TECHNIQUE: Gray-scale sonography as well as color Doppler and duplex ultrasound was performed to evaluate the lower extremity arteries including the common, superficial and profunda femoral arteries, popliteal artery and calf arteries. COMPARISON:  CT 09/17/2017 FINDINGS: There is a large vascular masslike area noted in the left groin region which appears to be fed by the left common femoral artery compatible with pseudoaneurysm. However, there also appears to be communication to the left common femoral vein with pulsatile flow within the common femoral vein above this area compatible with AVF fistula. 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 These results were called by telephone at the time of interpretation on 09/18/2017 at 12:26 pm to Dr. Rod Can , who verbally acknowledged these results. Electronically Signed   By: Rolm Baptise M.D.   On: 09/18/2017 12:37   Ct Angio Abd/pel W And/or Wo Contrast  Result Date: 09/18/2017 CLINICAL DATA:  Left groin pain after left-sided hip surgery 06/11/2017. EXAM: CTA ABDOMEN AND PELVIS WITHOUT AND WITH CONTRAST TECHNIQUE: Multidetector CT imaging of the abdomen and  pelvis was performed using the standard protocol during bolus administration of intravenous contrast. Multiplanar reconstructed images and MIPs were obtained and reviewed to evaluate the vascular anatomy. CONTRAST:  172mL ISOVUE-370 IOPAMIDOL (ISOVUE-370) INJECTION 76% COMPARISON:  Pelvic CT 09/17/2017, CT abdomen and pelvis 09/11/2015 FINDINGS: VASCULAR Aorta: Mild aortic atherosclerosis without aneurysm. No dissection or significant stenosis. Celiac: Patent without evidence of aneurysm, dissection, vasculitis or significant stenosis. SMA: Patent without evidence of aneurysm, dissection, vasculitis or significant stenosis. Renals: Single patent renal artery to the right kidney. Dual left renal arteries, the more cephalad is diminutive in caliber IMA: Patent without evidence of aneurysm, dissection, vasculitis or significant stenosis. Inflow: Patent without evidence of aneurysm, dissection, vasculitis or significant stenosis. Proximal Outflow: A 4.1 x 3.3 cm circumscribed soft tissue abnormality deviating the left common femoral and profunda arteries is noted in the left groin. Veins: On the venous phase, the left groin collection enhances homogeneously with displacement of the left common femoral and profunda veins. Review of the MIP images confirms the above findings. NON-VASCULAR Lower chest: Cardiomegaly without pericardial effusion. Chronic right middle lobe opacity with air bronchograms may reflect an area of chronic atelectasis. Similar finding in the left lower lobe, stable in appearance. Hepatobiliary: No focal liver abnormality is seen. No gallstones, gallbladder wall thickening, or biliary dilatation. Pancreas: Unremarkable. No pancreatic ductal dilatation or surrounding inflammatory changes. Spleen: Normal in size without focal abnormality. Adrenals/Urinary Tract: Adrenal glands are unremarkable. Kidneys are normal, without renal calculi, focal lesion, or hydronephrosis. Bladder is unremarkable.  Stomach/Bowel: Decompressed stomach. Normal small bowel rotation. Moderate stool burden with scattered colonic diverticulosis. No  bowel obstruction or inflammation. Lymphatic: No lymphadenopathy. Reproductive: Pessary noted. Status post hysterectomy. No adnexal mass. Other: None Musculoskeletal: Left femoral neck fixation with intramedullary nail. Displaced ossific density anterior to the left hip joint as before consistent with a fragment of the lesser trochanter. This abuts the collection from a posterolateral approach. IMPRESSION: VASCULAR Minimally larger 4.1 x 3.3 cm left groin collection displacing the left common femoral artery and profunda. Thin wispy contrast is seen along the periphery of this collection and is suspicious for a posttraumatic extravasation into the pseudoaneurysm off the distal aspect of the common femoral artery, just proximal to its bifurcation of the profunda. Given its intimate association with the adjacent left common femoral vein and profunda vein, a venous source of this collection is not entirely excluded. Ultrasound may help for further correlation. NON-VASCULAR 1. Cardiomegaly without pericardial effusion. 2. Chronic right middle and left lower lobe atelectasis accounting for chronic opacities within the lungs dating back to 2016. 3. No acute intraabdominal nor pelvic abnormality. Electronically Signed   By: Ashley Royalty M.D.   On: 09/18/2017 22:09     Assessment & Plan     1. 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 Recent CT showed a 3.8 cm soft tissue density in the left inguinal region which could represent a pseudoaneurysm of the left common femoral artery along with a large bone spicule. [place] confirmed a left common femoral artery pseudoaneurysm and AV fistula to the left common  femoral vein and Vascular Surgery is planning for exploration and repair next Tuesday. Xarelto has been held and she is being bridged with Heparin.  - she has no history of known CAD. Last ischemic evaluation was a NST in 2015 which was low risk. Recent echo in 07/2017 showed a preserved EF of 55-60%, trivial MR, and mild TR. She denies any recent anginal symptoms but her activity has been limited at home over the past few months due to her hip pain. Would recommend obtaining a 12-lead EKG. With her recent echo showing a preserved EF and no WMA, would not pursue further ischemic evaluation prior to surgery. She is of acceptable risk from a cardiac perspective to proceed. Will discuss further with Dr. Stanford Breed. Monitor on telemetry in the post-operative setting as she is at higher risk for having atrial fibrillation with RVR.   2. Paroxysmal Atrial Fibrillation - She is currently in NSR with HR in the 80's to low-100's. She remains on Lopressor 37.5mg  BID and Cardizem 60mg  BID for rate-control.  - This patients CHA2DS2-VASc Score and unadjusted Ischemic Stroke Rate (% per year) is equal to 11.2 % stroke rate/year from a score of 7 (HTN, Aortic Plaque, Female, Age (2), CVA (2)). Xarelto is held in anticipation of her upcoming surgery. She is being bridged with Heparin in the setting of her elevated CHA2DS2-VASc score, recent CVA, and history of DVT.   3. HTN - BP has been variable at 133/66 - 177/90 within the past 24 hours.  - continue to follow and adjust PTA medications as needed.   4. Recent CVA - occurred in 07/2017, thought to be Embolic in etiology. Was switched from Eliquis to Xarelto by Neurology.    For questions or updates, please contact [place] Please consult [email or web address] for contact info under Cardiology/STEMI.  Signed, Erma Heritage, PA-C 09/19/2017,  9:39 AM Pager: [phone]   As above, patient seen and examined.  Briefly she is an 83 year old female with past  medical history of paroxysmal atrial fibrillation, hypertension, recent CVA, prior DVT, status post recent repair of hip fracture for preoperative evaluation prior to repair of pseudoaneurysm.  Nuclear study 2015 showed ejection fraction 85% and normal perfusion.  Last echocardiogram October 2018 showed normal LV function and mild left atrial enlargement.  Patient had repair of left hip fracture August 2018.  She continued to have some pain and evaluation has revealed pseudoaneurysm of the left common femoral artery and AV fistula.  This will require surgical repair and cardiology asked to evaluate preoperatively.  Patient was fairly active prior to her hip fracture in August.  She denies dyspnea on exertion, orthopnea, PND, pedal edema or exertional chest pain.  She also tolerated that surgery well.  Electrocardiogram July 22, 2017 showed sinus rhythm with no ST changes.    Given patient's age she will be at increased risk for surgical intervention.  However she had a nuclear study in 2015 that was negative and has not had chest pain.  LV function was preserved on most recent echocardiogram and she tolerated hip fracture repair well.  I do not think further cardiac testing is indicated prior to surgery.  Given history of paroxysmal atrial fibrillation and prior CVA I agree with continuing heparin preoperatively.  Would resume postoperatively when hemostasis achieved and ultimately resume Xarelto.  Continue beta-blocker and Cardizem.  Kirk Ruths, MD

## 2017-09-19 NOTE — Progress Notes (Signed)
LUCIA HARM is a 83 y.o. female patient admitted from ED awake, alert - oriented  X 4 - no acute distress noted.  VSS - Blood pressure 122/66, pulse 69, temperature 98.1 F (36.7 C), temperature source Oral, resp. rate 20, height 5\' 2"  (1.575 m), weight 64.7 kg (142 lb 11.2 oz), SpO2 95 %.    IV in place, occlusive dsg intact without redness.  Orientation to room, and floor completed with information packet given to patient/family.  Patient declined safety video at this time.  Admission INP armband ID verified with patient/family, and in place.   SR up x 2, fall assessment complete, with patient and family able to verbalize understanding of risk associated with falls, and verbalized understanding to call nsg before up out of bed.  Call light within reach, patient able to voice, and demonstrate understanding.  Skin, clean-dry- intact without evidence of bruising, or skin tears.   No evidence of skin break down noted on exam.     Will cont to eval and treat per MD orders.  Richardean Chimera, RN 09/19/2017 3:35 PM

## 2017-09-19 NOTE — ED Notes (Signed)
Pt returned from xray

## 2017-09-19 NOTE — ED Notes (Signed)
Heart Healthy diet breakfast tray ordered @ 0445.  

## 2017-09-19 NOTE — ED Notes (Signed)
Patient transported to X-ray 

## 2017-09-19 NOTE — Progress Notes (Signed)
   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  This is based on the duplex.  There is a spicule of bone that is adjacent to this.  I have recommended exploration and repair of the pseudoaneurysm.  This is scheduled for Tuesday.  The patient has been cleared by cardiology.  Her Xarelto is on hold and she is getting a heparin bridge.  I have discussed the case with Dr.Swinteck.  Given that this bone has been here for 5 weeks or so, there may be significant scarring.  My goal would be to remove the bone that was adjacent to the artery and vein.  It may not be possible to remove the entire fragment.  Certainly she is at slightly increased risk because of her age.  However I do not think there are any other options short of exploration and repair.   SUBJECTIVE:   No specific complaints.  PHYSICAL EXAM:   Vitals:   09/19/17 0945 09/19/17 1100 09/19/17 1130 09/19/17 1400  BP:      Pulse: 85 (!) 109 (!) 104 67  Resp: (!) 22 20  (!) 21  Temp:      TempSrc:      SpO2: 100% 94% 98% 97%  Weight:      Height:       No swelling in the left groin.  LABS:   Lab Results  Component Value Date   WBC 10.0 09/19/2017   HGB 11.6 (L) 09/19/2017   HCT 37.1 09/19/2017   MCV 83.7 09/19/2017   PLT 329 09/19/2017   Lab Results  Component Value Date   CREATININE 0.75 09/19/2017   Lab Results  Component Value Date   INR 1.21 07/21/2017   CT ANGIOGRAM PELVIS: I reviewed her CT angiogram of the pelvis.  It appears that I should be able to get proximal and distal control to the aneurysm although proximal control may be fairly high at the level of the inguinal ligament.  PROBLEM LIST:    Principal Problem:   Pseudoaneurysm ([place]) Active Problems:   Atrial fibrillation (HCC)   CURRENT MEDS:   . diltiazem  60 mg Oral BID  . DULoxetine  60 mg Oral BID  . metoprolol tartrate  37.5 mg Oral  BID  . mometasone-formoterol  2 puff Inhalation BID  . pantoprazole  40 mg Oral Daily  . pyridoxine  200 mg Oral Daily  . vitamin B-12  1,000 mcg Oral Daily    [name] Beeper: [phone] Office: [phone] 09/19/2017

## 2017-09-19 NOTE — Progress Notes (Signed)
[place] for Heparin (Xarelto on hold) 'Indication: atrial fibrillation  Allergies  Allergen Reactions  . Latex Itching and Rash  . Adhesive [Tape] Other (See Comments)    Tears skin  . Betadine [Povidone Iodine]     blisters  . Ciprofloxacin Nausea And Vomiting  . Codeine Nausea And Vomiting    Upsets stomach, nightmares  . Penicillins Hives    Has patient had a PCN reaction causing immediate rash, facial/tongue/throat swelling, SOB or lightheadedness with hypotension: yes Has patient had a PCN reaction causing severe rash involving mucus membranes or skin necrosis: yes Has patient had a PCN reaction that required hospitalization: no Has patient had a PCN reaction occurring within the last 10 years: no If all of the above answers are "NO", then may proceed with Cephalosporin use.   . Sulfa Antibiotics Hives  . Wellbutrin [Bupropion]     Reaction unknown   Patient Measurements: Height: 5\' 2"  (157.5 cm) Weight: 142 lb 11.2 oz (64.7 kg) IBW/kg (Calculated) : 50.1  Heparin dosing weight: 63 kg  Vital Signs: Temp: 98.1 F (36.7 C) (12/07 1523) Temp Source: Oral (12/07 1523) BP: 122/66 (12/07 1523) Pulse Rate: 69 (12/07 1523)  Labs: Recent Labs    09/18/17 1431 09/19/17 0314 09/19/17 0941 09/19/17 1941  HGB 10.9* 11.6*  --   --   HCT 35.6* 37.1  --   --   PLT 350 329  --   --   APTT  --   --  33 43*  HEPARINUNFRC  --   --  0.28* 0.15*  CREATININE 0.69 0.75  --   --     Estimated Creatinine Clearance: 43.7 mL/min (by C-G formula based on SCr of 0.75 mg/dL).  Assessment: 83 y/o F on Xarelto PTA (unsure if was taking) for afib here with left common femoral artery pseudoaneurysm. Holding Xarelto and started IV heparin in anticipation of vascular surgery. No baseline labs. Initial heparin level and aPTT are low.   Heparin level is 0.15 (decreased) and aPTT is up to 43. CBC stable.   Per RN, antecubital IV site is little red and  swollen, but no bleeding noted. IV team to put in new. Heparin still running for now.   Will use heparin levels going forward as appears be correlating and no influence from Xarelto. No bleeding reported.   Goal of Therapy:  Heparin level 0.3-0.7 units/ml aPTT 66-102 seconds Monitor platelets by anticoagulation protocol: Yes   Plan:  Increase heparin drip to 1100 units/hr Check an 8 hr heparin level - ok with AM labs.  Daily CBC/HL Instructed RN to call us if any bleeding or heparin has to be stopped for line issues.   Sloan Leiter, PharmD, BCPS, BCCCP Clinical Pharmacist Clinical phone 09/19/2017 until 11PM [phone] After hours, please call #[phone] 09/19/2017,8:47 PM

## 2017-09-19 NOTE — Progress Notes (Signed)
PROGRESS NOTE    Sara Garcia  VPX:[id number] DOB: Mar 01, 1928 DOA: 09/18/2017 PCP: Doree Albee, MD     Brief Narrative:  Sara Garcia is a 83 yo female with past medical history of hypertension, paroxysmal A. fib, CVA 07/2017, DVT, GERD, asthma who fell out of bed on August 2018 and suffered a left-sided hip fracture.  She underwent repair by Dr. Lyla Glassing at that time.  Since surgical intervention, she continued to have pain but not at the surgical site but more in her left groin.  This prompted a CT scan of the pelvis which revealed a pseudoaneurysm.  Orthopedic surgery consulted vascular surgery for intervention.  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 No further cardiac testing indicated prior to surgery   Paroxysmal A Fib -CHA2DS2-VASc 7 -Continue metoprolol, cardizem -Continue heparin gtt, resume Xarelto post-operatively   Recent CVA -In 07/2017. Switched from Eliquis to Xarelto  Left hip fracture s/p repair on 8/29 -Followed by Dr. Lyla Glassing   GERD -Continue PPI  Hypokalemia -Replace, trend   Chronic right middle and left lower lobe atelectasis  -Chronic opacities within the lungs dating back to 2016  Depression -Continue cymbalta    DVT prophylaxis: Heparin gtt Code Status: DNR Family Communication: No family at bedside Disposition Plan: Pending surgery 12/11    Consultants:   Cardiology  Vascular surgery  Procedures:   None  Antimicrobials:  Anti-infectives (From admission, onward)   None        Subjective: Patient complains of left groin pain.  Denies any chest pain or shortness of breath, no nausea or vomiting.  Objective: Vitals:   09/19/17 0903 09/19/17 0945 09/19/17 1100 09/19/17 1130  BP:        Pulse: (!) 108 85 (!) 109 (!) 104  Resp: (!) 29 (!) 22 20   Temp:      TempSrc:      SpO2: 98% 100% 94% 98%  Weight:      Height:       No intake or output data in the 24 hours ending 09/19/17 1332 Filed Weights   09/18/17 1419  Weight: 64.9 kg (143 lb)    Examination:  General exam: Appears calm and comfortable  Respiratory system: Clear to auscultation. Respiratory effort normal. Cardiovascular system: S1 & S2 heard, Regular rhythm, tachycardic. No JVD, murmurs, rubs, gallops or clicks. No pedal edema. Gastrointestinal system: Abdomen is nondistended, soft and nontender. No organomegaly or masses felt. Normal bowel sounds heard. Central nervous system: Alert and oriented. No focal neurological deficits. Extremities: Symmetric 5 x 5 power. Weaker on LLE due to pain in groin  Skin: No rashes, lesions or ulcers Psychiatry: Judgement and insight appear normal. Mood & affect appropriate.   Data Reviewed: I have personally reviewed following labs and imaging studies  CBC: Recent Labs  Lab 09/18/17 1431 09/19/17 0314  WBC 9.2 10.0  NEUTROABS 4.9  --   HGB 10.9* 11.6*  HCT 35.6* 37.1  MCV 86.2 83.7  PLT 350 462   Basic Metabolic Panel: Recent Labs  Lab 09/18/17 1431 09/19/17 0314  NA 140 139  K 3.5 3.2*  CL 106 105  CO2 25 25  GLUCOSE 122* 142*  BUN 22* 18  CREATININE 0.69 0.75  CALCIUM 8.9 8.8*   GFR: Estimated Creatinine  Clearance: 42.2 mL/min (by C-G formula based on SCr of 0.75 mg/dL). Liver Function Tests: Recent Labs  Lab 09/18/17 1431 09/19/17 0314  AST 22 23  ALT 12* 12*  ALKPHOS 86 87  BILITOT 0.6 0.3  PROT 6.7 6.8  ALBUMIN 3.3* 3.2*   No results for input(s): LIPASE, AMYLASE in the last 168 hours. No results for input(s): AMMONIA in the last 168 hours. Coagulation Profile: No results for input(s): INR, PROTIME in the last 168 hours. Cardiac Enzymes: No results for input(s): CKTOTAL, CKMB, CKMBINDEX, TROPONINI in the last 168 hours. BNP  (last 3 results) No results for input(s): PROBNP in the last 8760 hours. HbA1C: No results for input(s): HGBA1C in the last 72 hours. CBG: No results for input(s): GLUCAP in the last 168 hours. Lipid Profile: No results for input(s): CHOL, HDL, LDLCALC, TRIG, CHOLHDL, LDLDIRECT in the last 72 hours. Thyroid Function Tests: No results for input(s): TSH, T4TOTAL, FREET4, T3FREE, THYROIDAB in the last 72 hours. Anemia Panel: No results for input(s): VITAMINB12, FOLATE, FERRITIN, TIBC, IRON, RETICCTPCT in the last 72 hours. Sepsis Labs: No results for input(s): PROCALCITON, LATICACIDVEN in the last 168 hours.  No results found for this or any previous visit (from the past 240 hour(s)).     Radiology Studies: Dg Chest 2 View  Result Date: 09/19/2017 CLINICAL DATA:  Preoperative radiograph. EXAM: CHEST  2 VIEW COMPARISON:  07/21/2017 FINDINGS: Cardiomediastinal silhouette is normal. Mediastinal contours appear intact. Calcific atherosclerotic disease and tortuosity of the aorta. There is no evidence of focal airspace consolidation, pleural effusion or pneumothorax. Right middle lobe mass projects as infrahilar right hemithorax density. Osseous structures are without acute abnormality. Soft tissues are grossly normal. IMPRESSION: Tortuosity and calcific atherosclerotic disease of the aorta. Persistent right middle lobe mass. Electronically Signed   By: Fidela Salisbury M.D.   On: 09/19/2017 02:45   US Arterial Lower Extremity Limited (not Including Abi's)  Result Date: 09/18/2017 CLINICAL DATA:  Left lower extremity pain. Abnormality seen in the left groin and on prior CT concerning for pseudoaneurysm or AV fistula. EXAM: LEFT LOWER EXTREMITY ARTERIAL DUPLEX SCAN TECHNIQUE: Gray-scale sonography as well as color Doppler and duplex ultrasound was performed to evaluate the lower extremity arteries including the common, superficial and profunda femoral arteries, popliteal artery and calf arteries.  COMPARISON:  CT 09/17/2017 FINDINGS: There is a large vascular masslike area noted in the left groin region which appears to be fed by the left common femoral artery compatible with pseudoaneurysm. However, there also appears to be communication to the left common femoral vein with pulsatile flow within the common femoral vein above this area compatible with AVF fistula. 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 These results were called by telephone at the time of interpretation on 09/18/2017 at 12:26 pm to Dr. Rod Can , who verbally acknowledged these results. Electronically Signed   By: Rolm Baptise M.D.   On: 09/18/2017 12:37   Ct Angio Abd/pel W And/or Wo Contrast  Result Date: 09/18/2017 CLINICAL DATA:  Left groin pain after left-sided hip surgery 06/11/2017. EXAM: CTA ABDOMEN AND PELVIS WITHOUT AND WITH CONTRAST TECHNIQUE: Multidetector CT imaging of the abdomen and pelvis was performed using the standard protocol during bolus administration of intravenous contrast. Multiplanar reconstructed images and MIPs were obtained and reviewed to evaluate the vascular anatomy. CONTRAST:  120mL ISOVUE-370 IOPAMIDOL (ISOVUE-370) INJECTION 76% COMPARISON:  Pelvic CT 09/17/2017, CT abdomen and pelvis 09/11/2015 FINDINGS: VASCULAR Aorta:  Mild aortic atherosclerosis without aneurysm. No dissection or significant stenosis. Celiac: Patent without evidence of aneurysm, dissection, vasculitis or significant stenosis. SMA: Patent without evidence of aneurysm, dissection, vasculitis or significant stenosis. Renals: Single patent renal artery to the right kidney. Dual left renal arteries, the more cephalad is diminutive in caliber IMA: Patent without evidence of aneurysm, dissection, vasculitis or significant stenosis. Inflow: Patent without evidence of aneurysm, dissection, vasculitis or significant stenosis. Proximal Outflow: A 4.1 x 3.3 cm circumscribed soft  tissue abnormality deviating the left common femoral and profunda arteries is noted in the left groin. Veins: On the venous phase, the left groin collection enhances homogeneously with displacement of the left common femoral and profunda veins. Review of the MIP images confirms the above findings. NON-VASCULAR Lower chest: Cardiomegaly without pericardial effusion. Chronic right middle lobe opacity with air bronchograms may reflect an area of chronic atelectasis. Similar finding in the left lower lobe, stable in appearance. Hepatobiliary: No focal liver abnormality is seen. No gallstones, gallbladder wall thickening, or biliary dilatation. Pancreas: Unremarkable. No pancreatic ductal dilatation or surrounding inflammatory changes. Spleen: Normal in size without focal abnormality. Adrenals/Urinary Tract: Adrenal glands are unremarkable. Kidneys are normal, without renal calculi, focal lesion, or hydronephrosis. Bladder is unremarkable. Stomach/Bowel: Decompressed stomach. Normal small bowel rotation. Moderate stool burden with scattered colonic diverticulosis. No bowel obstruction or inflammation. Lymphatic: No lymphadenopathy. Reproductive: Pessary noted. Status post hysterectomy. No adnexal mass. Other: None Musculoskeletal: Left femoral neck fixation with intramedullary nail. Displaced ossific density anterior to the left hip joint as before consistent with a fragment of the lesser trochanter. This abuts the collection from a posterolateral approach. IMPRESSION: VASCULAR Minimally larger 4.1 x 3.3 cm left groin collection displacing the left common femoral artery and profunda. Thin wispy contrast is seen along the periphery of this collection and is suspicious for a posttraumatic extravasation into the pseudoaneurysm off the distal aspect of the common femoral artery, just proximal to its bifurcation of the profunda. Given its intimate association with the adjacent left common femoral vein and profunda vein, a  venous source of this collection is not entirely excluded. Ultrasound may help for further correlation. NON-VASCULAR 1. Cardiomegaly without pericardial effusion. 2. Chronic right middle and left lower lobe atelectasis accounting for chronic opacities within the lungs dating back to 2016. 3. No acute intraabdominal nor pelvic abnormality. Electronically Signed   By: Ashley Royalty M.D.   On: 09/18/2017 22:09      Scheduled Meds: . diltiazem  60 mg Oral BID  . DULoxetine  60 mg Oral BID  . metoprolol tartrate  37.5 mg Oral BID  . mometasone-formoterol  2 puff Inhalation BID  . pantoprazole  40 mg Oral Daily  . pyridoxine  200 mg Oral Daily  . vitamin B-12  1,000 mcg Oral Daily   Continuous Infusions: . sodium chloride 50 mL/hr at 09/19/17 1257  . heparin 900 Units/hr (09/19/17 1110)     LOS: 1 day    Time spent: 40 minutes   Dessa Phi, DO [facility] [email or web address] Password [email or web address] 09/19/2017, 1:32 PM

## 2017-09-19 NOTE — Progress Notes (Signed)
ANTICOAGULATION CONSULT NOTE - Initial Consult  Pharmacy Consult for Heparin (Xarelto on hold) 'Indication: atrial fibrillation  Allergies  Allergen Reactions  . Latex Itching and Rash  . Adhesive [Tape] Other (See Comments)    Tears skin  . Betadine [Povidone Iodine]     blisters  . Ciprofloxacin Nausea And Vomiting  . Codeine Nausea And Vomiting    Upsets stomach, nightmares  . Penicillins Hives    Has patient had a PCN reaction causing immediate rash, facial/tongue/throat swelling, SOB or lightheadedness with hypotension: yes Has patient had a PCN reaction causing severe rash involving mucus membranes or skin necrosis: yes Has patient had a PCN reaction that required hospitalization: no Has patient had a PCN reaction occurring within the last 10 years: no If all of the above answers are "NO", then may proceed with Cephalosporin use.   . Sulfa Antibiotics Hives  . Wellbutrin [Bupropion]     Reaction unknown   Patient Measurements: Height: 5' 0.5" (153.7 cm) Weight: 143 lb (64.9 kg) IBW/kg (Calculated) : 46.65  Vital Signs: Temp: 98.2 F (36.8 C) (12/06 1418) Temp Source: Oral (12/06 1418) BP: 174/77 (12/07 0000) Pulse Rate: 109 (12/07 0000)  Labs: Recent Labs    09/18/17 1431  HGB 10.9*  HCT 35.6*  PLT 350  CREATININE 0.69    Estimated Creatinine Clearance: 42.2 mL/min (by C-G formula based on SCr of 0.69 mg/dL).  Medical History: Past Medical History:  Diagnosis Date  . A-fib ([place])   . Arthritis   . Asthma   . DVT (deep venous thrombosis) ([place]) 2011/2012   on Eliquis  . Esophageal dysphagia    limited food and pill  . Esophageal reflux   . Essential hypertension, benign   . GERD (gastroesophageal reflux disease)   . Osteoporosis   . Unspecified prolapse of vaginal walls    Assessment: 83 y/o F on Xarelto PTA for afib here with left common femoral artery pseudoaneurysm. Holding Xarelto and starting heparin in anticipation of vascular surgery. Last  dose Xarelto >24 hours ago, ok to start heparin now. Will likely need to use the aPTT to dose heparin for the next several days given Xarelto influence on anti-Xa levels.   Goal of Therapy:  Heparin level 0.3-0.7 units/ml Monitor platelets by anticoagulation protocol: Yes   Plan:  Start heparin drip at 700 units/hr 1000 HL/aPTT Daily CBC/HL/aPTT Monitor for bleeding   Narda Bonds 09/19/2017,1:36 AM

## 2017-09-20 DIAGNOSIS — I729 Aneurysm of unspecified site: Secondary | ICD-10-CM

## 2017-09-20 LAB — BASIC METABOLIC PANEL
Anion gap: 9 (ref 5–15)
BUN: 16 mg/dL (ref 6–20)
CO2: 22 mmol/L (ref 22–32)
Calcium: 8.5 mg/dL — ABNORMAL LOW (ref 8.9–10.3)
Chloride: 107 mmol/L (ref 101–111)
Creatinine, Ser: 0.7 mg/dL (ref 0.44–1.00)
GFR calc Af Amer: >60 mL/min (ref >60)
GFR calc non Af Amer: >60 mL/min (ref >60)
Glucose, Bld: 108 mg/dL — ABNORMAL HIGH (ref 65–99)
Potassium: 3.7 mmol/L (ref 3.5–5.1)
Sodium: 138 mmol/L (ref 135–145)

## 2017-09-20 LAB — CBC
HCT: 34.9 % — ABNORMAL LOW (ref 36.0–46.0)
Hemoglobin: 11 g/dL — ABNORMAL LOW (ref 12.0–15.0)
MCH: 26.2 pg (ref 26.0–34.0)
MCHC: 31.5 g/dL (ref 30.0–36.0)
MCV: 83.1 fL (ref 78.0–100.0)
Platelets: 305 10*3/uL (ref 150–400)
RBC: 4.2 MIL/uL (ref 3.87–5.11)
RDW: 14.9 % (ref 11.5–15.5)
WBC: 10.1 10*3/uL (ref 4.0–10.5)

## 2017-09-20 LAB — HEPARIN LEVEL (UNFRACTIONATED)
Heparin Unfractionated: 0.48 IU/mL (ref 0.30–0.70)
Heparin Unfractionated: 0.48 IU/mL (ref 0.30–0.70)

## 2017-09-20 NOTE — Progress Notes (Signed)
[place] for Heparin (Xarelto on hold) 'Indication: atrial fibrillation  Allergies  Allergen Reactions  . Latex Itching and Rash  . Adhesive [Tape] Other (See Comments)    Tears skin  . Betadine [Povidone Iodine]     blisters  . Ciprofloxacin Nausea And Vomiting  . Codeine Nausea And Vomiting    Upsets stomach, nightmares  . Penicillins Hives    Has patient had a PCN reaction causing immediate rash, facial/tongue/throat swelling, SOB or lightheadedness with hypotension: yes Has patient had a PCN reaction causing severe rash involving mucus membranes or skin necrosis: yes Has patient had a PCN reaction that required hospitalization: no Has patient had a PCN reaction occurring within the last 10 years: no If all of the above answers are "NO", then may proceed with Cephalosporin use.   . Sulfa Antibiotics Hives  . Wellbutrin [Bupropion]     Reaction unknown   Patient Measurements: Height: 5\' 2"  (157.5 cm) Weight: 147 lb 7.8 oz (66.9 kg) IBW/kg (Calculated) : 50.1  Heparin dosing weight: 63 kg  Vital Signs: Temp: 98.2 F (36.8 C) (12/08 0507) Temp Source: Oral (12/08 0507) BP: 144/71 (12/08 0507) Pulse Rate: 66 (12/08 0507)  Labs: Recent Labs    09/18/17 1431 09/19/17 0314  09/19/17 0941 09/19/17 1941 09/20/17 0320 09/20/17 1120  HGB 10.9* 11.6*  --   --   --  11.0*  --   HCT 35.6* 37.1  --   --   --  34.9*  --   PLT 350 329  --   --   --  305  --   APTT  --   --   --  33 43*  --   --   HEPARINUNFRC  --   --    < > 0.28* 0.15* 0.48 0.48  CREATININE 0.69 0.75  --   --   --  0.70  --    < > = values in this interval not displayed.    Estimated Creatinine Clearance: 44.4 mL/min (by C-G formula based on SCr of 0.7 mg/dL).  Assessment: 83 y/o F on Xarelto PTA (unsure if was taking) for afib here with left common femoral artery pseudoaneurysm. Holding Xarelto and started IV heparin in anticipation of vascular surgery.    Heparin level is therapeutic x 2 this AM after rate increase. CBC stable, no bleeding reported.   Goal of Therapy:  Heparin level 0.3-0.7 units/mL Monitor platelets by anticoagulation protocol: Yes   Plan:  Cont heparin drip at 1100 units/hr Monitor daily HL and CBC  Leroy Libman, PharmD Pharmacy Resident Pager: [phone]

## 2017-09-20 NOTE — Progress Notes (Signed)
[place] for Heparin (Xarelto on hold) 'Indication: atrial fibrillation  Allergies  Allergen Reactions  . Latex Itching and Rash  . Adhesive [Tape] Other (See Comments)    Tears skin  . Betadine [Povidone Iodine]     blisters  . Ciprofloxacin Nausea And Vomiting  . Codeine Nausea And Vomiting    Upsets stomach, nightmares  . Penicillins Hives    Has patient had a PCN reaction causing immediate rash, facial/tongue/throat swelling, SOB or lightheadedness with hypotension: yes Has patient had a PCN reaction causing severe rash involving mucus membranes or skin necrosis: yes Has patient had a PCN reaction that required hospitalization: no Has patient had a PCN reaction occurring within the last 10 years: no If all of the above answers are "NO", then may proceed with Cephalosporin use.   . Sulfa Antibiotics Hives  . Wellbutrin [Bupropion]     Reaction unknown   Patient Measurements: Height: 5\' 2"  (157.5 cm) Weight: 142 lb 11.2 oz (64.7 kg) IBW/kg (Calculated) : 50.1  Heparin dosing weight: 63 kg  Vital Signs: Temp: 98.5 F (36.9 C) (12/07 2121) Temp Source: Oral (12/07 2121) BP: 141/86 (12/07 2121) Pulse Rate: 119 (12/07 2121)  Labs: Recent Labs    09/18/17 1431 09/19/17 0314 09/19/17 0941 09/19/17 1941 09/20/17 0320  HGB 10.9* 11.6*  --   --  11.0*  HCT 35.6* 37.1  --   --  34.9*  PLT 350 329  --   --  305  APTT  --   --  33 43*  --   HEPARINUNFRC  --   --  0.28* 0.15* 0.48  CREATININE 0.69 0.75  --   --  0.70    Estimated Creatinine Clearance: 43.7 mL/min (by C-G formula based on SCr of 0.7 mg/dL).  Assessment: 83 y/o F on Xarelto PTA (unsure if was taking) for afib here with left common femoral artery pseudoaneurysm. Holding Xarelto and started IV heparin in anticipation of vascular surgery.   Heparin level is therapeutic x 1 this AM after rate increase  Goal of Therapy:  Heparin level 0.3-0.7 units/mL Monitor  platelets by anticoagulation protocol: Yes   Plan:  Cont heparin drip at 1100 units/hr 1200 HL  Narda Bonds, PharmD, BCPS Clinical Pharmacist Phone: [phone]

## 2017-09-20 NOTE — Progress Notes (Signed)
PROGRESS NOTE    Sara Garcia  FUX:[id number] DOB: 1928/01/01 DOA: 09/18/2017 PCP: Doree Albee, MD     Brief Narrative:  Sara Garcia is a 84 yo female with past medical history of hypertension, paroxysmal A. fib, CVA 07/2017, DVT, GERD, asthma who fell out of bed on August 2018 and suffered a left-sided hip fracture.  She underwent repair by Dr. Lyla Glassing at that time.  Since surgical intervention, she continued to have pain but not at the surgical site but more in her left groin.  This prompted a CT scan of the pelvis which revealed a pseudoaneurysm.  Orthopedic surgery consulted vascular surgery for intervention.  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 No further cardiac testing indicated prior to surgery   Paroxysmal A Fib -CHA2DS2-VASc 7 -Continue metoprolol, cardizem -Continue heparin gtt, resume Xarelto post-operatively   Recent CVA -In 07/2017. Switched from Eliquis to Xarelto  Left hip fracture s/p repair on 8/29 -Followed by Dr. Lyla Glassing   GERD -Continue PPI  Hypokalemia -Replace, trend   Chronic right middle and left lower lobe atelectasis  -Chronic opacities within the lungs dating back to 2016  Depression -Continue cymbalta    DVT prophylaxis: Heparin gtt Code Status: DNR Family Communication: No family at bedside Disposition Plan: Pending surgery 12/11    Consultants:   Cardiology  Vascular surgery  Procedures:   None  Antimicrobials:  Anti-infectives (From admission, onward)   None       Subjective: Patient complains of left groin pain.  Denies any chest pain or shortness of breath, no nausea or vomiting. Doing well overall.   Objective: Vitals:   09/19/17 2121 09/20/17 0500 09/20/17 0507  09/20/17 1300  BP: (!) 141/86  (!) 144/71 (!) 141/55  Pulse: (!) 119  66 97  Resp: 18  17 (!) 22  Temp: 98.5 F (36.9 C)  98.2 F (36.8 C) (!) 97.4 F (36.3 C)  TempSrc: Oral  Oral Oral  SpO2: 97%  95% 97%  Weight:  66.9 kg (147 lb 7.8 oz)    Height:        Intake/Output Summary (Last 24 hours) at 09/20/2017 1510 Last data filed at 09/20/2017 1000 Gross per 24 hour  Intake 426.57 ml  Output 700 ml  Net -273.43 ml   Filed Weights   09/18/17 1419 09/19/17 1516 09/20/17 0500  Weight: 64.9 kg (143 lb) 64.7 kg (142 lb 11.2 oz) 66.9 kg (147 lb 7.8 oz)    Examination:  General exam: Appears calm and comfortable  Respiratory system: Clear to auscultation. Respiratory effort normal. Cardiovascular system: S1 & S2 heard, Regular rhythm, regular rate. No JVD, murmurs, rubs, gallops or clicks. No pedal edema. Gastrointestinal system: Abdomen is nondistended, soft and nontender. No organomegaly or masses felt. Normal bowel sounds heard. Central nervous system: Alert and oriented. No focal neurological deficits. Extremities: Symmetric  Skin: No rashes, lesions or ulcers Psychiatry: Judgement and insight appear normal. Mood & affect appropriate.   Data Reviewed: I have personally reviewed following labs and imaging studies  CBC: Recent Labs  Lab 09/18/17 1431 09/19/17 0314 09/20/17 0320  WBC 9.2 10.0 10.1  NEUTROABS 4.9  --   --   HGB 10.9* 11.6* 11.0*  HCT 35.6* 37.1 34.9*  MCV 86.2 83.7 83.1  PLT  350 329 026   Basic Metabolic Panel: Recent Labs  Lab 09/18/17 1431 09/19/17 0314 09/20/17 0320  NA 140 139 138  K 3.5 3.2* 3.7  CL 106 105 107  CO2 25 25 22   GLUCOSE 122* 142* 108*  BUN 22* 18 16  CREATININE 0.69 0.75 0.70  CALCIUM 8.9 8.8* 8.5*   GFR: Estimated Creatinine Clearance: 44.4 mL/min (by C-G formula based on SCr of 0.7 mg/dL). Liver Function Tests: Recent Labs  Lab 09/18/17 1431 09/19/17 0314  AST 22 23  ALT 12* 12*  ALKPHOS 86 87  BILITOT 0.6 0.3    PROT 6.7 6.8  ALBUMIN 3.3* 3.2*   No results for input(s): LIPASE, AMYLASE in the last 168 hours. No results for input(s): AMMONIA in the last 168 hours. Coagulation Profile: No results for input(s): INR, PROTIME in the last 168 hours. Cardiac Enzymes: No results for input(s): CKTOTAL, CKMB, CKMBINDEX, TROPONINI in the last 168 hours. BNP (last 3 results) No results for input(s): PROBNP in the last 8760 hours. HbA1C: No results for input(s): HGBA1C in the last 72 hours. CBG: No results for input(s): GLUCAP in the last 168 hours. Lipid Profile: No results for input(s): CHOL, HDL, LDLCALC, TRIG, CHOLHDL, LDLDIRECT in the last 72 hours. Thyroid Function Tests: No results for input(s): TSH, T4TOTAL, FREET4, T3FREE, THYROIDAB in the last 72 hours. Anemia Panel: No results for input(s): VITAMINB12, FOLATE, FERRITIN, TIBC, IRON, RETICCTPCT in the last 72 hours. Sepsis Labs: No results for input(s): PROCALCITON, LATICACIDVEN in the last 168 hours.  No results found for this or any previous visit (from the past 240 hour(s)).     Radiology Studies: Dg Chest 2 View  Result Date: 09/19/2017 CLINICAL DATA:  Preoperative radiograph. EXAM: CHEST  2 VIEW COMPARISON:  07/21/2017 FINDINGS: Cardiomediastinal silhouette is normal. Mediastinal contours appear intact. Calcific atherosclerotic disease and tortuosity of the aorta. There is no evidence of focal airspace consolidation, pleural effusion or pneumothorax. Right middle lobe mass projects as infrahilar right hemithorax density. Osseous structures are without acute abnormality. Soft tissues are grossly normal. IMPRESSION: Tortuosity and calcific atherosclerotic disease of the aorta. Persistent right middle lobe mass. Electronically Signed   By: Fidela Salisbury M.D.   On: 09/19/2017 02:45   Ct Angio Abd/pel W And/or Wo Contrast  Result Date: 09/18/2017 CLINICAL DATA:  Left groin pain after left-sided hip surgery 06/11/2017. EXAM: CTA ABDOMEN  AND PELVIS WITHOUT AND WITH CONTRAST TECHNIQUE: Multidetector CT imaging of the abdomen and pelvis was performed using the standard protocol during bolus administration of intravenous contrast. Multiplanar reconstructed images and MIPs were obtained and reviewed to evaluate the vascular anatomy. CONTRAST:  126mL ISOVUE-370 IOPAMIDOL (ISOVUE-370) INJECTION 76% COMPARISON:  Pelvic CT 09/17/2017, CT abdomen and pelvis 09/11/2015 FINDINGS: VASCULAR Aorta: Mild aortic atherosclerosis without aneurysm. No dissection or significant stenosis. Celiac: Patent without evidence of aneurysm, dissection, vasculitis or significant stenosis. SMA: Patent without evidence of aneurysm, dissection, vasculitis or significant stenosis. Renals: Single patent renal artery to the right kidney. Dual left renal arteries, the more cephalad is diminutive in caliber IMA: Patent without evidence of aneurysm, dissection, vasculitis or significant stenosis. Inflow: Patent without evidence of aneurysm, dissection, vasculitis or significant stenosis. Proximal Outflow: A 4.1 x 3.3 cm circumscribed soft tissue abnormality deviating the left common femoral and profunda arteries is noted in the left groin. Veins: On the venous phase, the left groin collection enhances homogeneously with displacement of the left common femoral and profunda veins. Review of the MIP images confirms the  above findings. NON-VASCULAR Lower chest: Cardiomegaly without pericardial effusion. Chronic right middle lobe opacity with air bronchograms may reflect an area of chronic atelectasis. Similar finding in the left lower lobe, stable in appearance. Hepatobiliary: No focal liver abnormality is seen. No gallstones, gallbladder wall thickening, or biliary dilatation. Pancreas: Unremarkable. No pancreatic ductal dilatation or surrounding inflammatory changes. Spleen: Normal in size without focal abnormality. Adrenals/Urinary Tract: Adrenal glands are unremarkable. Kidneys are  normal, without renal calculi, focal lesion, or hydronephrosis. Bladder is unremarkable. Stomach/Bowel: Decompressed stomach. Normal small bowel rotation. Moderate stool burden with scattered colonic diverticulosis. No bowel obstruction or inflammation. Lymphatic: No lymphadenopathy. Reproductive: Pessary noted. Status post hysterectomy. No adnexal mass. Other: None Musculoskeletal: Left femoral neck fixation with intramedullary nail. Displaced ossific density anterior to the left hip joint as before consistent with a fragment of the lesser trochanter. This abuts the collection from a posterolateral approach. IMPRESSION: VASCULAR Minimally larger 4.1 x 3.3 cm left groin collection displacing the left common femoral artery and profunda. Thin wispy contrast is seen along the periphery of this collection and is suspicious for a posttraumatic extravasation into the pseudoaneurysm off the distal aspect of the common femoral artery, just proximal to its bifurcation of the profunda. Given its intimate association with the adjacent left common femoral vein and profunda vein, a venous source of this collection is not entirely excluded. Ultrasound may help for further correlation. NON-VASCULAR 1. Cardiomegaly without pericardial effusion. 2. Chronic right middle and left lower lobe atelectasis accounting for chronic opacities within the lungs dating back to 2016. 3. No acute intraabdominal nor pelvic abnormality. Electronically Signed   By: Ashley Royalty M.D.   On: 09/18/2017 22:09      Scheduled Meds: . diltiazem  60 mg Oral BID  . DULoxetine  60 mg Oral BID  . metoprolol tartrate  37.5 mg Oral BID  . mometasone-formoterol  2 puff Inhalation BID  . pantoprazole  40 mg Oral Daily  . pyridoxine  200 mg Oral Daily  . vitamin B-12  1,000 mcg Oral Daily   Continuous Infusions: . heparin 1,100 Units/hr (09/20/17 0716)     LOS: 2 days    Time spent: 20 minutes   Dessa Phi, DO [facility] [email or web address] Password [email or web address] 09/20/2017, 3:10 PM

## 2017-09-21 LAB — HEPARIN LEVEL (UNFRACTIONATED): Heparin Unfractionated: 0.57 IU/mL (ref 0.30–0.70)

## 2017-09-21 LAB — BASIC METABOLIC PANEL
Anion gap: 9 (ref 5–15)
BUN: 16 mg/dL (ref 6–20)
CO2: 23 mmol/L (ref 22–32)
Calcium: 8.5 mg/dL — ABNORMAL LOW (ref 8.9–10.3)
Chloride: 104 mmol/L (ref 101–111)
Creatinine, Ser: 0.67 mg/dL (ref 0.44–1.00)
GFR calc Af Amer: >60 mL/min (ref >60)
GFR calc non Af Amer: >60 mL/min (ref >60)
Glucose, Bld: 107 mg/dL — ABNORMAL HIGH (ref 65–99)
Potassium: 4.1 mmol/L (ref 3.5–5.1)
Sodium: 136 mmol/L (ref 135–145)

## 2017-09-21 LAB — CBC
HCT: 35.6 % — ABNORMAL LOW (ref 36.0–46.0)
Hemoglobin: 11 g/dL — ABNORMAL LOW (ref 12.0–15.0)
MCH: 25.8 pg — ABNORMAL LOW (ref 26.0–34.0)
MCHC: 30.9 g/dL (ref 30.0–36.0)
MCV: 83.6 fL (ref 78.0–100.0)
Platelets: 290 10*3/uL (ref 150–400)
RBC: 4.26 MIL/uL (ref 3.87–5.11)
RDW: 15.1 % (ref 11.5–15.5)
WBC: 10.3 10*3/uL (ref 4.0–10.5)

## 2017-09-21 NOTE — Progress Notes (Signed)
[place] for Heparin (Xarelto on hold) 'Indication: atrial fibrillation  Allergies  Allergen Reactions  . Latex Itching and Rash  . Adhesive [Tape] Other (See Comments)    Tears skin  . Betadine [Povidone Iodine]     blisters  . Ciprofloxacin Nausea And Vomiting  . Codeine Nausea And Vomiting    Upsets stomach, nightmares  . Penicillins Hives    Has patient had a PCN reaction causing immediate rash, facial/tongue/throat swelling, SOB or lightheadedness with hypotension: yes Has patient had a PCN reaction causing severe rash involving mucus membranes or skin necrosis: yes Has patient had a PCN reaction that required hospitalization: no Has patient had a PCN reaction occurring within the last 10 years: no If all of the above answers are "NO", then may proceed with Cephalosporin use.   . Sulfa Antibiotics Hives  . Wellbutrin [Bupropion]     Reaction unknown   Patient Measurements: Height: 5\' 2"  (157.5 cm) Weight: 147 lb 4.3 oz (66.8 kg) IBW/kg (Calculated) : 50.1  Heparin dosing weight: 63 kg  Vital Signs: Temp: 98.3 F (36.8 C) (12/09 0614) Temp Source: Oral (12/09 0614) BP: 117/79 (12/09 0614) Pulse Rate: 108 (12/09 0614)  Labs: Recent Labs    09/19/17 0314  09/19/17 0941 09/19/17 1941 09/20/17 0320 09/20/17 1120 09/21/17 0310  HGB 11.6*  --   --   --  11.0*  --  11.0*  HCT 37.1  --   --   --  34.9*  --  35.6*  PLT 329  --   --   --  305  --  290  APTT  --   --  33 43*  --   --   --   HEPARINUNFRC  --    < > 0.28* 0.15* 0.48 0.48 0.57  CREATININE 0.75  --   --   --  0.70  --  0.67   < > = values in this interval not displayed.    Estimated Creatinine Clearance: 44.4 mL/min (by C-G formula based on SCr of 0.67 mg/dL).  Assessment: 82 y/o F on Xarelto PTA (unsure if was taking) for afib here with left common femoral artery pseudoaneurysm. Holding Xarelto and started IV heparin in anticipation of vascular surgery.    Heparin level is therapeutic this AM. CBC stable, no bleeding reported.   Goal of Therapy:  Heparin level 0.3-0.7 units/mL Monitor platelets by anticoagulation protocol: Yes   Plan:  Cont heparin drip at 1100 units/hr Monitor daily HL and CBC  Leroy Libman, PharmD Pharmacy Resident Pager: [phone]

## 2017-09-21 NOTE — Progress Notes (Signed)
[facility]  Subjective  - feels ok   Objective 117/79 (!) 108 98.3 F (36.8 C) (Oral) 18 92%  Intake/Output Summary (Last 24 hours) at 09/21/2017 0753 Last data filed at 09/20/2017 1800 Gross per 24 hour  Intake 612 ml  Output -  Net 612 ml   Left foot well perfused  Assessment/Planning: Pseudo aneurysm repair [place] Tuesday  [facility] 09/21/2017 7:53 AM --  Laboratory Lab Results: Recent Labs    09/20/17 0320 09/21/17 0310  WBC 10.1 10.3  HGB 11.0* 11.0*  HCT 34.9* 35.6*  PLT 305 290   BMET Recent Labs    09/20/17 0320 09/21/17 0310  NA 138 136  K 3.7 4.1  CL 107 104  CO2 22 23  GLUCOSE 108* 107*  BUN 16 16  CREATININE 0.70 0.67  CALCIUM 8.5* 8.5*    COAG Lab Results  Component Value Date   INR 1.21 07/21/2017   INR 1.27 06/10/2017   INR 1.97 (H) 07/19/2014   No results found for: PTT

## 2017-09-21 NOTE — Progress Notes (Signed)
PROGRESS NOTE    Sara Garcia  [id number]:[id number] DOB: 27-Oct-1927 DOA: 09/18/2017 PCP: Doree Albee, MD     Brief Narrative:  Sara Garcia is a 83 yo female with past medical history of hypertension, paroxysmal A. fib, CVA 07/2017, DVT, GERD, asthma who fell out of bed on August 2018 and suffered a left-sided hip fracture.  She underwent repair by Dr. Lyla Glassing at that time.  Since surgical intervention, she continued to have pain but not at the surgical site but more in her left groin.  This prompted a CT scan of the pelvis which revealed a pseudoaneurysm.  Orthopedic surgery consulted vascular surgery for intervention.  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 No further cardiac testing indicated prior to surgery   Paroxysmal A Fib -CHA2DS2-VASc 7 -Continue metoprolol, cardizem -Continue heparin gtt, resume Xarelto post-operatively   Recent CVA -In 07/2017. Switched from Eliquis to Xarelto  Left hip fracture s/p repair on 8/29 -Followed by Dr. Lyla Glassing   GERD -Continue PPI  Chronic right middle and left lower lobe atelectasis  -Chronic opacities within the lungs dating back to 2016  Depression -Continue cymbalta    DVT prophylaxis: Heparin gtt Code Status: DNR Family Communication: No family at bedside Disposition Plan: Pending surgery 12/11    Consultants:   Cardiology  Vascular surgery  Procedures:   None  Antimicrobials:  Anti-infectives (From admission, onward)   None       Subjective: No complaints, states groin pain is better controlled   Objective: Vitals:   09/20/17 2211 09/20/17 2304 09/21/17 0500 09/21/17 0614  BP: 138/82 135/66  117/79  Pulse: (!) 124 86  (!) 108  Resp: 18   18  Temp: 98.4 F (36.9 C)    98.3 F (36.8 C)  TempSrc: Oral   Oral  SpO2: 95%   92%  Weight:   66.8 kg (147 lb 4.3 oz)   Height:        Intake/Output Summary (Last 24 hours) at 09/21/2017 1246 Last data filed at 09/21/2017 1240 Gross per 24 hour  Intake 940 ml  Output 300 ml  Net 640 ml   Filed Weights   09/19/17 1516 09/20/17 0500 09/21/17 0500  Weight: 64.7 kg (142 lb 11.2 oz) 66.9 kg (147 lb 7.8 oz) 66.8 kg (147 lb 4.3 oz)    Examination:  General exam: Appears calm and comfortable  Respiratory system: Clear to auscultation. Respiratory effort normal. Cardiovascular system: S1 & S2 heard, Tachycardic rate, regular rhythm. No JVD, murmurs, rubs, gallops or clicks. No pedal edema. Gastrointestinal system: Abdomen is nondistended, soft and nontender. No organomegaly or masses felt. Normal bowel sounds heard. Central nervous system: Alert and oriented. No focal neurological deficits. Extremities: Symmetric  Skin: No rashes, lesions or ulcers Psychiatry: Judgement and insight appear normal. Mood & affect appropriate.   Data Reviewed: I have personally reviewed following labs and imaging studies  CBC: Recent Labs  Lab 09/18/17 1431 09/19/17 0314 09/20/17 0320 09/21/17 0310  WBC 9.2 10.0 10.1 10.3  NEUTROABS 4.9  --   --   --   HGB 10.9* 11.6* 11.0* 11.0*  HCT 35.6* 37.1 34.9* 35.6*  MCV 86.2 83.7 83.1 83.6  PLT 350 329 305 629   Basic Metabolic Panel: Recent Labs  Lab 09/18/17 1431  09/19/17 0314 09/20/17 0320 09/21/17 0310  NA 140 139 138 136  K 3.5 3.2* 3.7 4.1  CL 106 105 107 104  CO2 25 25 22 23   GLUCOSE 122* 142* 108* 107*  BUN 22* 18 16 16   CREATININE 0.69 0.75 0.70 0.67  CALCIUM 8.9 8.8* 8.5* 8.5*   GFR: Estimated Creatinine Clearance: 44.4 mL/min (by C-G formula based on SCr of 0.67 mg/dL). Liver Function Tests: Recent Labs  Lab 09/18/17 1431 09/19/17 0314  AST 22 23  ALT 12* 12*  ALKPHOS 86 87  BILITOT 0.6 0.3  PROT 6.7 6.8  ALBUMIN 3.3* 3.2*   No results for  input(s): LIPASE, AMYLASE in the last 168 hours. No results for input(s): AMMONIA in the last 168 hours. Coagulation Profile: No results for input(s): INR, PROTIME in the last 168 hours. Cardiac Enzymes: No results for input(s): CKTOTAL, CKMB, CKMBINDEX, TROPONINI in the last 168 hours. BNP (last 3 results) No results for input(s): PROBNP in the last 8760 hours. HbA1C: No results for input(s): HGBA1C in the last 72 hours. CBG: No results for input(s): GLUCAP in the last 168 hours. Lipid Profile: No results for input(s): CHOL, HDL, LDLCALC, TRIG, CHOLHDL, LDLDIRECT in the last 72 hours. Thyroid Function Tests: No results for input(s): TSH, T4TOTAL, FREET4, T3FREE, THYROIDAB in the last 72 hours. Anemia Panel: No results for input(s): VITAMINB12, FOLATE, FERRITIN, TIBC, IRON, RETICCTPCT in the last 72 hours. Sepsis Labs: No results for input(s): PROCALCITON, LATICACIDVEN in the last 168 hours.  No results found for this or any previous visit (from the past 240 hour(s)).     Radiology Studies: No results found.    Scheduled Meds: . diltiazem  60 mg Oral BID  . DULoxetine  60 mg Oral BID  . metoprolol tartrate  37.5 mg Oral BID  . mometasone-formoterol  2 puff Inhalation BID  . pantoprazole  40 mg Oral Daily  . pyridoxine  200 mg Oral Daily  . vitamin B-12  1,000 mcg Oral Daily   Continuous Infusions: . heparin 1,100 Units/hr (09/21/17 0641)     LOS: 3 days    Time spent: 20 minutes   Dessa Phi, DO [facility] [email or web address] Password [email or web address] 09/21/2017, 12:46 PM

## 2017-09-21 NOTE — Plan of Care (Signed)
Patient preparing for surgery on 09/23/2017. P.J. Linus Mako, RN

## 2017-09-22 LAB — BASIC METABOLIC PANEL
Anion gap: 9 (ref 5–15)
BUN: 15 mg/dL (ref 6–20)
CO2: 25 mmol/L (ref 22–32)
Calcium: 8.6 mg/dL — ABNORMAL LOW (ref 8.9–10.3)
Chloride: 103 mmol/L (ref 101–111)
Creatinine, Ser: 0.76 mg/dL (ref 0.44–1.00)
GFR calc Af Amer: >60 mL/min (ref >60)
GFR calc non Af Amer: >60 mL/min (ref >60)
Glucose, Bld: 116 mg/dL — ABNORMAL HIGH (ref 65–99)
Potassium: 3.7 mmol/L (ref 3.5–5.1)
Sodium: 137 mmol/L (ref 135–145)

## 2017-09-22 LAB — HEPARIN LEVEL (UNFRACTIONATED): Heparin Unfractionated: 0.25 IU/mL — ABNORMAL LOW (ref 0.30–0.70)

## 2017-09-22 LAB — CBC
HCT: 35.3 % — ABNORMAL LOW (ref 36.0–46.0)
Hemoglobin: 10.9 g/dL — ABNORMAL LOW (ref 12.0–15.0)
MCH: 25.9 pg — ABNORMAL LOW (ref 26.0–34.0)
MCHC: 30.9 g/dL (ref 30.0–36.0)
MCV: 83.8 fL (ref 78.0–100.0)
Platelets: 296 10*3/uL (ref 150–400)
RBC: 4.21 MIL/uL (ref 3.87–5.11)
RDW: 15.2 % (ref 11.5–15.5)
WBC: 12.2 10*3/uL — ABNORMAL HIGH (ref 4.0–10.5)

## 2017-09-22 MED ORDER — MORPHINE SULFATE (PF) 2 MG/ML IV SOLN
1.0000 mg | INTRAVENOUS | Status: DC | PRN
  Administered 2017-09-22 – 2017-09-23 (×4): 1 mg via INTRAVENOUS
  Filled 2017-09-22 (×4): qty 1

## 2017-09-22 MED ORDER — METOPROLOL TARTRATE 50 MG PO TABS
50.0000 mg | ORAL_TABLET | Freq: Two times a day (BID) | ORAL | Status: DC
  Administered 2017-09-22 – 2017-09-25 (×7): 50 mg via ORAL
  Filled 2017-09-22 (×8): qty 1

## 2017-09-22 MED ORDER — HEPARIN (PORCINE) IN NACL 100-0.45 UNIT/ML-% IJ SOLN
1200.0000 [IU]/h | INTRAMUSCULAR | Status: AC
  Administered 2017-09-22: 1200 [IU]/h via INTRAVENOUS

## 2017-09-22 MED ORDER — LIDOCAINE 5 % EX PTCH
3.0000 | MEDICATED_PATCH | Freq: Every day | CUTANEOUS | Status: DC
  Administered 2017-09-22 – 2017-09-25 (×3): 3 via TRANSDERMAL
  Filled 2017-09-22 (×4): qty 3

## 2017-09-22 MED ORDER — CEFAZOLIN SODIUM-DEXTROSE 2-4 GM/100ML-% IV SOLN
2.0000 g | INTRAVENOUS | Status: DC
  Filled 2017-09-22: qty 100

## 2017-09-22 NOTE — Progress Notes (Signed)
PROGRESS NOTE    Sara Garcia  [id number]:[id number] DOB: [birth year] DOA: 09/18/2017 PCP: Doree Albee, MD     Brief Narrative:  TIYANNA Garcia is a 83 yo female with past medical history of hypertension, paroxysmal A. fib, CVA 07/2017, DVT, GERD, asthma who fell out of bed on August 2018 and suffered a left-sided hip fracture.  She underwent repair by Dr. Lyla Glassing at that time.  Since surgical intervention, she continued to have pain but not at the surgical site but more in her left groin.  This prompted a CT scan of the pelvis which revealed a pseudoaneurysm.  Orthopedic surgery consulted vascular surgery for intervention.  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 No further cardiac testing indicated prior to surgery   Paroxysmal A Fib -CHA2DS2-VASc 7 -Continue metoprolol, cardizem. Dose of metoprolol increased due to continued tachycardia rate in the 110s  -Continue heparin gtt, resume Xarelto post-operatively   Recent CVA -In 07/2017. Switched from Eliquis to Xarelto  Left hip fracture s/p repair on 8/29 -Followed by Dr. Lyla Glassing   GERD -Continue PPI  Chronic right middle and left lower lobe atelectasis  -Chronic opacities within the lungs dating back to 2016  Depression -Continue cymbalta    DVT prophylaxis: Heparin gtt Code Status: DNR Family Communication: No family at bedside Disposition Plan: Pending surgery 12/11    Consultants:   Cardiology  Vascular surgery  Procedures:   None  Antimicrobials:  Anti-infectives (From admission, onward)   None       Subjective: No complaints, had groin pain again this morning but much better now.   Objective: Vitals:   09/22/17 0542 09/22/17 0745 09/22/17 0747 09/22/17 0937  BP:  127/71   (!) 150/88  Pulse: (!) 115   (!) 122  Resp: 19     Temp: 98.5 F (36.9 C)     TempSrc: Oral     SpO2: 91% (!) 89% 94%   Weight:      Height:        Intake/Output Summary (Last 24 hours) at 09/22/2017 1115 Last data filed at 09/22/2017 1020 Gross per 24 hour  Intake 519 ml  Output 300 ml  Net 219 ml   Filed Weights   09/19/17 1516 09/20/17 0500 09/21/17 0500  Weight: 64.7 kg (142 lb 11.2 oz) 66.9 kg (147 lb 7.8 oz) 66.8 kg (147 lb 4.3 oz)    Examination:  General exam: Appears calm and comfortable  Respiratory system: Clear to auscultation. Respiratory effort normal. Cardiovascular system: S1 & S2 heard, Tachycardic rate 100s, regular rhythm. No JVD, murmurs, rubs, gallops or clicks. No pedal edema. Gastrointestinal system: Abdomen is nondistended, soft and nontender. No organomegaly or masses felt. Normal bowel sounds heard. Central nervous system: Alert and oriented. No focal neurological deficits. Extremities: Symmetric  Skin: No rashes, lesions or ulcers Psychiatry: Judgement and insight appear normal. Mood & affect appropriate.   Data Reviewed: I have personally reviewed following labs and imaging studies  CBC: Recent Labs  Lab 09/18/17 1431 09/19/17 0314 09/20/17 0320 09/21/17 0310 09/22/17 0313  WBC 9.2 10.0 10.1 10.3 12.2*  NEUTROABS 4.9  --   --   --   --   HGB 10.9* 11.6* 11.0* 11.0* 10.9*  HCT 35.6* 37.1 34.9* 35.6* 35.3*  MCV 86.2  83.7 83.1 83.6 83.8  PLT 350 329 305 290 742   Basic Metabolic Panel: Recent Labs  Lab 09/18/17 1431 09/19/17 0314 09/20/17 0320 09/21/17 0310 09/22/17 0313  NA 140 139 138 136 137  K 3.5 3.2* 3.7 4.1 3.7  CL 106 105 107 104 103  CO2 25 25 22 23 25   GLUCOSE 122* 142* 108* 107* 116*  BUN 22* 18 16 16 15   CREATININE 0.69 0.75 0.70 0.67 0.76  CALCIUM 8.9 8.8* 8.5* 8.5* 8.6*   GFR: Estimated Creatinine Clearance: 44.4 mL/min (by C-G formula based on SCr of 0.76 mg/dL). Liver Function Tests: Recent Labs    Lab 09/18/17 1431 09/19/17 0314  AST 22 23  ALT 12* 12*  ALKPHOS 86 87  BILITOT 0.6 0.3  PROT 6.7 6.8  ALBUMIN 3.3* 3.2*   No results for input(s): LIPASE, AMYLASE in the last 168 hours. No results for input(s): AMMONIA in the last 168 hours. Coagulation Profile: No results for input(s): INR, PROTIME in the last 168 hours. Cardiac Enzymes: No results for input(s): CKTOTAL, CKMB, CKMBINDEX, TROPONINI in the last 168 hours. BNP (last 3 results) No results for input(s): PROBNP in the last 8760 hours. HbA1C: No results for input(s): HGBA1C in the last 72 hours. CBG: No results for input(s): GLUCAP in the last 168 hours. Lipid Profile: No results for input(s): CHOL, HDL, LDLCALC, TRIG, CHOLHDL, LDLDIRECT in the last 72 hours. Thyroid Function Tests: No results for input(s): TSH, T4TOTAL, FREET4, T3FREE, THYROIDAB in the last 72 hours. Anemia Panel: No results for input(s): VITAMINB12, FOLATE, FERRITIN, TIBC, IRON, RETICCTPCT in the last 72 hours. Sepsis Labs: No results for input(s): PROCALCITON, LATICACIDVEN in the last 168 hours.  No results found for this or any previous visit (from the past 240 hour(s)).     Radiology Studies: No results found.    Scheduled Meds: . diltiazem  60 mg Oral BID  . DULoxetine  60 mg Oral BID  . lidocaine  3 patch Transdermal [id number]  . metoprolol tartrate  50 mg Oral BID  . mometasone-formoterol  2 puff Inhalation BID  . pantoprazole  40 mg Oral Daily  . pyridoxine  200 mg Oral Daily  . vitamin B-12  1,000 mcg Oral Daily   Continuous Infusions: . heparin 1,200 Units/hr (09/22/17 1053)     LOS: 4 days    Time spent: 20 minutes   Dessa Phi, DO [facility] [email or web address] Password [email or web address] 09/22/2017, 11:15 AM

## 2017-09-22 NOTE — H&P (View-Only) (Signed)
   VASCULAR SURGERY ASSESSMENT & PLAN:   The patient is scheduled for repair of her left common femoral artery pseudoaneurysm and arteriovenous fistula tomorrow.  Given her age she is clearly at slightly increased risk for surgery.  However, she has had persistent pain and has a large spicule of bone adjacent to the pseudoaneurysm.  Therefore I do not think there is really any other option but to fix this.  Certainly the fact that the injury was 5 weeks ago will make for more difficult dissection given the scar tissue.  I have discussed the procedure and potential complications with her and she is agreeable to proceed.  She has been off her Xarelto.  We will stop her heparin in the morning prior to surgery.  SUBJECTIVE:   Currently not having pain but was having pain all last night.  PHYSICAL EXAM:   Vitals:   09/22/17 0542 09/22/17 0745 09/22/17 0747 09/22/17 0937  BP: 127/71   (!) 150/88  Pulse: (!) 115   (!) 122  Resp: 19     Temp: 98.5 F (36.9 C)     TempSrc: Oral     SpO2: 91% (!) 89% 94%   Weight:      Height:       No swelling in the left groin.  LABS:   Lab Results  Component Value Date   WBC 12.2 (H) 09/22/2017   HGB 10.9 (L) 09/22/2017   HCT 35.3 (L) 09/22/2017   MCV 83.8 09/22/2017   PLT 296 09/22/2017   Lab Results  Component Value Date   CREATININE 0.76 09/22/2017   Lab Results  Component Value Date   INR 1.21 07/21/2017   CBG (last 3)  No results for input(s): GLUCAP in the last 72 hours.  PROBLEM LIST:    Principal Problem:   Pseudoaneurysm ([place]) Active Problems:   Atrial fibrillation ([facility])   CURRENT MEDS:   . diltiazem  60 mg Oral BID  . DULoxetine  60 mg Oral BID  . lidocaine  3 patch Transdermal Q0600  . metoprolol tartrate  50 mg Oral BID  . mometasone-formoterol  2 puff Inhalation BID  . pantoprazole  40 mg Oral Daily  . pyridoxine  200 mg Oral Daily  . vitamin B-12  1,000 mcg Oral Daily    [name] Beeper:  [phone] Office: [phone] 09/22/2017

## 2017-09-22 NOTE — Progress Notes (Signed)
   VASCULAR SURGERY ASSESSMENT & PLAN:   The patient is scheduled for repair of her left common femoral artery pseudoaneurysm and arteriovenous fistula tomorrow.  Given her age she is clearly at slightly increased risk for surgery.  However, she has had persistent pain and has a large spicule of bone adjacent to the pseudoaneurysm.  Therefore I do not think there is really any other option but to fix this.  Certainly the fact that the injury was 5 weeks ago will make for more difficult dissection given the scar tissue.  I have discussed the procedure and potential complications with her and she is agreeable to proceed.  She has been off her Xarelto.  We will stop her heparin in the morning prior to surgery.  SUBJECTIVE:   Currently not having pain but was having pain all last night.  PHYSICAL EXAM:   Vitals:   09/22/17 0542 09/22/17 0745 09/22/17 0747 09/22/17 0937  BP: 127/71   (!) 150/88  Pulse: (!) 115   (!) 122  Resp: 19     Temp: 98.5 F (36.9 C)     TempSrc: Oral     SpO2: 91% (!) 89% 94%   Weight:      Height:       No swelling in the left groin.  LABS:   Lab Results  Component Value Date   WBC 12.2 (H) 09/22/2017   HGB 10.9 (L) 09/22/2017   HCT 35.3 (L) 09/22/2017   MCV 83.8 09/22/2017   PLT 296 09/22/2017   Lab Results  Component Value Date   CREATININE 0.76 09/22/2017   Lab Results  Component Value Date   INR 1.21 07/21/2017   CBG (last 3)  No results for input(s): GLUCAP in the last 72 hours.  PROBLEM LIST:    Principal Problem:   Pseudoaneurysm ([place]) Active Problems:   Atrial fibrillation ([facility])   CURRENT MEDS:   . diltiazem  60 mg Oral BID  . DULoxetine  60 mg Oral BID  . lidocaine  3 patch Transdermal Q0600  . metoprolol tartrate  50 mg Oral BID  . mometasone-formoterol  2 puff Inhalation BID  . pantoprazole  40 mg Oral Daily  . pyridoxine  200 mg Oral Daily  . vitamin B-12  1,000 mcg Oral Daily    Sara Garcia Beeper:  [phone] Office: [phone] 09/22/2017

## 2017-09-22 NOTE — Progress Notes (Signed)
[facility] for Heparin . Indication: atrial fibrillation  Allergies  Allergen Reactions  . Latex Itching and Rash  . Adhesive [Tape] Other (See Comments)    Tears skin  . Betadine [Povidone Iodine]     blisters  . Ciprofloxacin Nausea And Vomiting  . Codeine Nausea And Vomiting    Upsets stomach, nightmares  . Penicillins Hives    Has patient had a PCN reaction causing immediate rash, facial/tongue/throat swelling, SOB or lightheadedness with hypotension: yes Has patient had a PCN reaction causing severe rash involving mucus membranes or skin necrosis: yes Has patient had a PCN reaction that required hospitalization: no Has patient had a PCN reaction occurring within the last 10 years: no If all of the above answers are "NO", then may proceed with Cephalosporin use.   . Sulfa Antibiotics Hives  . Wellbutrin [Bupropion]     Reaction unknown   Patient Measurements: Height: 5\' 2"  (157.5 cm) Weight: 147 lb 4.3 oz (66.8 kg) IBW/kg (Calculated) : 50.1  Heparin dosing weight: 63 kg  Vital Signs: Temp: 98.5 F (36.9 C) (12/10 0542) Temp Source: Oral (12/10 0542) BP: 127/71 (12/10 0542) Pulse Rate: 115 (12/10 0542)  Labs: Recent Labs    09/19/17 0941 09/19/17 1941  09/20/17 0320 09/20/17 1120 09/21/17 0310 09/22/17 0313  HGB  --   --    < > 11.0*  --  11.0* 10.9*  HCT  --   --   --  34.9*  --  35.6* 35.3*  PLT  --   --   --  305  --  290 296  APTT 33 43*  --   --   --   --   --   HEPARINUNFRC 0.28* 0.15*  --  0.48 0.48 0.57 0.25*  CREATININE  --   --   --  0.70  --  0.67 0.76   < > = values in this interval not displayed.    Estimated Creatinine Clearance: 44.4 mL/min (by C-G formula based on SCr of 0.76 mg/dL).  Assessment: 83 y.o. female with h/o Afib, Xarelto on hold, for heparin Goal of Therapy:  Heparin level 0.3-0.7 units/mL Monitor platelets by anticoagulation protocol: Yes   Plan:  Increase Heparin 1200  units/hr  Phillis Knack, PharmD, BCPS

## 2017-09-22 NOTE — Progress Notes (Signed)
  Progress Note    09/22/2017 10:29 AM * No surgery date entered *  Subjective:  Pain in L groin relieved with lidocaine patches   Vitals:   09/22/17 0747 09/22/17 0937  BP:  (!) 150/88  Pulse:  (!) 122  Resp:    Temp:    SpO2: 94%    Physical Exam: Cardiac:  R Lungs:  Clear to auscultation Incisions: none Extremities:  Palpable and symmetrical DP pulses; patient could not tolerate palpation of L groin due to pain, L groin soft Abdomen:  Soft Neurologic: A&O  CBC    Component Value Date/Time   WBC 12.2 (H) 09/22/2017 0313   RBC 4.21 09/22/2017 0313   HGB 10.9 (L) 09/22/2017 0313   HCT 35.3 (L) 09/22/2017 0313   PLT 296 09/22/2017 0313   MCV 83.8 09/22/2017 0313   MCH 25.9 (L) 09/22/2017 0313   MCHC 30.9 09/22/2017 0313   RDW 15.2 09/22/2017 0313   LYMPHSABS 3.5 09/18/2017 1431   MONOABS 0.6 09/18/2017 1431   EOSABS 0.2 09/18/2017 1431   BASOSABS 0.0 09/18/2017 1431    BMET    Component Value Date/Time   NA 137 09/22/2017 0313   K 3.7 09/22/2017 0313   CL 103 09/22/2017 0313   CO2 25 09/22/2017 0313   GLUCOSE 116 (H) 09/22/2017 0313   BUN 15 09/22/2017 0313   CREATININE 0.76 09/22/2017 0313   CREATININE 0.75 09/10/2016 1225   CALCIUM 8.6 (L) 09/22/2017 0313   GFRNONAA >60 09/22/2017 0313   GFRAA >60 09/22/2017 0313    INR    Component Value Date/Time   INR 1.21 07/21/2017 1924     Intake/Output Summary (Last 24 hours) at 09/22/2017 1029 Last data filed at 09/22/2017 1020 Gross per 24 hour  Intake 519 ml  Output 300 ml  Net 219 ml     Assessment/Plan:  83 y.o. female  With pseudoaneurysm L distal CFA  Plan in for surgical exploration and repair of L groin psuedoaneurysm by Dr. Scot Dock tomorrow 09/23/17 Consent to be obtained by nursing staff Continue to hold Xarelto; continue heparin bridge NPO after midnight Patient is in agreement with this plan   Dagoberto Ligas, PA-C [facility] [phone] 09/22/2017 10:29  AM

## 2017-09-23 ENCOUNTER — Inpatient Hospital Stay (HOSPITAL_COMMUNITY): Payer: Medicare HMO | Admitting: Certified Registered Nurse Anesthetist

## 2017-09-23 ENCOUNTER — Encounter (HOSPITAL_COMMUNITY): Payer: Self-pay | Admitting: Certified Registered Nurse Anesthetist

## 2017-09-23 ENCOUNTER — Encounter (HOSPITAL_COMMUNITY)
Admission: EM | Disposition: A | Discharge status: Skilled Nursing Facility | Payer: Self-pay | Source: Home / Self Care | Attending: Internal Medicine

## 2017-09-23 HISTORY — PX: FALSE ANEURYSM REPAIR: SHX5152

## 2017-09-23 LAB — CBC
HCT: 32.9 % — ABNORMAL LOW (ref 36.0–46.0)
Hemoglobin: 10 g/dL — ABNORMAL LOW (ref 12.0–15.0)
MCH: 25.6 pg — ABNORMAL LOW (ref 26.0–34.0)
MCHC: 30.4 g/dL (ref 30.0–36.0)
MCV: 84.1 fL (ref 78.0–100.0)
Platelets: 237 10*3/uL (ref 150–400)
RBC: 3.91 MIL/uL (ref 3.87–5.11)
RDW: 15.4 % (ref 11.5–15.5)
WBC: 12.3 10*3/uL — ABNORMAL HIGH (ref 4.0–10.5)

## 2017-09-23 LAB — TYPE AND SCREEN
ABO/RH(D): O NEG
Antibody Screen: NEGATIVE

## 2017-09-23 LAB — SURGICAL PCR SCREEN
MRSA, PCR: NEGATIVE
Staphylococcus aureus: POSITIVE — AB

## 2017-09-23 LAB — BASIC METABOLIC PANEL
Anion gap: 7 (ref 5–15)
BUN: 17 mg/dL (ref 6–20)
CO2: 26 mmol/L (ref 22–32)
Calcium: 8.4 mg/dL — ABNORMAL LOW (ref 8.9–10.3)
Chloride: 102 mmol/L (ref 101–111)
Creatinine, Ser: 0.75 mg/dL (ref 0.44–1.00)
GFR calc Af Amer: >60 mL/min (ref >60)
GFR calc non Af Amer: >60 mL/min (ref >60)
Glucose, Bld: 116 mg/dL — ABNORMAL HIGH (ref 65–99)
Potassium: 3.9 mmol/L (ref 3.5–5.1)
Sodium: 135 mmol/L (ref 135–145)

## 2017-09-23 LAB — PROTIME-INR
INR: 1.13
Prothrombin Time: 14.4 seconds (ref 11.4–15.2)

## 2017-09-23 LAB — HEPARIN LEVEL (UNFRACTIONATED): Heparin Unfractionated: 0.26 IU/mL — ABNORMAL LOW (ref 0.30–0.70)

## 2017-09-23 LAB — ABO/RH: ABO/RH(D): O NEG

## 2017-09-23 SURGERY — REPAIR, PSEUDOANEURYSM
Anesthesia: General | Laterality: Left

## 2017-09-23 MED ORDER — MORPHINE SULFATE (PF) 2 MG/ML IV SOLN
1.0000 mg | INTRAVENOUS | Status: AC
  Administered 2017-09-23: 1 mg via INTRAVENOUS
  Filled 2017-09-23: qty 1

## 2017-09-23 MED ORDER — SODIUM CHLORIDE 0.9 % IV SOLN
INTRAVENOUS | Status: DC | PRN
  Administered 2017-09-23: 500 mL

## 2017-09-23 MED ORDER — PROPOFOL 10 MG/ML IV BOLUS
INTRAVENOUS | Status: DC | PRN
  Administered 2017-09-23: 80 mg via INTRAVENOUS

## 2017-09-23 MED ORDER — OXYCODONE HCL 5 MG PO TABS
5.0000 mg | ORAL_TABLET | ORAL | Status: DC | PRN
  Administered 2017-09-23 – 2017-09-25 (×7): 5 mg via ORAL
  Filled 2017-09-23 (×7): qty 1

## 2017-09-23 MED ORDER — MAGNESIUM SULFATE 2 GM/50ML IV SOLN: 2.0000 g | Freq: Every day | INTRAVENOUS | Status: DC | PRN

## 2017-09-23 MED ORDER — EPHEDRINE 5 MG/ML INJ
INTRAVENOUS | Status: AC
  Filled 2017-09-23: qty 10

## 2017-09-23 MED ORDER — VANCOMYCIN HCL IN DEXTROSE 1-5 GM/200ML-% IV SOLN
INTRAVENOUS | Status: AC
  Filled 2017-09-23: qty 200

## 2017-09-23 MED ORDER — PAPAVERINE HCL 30 MG/ML IJ SOLN
INTRAMUSCULAR | Status: AC
  Filled 2017-09-23: qty 2

## 2017-09-23 MED ORDER — ONDANSETRON HCL 4 MG/2ML IJ SOLN
INTRAMUSCULAR | Status: AC
  Filled 2017-09-23: qty 2

## 2017-09-23 MED ORDER — HEPARIN (PORCINE) IN NACL 100-0.45 UNIT/ML-% IJ SOLN: 1200.0000 [IU]/h | INTRAMUSCULAR | Status: DC

## 2017-09-23 MED ORDER — SODIUM CHLORIDE 0.9 % IV SOLN: INTRAVENOUS | Status: DC

## 2017-09-23 MED ORDER — ROCURONIUM BROMIDE 100 MG/10ML IV SOLN
INTRAVENOUS | Status: DC | PRN
  Administered 2017-09-23: 50 mg via INTRAVENOUS
  Administered 2017-09-23: 20 mg via INTRAVENOUS

## 2017-09-23 MED ORDER — HEPARIN SODIUM (PORCINE) 1000 UNIT/ML IJ SOLN
INTRAMUSCULAR | Status: DC | PRN
  Administered 2017-09-23: 7000 [IU] via INTRAVENOUS

## 2017-09-23 MED ORDER — SODIUM CHLORIDE 0.9 % IV SOLN: 500.0000 mL | Freq: Once | INTRAVENOUS | Status: DC | PRN

## 2017-09-23 MED ORDER — DEXAMETHASONE SODIUM PHOSPHATE 10 MG/ML IJ SOLN
INTRAMUSCULAR | Status: DC | PRN
  Administered 2017-09-23: 5 mg via INTRAVENOUS

## 2017-09-23 MED ORDER — VANCOMYCIN HCL 1000 MG IV SOLR
INTRAVENOUS | Status: DC | PRN
  Administered 2017-09-23: 1000 mg via INTRAVENOUS

## 2017-09-23 MED ORDER — ONDANSETRON HCL 4 MG/2ML IJ SOLN: 4.0000 mg | Freq: Once | INTRAMUSCULAR | Status: DC | PRN

## 2017-09-23 MED ORDER — SODIUM CHLORIDE 0.9 % IV SOLN: Freq: Once | INTRAVENOUS | Status: DC

## 2017-09-23 MED ORDER — PROPOFOL 10 MG/ML IV BOLUS
INTRAVENOUS | Status: AC
  Filled 2017-09-23: qty 20

## 2017-09-23 MED ORDER — LIDOCAINE HCL (CARDIAC) 20 MG/ML IV SOLN
INTRAVENOUS | Status: DC | PRN
  Administered 2017-09-23: 100 mg via INTRAVENOUS

## 2017-09-23 MED ORDER — SUGAMMADEX SODIUM 200 MG/2ML IV SOLN
INTRAVENOUS | Status: DC | PRN
  Administered 2017-09-23: 200 mg via INTRAVENOUS

## 2017-09-23 MED ORDER — FENTANYL CITRATE (PF) 100 MCG/2ML IJ SOLN
INTRAMUSCULAR | Status: DC | PRN
  Administered 2017-09-23 (×3): 25 ug via INTRAVENOUS
  Administered 2017-09-23: 150 ug via INTRAVENOUS
  Administered 2017-09-23: 25 ug via INTRAVENOUS

## 2017-09-23 MED ORDER — POTASSIUM CHLORIDE CRYS ER 20 MEQ PO TBCR: 20.0000 meq | EXTENDED_RELEASE_TABLET | Freq: Every day | ORAL | Status: DC | PRN

## 2017-09-23 MED ORDER — ONDANSETRON HCL 4 MG/2ML IJ SOLN
INTRAMUSCULAR | Status: DC | PRN
  Administered 2017-09-23: 4 mg via INTRAVENOUS

## 2017-09-23 MED ORDER — ESMOLOL HCL 100 MG/10ML IV SOLN
INTRAVENOUS | Status: DC | PRN
  Administered 2017-09-23 (×2): 30 mg via INTRAVENOUS

## 2017-09-23 MED ORDER — MEPERIDINE HCL 25 MG/ML IJ SOLN: 6.2500 mg | INTRAMUSCULAR | Status: DC | PRN

## 2017-09-23 MED ORDER — GUAIFENESIN-DM 100-10 MG/5ML PO SYRP: 15.0000 mL | ORAL_SOLUTION | ORAL | Status: DC | PRN

## 2017-09-23 MED ORDER — LACTATED RINGERS IV SOLN
INTRAVENOUS | Status: DC | PRN
  Administered 2017-09-23 (×2): via INTRAVENOUS

## 2017-09-23 MED ORDER — SUCCINYLCHOLINE CHLORIDE 200 MG/10ML IV SOSY
PREFILLED_SYRINGE | INTRAVENOUS | Status: AC
  Filled 2017-09-23: qty 10

## 2017-09-23 MED ORDER — HYDROMORPHONE HCL 1 MG/ML IJ SOLN: 0.2500 mg | INTRAMUSCULAR | Status: DC | PRN

## 2017-09-23 MED ORDER — DOCUSATE SODIUM 100 MG PO CAPS
100.0000 mg | ORAL_CAPSULE | Freq: Every day | ORAL | Status: DC
  Administered 2017-09-24 – 2017-09-25 (×2): 100 mg via ORAL
  Filled 2017-09-23 (×2): qty 1

## 2017-09-23 MED ORDER — FENTANYL CITRATE (PF) 250 MCG/5ML IJ SOLN
INTRAMUSCULAR | Status: AC
  Filled 2017-09-23: qty 5

## 2017-09-23 MED ORDER — CLINDAMYCIN PHOSPHATE 600 MG/50ML IV SOLN
600.0000 mg | INTRAVENOUS | Status: DC
Start: 2017-09-23 — End: 2017-09-23

## 2017-09-23 MED ORDER — METOPROLOL TARTRATE 5 MG/5ML IV SOLN: 2.0000 mg | INTRAVENOUS | Status: DC | PRN

## 2017-09-23 MED ORDER — 0.9 % SODIUM CHLORIDE (POUR BTL) OPTIME
TOPICAL | Status: DC | PRN
  Administered 2017-09-23: 2000 mL

## 2017-09-23 MED ORDER — PHENYLEPHRINE 40 MCG/ML (10ML) SYRINGE FOR IV PUSH (FOR BLOOD PRESSURE SUPPORT)
PREFILLED_SYRINGE | INTRAVENOUS | Status: DC | PRN
  Administered 2017-09-23: 80 ug via INTRAVENOUS
  Administered 2017-09-23: 120 ug via INTRAVENOUS
  Administered 2017-09-23: 80 ug via INTRAVENOUS

## 2017-09-23 MED ORDER — VANCOMYCIN HCL IN DEXTROSE 1-5 GM/200ML-% IV SOLN
1000.0000 mg | Freq: Two times a day (BID) | INTRAVENOUS | Status: AC
  Administered 2017-09-23 – 2017-09-24 (×2): 1000 mg via INTRAVENOUS
  Filled 2017-09-23 (×2): qty 200

## 2017-09-23 MED ORDER — LABETALOL HCL 5 MG/ML IV SOLN: 10.0000 mg | INTRAVENOUS | Status: DC | PRN

## 2017-09-23 MED ORDER — ALUM & MAG HYDROXIDE-SIMETH 200-200-20 MG/5ML PO SUSP: 15.0000 mL | ORAL | Status: DC | PRN

## 2017-09-23 MED ORDER — PHENOL 1.4 % MT LIQD: 1.0000 | OROMUCOSAL | Status: DC | PRN

## 2017-09-23 MED ORDER — HEPARIN (PORCINE) IN NACL 100-0.45 UNIT/ML-% IJ SOLN
1300.0000 [IU]/h | INTRAMUSCULAR | Status: DC
  Administered 2017-09-23: 1200 [IU]/h via INTRAVENOUS
  Filled 2017-09-23: qty 250

## 2017-09-23 SURGICAL SUPPLY — 72 items
ADH SKN CLS APL DERMABOND .7 (GAUZE/BANDAGES/DRESSINGS) ×1
ADH SKN CLS LQ APL DERMABOND (GAUZE/BANDAGES/DRESSINGS) ×1
BANDAGE ACE 4X5 VEL STRL LF (GAUZE/BANDAGES/DRESSINGS) IMPLANT
BANDAGE ESMARK 6X9 LF (GAUZE/BANDAGES/DRESSINGS) IMPLANT
BNDG CMPR 9X6 STRL LF SNTH (GAUZE/BANDAGES/DRESSINGS)
BNDG ESMARK 6X9 LF (GAUZE/BANDAGES/DRESSINGS)
CANISTER SUCT 3000ML PPV (MISCELLANEOUS) ×2 IMPLANT
CANNULA VESSEL 3MM 2 BLNT TIP (CANNULA) IMPLANT
CANNULA VESSEL W/WING W/VALVE (CANNULA) ×2 IMPLANT
CATH FOLEY LATEX FREE 16FR (CATHETERS) ×2
CATH FOLEY LF 16FR (CATHETERS) IMPLANT
CHLORAPREP W/TINT 26ML (MISCELLANEOUS) ×2 IMPLANT
CLIP VESOCCLUDE MED 24/CT (CLIP) ×2 IMPLANT
CLIP VESOCCLUDE SM WIDE 24/CT (CLIP) ×2 IMPLANT
CUFF TOURNIQUET SINGLE 18IN (TOURNIQUET CUFF) IMPLANT
CUFF TOURNIQUET SINGLE 24IN (TOURNIQUET CUFF) IMPLANT
CUFF TOURNIQUET SINGLE 34IN LL (TOURNIQUET CUFF) IMPLANT
CUFF TOURNIQUET SINGLE 44IN (TOURNIQUET CUFF) IMPLANT
DERMABOND ADHESIVE PROPEN (GAUZE/BANDAGES/DRESSINGS) ×1
DERMABOND ADVANCED (GAUZE/BANDAGES/DRESSINGS) ×1
DERMABOND ADVANCED .7 DNX12 (GAUZE/BANDAGES/DRESSINGS) IMPLANT
DERMABOND ADVANCED .7 DNX6 (GAUZE/BANDAGES/DRESSINGS) IMPLANT
DRAIN CHANNEL 15F RND FF W/TCR (WOUND CARE) ×1 IMPLANT
DRAIN PENROSE 3/4X12 (DRAIN) IMPLANT
DRAPE INCISE 23X17 IOBAN STRL (DRAPES) ×1
DRAPE INCISE 23X17 STRL (DRAPES) IMPLANT
DRAPE INCISE IOBAN 23X17 STRL (DRAPES) ×1 IMPLANT
DRAPE X-RAY CASS 24X20 (DRAPES) IMPLANT
ELECT REM PT RETURN 9FT ADLT (ELECTROSURGICAL) ×2
ELECTRODE REM PT RTRN 9FT ADLT (ELECTROSURGICAL) ×1 IMPLANT
EVACUATOR SILICONE 100CC (DRAIN) ×1 IMPLANT
GAUZE SPONGE 4X4 12PLY STRL (GAUZE/BANDAGES/DRESSINGS) ×1 IMPLANT
GAUZE SPONGE 4X4 12PLY STRL LF (GAUZE/BANDAGES/DRESSINGS) ×1 IMPLANT
GAUZE SPONGE 4X4 16PLY XRAY LF (GAUZE/BANDAGES/DRESSINGS) ×1 IMPLANT
GLOVE BIO SURGEON STRL SZ7.5 (GLOVE) ×1 IMPLANT
GLOVE BIOGEL PI IND STRL 7.5 (GLOVE) IMPLANT
GLOVE BIOGEL PI IND STRL 8 (GLOVE) ×1 IMPLANT
GLOVE BIOGEL PI INDICATOR 7.5 (GLOVE) ×1
GLOVE BIOGEL PI INDICATOR 8 (GLOVE) ×1
GLOVE SURG SS PI 6.5 STRL IVOR (GLOVE) ×4 IMPLANT
GLOVE SURG SS PI 7.0 STRL IVOR (GLOVE) ×3 IMPLANT
GOWN STRL REUS W/ TWL LRG LVL3 (GOWN DISPOSABLE) ×3 IMPLANT
GOWN STRL REUS W/TWL LRG LVL3 (GOWN DISPOSABLE) ×6
GOWN STRL REUS W/TWL XL LVL4 (GOWN DISPOSABLE) ×1 IMPLANT
KIT BASIN OR (CUSTOM PROCEDURE TRAY) ×2 IMPLANT
KIT ROOM TURNOVER OR (KITS) ×2 IMPLANT
MARKER GRAFT CORONARY BYPASS (MISCELLANEOUS) IMPLANT
NS IRRIG 1000ML POUR BTL (IV SOLUTION) ×4 IMPLANT
PACK PERIPHERAL VASCULAR (CUSTOM PROCEDURE TRAY) ×2 IMPLANT
PAD ARMBOARD 7.5X6 YLW CONV (MISCELLANEOUS) ×4 IMPLANT
SET COLLECT BLD 21X3/4 12 (NEEDLE) IMPLANT
SPONGE INTESTINAL PEANUT (DISPOSABLE) ×1 IMPLANT
SPONGE SURGIFOAM ABS GEL 100 (HEMOSTASIS) IMPLANT
STAPLER VISISTAT (STAPLE) IMPLANT
STOPCOCK 4 WAY LG BORE MALE ST (IV SETS) IMPLANT
SUT BONE WAX W31G (SUTURE) ×1 IMPLANT
SUT ETHILON 3 0 PS 1 (SUTURE) IMPLANT
SUT PROLENE 5 0 C 1 24 (SUTURE) ×3 IMPLANT
SUT PROLENE 6 0 BV (SUTURE) ×3 IMPLANT
SUT PROLENE 7 0 BV 1 (SUTURE) IMPLANT
SUT SILK 2 0 PERMA HAND 18 BK (SUTURE) ×2 IMPLANT
SUT SILK 3 0 (SUTURE) ×2
SUT SILK 3-0 18XBRD TIE 12 (SUTURE) IMPLANT
SUT VIC AB 2-0 CTB1 (SUTURE) ×3 IMPLANT
SUT VIC AB 3-0 SH 27 (SUTURE) ×4
SUT VIC AB 3-0 SH 27X BRD (SUTURE) ×2 IMPLANT
SUT VICRYL 4-0 PS2 18IN ABS (SUTURE) ×3 IMPLANT
TOWEL GREEN STERILE (TOWEL DISPOSABLE) ×2 IMPLANT
TRAY FOLEY W/METER SILVER 16FR (SET/KITS/TRAYS/PACK) ×1 IMPLANT
TUBING EXTENTION W/L.L. (IV SETS) IMPLANT
UNDERPAD 30X30 (UNDERPADS AND DIAPERS) ×2 IMPLANT
WATER STERILE IRR 1000ML POUR (IV SOLUTION) ×2 IMPLANT

## 2017-09-23 NOTE — Transfer of Care (Signed)
Immediate Anesthesia Transfer of Care Note  Patient: Sara Garcia  Procedure(s) Performed: REPAIR FALSE ANEURYSM COMMON LEFT FEMORAL ARTERY WITH OSTEOTOMY OF BONE FRAGEMENT (Left )  Patient Location: PACU  Anesthesia Type:General  Level of Consciousness: drowsy  Airway & Oxygen Therapy: Patient Spontanous Breathing and Patient connected to face mask oxygen  Post-op Assessment: Report given to RN and Post -op Vital signs reviewed and stable  Post vital signs: Reviewed and stable  Last Vitals:  Vitals:   09/23/17 0542 09/23/17 1021  BP: 120/60   Pulse: 70   Resp: 17   Temp: 36.9 C (P) 36.5 C  SpO2: 96%     Last Pain:  Vitals:   09/23/17 0545  TempSrc:   PainSc: 5       Patients Stated Pain Goal: 4 (91/91/66 0600)  Complications: No apparent anesthesia complications

## 2017-09-23 NOTE — Anesthesia Postprocedure Evaluation (Signed)
Anesthesia Post Note  Patient: Sara Garcia  Procedure(s) Performed: REPAIR FALSE ANEURYSM COMMON LEFT FEMORAL ARTERY WITH OSTEOTOMY OF BONE FRAGEMENT (Left )     Patient location during evaluation: PACU Anesthesia Type: General Level of consciousness: awake and alert Pain management: pain level controlled Vital Signs Assessment: post-procedure vital signs reviewed and stable Respiratory status: spontaneous breathing, nonlabored ventilation, respiratory function stable and patient connected to nasal cannula oxygen Cardiovascular status: blood pressure returned to baseline and stable Postop Assessment: no apparent nausea or vomiting Anesthetic complications: no    Last Vitals:  Vitals:   09/23/17 1230 09/23/17 1232  BP:  (!) 167/86  Pulse: 97 99  Resp: 13   Temp:    SpO2: 94% 93%    Last Pain:  Vitals:   09/23/17 1345  TempSrc:   PainSc: 8                  Talin Rozeboom DAVID

## 2017-09-23 NOTE — Interval H&P Note (Signed)
History and Physical Interval Note:  09/23/2017 7:29 AM  Sara Garcia  has presented today for surgery, with the diagnosis of left common femoral artery pseudoaneurysm; left femoral arteriovenous fistula  The various methods of treatment have been discussed with the patient and family. After consideration of risks, benefits and other options for treatment, the patient has consented to  Procedure(s): REPAIR FALSE ANEURYSM COMMON FEMORAL ARTERY (Left) FEMORAL ARTERY FISTULA REPAIR (Left) as a surgical intervention .  The patient's history has been reviewed, patient examined, no change in status, stable for surgery.  I have reviewed the patient's chart and labs.  Questions were answered to the patient's satisfaction.     [name]

## 2017-09-23 NOTE — Progress Notes (Signed)
ANTICOAGULATION CONSULT NOTE - Follow Up Consult  Pharmacy Consult for Heparin Indication: atrial fibrillation (Xarelto PTA)  Allergies  Allergen Reactions  . Latex Itching and Rash  . Adhesive [Tape] Other (See Comments)    Tears skin  . Betadine [Povidone Iodine]     blisters  . Ciprofloxacin Nausea And Vomiting  . Codeine Nausea And Vomiting    Upsets stomach, nightmares  . Penicillins Hives    Has patient had a PCN reaction causing immediate rash, facial/tongue/throat swelling, SOB or lightheadedness with hypotension: yes Has patient had a PCN reaction causing severe rash involving mucus membranes or skin necrosis: yes Has patient had a PCN reaction that required hospitalization: no Has patient had a PCN reaction occurring within the last 10 years: no If all of the above answers are "NO", then may proceed with Cephalosporin use.   . Sulfa Antibiotics Hives  . Wellbutrin [Bupropion]     Reaction unknown    Patient Measurements: Height: 5\' 2"  (157.5 cm) Weight: 151 lb 3.8 oz (68.6 kg) IBW/kg (Calculated) : 50.1 Heparin Dosing Weight: 63 kg  Vital Signs: Temp: 97.4 F (36.3 C) (12/11 1130) Temp Source: Oral (12/11 0542) BP: 167/86 (12/11 1232) Pulse Rate: 99 (12/11 1232)  Labs: Recent Labs    09/21/17 0310 09/22/17 0313 09/23/17 0418  HGB 11.0* 10.9* 10.0*  HCT 35.6* 35.3* 32.9*  PLT 290 296 237  LABPROT  --   --  14.4  INR  --   --  1.13  HEPARINUNFRC 0.57 0.25* 0.26*  CREATININE 0.67 0.76 0.75    Estimated Creatinine Clearance: 45 mL/min (by C-G formula based on SCr of 0.75 mg/dL).   Medications:  Scheduled:  . diltiazem  60 mg Oral BID  . [START ON 09/24/2017] docusate sodium  100 mg Oral Daily  . DULoxetine  60 mg Oral BID  . lidocaine  3 patch Transdermal Q0600  . metoprolol tartrate  50 mg Oral BID  . mometasone-formoterol  2 puff Inhalation BID  . pantoprazole  40 mg Oral Daily  . pyridoxine  200 mg Oral Daily  . vitamin B-12  1,000 mcg Oral  Daily   Infusions:  . sodium chloride Stopped (09/23/17 1303)  . sodium chloride    . sodium chloride    . heparin    . magnesium sulfate 1 - 4 g bolus IVPB    . vancomycin    . vancomycin      Assessment: 83 yo F on Xarelto PTA for hx afib.  Pt was bridged with heparin in anticipation of CFA pseudoaneurysm repair (done 12/11).  Pt is now post-op with plans to resume anticoagulation in at 1830 tonight.  Will restart at previous heparin rate.  Goal of Therapy:  Heparin level 0.3-0.7 units/ml Monitor platelets by anticoagulation protocol: Yes   Plan:  Restart heparin at 1200 units/hr (at 1830 tonight) Heparin level and CBC with AM labs  Follow-up plans to restart [place], Pharm.D., BCPS Clinical Pharmacist Pager: [phone] Clinical phone for 09/23/2017 from 8:30-4:00 is [phone]. After 4pm, please call [facility] [phone]) for assistance. 09/23/2017 1:19 PM

## 2017-09-23 NOTE — Progress Notes (Signed)
PT Cancellation Note  Patient Details Name: Sara Garcia MRN: [id number] DOB: [birth year]   Cancelled Treatment:    Reason Eval/Treat Not Completed: Pain limiting ability to participate;Fatigue/lethargy limiting ability to participate. Pt very sleepy having had surgery this AM. Family reports that she didn't sleep at all last night. Requesting to hold therapy until tomorrow.   Hurt 09/23/2017, 3:17 PM

## 2017-09-23 NOTE — Op Note (Signed)
    NAME: Sara Garcia    MRN: [id number] DOB: 1928-02-10    DATE OF OPERATION: 09/23/2017  PREOP DIAGNOSIS:    Pseudoaneurysm left common femoral artery  POSTOP DIAGNOSIS:    Same  PROCEDURE:    Repair of pseudoaneurysm left femoral artery Osteotomy fragment of the femur  SURGEON: Judeth Cornfield. Scot Dock, MD, FACS  ASSIST: Benjamine Sprague PA  ANESTHESIA: General  EBL: Minimal  INDICATIONS:    Sara Garcia is a 83 y.o. female who fell and had a left hip fracture.  She was having pain in the left groin and this prompted a CT scan and duplex of the left groin which showed a pseudoaneurysm and possible AV fistula.  Was also a large fragment of bone adjacent to the pseudoaneurysm.  Given her persistent pain and risk for continued ongoing bleeding repair was recommended.  FINDINGS:   There was a single hole at the bifurcation of the common femoral artery into the deep femoral artery.  This was repaired with a 6-0 Prolene.  I skeletonized the vein no AV fistula could be identified.  I did an osteotomy to remove the point of the bone that was adjacent to the artery.  TECHNIQUE:   The patient was taken to the operating room and received a general anesthetic.  The left groin, abdomen, and entire left lower extremity were prepped and draped in usual sterile fashion.  A longitudinal incision was made in the left groin.  This was carried down to the pseudoaneurysm.  There was a large pulsatile mass and the anatomy was poorly defined given that this is been present for approximately 6 weeks.  There was significant scar tissue in this area.  I therefore focused on getting proximal distal control.  In order to get proximal control I had to mobilize the inguinal ligament and fairly high to get the external iliac artery above the inguinal ligament.  I also controlled several small branches.  I then extended the incision distally and was able to dissect out the superficial femoral artery.  This was  tracked up and I was also able to identify the deep femoral artery.  I mobilized the common femoral vein and no fistula could be identified.  And the vein surrounding the deep femoral artery were all ligated.  This allowed better exposure of the deep femoral artery.  Patient was heparinized.  The external iliac artery, superficial femoral artery, and deep femoral arteries were clamped.  The pseudoaneurysm was entered.  A large amount of hematoma was evacuated.  The bleeding was coming from the crotch between the common femoral artery and the deep femoral artery and this was repaired with a 6-0 Prolene suture.  The spicule of bone adjacent to the artery was identified in the depths of the wound and this was rongeured back to a smooth bone.  The bone was stopped fairly well and this is not something that could be removed in its entirety.  Hemostasis was obtained in the wound.  The heparin was partially reversed with protamine.  The wound was closed with 2 deep layers of 2-0 Vicryl, a subcutaneous layer with 3-0 Vicryl and the skin closed with 4-0 Vicryl.  Sterile dressing was applied.  Patient tolerated the procedure well was transferred to the recovery room in stable condition.  All needle and sponge counts were correct.   [name], MD, FACS [facility]  DATE OF DICTATION:   09/23/2017

## 2017-09-23 NOTE — Anesthesia Procedure Notes (Signed)
Procedure Name: Intubation Date/Time: 09/23/2017 7:53 AM Performed by: Carney Living, CRNA Pre-anesthesia Checklist: Patient identified, Emergency Drugs available, Suction available, Patient being monitored and Timeout performed Patient Re-evaluated:Patient Re-evaluated prior to induction Oxygen Delivery Method: Circle system utilized Preoxygenation: Pre-oxygenation with 100% oxygen Induction Type: IV induction Ventilation: Mask ventilation without difficulty Laryngoscope Size: Mac and 4 Grade View: Grade I Tube type: Oral Tube size: 7.0 mm Number of attempts: 1 Airway Equipment and Method: Stylet Placement Confirmation: ETT inserted through vocal cords under direct vision,  positive ETCO2 and breath sounds checked- equal and bilateral Secured at: 20 cm Tube secured with: Tape Dental Injury: Teeth and Oropharynx as per pre-operative assessment

## 2017-09-23 NOTE — Anesthesia Preprocedure Evaluation (Addendum)
Anesthesia Evaluation  Patient identified by MRN, date of birth, ID band Patient awake    Reviewed: Allergy & Precautions, NPO status , Patient's Chart, lab work & pertinent test results, reviewed documented beta blocker date and time   Airway Mallampati: I  TM Distance: >3 FB Neck ROM: Full    Dental  (+) Edentulous Upper, Edentulous Lower   Pulmonary asthma , COPD,  COPD inhaler,    Pulmonary exam normal        Cardiovascular hypertension, Pt. on medications and Pt. on home beta blockers Normal cardiovascular exam+ dysrhythmias Atrial Fibrillation      Neuro/Psych    GI/Hepatic GERD  Medicated and Controlled,  Endo/Other    Renal/GU      Musculoskeletal  (+) Arthritis , Osteoarthritis,    Abdominal   Peds  Hematology   Anesthesia Other Findings   Reproductive/Obstetrics                            Anesthesia Physical Anesthesia Plan  ASA: III  Anesthesia Plan: General   Post-op Pain Management:    Induction: Intravenous  PONV Risk Score and Plan: 3 and Treatment may vary due to age or medical condition, Dexamethasone and Ondansetron  Airway Management Planned: Oral ETT  Additional Equipment:   Intra-op Plan:   Post-operative Plan: Extubation in OR  Informed Consent: I have reviewed the patients History and Physical, chart, labs and discussed the procedure including the risks, benefits and alternatives for the proposed anesthesia with the patient or authorized representative who has indicated his/her understanding and acceptance.   Dental advisory given  Plan Discussed with: CRNA, Anesthesiologist and Surgeon  Anesthesia Plan Comments:        Anesthesia Quick Evaluation

## 2017-09-23 NOTE — Progress Notes (Signed)
PROGRESS NOTE    Sara Garcia  POE:[id number] DOB: 06-28-1928 DOA: 09/18/2017 PCP: Doree Albee, MD     Brief Narrative:  Sara Garcia is a 83 yo female with past medical history of hypertension, paroxysmal A. fib, CVA 07/2017, DVT, GERD, asthma who fell out of bed on August 2018 and suffered a left-sided hip fracture.  She underwent repair by Dr. Lyla Glassing at that time.  Since surgical intervention, she continued to have pain but not at the surgical site but more in her left groin.  This prompted a CT scan of the pelvis which revealed a pseudoaneurysm.  Orthopedic surgery consulted vascular surgery for intervention.  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 No further cardiac testing indicated prior to surgery  -S/p repair pseudoaneurysm left femoral artery and osteotomy fragment of femur 12/11  Paroxysmal A Fib -CHA2DS2-VASc 7 -Continue metoprolol, cardizem -Continue heparin gtt, resume Xarelto tomorrow if okay with vascular surgery   Recent CVA -In 07/2017. Switched from Eliquis to [place]. Resume Xarelto tomorrow if okay with vascular surgery   Left hip fracture s/p repair on 8/29 -Followed by Dr. Lyla Glassing as outpatient   GERD -Continue PPI  Chronic right middle and left lower lobe atelectasis  -Chronic opacities within the lungs dating back to 2016  Depression -Continue cymbalta    DVT prophylaxis: Heparin gtt Code Status: DNR Family Communication: Family at bedside  Disposition Plan: Pending post-op course    Consultants:   Cardiology  Vascular surgery  Procedures:  -S/p repair pseudoaneurysm left femoral artery and osteotomy fragment of femur 12/11  Antimicrobials:  Anti-infectives (From admission, onward)   Start     Dose/Rate Route Frequency Ordered Stop   09/23/17 1930  vancomycin  (VANCOCIN) IVPB 1000 mg/200 mL premix     1,000 mg 200 mL/hr over 60 Minutes Intravenous Every 12 hours 09/23/17 1211 09/24/17 1929   09/23/17 1210  clindamycin (CLEOCIN) IVPB 600 mg  Status:  Discontinued     600 mg 100 mL/hr over 30 Minutes Intravenous 30 min pre-op 09/23/17 1210 09/23/17 1217   09/23/17 0730  vancomycin (VANCOCIN) 1-5 GM/200ML-% IVPB    Comments:  Kerrie Pleasure   : cabinet override      09/23/17 0730 09/23/17 1944   09/23/17 0715  ceFAZolin (ANCEF) IVPB 2g/100 mL premix  Status:  Discontinued    Comments:  Send with pt to OR   2 g 200 mL/hr over 30 Minutes Intravenous To Surgery 09/22/17 1452 09/23/17 1153       Subjective: Seen post-operatively, complaining of some nausea but no other issues  Objective: Vitals:   09/23/17 1110 09/23/17 1130 09/23/17 1230 09/23/17 1232  BP:    (!) 167/86  Pulse: 98 96 97 99  Resp: 17 16 13    Temp:  (!) 97.4 F (36.3 C)    TempSrc:      SpO2: 93% 93% 94% 93%  Weight:      Height:        Intake/Output Summary (Last 24 hours) at 09/23/2017 1234 Last data filed at 09/23/2017 1138 Gross per 24 hour  Intake 1305.2 ml  Output 600 ml  Net 705.2 ml   Filed Weights   09/20/17 0500 09/21/17 0500 09/23/17 0236  Weight: 66.9 kg (147 lb 7.8 oz) 66.8 kg (147 lb 4.3 oz) 68.6 kg (151 lb 3.8  oz)    Examination:  General exam: Appears calm and comfortable  Respiratory system: Clear to auscultation. Respiratory effort normal. Cardiovascular system: S1 & S2 heard, RRR. No JVD, murmurs, rubs, gallops or clicks. No pedal edema. Gastrointestinal system: Abdomen is nondistended, soft and nontender. No organomegaly or masses felt. Normal bowel sounds heard. Central nervous system: Alert and oriented. No focal neurological deficits. Extremities: Symmetric  Skin: No rashes, lesions or ulcers Psychiatry: Judgement and insight appear normal. Mood & affect appropriate.   Data Reviewed: I have personally reviewed following labs and  imaging studies  CBC: Recent Labs  Lab 09/18/17 1431 09/19/17 0314 09/20/17 0320 09/21/17 0310 09/22/17 0313 09/23/17 0418  WBC 9.2 10.0 10.1 10.3 12.2* 12.3*  NEUTROABS 4.9  --   --   --   --   --   HGB 10.9* 11.6* 11.0* 11.0* 10.9* 10.0*  HCT 35.6* 37.1 34.9* 35.6* 35.3* 32.9*  MCV 86.2 83.7 83.1 83.6 83.8 84.1  PLT 350 329 305 290 296 354   Basic Metabolic Panel: Recent Labs  Lab 09/19/17 0314 09/20/17 0320 09/21/17 0310 09/22/17 0313 09/23/17 0418  NA 139 138 136 137 135  K 3.2* 3.7 4.1 3.7 3.9  CL 105 107 104 103 102  CO2 25 22 23 25 26   GLUCOSE 142* 108* 107* 116* 116*  BUN 18 16 16 15 17   CREATININE 0.75 0.70 0.67 0.76 0.75  CALCIUM 8.8* 8.5* 8.5* 8.6* 8.4*   GFR: Estimated Creatinine Clearance: 45 mL/min (by C-G formula based on SCr of 0.75 mg/dL). Liver Function Tests: Recent Labs  Lab 09/18/17 1431 09/19/17 0314  AST 22 23  ALT 12* 12*  ALKPHOS 86 87  BILITOT 0.6 0.3  PROT 6.7 6.8  ALBUMIN 3.3* 3.2*   No results for input(s): LIPASE, AMYLASE in the last 168 hours. No results for input(s): AMMONIA in the last 168 hours. Coagulation Profile: Recent Labs  Lab 09/23/17 0418  INR 1.13   Cardiac Enzymes: No results for input(s): CKTOTAL, CKMB, CKMBINDEX, TROPONINI in the last 168 hours. BNP (last 3 results) No results for input(s): PROBNP in the last 8760 hours. HbA1C: No results for input(s): HGBA1C in the last 72 hours. CBG: No results for input(s): GLUCAP in the last 168 hours. Lipid Profile: No results for input(s): CHOL, HDL, LDLCALC, TRIG, CHOLHDL, LDLDIRECT in the last 72 hours. Thyroid Function Tests: No results for input(s): TSH, T4TOTAL, FREET4, T3FREE, THYROIDAB in the last 72 hours. Anemia Panel: No results for input(s): VITAMINB12, FOLATE, FERRITIN, TIBC, IRON, RETICCTPCT in the last 72 hours. Sepsis Labs: No results for input(s): PROCALCITON, LATICACIDVEN in the last 168 hours.  Recent Results (from the past 240 hour(s))    Surgical pcr screen     Status: Abnormal   Collection Time: 09/23/17 12:27 AM  Result Value Ref Range Status   MRSA, PCR NEGATIVE NEGATIVE Final   Staphylococcus aureus POSITIVE (A) NEGATIVE Final    Comment: (NOTE) The Xpert SA Assay (FDA approved for NASAL specimens in patients 9 years of age and older), is one component of a comprehensive surveillance program. It is not intended to diagnose infection nor to guide or monitor treatment.        Radiology Studies: No results found.    Scheduled Meds: . diltiazem  60 mg Oral BID  . [START ON 09/24/2017] docusate sodium  100 mg Oral Daily  . DULoxetine  60 mg Oral BID  . lidocaine  3 patch Transdermal Q0600  . metoprolol tartrate  50 mg Oral BID  . mometasone-formoterol  2 puff Inhalation BID  . pantoprazole  40 mg Oral Daily  . pyridoxine  200 mg Oral Daily  . vitamin B-12  1,000 mcg Oral Daily   Continuous Infusions: . sodium chloride    . sodium chloride    . sodium chloride    . heparin    . magnesium sulfate 1 - 4 g bolus IVPB    . vancomycin    . vancomycin       LOS: 5 days    Time spent: 20 minutes   Dessa Phi, DO [facility] [email or web address] Password [facility] 09/23/2017, 12:34 PM

## 2017-09-24 ENCOUNTER — Encounter (HOSPITAL_COMMUNITY): Payer: Self-pay | Admitting: Vascular Surgery

## 2017-09-24 DIAGNOSIS — I724 Aneurysm of artery of lower extremity: Principal | ICD-10-CM

## 2017-09-24 DIAGNOSIS — R296 Repeated falls: Secondary | ICD-10-CM

## 2017-09-24 DIAGNOSIS — R262 Difficulty in walking, not elsewhere classified: Secondary | ICD-10-CM

## 2017-09-24 LAB — CBC
HCT: 33 % — ABNORMAL LOW (ref 36.0–46.0)
Hemoglobin: 10.4 g/dL — ABNORMAL LOW (ref 12.0–15.0)
MCH: 26.7 pg (ref 26.0–34.0)
MCHC: 31.5 g/dL (ref 30.0–36.0)
MCV: 84.6 fL (ref 78.0–100.0)
Platelets: 254 10*3/uL (ref 150–400)
RBC: 3.9 MIL/uL (ref 3.87–5.11)
RDW: 15.7 % — ABNORMAL HIGH (ref 11.5–15.5)
WBC: 13.6 10*3/uL — ABNORMAL HIGH (ref 4.0–10.5)

## 2017-09-24 LAB — BASIC METABOLIC PANEL
Anion gap: 8 (ref 5–15)
BUN: 17 mg/dL (ref 6–20)
CO2: 24 mmol/L (ref 22–32)
Calcium: 8.7 mg/dL — ABNORMAL LOW (ref 8.9–10.3)
Chloride: 103 mmol/L (ref 101–111)
Creatinine, Ser: 0.78 mg/dL (ref 0.44–1.00)
GFR calc Af Amer: >60 mL/min (ref >60)
GFR calc non Af Amer: >60 mL/min (ref >60)
Glucose, Bld: 132 mg/dL — ABNORMAL HIGH (ref 65–99)
Potassium: 4.5 mmol/L (ref 3.5–5.1)
Sodium: 135 mmol/L (ref 135–145)

## 2017-09-24 LAB — HEPARIN LEVEL (UNFRACTIONATED): Heparin Unfractionated: 0.26 IU/mL — ABNORMAL LOW (ref 0.30–0.70)

## 2017-09-24 MED ORDER — RIVAROXABAN 15 MG PO TABS
15.0000 mg | ORAL_TABLET | Freq: Every day | ORAL | Status: DC
  Administered 2017-09-24 – 2017-09-25 (×2): 15 mg via ORAL
  Filled 2017-09-24 (×2): qty 1

## 2017-09-24 NOTE — Evaluation (Signed)
Occupational Therapy Evaluation Patient Details Name: Sara Garcia MRN: [id number] DOB: 03-Apr-1928 Today's Date: 09/24/2017    History of Present Illness Sara Garcia is a 83 yo female with past medical history of hypertension, paroxysmal A. fib, CVA 07/2017, DVT, GERD, asthma who fell out of bed on August 2018 and suffered a left-sided hip fracture.  She underwent repair by Dr. Lyla Glassing at that time.  Since surgical intervention, she continued to have pain but not at the surgical site but more in her left groin.  This prompted a CT scan of the pelvis which revealed a pseudoaneurysm.  Orthopedic surgery consulted vascular surgery for intervention.   Clinical Impression   Pt with decline in function and safety with ADLs and ADL mobility with decreased strength, balance, endurance and safety awareness. Pt would benefit from acute OT services to address impairments to maximize level of function and safety    Follow Up Recommendations  SNF;Supervision/Assistance - 24 hour    Equipment Recommendations  None recommended by OT;Other (comment)(TBD at next venue of care)    Recommendations for Other Services       Precautions / Restrictions Precautions Precautions: Fall Restrictions Weight Bearing Restrictions: No      Mobility Bed Mobility Overal bed mobility: Needs Assistance Bed Mobility: Sit to Supine       Sit to supine: Mod assist;+2 for physical assistance   General bed mobility comments: multimodal cues to initiate bringing LEs to EOB, to use rails. Pt required physical asssist to elevate trunk  Transfers Overall transfer level: Needs assistance Equipment used: Rolling walker (2 wheeled) Transfers: Sit to/from Stand Sit to Stand: Mod assist;Min assist;+2 physical assistance;+2 safety/equipment         General transfer comment: cues for safety, posterior lean standing at EOB. Pt took hands off RW during standing/mobility and had LOB    Balance Overall balance assessment:  Needs assistance Sitting-balance support: Feet supported;No upper extremity supported Sitting balance-Leahy Scale: Fair     Standing balance support: Bilateral upper extremity supported;During functional activity Standing balance-Leahy Scale: Poor                             ADL either performed or assessed with clinical judgement   ADL Overall ADL's : Needs assistance/impaired Eating/Feeding: Set up;Sitting   Grooming: Wash/dry hands;Wash/dry face;Sitting;Min guard   Upper Body Bathing: Minimal assistance;Sitting   Lower Body Bathing: Maximal assistance   Upper Body Dressing : Minimal assistance;Sitting   Lower Body Dressing: Total assistance   Toilet Transfer: Minimal assistance;+2 for physical assistance;RW;Moderate assistance Toilet Transfer Details (indicate cue type and reason): siumlated to recliner, RN removed foley at start of session  Toileting- Clothing Manipulation and Hygiene: Total assistance;Sit to/from stand               Vision Baseline Vision/History: Wears glasses Wears Glasses: Reading only Patient Visual Report: No change from baseline       Perception     Praxis      Pertinent Vitals/Pain Pain Assessment: 0-10 Pain Score: 5  Pain Location: surgical site Pain Descriptors / Indicators: Constant Pain Intervention(s): Limited activity within patient's tolerance;Monitored during session;Repositioned;Relaxation     Hand Dominance Right   Extremity/Trunk Assessment Upper Extremity Assessment Upper Extremity Assessment: Overall WFL for tasks assessed   Lower Extremity Assessment Lower Extremity Assessment: Defer to PT evaluation   Cervical / Trunk Assessment Cervical / Trunk Assessment: Kyphotic   Communication Communication Communication: No  difficulties   Cognition Arousal/Alertness: Awake/alert Behavior During Therapy: WFL for tasks assessed/performed Overall Cognitive Status: No family/caregiver present to determine  baseline cognitive functioning Area of Impairment: Memory;Safety/judgement                     Memory: Decreased short-term memory   Safety/Judgement: Decreased awareness of safety     General Comments: PLOF uncertain as pt had difficulty remembering when asked about her ADL function, level of assist and if using an AD for mobility   General Comments   pt very pleasant and cooperative    Exercises     Shoulder Instructions      Home Living Family/patient expects to be discharged to:: Private residence Living Arrangements: Spouse/significant other Available Help at Discharge: Family;Available 24 hours/day Type of Home: House Home Access: Ramped entrance;Stairs to enter Entrance Stairs-Number of Steps: 4 Entrance Stairs-Rails: Right;Left;Can reach both Home Layout: One level     Bathroom Shower/Tub: Teacher, early years/pre: Standard Bathroom Accessibility: Yes   Home Equipment: Environmental consultant - 2 wheels;Wheelchair - [employer];Shower seat   Additional Comments: Patient states ramp installed in her home      Prior Functioning/Environment Level of Independence: Needs assistance  Gait / Transfers Assistance Needed: Assisted by family for transfers, limited for taking steps due to recent left hip surgery ADL's / Homemaking Assistance Needed: Pt has aide every other day for approximately 1-2 hours who assists with bathing/dressing/grooming. Family assisting at all other times.    Comments: PLOF uncertain as pt had difficulty remembering when asked about her ADL function, level of assist and if using an AD for mobility. Info obtained from chart        OT Problem List: Decreased strength;Decreased activity tolerance;Decreased knowledge of use of DME or AE;Impaired balance (sitting and/or standing);Decreased safety awareness;Decreased knowledge of precautions;Pain      OT Treatment/Interventions: Self-care/ADL training;Therapeutic activities;DME and/or  AE instruction;Patient/family education    OT Goals(Current goals can be found in the care plan section) Acute Rehab OT Goals Patient Stated Goal: get well and go home OT Goal Formulation: With patient Time For Goal Achievement: 10/01/17 Potential to Achieve Goals: Good ADL Goals Pt Will Perform Grooming: with supervision;with set-up;sitting Pt Will Perform Upper Body Bathing: with min guard assist;with supervision;with set-up;sitting Pt Will Perform Lower Body Bathing: with mod assist;with caregiver independent in assisting;sit to/from stand Pt Will Perform Upper Body Dressing: with min guard assist;with supervision;with set-up;sitting Pt Will Transfer to Toilet: with min assist;bedside commode  OT Frequency: Min 2X/week   Barriers to D/C: Decreased caregiver support          Co-evaluation              AM-PAC PT "6 Clicks" Daily Activity     Outcome Measure Help from another person eating meals?: None Help from another person taking care of personal grooming?: A Little Help from another person toileting, which includes using toliet, bedpan, or urinal?: Total Help from another person bathing (including washing, rinsing, drying)?: Total Help from another person to put on and taking off regular upper body clothing?: A Little Help from another person to put on and taking off regular lower body clothing?: Total 6 Click Score: 13   End of Session Equipment Utilized During Treatment: Gait belt;Rolling walker Nurse Communication: Mobility status  Activity Tolerance: Patient tolerated treatment well Patient left: in chair;with call bell/phone within reach;with chair alarm set  OT Visit Diagnosis: Unsteadiness on feet (R26.81);Other abnormalities of gait  and mobility (R26.89);History of falling (Z91.81);Muscle weakness (generalized) (M62.81);Pain Pain - Right/Left: Left Pain - part of body: Leg                Time: 0929-5747 OT Time Calculation (min): 34 min Charges:  OT  General Charges $OT Visit: 1 Visit OT Evaluation $OT Eval Moderate Complexity: 1 Mod G-Codes: OT G-codes **NOT FOR INPATIENT CLASS** Functional Assessment Tool Used: AM-PAC 6 Clicks Daily Activity     Britt Bottom 09/24/2017, 1:23 PM

## 2017-09-24 NOTE — Progress Notes (Signed)
PROGRESS NOTE    Sara Garcia  WUJ:[id number] DOB: Dec 13, 1927 DOA: 09/18/2017 PCP: Doree Albee, MD     Brief Narrative:  Sara Garcia is a 83 yo female with past medical history of hypertension, paroxysmal A. fib, CVA 07/2017, DVT, GERD, asthma who fell out of bed on August 2018 and suffered a left-sided hip fracture.  She underwent repair by Dr. Lyla Glassing at that time.  Since surgical intervention, she continued to have pain but not at the surgical site but more in her left groin.  This prompted a CT scan of the pelvis which revealed a pseudoaneurysm.  Orthopedic surgery consulted vascular surgery for intervention.  POD #1 post repair of left femoral artery pseudoaneurysm and osteotomy of fragment of femur which penetrated the artery.  No acute events reported overnight. Pt seen and examined at her bedside. She is alert and oriented x 3. She denies any pain when she does not move. Place of incision at left groin appears clean with no sign of drainage.  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 -vascular surgery following -we appreciate recommendations  Paroxysmal A Fib -CHA2DS2-VASc 7 -Continue metoprolol, cardizem -Xarelto restarted today 09/24/17  Recent CVA -In 07/2017. Switched from Eliquis to [place]. -Xarelto restarted   Recurrent falls with Left hip fracture s/p repair on 06/11/17 -Followed by Dr. Lyla Glassing as outpatient   GERD -stable -Continue PPI  Chronic right middle and left lower lobe atelectasis  -Chronic opacities within the lungs dating back to 2016  Depression -stable -Continue cymbalta   Ambulatory dysfunction with recurrent falls -PT to evaluate and treat -PT recommends SNF for physical rehab   DVT prophylaxis: xarelto Code Status:  DNR Family Communication: with daughter on the phone all questions answered to her satisfaction Disposition Plan: will stay another midnight to continue current management.   Consultants:   Cardiology  Vascular surgery  Procedures:  -S/p repair pseudoaneurysm left femoral artery and osteotomy fragment of femur 12/11  Antimicrobials:  Anti-infectives (From admission, onward)   Start     Dose/Rate Route Frequency Ordered Stop   09/23/17 1930  vancomycin (VANCOCIN) IVPB 1000 mg/200 mL premix     1,000 mg 200 mL/hr over 60 Minutes Intravenous Every 12 hours 09/23/17 1211 09/24/17 0730   09/23/17 1210  clindamycin (CLEOCIN) IVPB 600 mg  Status:  Discontinued     600 mg 100 mL/hr over 30 Minutes Intravenous 30 min pre-op 09/23/17 1210 09/23/17 1217   09/23/17 0730  vancomycin (VANCOCIN) 1-5 GM/200ML-% IVPB    Comments:  Kerrie Pleasure   : cabinet override      09/23/17 0730 09/23/17 1944   09/23/17 0715  ceFAZolin (ANCEF) IVPB 2g/100 mL premix  Status:  Discontinued    Comments:  Send with pt to OR   2 g 200 mL/hr over 30 Minutes Intravenous To Surgery 09/22/17 1452 09/23/17 1153       Subjective: Seen post-operatively, complaining of some nausea but no other issues  Objective: Vitals:   09/23/17 2000 09/23/17 2120 09/24/17 0500 09/24/17 0653  BP: 127/69   (!) 119/97  Pulse:    73  Resp:    17  Temp:    97.7 F (36.5 C)  TempSrc:    Oral  SpO2:  98%  98%  Weight:  66.2 kg (146 lb)   Height:        Intake/Output Summary (Last 24 hours) at 09/24/2017 0802 Last data filed at 09/24/2017 0400 Gross per 24 hour  Intake 1419.87 ml  Output 2325 ml  Net -905.13 ml   Filed Weights   09/21/17 0500 09/23/17 0236 09/24/17 0500  Weight: 66.8 kg (147 lb 4.3 oz) 67.1 kg (148 lb) 66.2 kg (146 lb)    Examination:  General exam: Appears calm and comfortable. A&O x 3 Respiratory system: Clear to auscultation. Respiratory effort normal. Cardiovascular system: S1 & S2 heard,  RRR. No JVD, murmurs, rubs, gallops or clicks. No pedal edema. Gastrointestinal system: Abdomen is nondistended, soft and nontender. No organomegaly or masses felt. Normal bowel sounds heard. Central nervous system: Alert and oriented. No focal neurological deficits. Extremities: Symmetric  Skin: No rashes, lesions or ulcers. Site of incision at left groin has sutures that appear clean and dry. Psychiatry: Judgement and insight appear normal. Mood & affect appropriate.   Data Reviewed: I have personally reviewed following labs and imaging studies  CBC: Recent Labs  Lab 09/18/17 1431  09/20/17 0320 09/21/17 0310 09/22/17 0313 09/23/17 0418 09/24/17 0230  WBC 9.2   < > 10.1 10.3 12.2* 12.3* 13.6*  NEUTROABS 4.9  --   --   --   --   --   --   HGB 10.9*   < > 11.0* 11.0* 10.9* 10.0* 10.4*  HCT 35.6*   < > 34.9* 35.6* 35.3* 32.9* 33.0*  MCV 86.2   < > 83.1 83.6 83.8 84.1 84.6  PLT 350   < > 305 290 296 237 254   < > = values in this interval not displayed.   Basic Metabolic Panel: Recent Labs  Lab 09/20/17 0320 09/21/17 0310 09/22/17 0313 09/23/17 0418 09/24/17 0230  NA 138 136 137 135 135  K 3.7 4.1 3.7 3.9 4.5  CL 107 104 103 102 103  CO2 22 23 25 26 24   GLUCOSE 108* 107* 116* 116* 132*  BUN 16 16 15 17 17   CREATININE 0.70 0.67 0.76 0.75 0.78  CALCIUM 8.5* 8.5* 8.6* 8.4* 8.7*   GFR: Estimated Creatinine Clearance: 44.2 mL/min (by C-G formula based on SCr of 0.78 mg/dL). Liver Function Tests: Recent Labs  Lab 09/18/17 1431 09/19/17 0314  AST 22 23  ALT 12* 12*  ALKPHOS 86 87  BILITOT 0.6 0.3  PROT 6.7 6.8  ALBUMIN 3.3* 3.2*   No results for input(s): LIPASE, AMYLASE in the last 168 hours. No results for input(s): AMMONIA in the last 168 hours. Coagulation Profile: Recent Labs  Lab 09/23/17 0418  INR 1.13   Cardiac Enzymes: No results for input(s): CKTOTAL, CKMB, CKMBINDEX, TROPONINI in the last 168 hours. BNP (last 3 results) No results for input(s):  PROBNP in the last 8760 hours. HbA1C: No results for input(s): HGBA1C in the last 72 hours. CBG: No results for input(s): GLUCAP in the last 168 hours. Lipid Profile: No results for input(s): CHOL, HDL, LDLCALC, TRIG, CHOLHDL, LDLDIRECT in the last 72 hours. Thyroid Function Tests: No results for input(s): TSH, T4TOTAL, FREET4, T3FREE, THYROIDAB in the last 72 hours. Anemia Panel: No results for input(s): VITAMINB12, FOLATE, FERRITIN, TIBC, IRON, RETICCTPCT in the last 72 hours. Sepsis Labs: No results for input(s): PROCALCITON, LATICACIDVEN in the last 168 hours.  Recent Results (from the past 240 hour(s))  Surgical pcr screen     Status: Abnormal   Collection Time: 09/23/17 12:27 AM  Result Value Ref Range Status   MRSA, PCR NEGATIVE NEGATIVE Final   Staphylococcus aureus POSITIVE (A) NEGATIVE Final    Comment: (NOTE) The Xpert SA Assay (FDA approved for NASAL specimens in patients 18 years of age and older), is one component of a comprehensive surveillance program. It is not intended to diagnose infection nor to guide or monitor treatment.        Radiology Studies: No results found.    Scheduled Meds: . diltiazem  60 mg Oral BID  . docusate sodium  100 mg Oral Daily  . DULoxetine  60 mg Oral BID  . lidocaine  3 patch Transdermal Q0600  . metoprolol tartrate  50 mg Oral BID  . mometasone-formoterol  2 puff Inhalation BID  . pantoprazole  40 mg Oral Daily  . pyridoxine  200 mg Oral Daily  . vitamin B-12  1,000 mcg Oral Daily   Continuous Infusions: . sodium chloride Stopped (09/23/17 1303)  . sodium chloride    . sodium chloride    . heparin 1,300 Units/hr (09/24/17 0320)  . magnesium sulfate 1 - 4 g bolus IVPB       LOS: 6 days    Time spent: 20 minutes   Kayleen Memos, DO [facility] [email or web address] Password [email or web address] 09/24/2017, 8:02 AM

## 2017-09-24 NOTE — Plan of Care (Signed)
  Progressing Activity: Risk for activity intolerance will decrease 09/24/2017 1222 - Progressing by Marice Potter, RN Nutrition: Adequate nutrition will be maintained 09/24/2017 1222 - Progressing by Marice Potter, RN Pain Managment: General experience of comfort will improve 09/24/2017 1222 - Progressing by Marice Potter, RN

## 2017-09-24 NOTE — Clinical Social Work Note (Signed)
Clinical Social Work Assessment  Patient Details  Name: Sara Garcia MRN: [id number] Date of Birth: 1928/08/15  Date of referral:  09/24/17               Reason for consult:  Facility Placement                Permission sought to share information with:  Facility Sport and exercise psychologist Permission granted to share information::     Name::     Chiropodist::  SNFs  Relationship::  Daughter  Contact Information:  [phone]  Housing/Transportation Living arrangements for the past 2 months:  [place] of Information:  Patient, Adult Children Patient Interpreter Needed:  None Criminal Activity/Legal Involvement Pertinent to Current Situation/Hospitalization:  No - Comment as needed Significant Relationships:  Adult Children Lives with:  Adult Children Do you feel safe going back to the place where you live?  No Need for family participation in patient care:  No (Coment)  Care giving concerns:  CSW received consult for possible SNF placement at time of discharge. CSW spoke with patient and her daughter, [name], regarding PT recommendation of SNF placement at time of discharge. Patient's daughter reported that she is currently unable to care for patient at their home given patient's current physical needs and fall risk and because [name] has a broken foot. Patient expressed understanding of PT recommendation and is agreeable to SNF placement at time of discharge. CSW to continue to follow and assist with discharge planning needs.   Social Worker assessment / plan:  CSW spoke with patient and her daughter concerning possibility of rehab at [facility] before returning home.  Employment status:  Retired Nurse, adult PT Recommendations:  [place] / Referral to community resources:  [place]  Patient/Family's Response to care:  Patient recognizes need for rehab before returning home and is agreeable to a SNF in  [place]. Patient reported preference for [facility]. They reported understanding of possible barriers such as requiring insurance authorization.   Patient/Family's Understanding of and Emotional Response to Diagnosis, Current Treatment, and Prognosis:  Patient/family is realistic regarding therapy needs and expressed being hopeful for SNF placement. Patient expressed understanding of CSW role and discharge process as well as medical condition. No questions/concerns about plan or treatment.    Emotional Assessment Appearance:  Appears stated age Attitude/Demeanor/Rapport:  Other(Appropriate) Affect (typically observed):  Accepting, Appropriate, Pleasant Orientation:  Oriented to Self, Oriented to Situation, Oriented to Place, Oriented to  Time Alcohol / Substance use:  Not Applicable Psych involvement (Current and /or in the community):  No (Comment)  Discharge Needs  Concerns to be addressed:  Care Coordination Readmission within the last 30 days:  No Current discharge risk:  None Barriers to Discharge:  Continued Medical Work up   [employer], [place] 09/24/2017, 10:50 AM

## 2017-09-24 NOTE — Progress Notes (Addendum)
[place]

## 2017-09-24 NOTE — Progress Notes (Signed)
[place] for Heparin . Indication: atrial fibrillation  Allergies  Allergen Reactions  . Latex Itching and Rash  . Adhesive [Tape] Other (See Comments)    Tears skin  . Betadine [Povidone Iodine]     blisters  . Ciprofloxacin Nausea And Vomiting  . Codeine Nausea And Vomiting    Upsets stomach, nightmares  . Penicillins Hives    Has patient had a PCN reaction causing immediate rash, facial/tongue/throat swelling, SOB or lightheadedness with hypotension: yes Has patient had a PCN reaction causing severe rash involving mucus membranes or skin necrosis: yes Has patient had a PCN reaction that required hospitalization: no Has patient had a PCN reaction occurring within the last 10 years: no If all of the above answers are "NO", then may proceed with Cephalosporin use.   . Sulfa Antibiotics Hives  . Wellbutrin [Bupropion]     Reaction unknown   Patient Measurements: Height: 5\' 2"  (157.5 cm) Weight: 151 lb 3.8 oz (68.6 kg) IBW/kg (Calculated) : 50.1  Heparin dosing weight: 63 kg  Vital Signs: Temp: 97.7 F (36.5 C) (12/11 1750) Temp Source: Oral (12/11 1750) BP: 127/69 (12/11 2000) Pulse Rate: 74 (12/11 1900)  Labs: Recent Labs    09/22/17 0313 09/23/17 0418 09/24/17 0230  HGB 10.9* 10.0* 10.4*  HCT 35.3* 32.9* 33.0*  PLT 296 237 254  LABPROT  --  14.4  --   INR  --  1.13  --   HEPARINUNFRC 0.25* 0.26* 0.26*  CREATININE 0.76 0.75  --     Estimated Creatinine Clearance: 45 mL/min (by C-G formula based on SCr of 0.75 mg/dL).  Assessment: 83 y.o. female with h/o Afib, Xarelto on hold s/p pesudoaneurysm, for heparin Goal of Therapy:  Heparin level 0.3-0.7 units/mL Monitor platelets by anticoagulation protocol: Yes   Plan:  Increase Heparin 1300 units/hr  Phillis Knack, PharmD, BCPS

## 2017-09-24 NOTE — NC FL2 (Signed)
[place] LEVEL OF CARE SCREENING TOOL     IDENTIFICATION  Patient Name: Sara Garcia Birthdate: 04-Aug-1928 Sex: female Admission Date (Current Location): 09/18/2017  [facility] and [place] Number:  [employer] and Address:  [facility] [place], [place] [address] [place], [place] [address]      Provider Number: [id number]  Attending Physician Name and Address:  Kayleen Memos, DO  Relative Name and Phone Number:  [name] daughter, [phone]    Current Level of Care: Hospital Recommended Level of Care: [place] Prior Approval Number:    Date Approved/Denied:   PASRR Number: [id number]  Discharge Plan: SNF    Current Diagnoses: Patient Active Problem List   Diagnosis Date Noted  . 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 Acute CVA (cerebrovascular accident) ([place]) 07/23/2017  . Dysarthria 07/21/2017  . Tachycardia 07/21/2017  . Closed comminuted intertrochanteric fracture of left femur ([place]) 06/11/2017  . Diarrhea 01/03/2016  . IDA (iron deficiency anemia) 11/09/2015  . Dysphagia, pharyngoesophageal phase   . Hiatal hernia   . Constipation 01/16/2015  . Esophageal dysphagia 06/27/2014  . GERD (gastroesophageal reflux disease) 06/27/2014  . Personal history of colonic polyps 06/27/2014  . Chronic anticoagulation 06/27/2014  . Primary osteoarthritis of right knee 05/09/2014  . DISH (diffuse idiopathic skeletal hyperostosis) 05/09/2014  . DDD (degenerative disc disease), lumbosacral 05/09/2014  . Prolapse of vaginal vault after hysterectomy 04/07/2014  . Atrial fibrillation ([place]) 01/10/2014  . Radiculopathy 01/10/2014  . Essential hypertension 12/21/2013    Orientation RESPIRATION BLADDER Height & Weight     Self, Time, Situation, Place  O2(Nasal cannula 2.5L) Incontinent, Indwelling catheter Weight: 66.2 kg (146 lb) Height:  5\' 2"  (157.5 cm)  BEHAVIORAL SYMPTOMS/MOOD NEUROLOGICAL BOWEL NUTRITION STATUS   Continent Diet(Please see DC Summary)  AMBULATORY STATUS COMMUNICATION OF NEEDS Skin   Extensive Assist Verbally Surgical wounds(Closed incision on leg; closed system drain on groin)                       Personal Care Assistance Level of Assistance  Bathing, Feeding, Dressing Bathing Assistance: Maximum assistance Feeding assistance: Independent Dressing Assistance: Limited assistance     Functional Limitations Info             SPECIAL CARE FACTORS FREQUENCY  PT (By licensed PT)     PT Frequency: 5x/week              Contractures      Additional Factors Info  Code Status, Allergies, Psychotropic Code Status Info: DNR Allergies Info: Latex, Adhesive Tape, Betadine Povidone Iodine, Ciprofloxacin, Codeine, Penicillins, Sulfa Antibiotics, Wellbutrin Bupropion Psychotropic Info: Cymbalta         Current Medications (09/24/2017):  This is the current hospital active medication list Current Facility-Administered Medications  Medication Dose Route Frequency Provider Last Rate Last Dose  . 0.9 %  sodium chloride infusion   Intravenous Once Carney Living, CRNA   Stopped at 09/23/17 1303  . 0.9 %  sodium chloride infusion  500 mL Intravenous Once PRN Laurence Slate M, PA-C      . 0.9 %  sodium chloride infusion   Intravenous Continuous Collins, Angellee M, PA-C      . acetaminophen (TYLENOL) tablet 650 mg  650 mg Oral Q6H PRN Jani Gravel, MD   650 mg at 09/23/17 1346   Or  . acetaminophen (TYLENOL) suppository 650 mg  650 mg Rectal Q6H PRN Jani Gravel, MD      .  alum & mag hydroxide-simeth (MAALOX/MYLANTA) 200-200-20 MG/5ML suspension 15-30 mL  15-30 mL Oral Q2H PRN Laurence Slate M, PA-C      . diltiazem (CARDIZEM) tablet 60 mg  60 mg Oral BID Jani Gravel, MD   60 mg at 09/24/17 0934  . docusate sodium (COLACE) capsule 100 mg  100 mg Oral Daily Sola, Margolis M, PA-C   100 mg at 09/24/17 7867  . DULoxetine (CYMBALTA) DR capsule 60 mg  60 mg Oral BID Jani Gravel, MD   60 mg at  09/24/17 0934  . guaiFENesin-dextromethorphan (ROBITUSSIN DM) 100-10 MG/5ML syrup 15 mL  15 mL Oral Q4H PRN Laurence Slate M, PA-C      . hydrALAZINE (APRESOLINE) injection 10 mg  10 mg Intravenous Q6H PRN Jani Gravel, MD   10 mg at 09/19/17 0029  . labetalol (NORMODYNE,TRANDATE) injection 10 mg  10 mg Intravenous Q10 min PRN Ulyses Amor, PA-C      . lidocaine (LIDODERM) 5 % 3 patch  3 patch Transdermal Q0600 Rise Patience, MD   3 patch at 09/24/17 [phone]  . magnesium sulfate IVPB 2 g 50 mL  2 g Intravenous Daily PRN Laurence Slate M, PA-C      . metoprolol tartrate (LOPRESSOR) injection 2-5 mg  2-5 mg Intravenous Q2H PRN Laurence Slate M, PA-C      . metoprolol tartrate (LOPRESSOR) tablet 50 mg  50 mg Oral BID Dessa Phi, DO   50 mg at 09/24/17 0934  . mometasone-formoterol (DULERA) 200-5 MCG/ACT inhaler 2 puff  2 puff Inhalation BID Jani Gravel, MD   2 puff at 09/24/17 0901  . morphine 2 MG/ML injection 1 mg  1 mg Intravenous Q4H PRN Rise Patience, MD   1 mg at 09/23/17 0118  . ondansetron (ZOFRAN) injection 4 mg  4 mg Intravenous Q6H PRN Jani Gravel, MD   4 mg at 09/23/17 1319  . oxyCODONE (Oxy IR/ROXICODONE) immediate release tablet 5 mg  5 mg Oral Q4H PRN Gardiner Barefoot, NP   5 mg at 09/24/17 0325  . pantoprazole (PROTONIX) EC tablet 40 mg  40 mg Oral Daily Jani Gravel, MD   40 mg at 09/24/17 0934  . phenol (CHLORASEPTIC) mouth spray 1 spray  1 spray Mouth/Throat PRN Laurence Slate M, PA-C      . polyethylene glycol (MIRALAX / GLYCOLAX) packet 17 g  17 g Oral Daily PRN Jani Gravel, MD   17 g at 09/23/17 2142  . potassium chloride SA (K-DUR,KLOR-CON) CR tablet 20-40 mEq  20-40 mEq Oral Daily PRN Laurence Slate M, PA-C      . pyridOXINE (VITAMIN B-6) tablet 200 mg  200 mg Oral Daily Jani Gravel, MD   200 mg at 09/23/17 1331  . Rivaroxaban (XARELTO) tablet 15 mg  15 mg Oral Q supper Hall, Carole N, DO      . vitamin B-12 (CYANOCOBALAMIN) tablet 1,000 mcg  1,000 mcg Oral Daily Jani Gravel, MD   1,000 mcg at 09/24/17 9470     Discharge Medications: Please see discharge summary for a list of discharge medications.  Relevant Imaging Results:  Relevant Lab Results:   Additional Information SS# [id number]  Benard Halsted, LCSWA

## 2017-09-24 NOTE — Care Management Important Message (Signed)
Important Message  Patient Details  Name: Sara Garcia MRN: [id number] Date of Birth: [birth year]   Medicare Important Message Given:  Yes    Madeliene Tejera 09/24/2017, 2:17 PM

## 2017-09-24 NOTE — Evaluation (Signed)
Physical Therapy Evaluation Patient Details Name: Sara Garcia MRN: [id number] DOB: 05-04-1928 Today's Date: 09/24/2017   History of Present Illness  Pt is a 83 yo female with PMH of HTN, paroxysmal A. fib, CVA 07/2017, DVT, GERD, asthma who fell out of bed on August 2018 and suffered L hip fx; underwent repair by Dr. Lyla Glassing at that time. Since surgical intervention, she continued to have pain; pelvic CT scan revealed a pseudoaneurysm. Orthopedic sx consulted vascular sx for intervention. 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    Clinical Impression  Pt presents with pain, decreased safety awareness, decreased activity tolerance, and an overall decrease in functional mobility secondary to above. PTA, pt lived at home with husband, requiring assist for amb and ADLs; pt poor historian, so unsure of timeline when she began needing more assist for mobility. Able to amb with minA (+2 safety), but limited secondary to increased RLE pain. Pt would benefit from continued acute PT services to maximize functional mobility and independence prior to d/c with SNF-level therapies.     Follow Up Recommendations SNF;Supervision/Assistance - 24 hour    Equipment Recommendations  Other (comment)(TBD next venue)    Recommendations for Other Services       Precautions / Restrictions Precautions Precautions: Fall Restrictions Weight Bearing Restrictions: No      Mobility  Bed Mobility Overal bed mobility: Needs Assistance Bed Mobility: Sit to Supine       Sit to supine: Mod assist;+2 for physical assistance   General bed mobility comments: multimodal cues to initiate bringing LEs to EOB, to use rails. Pt required physical asssist to elevate trunk  Transfers Overall transfer level: Needs assistance Equipment used: Rolling walker (2 wheeled) Transfers: Sit to/from Stand Sit to Stand: Mod assist;Min assist;+2 physical assistance;+2 safety/equipment         General  transfer comment: cues for safety, posterior lean standing at EOB. Pt took hands off RW during standing/mobility and had LOB  Ambulation/Gait Ambulation/Gait assistance: Min assist;+2 safety/equipment Ambulation Distance (Feet): 5 Feet Assistive device: Rolling walker (2 wheeled) Gait Pattern/deviations: Step-to pattern;Decreased weight shift to right;Antalgic;Shuffle;Leaning posteriorly Gait velocity: Decreased Gait velocity interpretation: <1.8 ft/sec, indicative of risk for recurrent falls General Gait Details: Pt with slow, unsteady gait using RW and minA for balance. Intermittently taking hands of RW requiring modA to correct posterior LOB. Cues for closer proximity to RW and guiding RW  Stairs            Wheelchair Mobility    Modified Rankin (Stroke Patients Only)       Balance Overall balance assessment: Needs assistance Sitting-balance support: Feet supported;No upper extremity supported Sitting balance-Leahy Scale: Fair     Standing balance support: Bilateral upper extremity supported;During functional activity Standing balance-Leahy Scale: Poor                               Pertinent Vitals/Pain Pain Assessment: 0-10 Pain Score: 5  Pain Location: surgical site Pain Descriptors / Indicators: Constant Pain Intervention(s): Limited activity within patient's tolerance;Monitored during session;Repositioned    Home Living Family/patient expects to be discharged to:: Skilled nursing facility Living Arrangements: Spouse/significant other Available Help at Discharge: Family;Available 24 hours/day Type of Home: House Home Access: Ramped entrance;Stairs to enter Entrance Stairs-Rails: Right;Left;Can reach both Entrance Stairs-Number of Steps: 4 Home Layout: One level Home Equipment: Walker - 2 wheels;Wheelchair - [employer];Shower seat Additional Comments: Patient states ramp installed  in her home    Prior Function Level of Independence:  Needs assistance   Gait / Transfers Assistance Needed: Assisted by family for transfers, limited for taking steps due to recent left hip surgery. Supposedly had HHPT after hip sx in 05/2017  ADL's / Homemaking Assistance Needed: Pt has aide every other day for approximately 1-2 hours who assists with bathing/dressing/grooming. Family assisting at all other times.   Comments: PLOF uncertain as pt had difficulty remembering when asked about her ADL function, level of assist and if using an AD for mobility. Info obtained from chart     Hand Dominance   Dominant Hand: Right    Extremity/Trunk Assessment   Upper Extremity Assessment Upper Extremity Assessment: Overall WFL for tasks assessed    Lower Extremity Assessment Lower Extremity Assessment: Generalized weakness    Cervical / Trunk Assessment Cervical / Trunk Assessment: Kyphotic  Communication   Communication: No difficulties  Cognition Arousal/Alertness: Awake/alert Behavior During Therapy: WFL for tasks assessed/performed Overall Cognitive Status: No family/caregiver present to determine baseline cognitive functioning Area of Impairment: Memory;Safety/judgement                     Memory: Decreased short-term memory   Safety/Judgement: Decreased awareness of safety     General Comments: PLOF uncertain as pt had difficulty remembering when asked about her ADL function, level of assist and if using an AD for mobility      General Comments General comments (skin integrity, edema, etc.): SpO2 91-95% on RA throughout (RN notified)    Exercises     Assessment/Plan    PT Assessment Patient needs continued PT services  PT Problem List Decreased strength;Decreased activity tolerance;Decreased balance;Decreased mobility;Decreased safety awareness;Pain;Decreased knowledge of use of DME       PT Treatment Interventions DME instruction;Gait training;Stair training;Functional mobility training;Therapeutic  activities;Therapeutic exercise;Balance training;Patient/family education    PT Goals (Current goals can be found in the Care Plan section)  Acute Rehab PT Goals Patient Stated Goal: get well and go home PT Goal Formulation: With patient Time For Goal Achievement: 10/08/17 Potential to Achieve Goals: Good    Frequency Min 2X/week   Barriers to discharge Decreased caregiver support Pt reports husband cannot provide amount of assist needed     Co-evaluation PT/OT/SLP Co-Evaluation/Treatment: Yes Reason for Co-Treatment: Necessary to address cognition/behavior during functional activity;For patient/therapist safety;To address functional/ADL transfers PT goals addressed during session: Mobility/safety with mobility;Balance;Proper use of DME         AM-PAC PT "6 Clicks" Daily Activity  Outcome Measure Difficulty turning over in bed (including adjusting bedclothes, sheets and blankets)?: Unable Difficulty moving from lying on back to sitting on the side of the bed? : Unable Difficulty sitting down on and standing up from a chair with arms (e.g., wheelchair, bedside commode, etc,.)?: Unable Help needed moving to and from a bed to chair (including a wheelchair)?: A Little Help needed walking in hospital room?: A Little Help needed climbing 3-5 steps with a railing? : A Lot 6 Click Score: 11    End of Session Equipment Utilized During Treatment: Gait belt Activity Tolerance: Patient tolerated treatment well Patient left: in chair;with call bell/phone within reach;with chair alarm set Nurse Communication: Mobility status PT Visit Diagnosis: Other abnormalities of gait and mobility (R26.89)    Time: 3267-1245 PT Time Calculation (min) (ACUTE ONLY): 34 min   Charges:   PT Evaluation $PT Eval Moderate Complexity: 1 Mod     PT G Codes:  Mabeline Caras, PT, DPT Acute Rehab Services  Pager: [place] 09/24/2017, 2:06 PM

## 2017-09-24 NOTE — Progress Notes (Signed)
Pt foley catheter removed 1330 prior to working with PT

## 2017-09-24 NOTE — Progress Notes (Signed)
   VASCULAR SURGERY ASSESSMENT & PLAN:   1 Day Post-Op s/p: Repair of left femoral artery pseudoaneurysm and osteotomy of fragment of femur which had penetrated the artery.  JP had 50 cc last shift.  Would leave this today.  Hopefully this can come out tomorrow.  Okay to begin physical therapy.  Possibly home tomorrow if she is able to ambulate and we can get the drain out.  She can resume her oral anticoagulation.   SUBJECTIVE:   No complaints  PHYSICAL EXAM:   Vitals:   09/23/17 1900 09/23/17 2000 09/23/17 2120 09/24/17 0500  BP: (!) 108/57 127/69    Pulse: 74     Resp: 17     Temp:      TempSrc:      SpO2:   98%   Weight:    146 lb (66.2 kg)  Height:       Left groin incision looks fine. JP 50 cc last shift. Palpable left dorsalis pedis pulse.  LABS:   Lab Results  Component Value Date   WBC 13.6 (H) 09/24/2017   HGB 10.4 (L) 09/24/2017   HCT 33.0 (L) 09/24/2017   MCV 84.6 09/24/2017   PLT 254 09/24/2017   Lab Results  Component Value Date   CREATININE 0.78 09/24/2017   Lab Results  Component Value Date   INR 1.13 09/23/2017    PROBLEM LIST:    Principal Problem:   Pseudoaneurysm ([place]) Active Problems:   Atrial fibrillation ([facility])   CURRENT MEDS:   . diltiazem  60 mg Oral BID  . docusate sodium  100 mg Oral Daily  . DULoxetine  60 mg Oral BID  . lidocaine  3 patch Transdermal Q0600  . metoprolol tartrate  50 mg Oral BID  . mometasone-formoterol  2 puff Inhalation BID  . pantoprazole  40 mg Oral Daily  . pyridoxine  200 mg Oral Daily  . vitamin B-12  1,000 mcg Oral Daily    [name] Beeper: [phone] Office: [phone] 09/24/2017

## 2017-09-25 LAB — CBC
HCT: 31.8 % — ABNORMAL LOW (ref 36.0–46.0)
Hemoglobin: 9.9 g/dL — ABNORMAL LOW (ref 12.0–15.0)
MCH: 26.2 pg (ref 26.0–34.0)
MCHC: 31.1 g/dL (ref 30.0–36.0)
MCV: 84.1 fL (ref 78.0–100.0)
Platelets: 256 10*3/uL (ref 150–400)
RBC: 3.78 MIL/uL — ABNORMAL LOW (ref 3.87–5.11)
RDW: 15.6 % — ABNORMAL HIGH (ref 11.5–15.5)
WBC: 11.3 10*3/uL — ABNORMAL HIGH (ref 4.0–10.5)

## 2017-09-25 LAB — BASIC METABOLIC PANEL
Anion gap: 10 (ref 5–15)
BUN: 21 mg/dL — ABNORMAL HIGH (ref 6–20)
CO2: 25 mmol/L (ref 22–32)
Calcium: 8.4 mg/dL — ABNORMAL LOW (ref 8.9–10.3)
Chloride: 102 mmol/L (ref 101–111)
Creatinine, Ser: 0.77 mg/dL (ref 0.44–1.00)
GFR calc Af Amer: >60 mL/min (ref >60)
GFR calc non Af Amer: >60 mL/min (ref >60)
Glucose, Bld: 121 mg/dL — ABNORMAL HIGH (ref 65–99)
Potassium: 4 mmol/L (ref 3.5–5.1)
Sodium: 137 mmol/L (ref 135–145)

## 2017-09-25 NOTE — Discharge Summary (Addendum)
Discharge Summary  Sara Garcia YKZ:[id number] DOB: Sep 26, 1928  PCP: Doree Albee, MD  Admit date: 09/18/2017 Discharge date: 09/25/2017  Time spent: 25 MINUTES  Recommendations for Outpatient Follow-up:  1. Follow up with vascular surgery within 2 weeks. 2. Follow up with PCP within a week 3. Take your medications as prescribed 4. Please change JP drain daily and record output  Discharge Diagnoses:  Active Hospital Problems   Diagnosis Date Noted  . 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 Atrial fibrillation ([place]) 01/10/2014    Resolved Hospital Problems  No resolved problems to display.    Discharge Condition: Stable  Diet recommendation: Resume previous diet  Vitals:   09/25/17 0552 09/25/17 1022  BP: (!) 130/58   Pulse: 64 76  Resp: 17 18  Temp: 98.4 F (36.9 C)   SpO2: 94% 95%    History of present illness:  Sara Garcia is a 83 yo female with past medical history of hypertension, paroxysmal A. fib, CVA 07/2017, DVT, GERD, asthma who fell out of bed on August 2018 and suffered a left-sided hip fracture.  She underwent repair by Dr. Lyla Glassing at that time.  Since surgical intervention, she continued to have pain but not at the surgical site but more in her left groin.  This prompted a CT scan of the pelvis which revealed a pseudoaneurysm.  Orthopedic surgery consulted vascular surgery for intervention.  POD #2 post repair of left femoral artery pseudoaneurysm and osteotomy of fragment of femur which penetrated the artery, clinically undetermined. JP drained 100 cc last shift. Suspect will continue to drain as she becomes more active. Place of incision at left groin appears clean with no sign of drainage.  On the day of discharge the patient was hemodynamically stable. She will need to have JP drained daily at the nursing home, they will need to record output. Patient will need to follow up with vascular surgery in 2 weeks Dr. Scot Dock. The office will call.   Hospital  Course:  Principal Problem:   Pseudoaneurysm [facility]) Active Problems:   Atrial fibrillation ([place])   Procedures: POD #2 post repair of left femoral artery pseudoaneurysm and osteotomy of fragment of femur which penetrated the artery, clinically undetermined.   Consultations:  Vascular surgery  Discharge Exam: BP (!) 130/58 (BP Location: Left Arm)   Pulse 76   Temp 98.4 F (36.9 C) (Oral)   Resp 18   Ht 5\' 2"  (1.575 m)   Wt 53.1 kg (117 lb)   SpO2 95%   BMI 21.40 kg/m   General: 83 yo CF WD WN NAD. A&O x 3  Cardiovascular: RRR no rubs or gallops Respiratory: CTA no wheezes or rales  Discharge Instructions You were cared for by a hospitalist during your hospital stay. If you have any questions about your discharge medications or the care you received while you were in the hospital after you are discharged, you can call the unit and asked to speak with the hospitalist on call if the hospitalist that took care of you is not available. Once you are discharged, your primary care physician will handle any further medical issues. Please note that NO REFILLS for any discharge medications will be authorized once you are discharged, as it is imperative that you return to your primary care physician (or establish a relationship with a primary care physician if you do not have one) for your aftercare needs so that they can reassess your need for medications and monitor your lab values.  Allergies as of 09/25/2017      Reactions   Latex Itching, Rash   Adhesive [tape] Other (See Comments)   Tears skin   Betadine [povidone Iodine]    blisters   Ciprofloxacin Nausea And Vomiting   Codeine Nausea And Vomiting   Upsets stomach, nightmares   Penicillins Hives   Has patient had a PCN reaction causing immediate rash, facial/tongue/throat swelling, SOB or lightheadedness with hypotension: yes Has patient had a PCN reaction causing severe rash involving mucus membranes or skin necrosis: yes Has  patient had a PCN reaction that required hospitalization: no Has patient had a PCN reaction occurring within the last 10 years: no If all of the above answers are "NO", then may proceed with Cephalosporin use.   Sulfa Antibiotics Hives   Wellbutrin [bupropion]    Reaction unknown      Medication List    TAKE these medications   acetaminophen 500 MG tablet Commonly known as:  TYLENOL Take 1,000 mg by mouth every 6 (six) hours.   calcium-vitamin D 500-200 MG-UNIT tablet Take 1 tablet by mouth daily.   diltiazem 60 MG tablet Commonly known as:  CARDIZEM Take 1 tablet (60 mg total) 2 (two) times daily by mouth.   DULoxetine 60 MG capsule Commonly known as:  CYMBALTA Take 60 mg by mouth 2 (two) times daily.   esomeprazole 20 MG capsule Commonly known as:  NEXIUM TAKE 1 CAPSULE BY MOUTH ONCE DAILY AT NOON   Fluticasone-Salmeterol 250-50 MCG/DOSE Aepb Commonly known as:  ADVAIR Inhale 1 puff into the lungs daily.   metoprolol tartrate 25 MG tablet Commonly known as:  LOPRESSOR Take 1.5 tablets (37.5 mg total) by mouth 2 (two) times daily.   polyethylene glycol powder powder Commonly known as:  GLYCOLAX/MIRALAX MIX 17 GRAMS WITH LIQUID AND TAKE BY MOUTH ONCE DAILY What changed:  See the new instructions.   pyridoxine 200 MG tablet Commonly known as:  B-6 Take 200 mg by mouth daily.   Rivaroxaban 15 MG Tabs tablet Commonly known as:  XARELTO Take 1 tablet (15 mg total) by mouth daily with supper.   vitamin B-12 1000 MCG tablet Commonly known as:  CYANOCOBALAMIN Take 1,000 mcg by mouth daily.      Allergies  Allergen Reactions  . Latex Itching and Rash  . Adhesive [Tape] Other (See Comments)    Tears skin  . Betadine [Povidone Iodine]     blisters  . Ciprofloxacin Nausea And Vomiting  . Codeine Nausea And Vomiting    Upsets stomach, nightmares  . Penicillins Hives    Has patient had a PCN reaction causing immediate rash, facial/tongue/throat swelling, SOB or  lightheadedness with hypotension: yes Has patient had a PCN reaction causing severe rash involving mucus membranes or skin necrosis: yes Has patient had a PCN reaction that required hospitalization: no Has patient had a PCN reaction occurring within the last 10 years: no If all of the above answers are "NO", then may proceed with Cephalosporin use.   . Sulfa Antibiotics Hives  . Wellbutrin [Bupropion]     Reaction unknown   Contact information for after-discharge care    [facility] .   Service:  Skilled Nursing Contact information: [address] [place] [phone]               The results of significant diagnostics from this hospitalization (including imaging, microbiology, ancillary and laboratory) are listed below for reference.  Significant Diagnostic Studies: Dg Chest 2 View  Result Date: 09/19/2017 CLINICAL DATA:  Preoperative radiograph. EXAM: CHEST  2 VIEW COMPARISON:  07/21/2017 FINDINGS: Cardiomediastinal silhouette is normal. Mediastinal contours appear intact. Calcific atherosclerotic disease and tortuosity of the aorta. There is no evidence of focal airspace consolidation, pleural effusion or pneumothorax. Right middle lobe mass projects as infrahilar right hemithorax density. Osseous structures are without acute abnormality. Soft tissues are grossly normal. IMPRESSION: Tortuosity and calcific atherosclerotic disease of the aorta. Persistent right middle lobe mass. Electronically Signed   By: Fidela Salisbury M.D.   On: 09/19/2017 02:45   Ct Pelvis Wo Contrast  Result Date: 09/17/2017 CLINICAL DATA:  Left hip pain and left groin pain since a fall 5 weeks ago. Proximal left femur fracture on 06/10/2017 treated with open reduction and internal fixation. EXAM: CT PELVIS WITHOUT CONTRAST TECHNIQUE: Multidetector CT imaging of the pelvis was performed following the standard protocol without intravenous  contrast. COMPARISON:  Radiographs dated 06/11/2017 and 06/10/2017 FINDINGS: Musculoskeletal: There is a 3.8 cm soft tissue mass in the left inguinal region which appears to be intimately associated with the left femoral artery and which could represent a pseudoaneurysm of the femoral artery. There is a fragment of the lesser trochanter protruding into the soft tissue density. The previous proximal left femoral fracture has completely healed. Hardware appears in good position. Urinary Tract:  No abnormality visualized. Bowel:  Scattered diverticula in the sigmoid portion of the colon. Vascular/Lymphatic: 3.8 cm soft tissue mass in the left inguinal region which cannot be separated from the left common femoral artery. This could represent an old hematoma or lymphocele but the possibility of a pseudoaneurysm should be considered. No adenopathy. Reproductive: Uterus and ovaries appear to have been removed. Pessary in place. Other:  Tiny umbilical hernia containing only fat. IMPRESSION: 1. No acute fracture. 2. Proximal left femur fracture has healed. 3. New 3.8 cm soft tissue density in the left inguinal region which could represent a pseudoaneurysm of the left common femoral artery. There is a fragment of the lesser trochanter of the proximal left femur in the adjacent soft tissues. 1 minutes into this fragment extends into the soft tissue density. Alternatively, this could represent a lymphocele or hematoma. Doppler ultrasound is recommended for further characterization. Critical Value/emergent results were called by telephone at the time of interpretation on 09/17/2017 at 4:38 pm to Dr. Rod Can , who verbally acknowledged these results. Electronically Signed   By: Lorriane Shire M.D.   On: 09/17/2017 16:40   US Arterial Lower Extremity Limited (not Including Abi's)  Result Date: 09/18/2017 CLINICAL DATA:  Left lower extremity pain. Abnormality seen in the left groin and on prior CT concerning for  pseudoaneurysm or AV fistula. EXAM: LEFT LOWER EXTREMITY ARTERIAL DUPLEX SCAN TECHNIQUE: Gray-scale sonography as well as color Doppler and duplex ultrasound was performed to evaluate the lower extremity arteries including the common, superficial and profunda femoral arteries, popliteal artery and calf arteries. COMPARISON:  CT 09/17/2017 FINDINGS: There is a large vascular masslike area noted in the left groin region which appears to be fed by the left common femoral artery compatible with pseudoaneurysm. However, there also appears to be communication to the left common femoral vein with pulsatile flow within the common femoral vein above this area compatible with AVF fistula. 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 These results were called by telephone at the time of interpretation on 09/18/2017 at 12:26 pm to  Dr. Rod Can , who verbally acknowledged these results. Electronically Signed   By: Rolm Baptise M.D.   On: 09/18/2017 12:37   Ct Angio Abd/pel W And/or Wo Contrast  Result Date: 09/18/2017 CLINICAL DATA:  Left groin pain after left-sided hip surgery 06/11/2017. EXAM: CTA ABDOMEN AND PELVIS WITHOUT AND WITH CONTRAST TECHNIQUE: Multidetector CT imaging of the abdomen and pelvis was performed using the standard protocol during bolus administration of intravenous contrast. Multiplanar reconstructed images and MIPs were obtained and reviewed to evaluate the vascular anatomy. CONTRAST:  132mL ISOVUE-370 IOPAMIDOL (ISOVUE-370) INJECTION 76% COMPARISON:  Pelvic CT 09/17/2017, CT abdomen and pelvis 09/11/2015 FINDINGS: VASCULAR Aorta: Mild aortic atherosclerosis without aneurysm. No dissection or significant stenosis. Celiac: Patent without evidence of aneurysm, dissection, vasculitis or significant stenosis. SMA: Patent without evidence of aneurysm, dissection, vasculitis or significant stenosis. Renals: Single patent renal artery to the right  kidney. Dual left renal arteries, the more cephalad is diminutive in caliber IMA: Patent without evidence of aneurysm, dissection, vasculitis or significant stenosis. Inflow: Patent without evidence of aneurysm, dissection, vasculitis or significant stenosis. Proximal Outflow: A 4.1 x 3.3 cm circumscribed soft tissue abnormality deviating the left common femoral and profunda arteries is noted in the left groin. Veins: On the venous phase, the left groin collection enhances homogeneously with displacement of the left common femoral and profunda veins. Review of the MIP images confirms the above findings. NON-VASCULAR Lower chest: Cardiomegaly without pericardial effusion. Chronic right middle lobe opacity with air bronchograms may reflect an area of chronic atelectasis. Similar finding in the left lower lobe, stable in appearance. Hepatobiliary: No focal liver abnormality is seen. No gallstones, gallbladder wall thickening, or biliary dilatation. Pancreas: Unremarkable. No pancreatic ductal dilatation or surrounding inflammatory changes. Spleen: Normal in size without focal abnormality. Adrenals/Urinary Tract: Adrenal glands are unremarkable. Kidneys are normal, without renal calculi, focal lesion, or hydronephrosis. Bladder is unremarkable. Stomach/Bowel: Decompressed stomach. Normal small bowel rotation. Moderate stool burden with scattered colonic diverticulosis. No bowel obstruction or inflammation. Lymphatic: No lymphadenopathy. Reproductive: Pessary noted. Status post hysterectomy. No adnexal mass. Other: None Musculoskeletal: Left femoral neck fixation with intramedullary nail. Displaced ossific density anterior to the left hip joint as before consistent with a fragment of the lesser trochanter. This abuts the collection from a posterolateral approach. IMPRESSION: VASCULAR Minimally larger 4.1 x 3.3 cm left groin collection displacing the left common femoral artery and profunda. Thin wispy contrast is seen  along the periphery of this collection and is suspicious for a posttraumatic extravasation into the pseudoaneurysm off the distal aspect of the common femoral artery, just proximal to its bifurcation of the profunda. Given its intimate association with the adjacent left common femoral vein and profunda vein, a venous source of this collection is not entirely excluded. Ultrasound may help for further correlation. NON-VASCULAR 1. Cardiomegaly without pericardial effusion. 2. Chronic right middle and left lower lobe atelectasis accounting for chronic opacities within the lungs dating back to 2016. 3. No acute intraabdominal nor pelvic abnormality. Electronically Signed   By: Ashley Royalty M.D.   On: 09/18/2017 22:09    Microbiology: Recent Results (from the past 240 hour(s))  Surgical pcr screen     Status: Abnormal   Collection Time: 09/23/17 12:27 AM  Result Value Ref Range Status   MRSA, PCR NEGATIVE NEGATIVE Final   Staphylococcus aureus POSITIVE (A) NEGATIVE Final    Comment: (NOTE) The Xpert SA Assay (FDA approved for NASAL specimens in patients 38 years of age and older), is  one component of a comprehensive surveillance program. It is not intended to diagnose infection nor to guide or monitor treatment.      Labs: Basic Metabolic Panel: Recent Labs  Lab 09/21/17 0310 09/22/17 0313 09/23/17 0418 09/24/17 0230 09/25/17 0520  NA 136 137 135 135 137  K 4.1 3.7 3.9 4.5 4.0  CL 104 103 102 103 102  CO2 23 25 26 24 25   GLUCOSE 107* 116* 116* 132* 121*  BUN 16 15 17 17  21*  CREATININE 0.67 0.76 0.75 0.78 0.77  CALCIUM 8.5* 8.6* 8.4* 8.7* 8.4*   Liver Function Tests: Recent Labs  Lab 09/18/17 1431 09/19/17 0314  AST 22 23  ALT 12* 12*  ALKPHOS 86 87  BILITOT 0.6 0.3  PROT 6.7 6.8  ALBUMIN 3.3* 3.2*   No results for input(s): LIPASE, AMYLASE in the last 168 hours. No results for input(s): AMMONIA in the last 168 hours. CBC: Recent Labs  Lab 09/18/17 1431  09/21/17 0310  09/22/17 0313 09/23/17 0418 09/24/17 0230 09/25/17 0520  WBC 9.2   < > 10.3 12.2* 12.3* 13.6* 11.3*  NEUTROABS 4.9  --   --   --   --   --   --   HGB 10.9*   < > 11.0* 10.9* 10.0* 10.4* 9.9*  HCT 35.6*   < > 35.6* 35.3* 32.9* 33.0* 31.8*  MCV 86.2   < > 83.6 83.8 84.1 84.6 84.1  PLT 350   < > 290 296 237 254 256   < > = values in this interval not displayed.   Cardiac Enzymes: No results for input(s): CKTOTAL, CKMB, CKMBINDEX, TROPONINI in the last 168 hours. BNP: BNP (last 3 results) No results for input(s): BNP in the last 8760 hours.  ProBNP (last 3 results) No results for input(s): PROBNP in the last 8760 hours.  CBG: No results for input(s): GLUCAP in the last 168 hours.     Signed:  Kayleen Memos, MD [facility] 09/25/2017, 2:07 PM

## 2017-09-25 NOTE — Progress Notes (Signed)
Patient will DC to: [facility] Anticipated DC date: 09/25/17 Family notified: Daughter Transport by: Corey Harold 4:30pm   Per MD patient ready for DC to [facility]. RN, patient, patient's family, and facility notified of DC. Discharge Summary sent to facility. RN given number for report [phone]). DC packet on chart. Ambulance transport requested for patient.   CSW signing off.  Cedric Fishman, [place] Social Worker [phone]

## 2017-09-25 NOTE — Progress Notes (Signed)
   VASCULAR SURGERY ASSESSMENT & PLAN:   2 Days Post-Op s/p: Repair of left common femoral artery pseudoaneurysm and osteotomy of protruding bone.  Her JP drained 100 cc last shift.  This reason, it will need to stay.  If she is discharged the family will need to be taught how to empty this and record the output or this can be done by home health.  The drain is draining lymphatic fluid not blood.   She tells me that she has not been ambulating so it likely she will need some physical therapy prior to discharge.  SUBJECTIVE:   No specific complaints.  She tells me that she has not been ambulating.  PHYSICAL EXAM:   Vitals:   09/24/17 1900 09/24/17 2201 09/25/17 0550 09/25/17 0552  BP:  (!) 124/51  (!) 130/58  Pulse:  79  64  Resp:  17  17  Temp:  98.7 F (37.1 C)  98.4 F (36.9 C)  TempSrc:  Oral  Oral  SpO2: 98% 95%  94%  Weight:   117 lb (53.1 kg)   Height:       Her left groin incision looks fine. She has a palpable left dorsalis pedis pulse.  LABS:   Lab Results  Component Value Date   WBC 11.3 (H) 09/25/2017   HGB 9.9 (L) 09/25/2017   HCT 31.8 (L) 09/25/2017   MCV 84.1 09/25/2017   PLT 256 09/25/2017   Lab Results  Component Value Date   CREATININE 0.77 09/25/2017   Lab Results  Component Value Date   INR 1.13 09/23/2017    PROBLEM LIST:    Principal Problem:   Pseudoaneurysm ([place]) Active Problems:   Atrial fibrillation ([facility])   CURRENT MEDS:   . diltiazem  60 mg Oral BID  . docusate sodium  100 mg Oral Daily  . DULoxetine  60 mg Oral BID  . lidocaine  3 patch Transdermal Q0600  . metoprolol tartrate  50 mg Oral BID  . mometasone-formoterol  2 puff Inhalation BID  . pantoprazole  40 mg Oral Daily  . pyridoxine  200 mg Oral Daily  . rivaroxaban  15 mg Oral Q supper  . vitamin B-12  1,000 mcg Oral Daily    Sara Garcia Beeper: [phone] Office: [phone] 09/25/2017

## 2017-09-25 NOTE — Clinical Social Work Placement (Signed)
   CLINICAL SOCIAL WORK PLACEMENT  NOTE  Date:  09/25/2017  Patient Details  Name: Sara Garcia MRN: [id number] Date of Birth: 1927/11/16  Clinical Social Work is seeking post-discharge placement for this patient at the [place] level of care (*CSW will initial, date and re-position this form in  chart as items are completed):  Yes   Patient/family provided with [place] Work Department's list of facilities offering this level of care within the geographic area requested by the patient (or if unable, by the patient's family).  Yes   Patient/family informed of their freedom to choose among providers that offer the needed level of care, that participate in Medicare, Medicaid or managed care program needed by the patient, have an available bed and are willing to accept the patient.  Yes   Patient/family informed of [facility]'s ownership interest in [facility] and [facility], as well as of the fact that they are under no obligation to receive care at these facilities.  PASRR submitted to EDS on       PASRR number received on       Existing PASRR number confirmed on 09/24/17     FL2 transmitted to all facilities in geographic area requested by pt/family on 09/24/17     FL2 transmitted to all facilities within larger geographic area on       Patient informed that his/her managed care company has contracts with or will negotiate with certain facilities, including the following:        Yes   Patient/family informed of bed offers received.  Patient chooses bed at [facility]     Physician recommends and patient chooses bed at      Patient to be transferred to [facility] on 09/25/17.  Patient to be transferred to facility by [facility]     Patient family notified on 09/25/17 of transfer.  Name of family member notified:  Daughter     PHYSICIAN       Additional Comment:     _______________________________________________ Benard Halsted, [place] 09/25/2017, 2:15 PM

## 2017-09-26 ENCOUNTER — Telehealth: Payer: Self-pay | Admitting: *Deleted

## 2017-09-26 NOTE — Telephone Encounter (Signed)
Patient's daughter [name] called stating that patient had understood Dr Scot Dock to say that the drainage tube in the left groin would be removed before discharge. It had not been removed. Patient is now at [facility].  Laurence Slate  PA, assistant with the surgery states that the drainage tube can be removed at this time.

## 2017-09-29 ENCOUNTER — Telehealth: Payer: Self-pay | Admitting: Physician Assistant

## 2017-09-29 ENCOUNTER — Telehealth: Payer: Self-pay | Admitting: Vascular Surgery

## 2017-09-29 NOTE — Telephone Encounter (Signed)
Patient's daughter [name] called saying the [facility] will not take patient's drainage tube out. I called the [facility] and spoke to [place].  I advised her that Laurence Slate, PA said it was [place] (per last telephone note) to remove at any time.  She will let Dr. Ronne Binning know and will contact us again if they cannot take the tubing out.  [place]., LPN.

## 2017-09-29 NOTE — Telephone Encounter (Addendum)
Tashima from the [facility] called back.  Says pt drained 70cc's yesterday and 55 cc's today. Do we still want to take the drainage tube out?  I sent Dr. Scot Dock a message to please advise. [phone] ext [phone].  (Work hrs are 3pm-11pm)   [place]urston Hole., LPN

## 2017-09-30 NOTE — Telephone Encounter (Signed)
Per Dr. Scot Dock, "It is not ready to come out until it is less than 40 cc per day".  Will make Tashima aware.

## 2017-10-01 ENCOUNTER — Ambulatory Visit: Payer: Medicare HMO | Admitting: Neurology

## 2017-10-01 ENCOUNTER — Telehealth: Payer: Self-pay

## 2017-10-01 NOTE — Telephone Encounter (Signed)
Error

## 2017-10-15 ENCOUNTER — Encounter: Payer: Self-pay | Admitting: Vascular Surgery

## 2017-10-15 ENCOUNTER — Ambulatory Visit (INDEPENDENT_AMBULATORY_CARE_PROVIDER_SITE_OTHER): Payer: Self-pay | Admitting: Vascular Surgery

## 2017-10-15 ENCOUNTER — Other Ambulatory Visit: Payer: Self-pay

## 2017-10-15 VITALS — BP 138/69 | HR 63 | Temp 97.6°F | Resp 16 | Ht 60.0 in | Wt 142.0 lb

## 2017-10-15 DIAGNOSIS — Z48812 Encounter for surgical aftercare following surgery on the circulatory system: Secondary | ICD-10-CM

## 2017-10-15 NOTE — Progress Notes (Signed)
Patient name: Sara Garcia MRN: [id number] DOB: 1928-07-16 Sex: female  REASON FOR VISIT:   Follow-up  HPI:   Sara Garcia is a pleasant 84 y.o. female who fell and had a left hip fracture.  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  She was having persistent pain and given the risk of bleeding repair was recommended.  On 09/23/2017, the patient underwent repair of the pseudoaneurysm and osteotomy of the fragment of the femur.  Of note, I did place a drain given my concern for lymphatic drainage given the extent of her wound.  She went home with this.  She is continuing to have significant clear lymphatic drainage although they are not recording the output.  They say that they emptied the drain twice a day.  She denies any other pain.  Current Outpatient Medications  Medication Sig Dispense Refill  . acetaminophen (TYLENOL) 500 MG tablet Take 1,000 mg by mouth every 6 (six) hours.     . Calcium Carb-Cholecalciferol (CALCIUM-VITAMIN D) 500-200 MG-UNIT tablet Take 1 tablet by mouth daily.    Marland Kitchen diltiazem (CARDIZEM) 60 MG tablet Take 1 tablet (60 mg total) 2 (two) times daily by mouth. 180 tablet 1  . DULoxetine (CYMBALTA) 60 MG capsule Take 60 mg by mouth 2 (two) times daily.     Marland Kitchen esomeprazole (NEXIUM) 20 MG capsule TAKE 1 CAPSULE BY MOUTH ONCE DAILY AT NOON 30 capsule 4  . Fluticasone-Salmeterol (ADVAIR) 250-50 MCG/DOSE AEPB Inhale 1 puff into the lungs daily.     . metoprolol tartrate (LOPRESSOR) 25 MG tablet Take 1.5 tablets (37.5 mg total) by mouth 2 (two) times daily. 270 tablet 1  . polyethylene glycol powder (GLYCOLAX/MIRALAX) powder MIX 17 GRAMS WITH LIQUID AND TAKE BY MOUTH ONCE DAILY (Patient taking differently: MIX 17 GRAMS WITH LIQUID AND TAKE BY MOUTH DAILY AS NEEDED FOR CONSTIPATION.) 527 g 5  . pyridoxine (B-6) 200 MG tablet Take 200 mg by mouth daily.    . vitamin  B-12 (CYANOCOBALAMIN) 1000 MCG tablet Take 1,000 mcg by mouth daily.    . Rivaroxaban (XARELTO) 15 MG TABS tablet Take 1 tablet (15 mg total) by mouth daily with supper. 28 tablet 0   No current facility-administered medications for this visit.     REVIEW OF SYSTEMS:  [X]  denotes positive finding, [ ]  denotes negative finding Cardiac  Comments:  Chest pain or chest pressure:    Shortness of breath upon exertion:    Short of breath when lying flat:    Irregular heart rhythm:    Constitutional    Fever or chills:     PHYSICAL EXAM:   Vitals:   10/15/17 0845  BP: 138/69  Pulse: 63  Resp: 16  Temp: 97.6 F (36.4 C)  TempSrc: Oral  SpO2: 95%  Weight: 142 lb (64.4 kg)  Height: 5' (1.524 m)    GENERAL: The patient is a well-nourished female, in no acute distress. The vital signs are documented above. CARDIOVASCULAR: There is a regular rate and rhythm. PULMONARY: There is good air exchange bilaterally without wheezing or rales. Her left groin incision is healing nicely. The JP is draining clear lymphatic fluid. She has a palpable dorsalis pedis pulse bilaterally.  DATA:   No new data  MEDICAL ISSUES:   STATUS POST REPAIR OF LEFT FEMORAL ARTERY PSEUDOANEURYSM: Given the persistent lymphatic drainage, I will have her come  back in 2 weeks and hopefully can get the drain out at that time.  I have asked them to record the drainage and instructed them on how to do that.  I have explained that oftentimes the drainage will stop eventually.  The alternative would be to return her to the operating room for exploration of the wound closure and possible placement of a VAC.  I would like to try to avoid that.  Given that there is no prosthetic material in the wound I think that the safest approach.  [name] [facility] [phone]

## 2017-10-17 ENCOUNTER — Other Ambulatory Visit: Payer: Self-pay

## 2017-10-17 ENCOUNTER — Emergency Department (HOSPITAL_COMMUNITY)
Admission: EM | Admit: 2017-10-17 | Discharge: 2017-10-18 | Disposition: A | Discharge status: Home / Self Care | Payer: Medicare HMO | Attending: Emergency Medicine | Admitting: Emergency Medicine

## 2017-10-17 ENCOUNTER — Encounter (HOSPITAL_COMMUNITY): Payer: Self-pay | Admitting: Emergency Medicine

## 2017-10-17 ENCOUNTER — Emergency Department (HOSPITAL_COMMUNITY): Payer: Medicare HMO

## 2017-10-17 DIAGNOSIS — Z79899 Other long term (current) drug therapy: Secondary | ICD-10-CM | POA: Insufficient documentation

## 2017-10-17 DIAGNOSIS — R2242 Localized swelling, mass and lump, left lower limb: Secondary | ICD-10-CM | POA: Diagnosis present

## 2017-10-17 DIAGNOSIS — L03116 Cellulitis of left lower limb: Secondary | ICD-10-CM | POA: Diagnosis not present

## 2017-10-17 DIAGNOSIS — Y813 Surgical instruments, materials and general- and plastic-surgery devices (including sutures) associated with adverse incidents: Secondary | ICD-10-CM | POA: Insufficient documentation

## 2017-10-17 DIAGNOSIS — Z7901 Long term (current) use of anticoagulants: Secondary | ICD-10-CM | POA: Diagnosis not present

## 2017-10-17 DIAGNOSIS — J45909 Unspecified asthma, uncomplicated: Secondary | ICD-10-CM | POA: Insufficient documentation

## 2017-10-17 DIAGNOSIS — I10 Essential (primary) hypertension: Secondary | ICD-10-CM | POA: Diagnosis not present

## 2017-10-17 DIAGNOSIS — T8149XA Infection following a procedure, other surgical site, initial encounter: Secondary | ICD-10-CM | POA: Diagnosis not present

## 2017-10-17 LAB — CBC WITH DIFFERENTIAL/PLATELET
Basophils Absolute: 0 10*3/uL (ref 0.0–0.1)
Basophils Relative: 0 %
Eosinophils Absolute: 0.1 10*3/uL (ref 0.0–0.7)
Eosinophils Relative: 1 %
HCT: 33.9 % — ABNORMAL LOW (ref 36.0–46.0)
Hemoglobin: 10.3 g/dL — ABNORMAL LOW (ref 12.0–15.0)
Lymphocytes Relative: 19 %
Lymphs Abs: 2.3 10*3/uL (ref 0.7–4.0)
MCH: 25.2 pg — ABNORMAL LOW (ref 26.0–34.0)
MCHC: 30.4 g/dL (ref 30.0–36.0)
MCV: 82.9 fL (ref 78.0–100.0)
Monocytes Absolute: 1.4 10*3/uL — ABNORMAL HIGH (ref 0.1–1.0)
Monocytes Relative: 11 %
Neutro Abs: 8.4 10*3/uL — ABNORMAL HIGH (ref 1.7–7.7)
Neutrophils Relative %: 69 %
Platelets: 292 10*3/uL (ref 150–400)
RBC: 4.09 MIL/uL (ref 3.87–5.11)
RDW: 16.1 % — ABNORMAL HIGH (ref 11.5–15.5)
WBC: 12.2 10*3/uL — ABNORMAL HIGH (ref 4.0–10.5)

## 2017-10-17 LAB — BASIC METABOLIC PANEL
Anion gap: 11 (ref 5–15)
BUN: 24 mg/dL — ABNORMAL HIGH (ref 6–20)
CO2: 22 mmol/L (ref 22–32)
Calcium: 8.8 mg/dL — ABNORMAL LOW (ref 8.9–10.3)
Chloride: 106 mmol/L (ref 101–111)
Creatinine, Ser: 0.7 mg/dL (ref 0.44–1.00)
GFR calc Af Amer: >60 mL/min (ref >60)
GFR calc non Af Amer: >60 mL/min (ref >60)
Glucose, Bld: 128 mg/dL — ABNORMAL HIGH (ref 65–99)
Potassium: 3.3 mmol/L — ABNORMAL LOW (ref 3.5–5.1)
Sodium: 139 mmol/L (ref 135–145)

## 2017-10-17 MED ORDER — METHOCARBAMOL 500 MG PO TABS
500.0000 mg | ORAL_TABLET | Freq: Once | ORAL | Status: AC
  Administered 2017-10-17: 500 mg via ORAL
  Filled 2017-10-17: qty 1

## 2017-10-17 MED ORDER — IOPAMIDOL (ISOVUE-300) INJECTION 61%
100.0000 mL | Freq: Once | INTRAVENOUS | Status: AC | PRN
  Administered 2017-10-18: 100 mL via INTRAVENOUS

## 2017-10-17 MED ORDER — VANCOMYCIN HCL IN DEXTROSE 1-5 GM/200ML-% IV SOLN
1000.0000 mg | Freq: Once | INTRAVENOUS | Status: AC
  Administered 2017-10-17: 1000 mg via INTRAVENOUS
  Filled 2017-10-17: qty 200

## 2017-10-17 MED ORDER — FENTANYL CITRATE (PF) 100 MCG/2ML IJ SOLN
50.0000 ug | Freq: Once | INTRAMUSCULAR | Status: AC
  Administered 2017-10-17: 50 ug via INTRAVENOUS
  Filled 2017-10-17: qty 2

## 2017-10-17 MED ORDER — RIVAROXABAN 15 MG PO TABS: 15.0000 mg | ORAL_TABLET | Freq: Every day | ORAL | 0 refills | Status: DC

## 2017-10-17 NOTE — ED Notes (Signed)
Per family pt rolled over last night & pulled drain out of her leg. Noted drainage, redness & warmth around the sight.

## 2017-10-17 NOTE — ED Notes (Signed)
Consulted EDP. No additional orders given at this time.

## 2017-10-17 NOTE — ED Triage Notes (Signed)
Per family, pt anuerysm drain from LUE came out last night. Pt reports abdominal pain, nausea ever since. Pt family states aneurysm is in left pelvis from past hip fracture. Site noted to be red and warm to touch.

## 2017-10-18 MED ORDER — ACETAMINOPHEN 500 MG PO TABS
1000.0000 mg | ORAL_TABLET | Freq: Once | ORAL | Status: AC
  Administered 2017-10-18: 1000 mg via ORAL

## 2017-10-18 MED ORDER — ACETAMINOPHEN 500 MG PO TABS
ORAL_TABLET | ORAL | Status: AC
  Filled 2017-10-18: qty 2

## 2017-10-18 MED ORDER — DOXYCYCLINE HYCLATE 100 MG PO CAPS: 100.0000 mg | ORAL_CAPSULE | Freq: Two times a day (BID) | ORAL | 0 refills | Status: DC

## 2017-10-18 NOTE — ED Provider Notes (Signed)
[facility] EMERGENCY DEPARTMENT Provider Note   CSN: [id number] Arrival date & time: 10/17/17  1358  Time seen 23:13 PM   History   Chief Complaint Chief Complaint  Patient presents with  . Post-op Problem  . Wound Infection    HPI Sara Garcia is a 84 y.o. female.  HPI patient had a fall and had a left comminuted intertrochanteric hip fracture in August.  She was diagnosed with a pseudoaneurysm of her left femoral artery in December and she had surgical repair done on December 11 by Dr. Scot Dock.  She had a drain that has been draining clear fluid several times a day.  She was last seen by him in follow-up on the third and everything was doing well.  Family states when the patient woke up this morning the drain had been pulled out.  They called the office and were unable to speak to a nurse to advise them what to do so they came to the emergency department.  She states since the drain came out she is having increasing pain, and increasing swelling and redness that she did not have before.  They are unaware of any fever.  PCP Doree Albee, MD Vascular Dr Scot Dock  Past Medical History:  Diagnosis Date  . Arthritis   . Asthma   . DVT (deep venous thrombosis) ([place]) 2011/2012  . Esophageal dysphagia    limited food and pill  . Esophageal reflux   . Essential hypertension, benign   . GERD (gastroesophageal reflux disease)   . Osteoporosis   . Overactive bladder   . Paroxysmal atrial fibrillation (H[facility])    a. on Xarelto for anticoagulation  . Pseudoaneurysm ([place]) 09/2017  . Stroke ([place])   . Unspecified prolapse of vaginal walls     Patient Active Problem List   Diagnosis Date Noted  . Pseudoaneurysm ([place]) 09/18/2017  . Acute CVA (cerebrovascular accident) ([place]) 07/23/2017  . Dysarthria 07/21/2017  . Tachycardia 07/21/2017  . Closed comminuted intertrochanteric fracture of left femur ([place]) 06/11/2017  . Diarrhea 01/03/2016  . IDA (iron deficiency anemia) 11/09/2015  .  Dysphagia, pharyngoesophageal phase   . Hiatal hernia   . Constipation 01/16/2015  . Esophageal dysphagia 06/27/2014  . GERD (gastroesophageal reflux disease) 06/27/2014  . Personal history of colonic polyps 06/27/2014  . Chronic anticoagulation 06/27/2014  . Primary osteoarthritis of right knee 05/09/2014  . DISH (diffuse idiopathic skeletal hyperostosis) 05/09/2014  . DDD (degenerative disc disease), lumbosacral 05/09/2014  . Prolapse of vaginal vault after hysterectomy 04/07/2014  . Atrial fibrillation ([place]) 01/10/2014  . Radiculopathy 01/10/2014  . Essential hypertension 12/21/2013    Past Surgical History:  Procedure Laterality Date  . ABDOMINAL HYSTERECTOMY    . COLONOSCOPY W/ BIOPSIES  02/01/2004   RMR: Anal papilla with normal rectum/Sigmoid diverticula/Small polyps in the cecum removed as described above. Inflammatory polyp  . ESOPHAGEAL DILATION     unknown year per patient.  . ESOPHAGEAL DILATION N/A 02/07/2015   RMR: Normal esophagus status post maloney dilation. Small hiatal hernia.   Marland Kitchen ESOPHAGOGASTRODUODENOSCOPY     unknown year per patient  . ESOPHAGOGASTRODUODENOSCOPY N/A 02/07/2015   RMR: Normal esophagus  status post  Maloney dilation. Small hiatal hernia  . FALSE ANEURYSM REPAIR Left 09/23/2017   Procedure: REPAIR FALSE ANEURYSM COMMON LEFT FEMORAL ARTERY WITH OSTEOTOMY OF BONE FRAGEMENT;  Surgeon: Angelia Mould, MD;  Location: [place];  Service: Vascular;  Laterality: Left;  . FEMUR IM NAIL Left 06/11/2017   Procedure: INTRAMEDULLARY (  IM) NAIL FEMORAL;  Surgeon: Rod Can, MD;  Location: [facility];  Service: Orthopedics;  Laterality: Left;  . NASAL SEPTUM SURGERY    . TONSILLECTOMY    . WRIST FRACTURE SURGERY      OB History    No data available       Home Medications    Prior to Admission medications   Medication Sig Start Date End Date Taking? Authorizing Provider  acetaminophen (TYLENOL) 500 MG tablet Take 1,000 mg by mouth every 6 (six)  hours.     [provider]  Calcium Carb-Cholecalciferol (CALCIUM-VITAMIN D) 500-200 MG-UNIT tablet Take 1 tablet by mouth daily.    [provider]  diltiazem (CARDIZEM) 60 MG tablet Take 1 tablet (60 mg total) 2 (two) times daily by mouth. 08/19/17   Arnoldo Lenis, MD  doxycycline (VIBRAMYCIN) 100 MG capsule Take 1 capsule (100 mg total) by mouth 2 (two) times daily. 10/18/17   Rolland Porter, MD  DULoxetine (CYMBALTA) 60 MG capsule Take 60 mg by mouth 2 (two) times daily.     [provider]  esomeprazole (NEXIUM) 20 MG capsule TAKE 1 CAPSULE BY MOUTH ONCE DAILY AT NOON 08/15/17   Annitta Needs, NP  Fluticasone-Salmeterol (ADVAIR) 250-50 MCG/DOSE AEPB Inhale 1 puff into the lungs daily.     [provider]  metoprolol tartrate (LOPRESSOR) 25 MG tablet Take 1.5 tablets (37.5 mg total) by mouth 2 (two) times daily. 02/24/17   Arnoldo Lenis, MD  polyethylene glycol powder (GLYCOLAX/MIRALAX) powder MIX 17 GRAMS WITH LIQUID AND TAKE BY MOUTH ONCE DAILY Patient taking differently: MIX 17 GRAMS WITH LIQUID AND TAKE BY MOUTH DAILY AS NEEDED FOR CONSTIPATION. 12/13/16   Mahala Menghini, PA-C  pyridoxine (B-6) 200 MG tablet Take 200 mg by mouth daily.    [provider]  Rivaroxaban (XARELTO) 15 MG TABS tablet Take 1 tablet (15 mg total) by mouth daily with supper. 10/17/17 11/16/17  Satira Sark, MD  vitamin B-12 (CYANOCOBALAMIN) 1000 MCG tablet Take 1,000 mcg by mouth daily.    [provider]    Family History Family History  Problem Relation Age of Onset  . Heart disease Mother   . Stroke Mother   . Emphysema Father   . Colon polyps Neg Hx   . Colon cancer Neg Hx     Social History Social History   Tobacco Use  . Smoking status: Never Smoker  . Smokeless tobacco: Never Used  Substance Use Topics  . Alcohol use: No    Alcohol/week: 0.0 oz  . Drug use: No  lives at home   Allergies   Latex; Adhesive [tape]; Betadine [povidone  iodine]; Ciprofloxacin; Codeine; Penicillins; Sulfa antibiotics; and Wellbutrin [bupropion]   Review of Systems Review of Systems  All other systems reviewed and are negative.    Physical Exam Updated Vital Signs BP 135/71   Pulse 88   Temp 99 F (37.2 C) (Oral)   Resp 18   Ht 5' (1.524 m)   Wt 64.4 kg (142 lb)   SpO2 94%   BMI 27.73 kg/m   Vital signs normal    Physical Exam  Constitutional: She is oriented to person, place, and time. She appears well-developed and well-nourished.  HENT:  Head: Normocephalic and atraumatic.  Right Ear: External ear normal.  Left Ear: External ear normal.  Eyes: Conjunctivae and EOM are normal. Pupils are equal, round, and reactive to light.  Cardiovascular: Normal rate.  Pulmonary/Chest: Effort normal.  No respiratory distress.  Musculoskeletal: She exhibits edema and tenderness.  Pt has moderate swelling and redness of her left proximal thigh. Has an opening where the drain was in place, has a moderate amount of induration that is painful.   Neurological: She is alert and oriented to person, place, and time. No cranial nerve deficit.  Skin: Skin is warm and dry. Capillary refill takes less than 2 seconds. There is erythema.  Psychiatric: She has a normal mood and affect. Her behavior is normal. Thought content normal.         ED Treatments / Results  Labs (all labs ordered are listed, but only abnormal results are displayed) Results for orders placed or performed during the hospital encounter of 10/17/17  CBC with Differential  Result Value Ref Range   WBC 12.2 (H) 4.0 - 10.5 K/uL   RBC 4.09 3.87 - 5.11 MIL/uL   Hemoglobin 10.3 (L) 12.0 - 15.0 g/dL   HCT 33.9 (L) 36.0 - 46.0 %   MCV 82.9 78.0 - 100.0 fL   MCH 25.2 (L) 26.0 - 34.0 pg   MCHC 30.4 30.0 - 36.0 g/dL   RDW 16.1 (H) 11.5 - 15.5 %   Platelets 292 150 - 400 K/uL   Neutrophils Relative % 69 %   Neutro Abs 8.4 (H) 1.7 - 7.7 K/uL   Lymphocytes Relative 19 %    Lymphs Abs 2.3 0.7 - 4.0 K/uL   Monocytes Relative 11 %   Monocytes Absolute 1.4 (H) 0.1 - 1.0 K/uL   Eosinophils Relative 1 %   Eosinophils Absolute 0.1 0.0 - 0.7 K/uL   Basophils Relative 0 %   Basophils Absolute 0.0 0.0 - 0.1 K/uL  Basic metabolic panel  Result Value Ref Range   Sodium 139 135 - 145 mmol/L   Potassium 3.3 (L) 3.5 - 5.1 mmol/L   Chloride 106 101 - 111 mmol/L   CO2 22 22 - 32 mmol/L   Glucose, Bld 128 (H) 65 - 99 mg/dL   BUN 24 (H) 6 - 20 mg/dL   Creatinine, Ser 0.70 0.44 - 1.00 mg/dL   Calcium 8.8 (L) 8.9 - 10.3 mg/dL   GFR calc non Af Amer >60 >60 mL/min   GFR calc Af Amer >60 >60 mL/min   Anion gap 11 5 - 15   Laboratory interpretation all normal except leukocytosis, mild anemia and hypokalemia, mild hyperglycemia    EKG  EKG Interpretation None       Radiology Ct Extrem Lower W Cm Bil  Result Date: 10/18/2017 CLINICAL DATA:  Recent surgery for pseudoaneurysm and bone chip removal, drain recently pulled out. Increased swelling and redness. Question abscess. EXAM: CT OF THE LOWER BILATERAL EXTREMITY WITH CONTRAST TECHNIQUE: Multidetector CT imaging of the lower bilateral extremity was performed according to the standard protocol following intravenous contrast administration. COMPARISON:  Preoperative CT 09/18/2017 CONTRAST:  165mL ISOVUE-300 IOPAMIDOL (ISOVUE-300) INJECTION 61% FINDINGS: Bones/Joint/Cartilage Lesser trochanteric fracture fragment not significantly changed in appearance from prior exam. Previous left femoral fracture is healed. Left femur intramedullary nail and lag screw fixation with intact hardware. No acute osseous abnormality of either lower extremity. Mild degenerative change of the right hip. Ligaments Suboptimally assessed by CT. Muscles and Tendons Mild stranding about left sartorius muscle without intramuscular fluid collection. Lower extremity muscle bulk is symmetric. No intramuscular collections throat either lower extremity. Soft  tissues Surgical clips and stranding adjacent to the left external iliac and common femoral artery from prior pseudoaneurysm repair. No evidence  of recurrence pseudoaneurysm. Fluid-filled track extends from the common femoral artery anteriorly in the distal thigh to the skin surface which conforms to expected location of prior surgical drain. No peripheral enhancement or internal air to suggest abscess. Prominent lymph node in the left inguinal region is likely reactive. No focal fluid collection to suggest abscess. No gross venous filling defect, however timing is slightly suboptimal for evaluation of DVT. Superficial saphenous vein in the left leg is prominent. There is soft tissue edema and skin thickening about the distal left lower leg and ankle but is nonspecific. IMPRESSION: 1. Post recent left inguinal pseudoaneurysm repair. Fluid-filled track in the expected location of prior surgical drain. Mild adjacent soft tissue stranding about the tract may be normal post surgical or early infection. No definite abnormal enhancement or internal air to suggest abscess. No additional fluid collection. No recurrent pseudoaneurysm. 2. Separate soft tissue edema and skin thickening about the left distal lower leg of unknown significance and etiology, neck contiguous with the left inguinal process. Electronically Signed   By: Jeb Levering M.D.   On: 10/18/2017 01:39    Procedures Procedures (including critical care time)  Medications Ordered in ED Medications  vancomycin (VANCOCIN) IVPB 1000 mg/200 mL premix (0 mg Intravenous Stopped 10/18/17 0030)  methocarbamol (ROBAXIN) tablet 500 mg (500 mg Oral Given 10/17/17 2339)  fentaNYL (SUBLIMAZE) injection 50 mcg (50 mcg Intravenous Given 10/17/17 2346)  iopamidol (ISOVUE-300) 61 % injection 100 mL (100 mLs Intravenous Contrast Given 10/18/17 0038)     Initial Impression / Assessment and Plan / ED Course  I have reviewed the triage vital signs and the nursing  notes.  Pertinent labs & imaging results that were available during my care of the patient were reviewed by me and considered in my medical decision making (see chart for details).    We discussed her test results.  CT scan was ordered to see if there was a large collection of fluid or just cellulitis present.  I suspect there is a collection of fluid and possible abscess.  Patient was started on IV vancomycin, she was given Robaxin which she requested for muscle spasms.  She was given IV pain medication.  I will talk to her vascular surgeon once the results of the CT is available.  01:31 AM Scot Dock, put in a Qtip to see if can get it to drain and if so put in some packing to keep it open and start on doxycycline. If gets worse go to [facility] ED. He reviewed her CT scan and her pictures.   I used a sterile Q-tip and placed it in the prior draining hole, there was a small amount of white material around the opening otherwise there was no further drainage.  Final Clinical Impressions(s) / ED Diagnoses   Final diagnoses:  Wound infection after surgery  Cellulitis of left lower extremity    ED Discharge Orders        Ordered    doxycycline (VIBRAMYCIN) 100 MG capsule  2 times daily     10/18/17 0247     Plan discharge  Rolland Porter, MD, Barbette Or, MD 10/18/17 [phone]

## 2017-10-18 NOTE — ED Notes (Signed)
Family asking if pt going to get anything for pain before leaving. EDP made aware.

## 2017-10-18 NOTE — ED Notes (Signed)
NT informed this nurse pt states pain is increasing. EDP notified.

## 2017-10-18 NOTE — Discharge Instructions (Signed)
Use warmth for comfort. Take the antibiotic twice a day. Call Dr Nicole Cella office on Monday, January 7, to be seen in the office soon for your infection. If you get worse over the weekend, he wants you to go to [facility] ED so he or his partner could see you there.

## 2017-10-18 NOTE — ED Notes (Signed)
Pt alert & oriented x4. Patient given discharge instructions, paperwork & prescription(s). Patient verbalized understanding. Pt left department w/ no further questions.  

## 2017-10-18 NOTE — ED Notes (Signed)
Pt requesting pain medicine.

## 2017-10-18 NOTE — ED Notes (Signed)
Pt requesting pain medication at this time , changed dressing on thigh area and put brief on pt.

## 2017-10-22 ENCOUNTER — Ambulatory Visit (INDEPENDENT_AMBULATORY_CARE_PROVIDER_SITE_OTHER): Payer: Medicare HMO | Admitting: Vascular Surgery

## 2017-10-22 ENCOUNTER — Encounter: Payer: Self-pay | Admitting: Vascular Surgery

## 2017-10-22 VITALS — BP 124/49 | HR 58 | Temp 97.3°F | Resp 16 | Ht 60.0 in | Wt 142.0 lb

## 2017-10-22 DIAGNOSIS — Z48812 Encounter for surgical aftercare following surgery on the circulatory system: Secondary | ICD-10-CM

## 2017-10-22 MED ORDER — OXYCODONE-ACETAMINOPHEN 5-325 MG PO TABS: 1.0000 | ORAL_TABLET | Freq: Four times a day (QID) | ORAL | 0 refills | Status: DC | PRN

## 2017-10-22 MED ORDER — METHOCARBAMOL 500 MG PO TABS: 500.0000 mg | ORAL_TABLET | Freq: Four times a day (QID) | ORAL | 0 refills | Status: DC

## 2017-10-22 NOTE — Progress Notes (Signed)
Patient name: Sara Garcia MRN: [id number] DOB: Mar 22, 1928 Sex: female  REASON FOR VISIT:   Follow-up after repair of left femoral artery pseudoaneurysm  HPI:   Sara Garcia is a pleasant 84 y.o. female who fell and had a left hip fracture.  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  She had a large fragment of bone adjacent to the pseudoaneurysm.  She was taken to the operating room on 09/23/2017 and underwent repair of the pseudoaneurysm and osteotomy of the fragment of femur.  I placed the JP drain because I was concerned she had some lymphatic drainage.  When I saw her last on 10/15/2017 she was having some persistent lymphatic drainage in the JP.  After she left the JP came out.  She was seen in an outlying emergency department and was started on doxycycline.  She comes in for continued follow-up.  She is continuing to have some pain at the site of her surgery.  She also complains of some muscle spasms.  Current Outpatient Medications  Medication Sig Dispense Refill  . acetaminophen (TYLENOL) 500 MG tablet Take 1,000 mg by mouth every 6 (six) hours.     . Calcium Carb-Cholecalciferol (CALCIUM-VITAMIN D) 500-200 MG-UNIT tablet Take 1 tablet by mouth daily.    Marland Kitchen diltiazem (CARDIZEM) 60 MG tablet Take 1 tablet (60 mg total) 2 (two) times daily by mouth. 180 tablet 1  . DOXYCYCLINE PO Take 100 mg by mouth 2 (two) times daily.    . DULoxetine (CYMBALTA) 60 MG capsule Take 60 mg by mouth 2 (two) times daily.     Marland Kitchen esomeprazole (NEXIUM) 20 MG capsule TAKE 1 CAPSULE BY MOUTH ONCE DAILY AT NOON 30 capsule 4  . Fluticasone-Salmeterol (ADVAIR) 250-50 MCG/DOSE AEPB Inhale 1 puff into the lungs daily.     . metoprolol tartrate (LOPRESSOR) 25 MG tablet Take 1.5 tablets (37.5 mg total) by mouth 2 (two) times daily. 270 tablet 1  . polyethylene glycol powder (GLYCOLAX/MIRALAX) powder MIX 17 GRAMS WITH LIQUID AND TAKE BY MOUTH ONCE DAILY (Patient taking differently:  MIX 17 GRAMS WITH LIQUID AND TAKE BY MOUTH DAILY AS NEEDED FOR CONSTIPATION.) 527 g 5  . pyridoxine (B-6) 200 MG tablet Take 200 mg by mouth daily.    . Rivaroxaban (XARELTO) 15 MG TABS tablet Take 1 tablet (15 mg total) by mouth daily with supper. 7 tablet 0  . vitamin B-12 (CYANOCOBALAMIN) 1000 MCG tablet Take 1,000 mcg by mouth daily.    . methocarbamol (ROBAXIN) 500 MG tablet Take 1 tablet (500 mg total) by mouth 4 (four) times daily. 30 tablet 0  . oxyCODONE-acetaminophen (PERCOCET/ROXICET) 5-325 MG tablet Take 1-2 tablets by mouth every 6 (six) hours as needed for severe pain. 25 tablet 0   No current facility-administered medications for this visit.     REVIEW OF SYSTEMS:  [X]  denotes positive finding, [ ]  denotes negative finding Cardiac  Comments:  Chest pain or chest pressure:    Shortness of breath upon exertion:    Short of breath when lying flat:    Irregular heart rhythm:    Constitutional    Fever or chills:     PHYSICAL EXAM:   Vitals:   10/22/17 1035  BP: (!) 124/49  Pulse: (!) 58  Resp: 16  Temp: (!) 97.3 F (36.3 C)  TempSrc: Oral  SpO2: 92%  Weight: 142 lb (64.4 kg)  Height: 5' (1.524 m)    GENERAL: The patient is a  well-nourished female, in no acute distress. The vital signs are documented above. CARDIOVASCULAR: There is a regular rate and rhythm. PULMONARY: There is good air exchange bilaterally without wheezing or rales. Her left groin incision is healing nicely. There is no lymphatic drainage from the drain site. She has mild erythema in left thigh. She has a palpable dorsalis pedis pulse.  DATA:   No new data.  MEDICAL ISSUES:   STATUS POST REPAIR OF LEFT FEMORAL ARTERY PSEUDOANEURYSM: Patient has mild cellulitis of the left thigh around the site of the drain.  I have encouraged her to continue her doxycycline.  Her incision is healing nicely and overall think she is making good progress although she is continuing to have some pain.  I have  written her a prescription for 25 Percocet for pain and also Robaxin for muscle spasms.  I plan on seeing her back in 6 weeks.  She knows to call sooner if she has problems.  [name] [facility] [phone]

## 2017-11-06 ENCOUNTER — Other Ambulatory Visit: Payer: Self-pay | Admitting: Cardiology

## 2017-11-07 ENCOUNTER — Ambulatory Visit: Payer: Medicare HMO | Admitting: Family

## 2017-12-03 ENCOUNTER — Ambulatory Visit: Payer: Medicare HMO | Admitting: Vascular Surgery

## 2017-12-30 ENCOUNTER — Other Ambulatory Visit: Payer: Self-pay | Admitting: *Deleted

## 2017-12-30 MED ORDER — RIVAROXABAN 15 MG PO TABS: 15.0000 mg | ORAL_TABLET | Freq: Every day | ORAL | 0 refills | Status: DC

## 2018-01-06 ENCOUNTER — Other Ambulatory Visit (HOSPITAL_COMMUNITY): Payer: Self-pay | Admitting: Internal Medicine

## 2018-01-06 DIAGNOSIS — R269 Unspecified abnormalities of gait and mobility: Secondary | ICD-10-CM

## 2018-01-12 ENCOUNTER — Ambulatory Visit (HOSPITAL_COMMUNITY)
Admission: RE | Admit: 2018-01-12 | Discharge: 2018-01-12 | Disposition: A | Discharge status: Home / Self Care | Payer: Medicare HMO | Source: Ambulatory Visit | Attending: Internal Medicine | Admitting: Internal Medicine

## 2018-01-12 DIAGNOSIS — I6782 Cerebral ischemia: Secondary | ICD-10-CM | POA: Insufficient documentation

## 2018-01-12 DIAGNOSIS — R269 Unspecified abnormalities of gait and mobility: Secondary | ICD-10-CM | POA: Diagnosis present

## 2018-01-21 ENCOUNTER — Encounter: Payer: Self-pay | Admitting: Vascular Surgery

## 2018-01-21 ENCOUNTER — Other Ambulatory Visit: Payer: Self-pay

## 2018-01-21 ENCOUNTER — Ambulatory Visit: Payer: Medicare HMO | Admitting: Vascular Surgery

## 2018-01-21 VITALS — BP 156/70 | HR 62 | Resp 18 | Ht 60.0 in | Wt 142.0 lb

## 2018-01-21 DIAGNOSIS — Z48812 Encounter for surgical aftercare following surgery on the circulatory system: Secondary | ICD-10-CM | POA: Diagnosis not present

## 2018-01-21 NOTE — Progress Notes (Signed)
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  HPI:   Sara Garcia is a pleasant 84 y.o. female who fell and sustained a left hip fracture.  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  I took her to the operating room on 09/23/2017 and she underwent repair of the pseudoaneurysm and osteotomy of the fragment of the femur.  I last saw the patient on 10/22/2017.  The patient had a Terrial Rhodes drain placed because of some lymphatic drainage and ultimately this came out.  She was seen in the emergency department and put on doxycycline.  When I saw her last there was some mild cellulitis in the left thigh around the site of the drain.  I encouraged her to continue her doxycycline.  I set up a 6-week follow-up visit.  Since I saw her last she has no specific complaints except that her daughter tells me she may have had a small stroke.  This was associated with some expressive aphasia.  She had an MRI which reportedly showed a new stroke on the right side and she has an  old stroke on the left side.  Her activity is fairly limited.  I do not get any history of claudication or rest pain.  Current Outpatient Medications  Medication Sig Dispense Refill  . acetaminophen (TYLENOL) 500 MG tablet Take 1,000 mg by mouth every 6 (six) hours.     . Calcium Carb-Cholecalciferol (CALCIUM-VITAMIN D) 500-200 MG-UNIT tablet Take 1 tablet by mouth daily.    Marland Kitchen diltiazem (CARDIZEM) 60 MG tablet Take 1 tablet (60 mg total) 2 (two) times daily by mouth. 180 tablet 1  . DULoxetine (CYMBALTA) 60 MG capsule Take 60 mg by mouth 2 (two) times daily.     Marland Kitchen esomeprazole (NEXIUM) 20 MG capsule TAKE 1 CAPSULE BY MOUTH ONCE DAILY AT NOON 30 capsule 4  . Fluticasone-Salmeterol (ADVAIR) 250-50 MCG/DOSE AEPB Inhale 1 puff into the  lungs daily.     . methocarbamol (ROBAXIN) 500 MG tablet Take 1 tablet (500 mg total) by mouth 4 (four) times daily. 30 tablet 0  . metoprolol tartrate (LOPRESSOR) 25 MG tablet Take 1.5 tablets (37.5 mg total) by mouth 2 (two) times daily. 270 tablet 1  . metoprolol tartrate (LOPRESSOR) 25 MG tablet TAKE 1 & 1/2 TABLETS BY MOUTH TWICE DAILY 270 tablet 2  . polyethylene glycol powder (GLYCOLAX/MIRALAX) powder MIX 17 GRAMS WITH LIQUID AND TAKE BY MOUTH ONCE DAILY (Patient taking differently: MIX 17 GRAMS WITH LIQUID AND TAKE BY MOUTH DAILY AS NEEDED FOR CONSTIPATION.) 527 g 5  . pyridoxine (B-6) 200 MG tablet Take 200 mg by mouth daily.    . Rivaroxaban (XARELTO) 15 MG TABS tablet Take 1 tablet (15 mg total) by mouth daily with supper. 21 tablet 0  . vitamin B-12 (CYANOCOBALAMIN) 1000 MCG tablet Take 1,000 mcg by mouth daily.    Marland Kitchen DOXYCYCLINE PO Take 100 mg by mouth 2 (two) times daily.    Marland Kitchen oxyCODONE-acetaminophen (PERCOCET/ROXICET) 5-325 MG tablet Take 1-2 tablets by mouth every 6 (six) hours as needed for severe pain. (Patient not taking: Reported on 01/21/2018) 25 tablet 0   No current facility-administered medications for this visit.     REVIEW OF SYSTEMS:  [X]  denotes positive finding, [ ]  denotes negative finding  Cardiac  Comments:  Chest pain or chest pressure:    Shortness of breath upon exertion:    Short of breath when lying flat:    Irregular heart rhythm: x   Constitutional    Fever or chills:     PHYSICAL EXAM:   Vitals:   01/21/18 0825 01/21/18 0826  BP: (!) 161/68 (!) 156/70  Pulse: 62   Resp: 18   SpO2: 96%   Weight: 142 lb (64.4 kg)   Height: 5' (1.524 m)     GENERAL: The patient is a well-nourished female, in no acute distress. The vital signs are documented above. CARDIOVASCULAR: There is a regular rate and rhythm. PULMONARY: There is good air exchange bilaterally without wheezing or rales. VASCULAR: I do not detect carotid bruits. She has palpable dorsalis  pedis pulses bilaterally. Her left groin incision is healed.  DATA:   No new data  MEDICAL ISSUES:   STATUS POST REPAIR OF LEFT FEMORAL ARTERY PSEUDOANEURYSM: Patient is doing well status post repair of a left femoral artery pseudoaneurysm.  The wound has completely healed and she has a palpable dorsalis pedis pulse.  I will see her back as needed.  RIGHT BRAIN STROKE: Her MRI on 01/12/2018 shows a lacunar infarct in the right centrum semi-ovale.  She is scheduled to see a neurologist tomorrow.  For this reason I did not order a carotid duplex scan as this may already be scheduled.  I will be happy to see her back if any new vascular issues arise.    [name] [facility] [phone]

## 2018-01-22 ENCOUNTER — Encounter: Payer: Self-pay | Admitting: Neurology

## 2018-01-22 ENCOUNTER — Ambulatory Visit: Payer: Medicare HMO | Admitting: Neurology

## 2018-01-22 VITALS — BP 130/67 | HR 54 | Ht 60.0 in | Wt 144.2 lb

## 2018-01-22 DIAGNOSIS — M1711 Unilateral primary osteoarthritis, right knee: Secondary | ICD-10-CM

## 2018-01-22 DIAGNOSIS — I63412 Cerebral infarction due to embolism of left middle cerebral artery: Secondary | ICD-10-CM

## 2018-01-22 DIAGNOSIS — I6990 Unspecified sequelae of unspecified cerebrovascular disease: Secondary | ICD-10-CM | POA: Diagnosis not present

## 2018-01-22 DIAGNOSIS — R269 Unspecified abnormalities of gait and mobility: Secondary | ICD-10-CM

## 2018-01-22 NOTE — Progress Notes (Signed)
[facility] [address] [place]. [place] [address] [phone]       OFFICE CONSULT NOTE  Sara. Sara Garcia Date of Birth:  1927-12-28 Medical Record Number:  [id number]   Referring MD:  Hurshel Party    Reason for Referral:  Gait abnormality  HPI: Sara Garcia  is a pleasant 84 year lady who is seen today for the initial consultation visit upon referral from Dr. Anastasio Champion. The patient states that she has had long-standing walking difficulties following her left hip fracture in August 2018. She was non-weightbearing for nearly 3-4 months. Subsequently she had a stroke in September o flast year which was a small left MCA embolic branch infarct secondary to atrial fibrillatio nwhich affected mainly her speech which improved. 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 She has finished home physical and occupational therapy but it is puzzling as to why she is still unable to walk well. She saw Dr. Franchot Erichsen funny to order an MRI scan of the brain which I personally reviewed and was done on 01/12/18 which shows a new lacunar infarct in addition to the one seen at the time of her stroke last year. The patient is on long-term anticoagulation given history of deep and thrombosis and pulmonary embolism. She was previously on eliquis which was changed after stroke to Xarelto. She had been on warfarin years ago. She appears to be tolerating Xarelto well without bleeding or bruising. She states her speech following her stroke has improved to occasionally she has some word hesitancy. She is more concerned about swallowing difficulties that she has noticed recently and has not had any evaluation for this. She has more difficulty swallowing certain foods like solids rather than liquids. She states her biggest difficulty walking is that she cannot trust her right leg which she is scared him and give out. She is bothered by arthritis in the right knee  and recently got a cortisone shot by orthopedic physician. She states her left hip has improved after surgery but her gait is not yet good. She also has history of chronic atrial fibrillation and has been on long-term anticoagulation.she denies any significant back pain, radicular pain, paresthesias or numbness in her feet. ROS:   14 system review of systems is positive for  Ringing in the ears, trouble swallowing, easy bruising, joint pain and swelling, aching muscles, urination problems, incontinence, allergies, depression, weakness, slurred speech, difficulty swallowing and all other systems negative PMH:  Past Medical History:  Diagnosis Date  . Arthritis   . Asthma   . DVT (deep venous thrombosis) ([facility]) 2011/2012  . Esophageal dysphagia    limited food and pill  . Esophageal reflux   . Essential hypertension, benign   . GERD (gastroesophageal reflux disease)   . Osteoporosis   . Overactive bladder   . Paroxysmal atrial fibrillation ([facility])    a. on Xarelto for anticoagulation  . Pseudoaneurysm ([place]) 09/2017  . Stroke ([place])   . Unspecified prolapse of vaginal walls     Social History:  Social History   Socioeconomic History  . Marital status: Married    Spouse name: Not on file  . Number of children: Not on file  . Years of education: Not on file  . Highest education level: Not on file  Occupational History  . Not on file  Social Needs  . Financial resource strain: Not on file  . Food insecurity:  Worry: Not on file    Inability: Not on file  . Transportation needs:    Medical: Not on file    Non-medical: Not on file  Tobacco Use  . Smoking status: Never Smoker  . Smokeless tobacco: Never Used  Substance and Sexual Activity  . Alcohol use: No    Alcohol/week: 0.0 oz  . Drug use: No  . Sexual activity: Not Currently    Birth control/protection: None  Lifestyle  . Physical activity:    Days per week: Not on file    Minutes per session: Not on file  . Stress:  Not on file  Relationships  . Social connections:    Talks on phone: Not on file    Gets together: Not on file    Attends religious service: Not on file    Active member of club or organization: Not on file    Attends meetings of clubs or organizations: Not on file    Relationship status: Not on file  . Intimate partner violence:    Fear of current or ex partner: Not on file    Emotionally abused: Not on file    Physically abused: Not on file    Forced sexual activity: Not on file  Other Topics Concern  . Not on file  Social History Narrative  . Not on file    Medications:   Current Outpatient Medications on File Prior to Visit  Medication Sig Dispense Refill  . acetaminophen (TYLENOL) 500 MG tablet Take 1,000 mg by mouth every 6 (six) hours.     Marland Kitchen diltiazem (CARDIZEM) 60 MG tablet Take 1 tablet (60 mg total) 2 (two) times daily by mouth. 180 tablet 1  . DULoxetine (CYMBALTA) 60 MG capsule Take 60 mg by mouth 2 (two) times daily.     Marland Kitchen esomeprazole (NEXIUM) 20 MG capsule Take 20 mg by mouth daily at 12 noon.    . Fluticasone-Salmeterol (ADVAIR) 250-50 MCG/DOSE AEPB Inhale 1 puff into the lungs 2 (two) times daily.    . metoprolol tartrate (LOPRESSOR) 25 MG tablet Take 1.5 tablets by mouth 2 (two) times daily.    . Rivaroxaban (XARELTO) 15 MG TABS tablet Take 15 mg by mouth daily with supper.    . vitamin B-12 (CYANOCOBALAMIN) 1000 MCG tablet Take 1,000 mcg by mouth daily.     No current facility-administered medications on file prior to visit.     Allergies:   Allergies  Allergen Reactions  . Latex Itching and Rash  . Adhesive [Tape] Other (See Comments)    Tears skin  . Betadine [Povidone Iodine]     blisters  . Ciprofloxacin Nausea And Vomiting  . Codeine Nausea And Vomiting    Upsets stomach, nightmares  . Penicillins Hives    Has patient had a PCN reaction causing immediate rash, facial/tongue/throat swelling, SOB or lightheadedness with hypotension: yes Has  patient had a PCN reaction causing severe rash involving mucus membranes or skin necrosis: yes Has patient had a PCN reaction that required hospitalization: no Has patient had a PCN reaction occurring within the last 10 years: no If all of the above answers are "NO", then may proceed with Cephalosporin use.   . Sulfa Antibiotics Hives  . Wellbutrin [Bupropion]     Reaction unknown    Physical Exam General: well developed, well nourished pleasant elderly Caucasian lady, seated, in no evident distress Head: head normocephalic and atraumatic.   Neck: supple with no carotid or supraclavicular bruits Cardiovascular: regular rate  and rhythm, no murmurs Musculoskeletal: right knee is swollen and painful Skin:  no rash/petichiae Vascular:  Normal pulses all extremities  Neurologic Exam Mental Status: Awake and fully alert.speech appears clear without aphasia or dysarthria. Oriented to place and time. Recent and remote memory intact.but diminished recall 2/3. Impaired calculation. Attention span, concentration and fund of knowledge appropriate. Mood and affect appropriate. Jaw jerk is brisk. Cranial Nerves: Fundoscopic exam reveals sharp disc margins. Pupils equal, briskly reactive to light. Extraocular movements full without nystagmus. Visual fields full to confrontation. Hearing intact but slightly diminished bilaterally.. Facial sensation intact. Face, tongue, palate moves normally and symmetrically.  Motor: Normal bulk and tone. Normal strength in all tested extremity muscles but diminished fine finger movements on the left. Orbits right over left upper extremity. Minimum weakness in left grip.Marland Kitchenone is slightly increased in the left leg   Sensory.: intact to touch , pinprick , position and vibratory sensation.except diminished touch pinprick sensation in the left eye anterior aspect  Coordination: Rapid alternating movements normal in all extremities. Finger-to-nose and heel-to-shin performed  accurately bilaterally. Gait and Station: Arises from chair with  difficulty. Stance is abnormal.with favoring the right leg. Walks with an antalgic gait with clearly favoring right knee.  Reflexes: 2+ and asymmetric and brisker on the left. Toes downgoing.   NIHSS  0 Modified Rankin  3   ASSESSMENT:  84 year old Caucasian lady with chronic gait difficulties likely multifactorial due to combination of hip arthritis and surgery, right knee arthritis and prior strokes and silent cerebrovascular disease.   PLAN: I had a long discussion with the patient and her daughter regarding her remote stroke, atrial fibrillation and her gait difficulty which is likely multifactorial due to combination of hip arthritis, multiple surgeries, right knee arthritis and late effect of previous strokes.I recommend she continue Xarelto for stroke prevention due to atrial fibrillation and maintain strict control of hypertension with blood pressure goal below 130/90 and lipids with LDL cholesterol goal below 70 mg percent. Check fasting lipid profile and hemoglobin A 1C. Check carotid ultrasound and ambulatory referral to physical therapy for gait and speech therapy for swallow evaluation. She was advised to use her walker at all times and be discussed fall and safety prevention precautions. >50% time during this 45 minute consultation visit was spent on counseling and coordination of care about her gait abnormality, strokes, atrial fibrillation discussion and answering questions.She will return for follow-up in 2 months or call earlier if necessary. Antony Contras, MD  [facility] [address] [place], [place] [address]  Phone [phone] Fax [phone] Note: This document was prepared with digital dictation and possible smart phrase technology. Any transcriptional errors that result from this process are unintentional.

## 2018-01-22 NOTE — Patient Instructions (Addendum)
I had a long discussion with the patient and her daughter regarding her remote stroke, atrial fibrillation and her gait difficulty which is likely multifactorial due to combination of hip arthritis, multiple surgeries, right knee arthritis and late effect of previous strokes.I recommend she continue Xarelto for stroke prevention due to atrial fibrillation and maintain strict control of hypertension with blood pressure goal below 130/90 and lipids with LDL cholesterol goal below 70 mg percent. Check fasting lipid profile and hemoglobin A 1C. Check carotid ultrasound and ambulatory referral to physical therapy for gait and speech therapy for swallow evaluation. She was advised to use her walker at all times and be discussed fall and safety prevention precautions. She will return for follow-up in 2 months or call earlier if necessary.  Fall Prevention in the Home Falls can cause injuries and can affect people from all age groups. There are many simple things that you can do to make your home safe and to help prevent falls. What can I do on the outside of my home?  Regularly repair the edges of walkways and driveways and fix any cracks.  Remove high doorway thresholds.  Trim any shrubbery on the main path into your home.  Use bright outdoor lighting.  Clear walkways of debris and clutter, including tools and rocks.  Regularly check that handrails are securely fastened and in good repair. Both sides of any steps should have handrails.  Install guardrails along the edges of any raised decks or porches.  Have leaves, snow, and ice cleared regularly.  Use sand or salt on walkways during winter months.  In the garage, clean up any spills right away, including grease or oil spills. What can I do in the bathroom?  Use night lights.  Install grab bars by the toilet and in the tub and shower. Do not use towel bars as grab bars.  Use non-skid mats or decals on the floor of the tub or shower.  If  you need to sit down while you are in the shower, use a plastic, non-slip stool.  Keep the floor dry. Immediately clean up any water that spills on the floor.  Remove soap buildup in the tub or shower on a regular basis.  Attach bath mats securely with double-sided non-slip rug tape.  Remove throw rugs and other tripping hazards from the floor. What can I do in the bedroom?  Use night lights.  Make sure that a bedside light is easy to reach.  Do not use oversized bedding that drapes onto the floor.  Have a firm chair that has side arms to use for getting dressed.  Remove throw rugs and other tripping hazards from the floor. What can I do in the kitchen?  Clean up any spills right away.  Avoid walking on wet floors.  Place frequently used items in easy-to-reach places.  If you need to reach for something above you, use a sturdy step stool that has a grab bar.  Keep electrical cables out of the way.  Do not use floor polish or wax that makes floors slippery. If you have to use wax, make sure that it is non-skid floor wax.  Remove throw rugs and other tripping hazards from the floor. What can I do in the stairways?  Do not leave any items on the stairs.  Make sure that there are handrails on both sides of the stairs. Fix handrails that are broken or loose. Make sure that handrails are as long as the stairways.  Check any carpeting to make sure that it is firmly attached to the stairs. Fix any carpet that is loose or worn.  Avoid having throw rugs at the top or bottom of stairways, or secure the rugs with carpet tape to prevent them from moving.  Make sure that you have a light switch at the top of the stairs and the bottom of the stairs. If you do not have them, have them installed. What are some other fall prevention tips?  Wear closed-toe shoes that fit well and support your feet. Wear shoes that have rubber soles or low heels.  When you use a stepladder, make sure  that it is completely opened and that the sides are firmly locked. Have someone hold the ladder while you are using it. Do not climb a closed stepladder.  Add color or contrast paint or tape to grab bars and handrails in your home. Place contrasting color strips on the first and last steps.  Use mobility aids as needed, such as canes, walkers, scooters, and crutches.  Turn on lights if it is dark. Replace any light bulbs that burn out.  Set up furniture so that there are clear paths. Keep the furniture in the same spot.  Fix any uneven floor surfaces.  Choose a carpet design that does not hide the edge of steps of a stairway.  Be aware of any and all pets.  Review your medicines with your healthcare provider. Some medicines can cause dizziness or changes in blood pressure, which increase your risk of falling. Talk with your health care provider about other ways that you can decrease your risk of falls. This may include working with a physical therapist or trainer to improve your strength, balance, and endurance. This information is not intended to replace advice given to you by your health care provider. Make sure you discuss any questions you have with your health care provider. Document Released: 09/20/2002 Document Revised: 02/27/2016 Document Reviewed: 11/04/2014 Elsevier Interactive Patient Education  [employer].

## 2018-01-23 LAB — LIPID PANEL
Chol/HDL Ratio: 2.6 ratio (ref 0.0–4.4)
Cholesterol, Total: 153 mg/dL (ref 100–199)
HDL: 58 mg/dL (ref >39)
LDL Calculated: 67 mg/dL (ref 0–99)
Triglycerides: 141 mg/dL (ref 0–149)
VLDL Cholesterol Cal: 28 mg/dL (ref 5–40)

## 2018-01-23 LAB — HEMOGLOBIN A1C
Est. average glucose Bld gHb Est-mCnc: 137 mg/dL
Hgb A1c MFr Bld: 6.4 % — ABNORMAL HIGH (ref 4.8–5.6)

## 2018-02-02 ENCOUNTER — Encounter: Payer: Self-pay | Admitting: Cardiology

## 2018-02-02 ENCOUNTER — Ambulatory Visit (INDEPENDENT_AMBULATORY_CARE_PROVIDER_SITE_OTHER): Payer: Medicare HMO | Admitting: Cardiology

## 2018-02-02 VITALS — BP 140/78 | HR 77 | Ht 60.0 in | Wt 146.0 lb

## 2018-02-02 DIAGNOSIS — I10 Essential (primary) hypertension: Secondary | ICD-10-CM

## 2018-02-02 DIAGNOSIS — I4891 Unspecified atrial fibrillation: Secondary | ICD-10-CM

## 2018-02-02 MED ORDER — DILTIAZEM HCL 60 MG PO TABS: 60.0000 mg | ORAL_TABLET | Freq: Three times a day (TID) | ORAL | 1 refills | Status: DC

## 2018-02-02 NOTE — Patient Instructions (Signed)
Your physician wants you to follow-up in: [place] will receive a reminder letter in the mail two months in advance. If you don't receive a letter, please call our office to schedule the follow-up appointment.  Your physician has recommended you make the following change in your medication:   HOLD METOPROLOL   INCREASE DILTIAZEM 60 MG THREE TIMES DAILY  UPDATE [place] ON Friday ON YOUR PALPITATIONS   WE HAVE GIVEN YOU AN ORDER FOR COMPRESSION STOCKINGS  Thank you for choosing [facility]!!

## 2018-02-02 NOTE — Progress Notes (Signed)
Clinical Summary Ms. Lemley is a 84 y.o.female seen today for follow up of the following medical problems.   1. Parox afib - on beta blocker for rate control, eliquis stroke prophylaxis. - previously we had icreased her lopressor to 50mg  bid from 37.5 mg bid. With change she reported worsening fluttering in her chest and fatigue, we cut dose back to 37.5mg  bid.and she felt better   - recent issues with palpitations - she has not tolerated higher does of lopressor in the past.  - compliant with meds  2. Dysphagia - followed by GI  3. HTN - compliant with emds   4. 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  - 01/2018 MRI showed new lacunar infarct. Followed by neuro, recs to continue xarelto   5. Hip fracture - fall 05/2017 leading to fracture - follow up showed she developed a pseduoanerusym in her left groin - s/p pseudoanerusym repair 09/2017. Incision site infection, has been on doxy.   Past Medical History:  Diagnosis Date  . Arthritis   . Asthma   . DVT (deep venous thrombosis) ([place]) 2011/2012  . Esophageal dysphagia    limited food and pill  . Esophageal reflux   . Essential hypertension, benign   . GERD (gastroesophageal reflux disease)   . Osteoporosis   . Overactive bladder   . Paroxysmal atrial fibrillation (HCC)    a. on Xarelto for anticoagulation  . Pseudoaneurysm ([place]) 09/2017  . Stroke ([place])   . Unspecified prolapse of vaginal walls      Allergies  Allergen Reactions  . Latex Itching and Rash  . Adhesive [Tape] Other (See Comments)    Tears skin  . Betadine [Povidone Iodine]     blisters  . Ciprofloxacin Nausea And Vomiting  . Codeine Nausea And Vomiting    Upsets stomach, nightmares  . Penicillins Hives    Has patient had a PCN reaction causing immediate rash, facial/tongue/throat swelling, SOB or lightheadedness with hypotension: yes Has patient  had a PCN reaction causing severe rash involving mucus membranes or skin necrosis: yes Has patient had a PCN reaction that required hospitalization: no Has patient had a PCN reaction occurring within the last 10 years: no If all of the above answers are "NO", then may proceed with Cephalosporin use.   . Sulfa Antibiotics Hives  . Wellbutrin [Bupropion]     Reaction unknown     Current Outpatient Medications  Medication Sig Dispense Refill  . acetaminophen (TYLENOL) 500 MG tablet Take 1,000 mg by mouth every 6 (six) hours.     Marland Kitchen diltiazem (CARDIZEM) 60 MG tablet Take 1 tablet (60 mg total) 2 (two) times daily by mouth. 180 tablet 1  . DULoxetine (CYMBALTA) 60 MG capsule Take 60 mg by mouth 2 (two) times daily.     Marland Kitchen esomeprazole (NEXIUM) 20 MG capsule Take 20 mg by mouth daily at 12 noon.    . Fluticasone-Salmeterol (ADVAIR) 250-50 MCG/DOSE AEPB Inhale 1 puff into the lungs 2 (two) times daily.    . metoprolol tartrate (LOPRESSOR) 25 MG tablet Take 1.5 tablets by mouth 2 (two) times daily.    . Rivaroxaban (XARELTO) 15 MG TABS tablet Take 15 mg by mouth daily with supper.    . vitamin B-12 (CYANOCOBALAMIN) 1000 MCG tablet Take 1,000 mcg by mouth daily.     No current facility-administered medications for this visit.  Past Surgical History:  Procedure Laterality Date  . ABDOMINAL HYSTERECTOMY    . COLONOSCOPY W/ BIOPSIES  02/01/2004   RMR: Anal papilla with normal rectum/Sigmoid diverticula/Small polyps in the cecum removed as described above. Inflammatory polyp  . ESOPHAGEAL DILATION     unknown year per patient.  . ESOPHAGEAL DILATION N/A 02/07/2015   RMR: Normal esophagus status post maloney dilation. Small hiatal hernia.   Marland Kitchen ESOPHAGOGASTRODUODENOSCOPY     unknown year per patient  . ESOPHAGOGASTRODUODENOSCOPY N/A 02/07/2015   RMR: Normal esophagus  status post  Maloney dilation. Small hiatal hernia  . FALSE ANEURYSM REPAIR Left 09/23/2017   Procedure: REPAIR FALSE ANEURYSM  COMMON LEFT FEMORAL ARTERY WITH OSTEOTOMY OF BONE FRAGEMENT;  Surgeon: Angelia Mould, MD;  Location: [facility];  Service: Vascular;  Laterality: Left;  . FEMUR IM NAIL Left 06/11/2017   Procedure: INTRAMEDULLARY (IM) NAIL FEMORAL;  Surgeon: Rod Can, MD;  Location: [facility];  Service: Orthopedics;  Laterality: Left;  . NASAL SEPTUM SURGERY    . TONSILLECTOMY    . WRIST FRACTURE SURGERY       Allergies  Allergen Reactions  . Latex Itching and Rash  . Adhesive [Tape] Other (See Comments)    Tears skin  . Betadine [Povidone Iodine]     blisters  . Ciprofloxacin Nausea And Vomiting  . Codeine Nausea And Vomiting    Upsets stomach, nightmares  . Penicillins Hives    Has patient had a PCN reaction causing immediate rash, facial/tongue/throat swelling, SOB or lightheadedness with hypotension: yes Has patient had a PCN reaction causing severe rash involving mucus membranes or skin necrosis: yes Has patient had a PCN reaction that required hospitalization: no Has patient had a PCN reaction occurring within the last 10 years: no If all of the above answers are "NO", then may proceed with Cephalosporin use.   . Sulfa Antibiotics Hives  . Wellbutrin [Bupropion]     Reaction unknown      Family History  Problem Relation Age of Onset  . Heart disease Mother   . Stroke Mother   . Emphysema Father   . Colon polyps Neg Hx   . Colon cancer Neg Hx      Social History Ms. Pyper reports that she has never smoked. She has never used smokeless tobacco. Ms. Scales reports that she does not drink alcohol.   Review of Systems CONSTITUTIONAL: No weight loss, fever, chills, weakness or fatigue.  HEENT: Eyes: No visual loss, blurred vision, double vision or yellow sclerae.No hearing loss, sneezing, congestion, runny nose or sore throat.  SKIN: No rash or itching.  CARDIOVASCULAR: per hpi RESPIRATORY: No shortness of breath, cough or sputum.  GASTROINTESTINAL: No anorexia, nausea,  vomiting or diarrhea. No abdominal pain or blood.  GENITOURINARY: No burning on urination, no polyuria NEUROLOGICAL: No headache, dizziness, syncope, paralysis, ataxia, numbness or tingling in the extremities. No change in bowel or bladder control.  MUSCULOSKELETAL: No muscle, back pain, joint pain or stiffness.  LYMPHATICS: No enlarged nodes. No history of splenectomy.  PSYCHIATRIC: No history of depression or anxiety.  ENDOCRINOLOGIC: No reports of sweating, cold or heat intolerance. No polyuria or polydipsia.  Marland Kitchen   Physical Examination Vitals:   02/02/18 1548  BP: 140/78  Pulse: 77  SpO2: 99%   Vitals:   02/02/18 1548  Weight: 146 lb (66.2 kg)  Height: 5' (1.524 m)    Gen: resting comfortably, no acute distress HEENT: no scleral icterus, pupils equal round and reactive, no  palptable cervical adenopathy,  CV: RRR, no m/r/g, no jvd Resp: Clear to auscultation bilaterally GI: abdomen is soft, non-tender, non-distended, normal bowel sounds, no hepatosplenomegaly MSK: extremities are warm, no edema.  Skin: warm, no rash Neuro:  no focal deficits Psych: appropriate affect   Diagnostic Studies  12/2013 Lexicscan MPI Impression Exercise Capacity: Lexiscan with no exercise.  BP Response: Normal blood pressure response.  Clinical Symptoms: Nausea  ECG Impression: No significant ST segment change suggestive of ischemia.  Comparison with Prior Nuclear Study: No previous nuclear study performed  Overall Impression: Normal stress nuclear study.  LV Ejection Fraction: 85%. LV Wall Motion: NL LV Function; NL Wall Motion    Jan 2015 echo Study Conclusions  - Study data: Technically difficult study. - Left ventricle: The cavity size was normal. Wall thickness was increased in a pattern of mild LVH. There is mild basal septal hypertrophy without obstructive gradient. Systolic function was normal. The estimated ejection fraction was in the range of 60% to 65%.  Doppler parameters are consistent with abnormal left ventricular relaxation (grade 1 diastolic dysfunction). - Aortic valve: Mildly calcified annulus. Trileaflet; mildly thickened leaflets. Trivial regurgitation. Valve area: 1.69cm^2(VTI). Valve area: 1.79cm^2 (Vmax). - Mitral valve: Mildly calcified annulus. Mildly thickened leaflets .   01/2015 Holter monitor No significant arrhythmias   07/2017 echo Study Conclusions  - Left ventricle: The cavity size was normal. There was mild focal basal hypertrophy of the septum. Systolic function was normal. The estimated ejection fraction was in the range of 55% to 60%. Doppler parameters are consistent with both elevated ventricular end-diastolic filling pressure and elevated left atrial filling pressure. - Mitral valve: Calcified annulus. Mildly thickened leaflets . - Right ventricle: The cavity size was mildly dilated. - Atrial septum: No defect or patent foramen ovale was identified.     Assessment and Plan   1. Parox afib - ongoing palpitations. She feels like in between dilt doses the medicien starts to wear off and that's when her symtpoms happen. Has not tolerated higher doses of beta blockers - we will try changing her dilt to 60mg  tid, hold lopressor for now. Other option would be to consider long acting dilt, however she is concerned about increased cost.  - continue xarelto for stroke prevention  2. HTN - mildly elevated, she is increasing diltiazem.        Arnoldo Lenis, M.D.

## 2018-02-03 ENCOUNTER — Other Ambulatory Visit: Payer: Self-pay | Admitting: *Deleted

## 2018-02-03 MED ORDER — RIVAROXABAN 15 MG PO TABS: 15.0000 mg | ORAL_TABLET | Freq: Every day | ORAL | 0 refills | Status: DC

## 2018-02-04 ENCOUNTER — Other Ambulatory Visit: Payer: Self-pay

## 2018-02-04 ENCOUNTER — Ambulatory Visit (HOSPITAL_COMMUNITY): Payer: Medicare HMO | Attending: Neurology | Admitting: Physical Therapy

## 2018-02-04 ENCOUNTER — Encounter: Payer: Self-pay | Admitting: Cardiology

## 2018-02-04 ENCOUNTER — Telehealth: Payer: Self-pay

## 2018-02-04 DIAGNOSIS — M6281 Muscle weakness (generalized): Secondary | ICD-10-CM | POA: Diagnosis present

## 2018-02-04 DIAGNOSIS — Z9181 History of falling: Secondary | ICD-10-CM | POA: Diagnosis present

## 2018-02-04 DIAGNOSIS — R293 Abnormal posture: Secondary | ICD-10-CM | POA: Insufficient documentation

## 2018-02-04 DIAGNOSIS — R262 Difficulty in walking, not elsewhere classified: Secondary | ICD-10-CM | POA: Diagnosis present

## 2018-02-04 NOTE — Therapy (Signed)
[facility] [place], [place], [address] Phone: [phone]   Fax:  [phone]  Physical Therapy Evaluation  Patient Details  Name: TREONNA KLEE MRN: [id number] Date of Birth: 08/06/1928 Referring Provider: Hurshel Party    Encounter Date: 02/04/2018  PT End of Session - 02/04/18 1614    Visit Number  1    Number of Visits  24    Date for PT Re-Evaluation  05/05/18 mini reassess every 10 visit     PT Start Time  1515    PT Stop Time  1600    PT Time Calculation (min)  45 min    Activity Tolerance  Treatment limited secondary to medical complications (Comment)    Behavior During Therapy  [facility] for tasks assessed/performed       Past Medical History:  Diagnosis Date  . Arthritis   . Asthma   . DVT (deep venous thrombosis) ([place]) 2011/2012  . Esophageal dysphagia    limited food and pill  . Esophageal reflux   . Essential hypertension, benign   . GERD (gastroesophageal reflux disease)   . Osteoporosis   . Overactive bladder   . Paroxysmal atrial fibrillation (HCC)    a. on Xarelto for anticoagulation  . Pseudoaneurysm ([place]) 09/2017  . Stroke ([place])   . Unspecified prolapse of vaginal walls     Past Surgical History:  Procedure Laterality Date  . ABDOMINAL HYSTERECTOMY    . COLONOSCOPY W/ BIOPSIES  02/01/2004   RMR: Anal papilla with normal rectum/Sigmoid diverticula/Small polyps in the cecum removed as described above. Inflammatory polyp  . ESOPHAGEAL DILATION     unknown year per patient.  . ESOPHAGEAL DILATION N/A 02/07/2015   RMR: Normal esophagus status post maloney dilation. Small hiatal hernia.   Marland Kitchen ESOPHAGOGASTRODUODENOSCOPY     unknown year per patient  . ESOPHAGOGASTRODUODENOSCOPY N/A 02/07/2015   RMR: Normal esophagus  status post  Maloney dilation. Small hiatal hernia  . FALSE ANEURYSM REPAIR Left 09/23/2017   Procedure: REPAIR FALSE ANEURYSM COMMON LEFT FEMORAL ARTERY WITH OSTEOTOMY OF BONE FRAGEMENT;   Surgeon: Angelia Mould, MD;  Location: [facility];  Service: Vascular;  Laterality: Left;  . FEMUR IM NAIL Left 06/11/2017   Procedure: INTRAMEDULLARY (IM) NAIL FEMORAL;  Surgeon: Rod Can, MD;  [facility];  Service: Orthopedics;  Laterality: Left;  . NASAL SEPTUM SURGERY    . TONSILLECTOMY    . WRIST FRACTURE SURGERY      There were no vitals filed for this visit.   Subjective Assessment - 02/04/18 1531    Subjective  Ms. Duffner states that she has had at least two silent strokes, the first was in October of 2018 she is unsure when she had her second stroke but it was prior to 09/18/2017 when she was admitted for a psedoaneurysm.   Prior to her first stroke she would occasionally use a cane due to right knee pain but not constantly .  She fx her left hip in three different parts on 05/2017 and was nonweight bearing for four months.      Pertinent History  CVA,     Limitations  Standing;Walking;House hold activities    How long can you sit comfortably?  No problem     How long can you stand comfortably?  not long due to right knee pain     How long can you walk comfortably?  less than five minutes     Patient Stated Goals  To walk better.     Currently in Pain?  Yes    Pain Score  4     Pain Location  Knee    Pain Orientation  Left    Pain Descriptors / Indicators  Throbbing    Pain Type  Chronic pain    Pain Onset  More than a month ago    Pain Frequency  Intermittent    Aggravating Factors   weight bearing     Pain Relieving Factors  activity          [facility] PT Assessment - 02/04/18 0001      Assessment   Medical Diagnosis  abnormal gait due to multiple factors.     Referring Provider  Gosrani Nimish     Onset Date/Surgical Date  06/13/17 for Lt hip     Next MD Visit  03/23/2018    Prior Therapy  home health until March 20th       Precautions   Precautions  Fall      Restrictions   Weight Bearing Restrictions  No      Balance Screen   Has the patient fallen  in the past 6 months  No    Has the patient had a decrease in activity level because of a fear of falling?   Yes    Is the patient reluctant to leave their home because of a fear of falling?   Yes      [place]  Private residence    Living Arrangements  Spouse/significant other    Available Help at Discharge  Family    Type of [place]  One level      Prior Function   Level of [place]  Retired    Leisure  none       Cognition   Overall Cognitive Status  Within Functional Limits for tasks assessed      Observation/Other Assessments   Focus on Therapeutic Outcomes (FOTO)   12      Functional Tests   Functional tests  Single leg stance;Sit to Stand      Single Leg Stance   Comments  unable       Sit to Stand   Comments  with UE assist takes 12"        ROM / Strength   AROM / PROM / Strength  Strength      Strength   Strength Assessment Site  Knee;Ankle    Right/Left Knee  Right;Left    Right Knee Extension  5/5    Left Knee Extension  5/5    Right/Left Ankle  Right;Left    Right Ankle Dorsiflexion  5/5    Left Ankle Dorsiflexion  5/5      Ambulation/Gait   Ambulation Distance (Feet)  90 Feet HR 155 cardiologist called     Assistive device  Rolling walker    Gait Pattern  Decreased step length - right;Decreased step length - left;Trunk flexed    Gait velocity  below for age                 Objective measurements completed on examination: See above findings.              PT Education - 02/04/18 1613    Education provided  Yes    Education Details  Decompession exercises 1-5  Person(s) Educated  Patient;Child(ren)    Methods  Explanation;Demonstration    Comprehension  Verbalized understanding       PT Short Term Goals - 02/04/18 1627      PT SHORT TERM GOAL #1   Title  Pt will be able to come from sit to stand one time in less  than 5 seconds     Time  4    Period  Weeks    Status  New    [place] Date  03/04/18      PT SHORT TERM GOAL #2   Title  Pt will be able to walk 180 ft in 3 minutes with rolling walker     Time  4    Period  Weeks    Status  New      PT SHORT TERM GOAL #3   Title  Pt will be able to stand for 5 minutes in order to complete her personal grooming.     Time  4    Period  Weeks    Status  New        PT Long Term Goals - 02/04/18 1631      PT LONG TERM GOAL #1   Title  PT will be able to come sit to stand without the use of her UE     Time  8    Period  Weeks    Status  New    [place] Date  04/01/18      PT LONG TERM GOAL #2   Title  PT will be able to stand without UE support for 10 minutes while using her hands for light activity such as doing dishes.     Time  8    Period  Weeks    Status  New      PT LONG TERM GOAL #3   Title  PT will be able to walk with her rolling walker for 15 mintue to be able to complete short shopping trips.     Time  8    Period  Weeks    Status  New      PT LONG TERM GOAL #4   Title  PT to feel confident walking in between rooms in her home using a cane     Time  8    Period  Weeks             Plan - 02/04/18 1616    Clinical Impression Statement  Ms. Alfredo is an 84 yo female who prior to 06/12/2017 was walking without an assistive device.  She fell and fx her LT femur in three places and was nonweight bearing for 3-4 months.  She then had two silent strokes and has undergone a repair for a psudeoanuerysm of her Lt femoral artery.  She is currently being referred to skilled outpatient therapy to improve her ambulation and reduce her risk of falling.  The pt took 3 minutes to walk 90 ft with a rolling walker, she was SOB at the end of the test.  Her HR was taken which was 155.  The pt denied chest pain.  Her cardiologist was called and  he felt she did not need to go to the ER he would call in a prescription.  Ms. Peedin will benefit from  skilled physical therapy after her heart rate is under control to improve her mobility, balalnce, strength and decrease her risk of falling.     History and Personal Factors  relevant to plan of care:  OA, osteoporosis, s/p LT hip fx, CVA, HTN,     Clinical Presentation  Stable    Clinical Decision Making  Moderate    Rehab Potential  Good    PT Frequency  2x / week    PT Duration  12 weeks    PT Treatment/Interventions  ADLs/Self Care Home Management;Aquatic Therapy;Gait training;Stair training;Therapeutic activities;Therapeutic exercise;Balance training;Patient/family education    PT Next Visit Plan  PT will need mm to B hip and give HEP.  Due to osteoporosis we will not work any single leg stance work balance with narrow base of support or tandem.  Begin with standing without UE assist , heel raises, functional squat, progress to decompression supine t-band exercises.     PT Home Exercise Plan  decompression 1-5    Recommended Other Services  speech    Consulted and Agree with Plan of Care  Patient;Family member/caregiver    Family Member Consulted  daughter       Patient will benefit from skilled therapeutic intervention in order to improve the following deficits and impairments:  Abnormal gait, Decreased activity tolerance, Decreased balance, Decreased coordination, Decreased mobility, Decreased range of motion, Decreased safety awareness, Decreased strength, Difficulty walking, Impaired flexibility, Postural dysfunction, Pain  Visit Diagnosis: History of falling - Plan: PT plan of care cert/re-cert  Muscle weakness (generalized) - Plan: PT plan of care cert/re-cert  Abnormal posture - Plan: PT plan of care cert/re-cert  Difficulty in walking, not elsewhere classified - Plan: PT plan of care cert/re-cert     Problem List Patient Active Problem List   Diagnosis Date Noted  . Pseudoaneurysm ([place]) 09/18/2017  . Acute CVA (cerebrovascular accident) ([place]) 07/23/2017  . Dysarthria  07/21/2017  . Tachycardia 07/21/2017  . Closed comminuted intertrochanteric fracture of left femur ([place]) 06/11/2017  . Diarrhea 01/03/2016  . IDA (iron deficiency anemia) 11/09/2015  . Dysphagia, pharyngoesophageal phase   . Hiatal hernia   . Constipation 01/16/2015  . Esophageal dysphagia 06/27/2014  . GERD (gastroesophageal reflux disease) 06/27/2014  . Personal history of colonic polyps 06/27/2014  . Chronic anticoagulation 06/27/2014  . Primary osteoarthritis of right knee 05/09/2014  . DISH (diffuse idiopathic skeletal hyperostosis) 05/09/2014  . DDD (degenerative disc disease), lumbosacral 05/09/2014  . Prolapse of vaginal vault after hysterectomy 04/07/2014  . Atrial fibrillation ([place]) 01/10/2014  . Radiculopathy 01/10/2014  . Essential hypertension 12/21/2013  Rayetta Humphrey, PT CLT [phone] 02/04/2018, 4:37 PM  [facility] [address] [place], [place], [address] Phone: [phone]   Fax:  [phone]  Name: JAELEEN INZUNZA MRN: [id number] Date of Birth: [birth year]

## 2018-02-04 NOTE — Telephone Encounter (Signed)
Daughter contacted office stating patient is at PT and before starting her HR is 150. PT refusing to do anything with patients HR that elevated. Patient reports her palpitations are worse and she has had some shortness of breath. Patient denies any chest pain. Patient has been holding the lopressor and taking the increase in diltiazem. Advised daughter if patient developed any chest pain or SHOB increased she needed to take patient to the ER. Daughter and patient verbalized understanding. Will send to provider for further recommendation.

## 2018-02-05 ENCOUNTER — Telehealth: Payer: Self-pay

## 2018-02-05 NOTE — Telephone Encounter (Signed)
Rn call patients daughter on dpr [name]  about her cholesterol profile was satisfactory. Her screening for blood test for diabetes suggests borderline risk. Advised patient to see PCP for follow up on HGA1c. The daughter states pt has appt in June 2019. Rn advised daughter to call PCP if patient needs to be seen sooner than June 2019.

## 2018-02-05 NOTE — Telephone Encounter (Signed)
-----   Message from Garvin Fila, MD sent at 02/05/2018  8:16 AM EDT ----- Sara Garcia inform the patient that her cholesterol profile was satisfactory. Her screening blood test for diabetes suggests she is at borderline risk. Advise her to discuss this with her primary physician.

## 2018-02-05 NOTE — Telephone Encounter (Signed)
Lab report fax to Dr. Anastasio Champion office at [phone]. Lab report receive and confirmed.

## 2018-02-05 NOTE — Telephone Encounter (Signed)
Verify she has been taking diltiazem 60mg  tid. If so I would have her restart her lopressor at the prior dose. I think its just her afib acting up and adding back the lopressor should help slow things down   Zandra Abts MD

## 2018-02-05 NOTE — Telephone Encounter (Signed)
Daughter notified and verbalized understanding.  

## 2018-02-09 ENCOUNTER — Telehealth (HOSPITAL_COMMUNITY): Payer: Self-pay | Admitting: Speech Pathology

## 2018-02-09 ENCOUNTER — Ambulatory Visit (HOSPITAL_COMMUNITY): Payer: Medicare HMO | Admitting: Speech Pathology

## 2018-02-09 NOTE — Telephone Encounter (Signed)
Mom is not feeling well her daughter called and canceled her appt.

## 2018-02-11 ENCOUNTER — Ambulatory Visit: Payer: Medicare HMO | Admitting: Physical Therapy

## 2018-02-11 ENCOUNTER — Ambulatory Visit (HOSPITAL_COMMUNITY): Payer: Medicare HMO | Attending: Neurology

## 2018-02-11 ENCOUNTER — Encounter (HOSPITAL_COMMUNITY): Payer: Self-pay | Admitting: Speech Pathology

## 2018-02-11 ENCOUNTER — Ambulatory Visit (HOSPITAL_COMMUNITY): Payer: Medicare HMO | Admitting: Speech Pathology

## 2018-02-11 ENCOUNTER — Other Ambulatory Visit: Payer: Self-pay

## 2018-02-11 ENCOUNTER — Encounter (HOSPITAL_COMMUNITY): Payer: Self-pay

## 2018-02-11 DIAGNOSIS — R1311 Dysphagia, oral phase: Secondary | ICD-10-CM | POA: Insufficient documentation

## 2018-02-11 DIAGNOSIS — M6281 Muscle weakness (generalized): Secondary | ICD-10-CM | POA: Insufficient documentation

## 2018-02-11 DIAGNOSIS — R293 Abnormal posture: Secondary | ICD-10-CM | POA: Insufficient documentation

## 2018-02-11 DIAGNOSIS — Z9181 History of falling: Secondary | ICD-10-CM

## 2018-02-11 DIAGNOSIS — R262 Difficulty in walking, not elsewhere classified: Secondary | ICD-10-CM | POA: Insufficient documentation

## 2018-02-11 NOTE — Therapy (Signed)
[facility] [address] [place], [place], [address] Phone: [phone]   Fax:  [phone]  Speech Language Pathology Evaluation  Patient Details  Name: Sara Garcia MRN: [id number] Date of Birth: [birth year] No data recorded  Encounter Date: 02/11/2018  End of Session - 02/11/18 1344    Visit Number  1    Number of Visits  1    Authorization Type  [employer] Medicare HMO; visits based on medical necessity    SLP Start Time  1120    SLP Stop Time   1215    SLP Time Calculation (min)  55 min    Activity Tolerance  Patient tolerated treatment well       Past Medical History:  Diagnosis Date  . Arthritis   . Asthma   . DVT (deep venous thrombosis) ([facility]) 2011/2012  . Esophageal dysphagia    limited food and pill  . Esophageal reflux   . Essential hypertension, benign   . GERD (gastroesophageal reflux disease)   . Osteoporosis   . Overactive bladder   . Paroxysmal atrial fibrillation ([facility])    a. on Xarelto for anticoagulation  . Pseudoaneurysm ([place]) 09/2017  . Stroke ([place] [place])   . Unspecified prolapse of vaginal walls     Past Surgical History:  Procedure Laterality Date  . ABDOMINAL HYSTERECTOMY    . COLONOSCOPY W/ BIOPSIES  02/01/2004   RMR: Anal papilla with normal rectum/Sigmoid diverticula/Small polyps in the cecum removed as described above. Inflammatory polyp  . ESOPHAGEAL DILATION     unknown year per patient.  . ESOPHAGEAL DILATION N/A 02/07/2015   RMR: Normal esophagus status post maloney dilation. Small hiatal hernia.   Marland Kitchen ESOPHAGOGASTRODUODENOSCOPY     unknown year per patient  . ESOPHAGOGASTRODUODENOSCOPY N/A 02/07/2015   RMR: Normal esophagus  status post  Maloney dilation. Small hiatal hernia  . FALSE ANEURYSM REPAIR Left 09/23/2017   Procedure: REPAIR FALSE ANEURYSM COMMON LEFT FEMORAL ARTERY WITH OSTEOTOMY OF BONE FRAGEMENT;  Surgeon: Angelia Mould, MD;  Location: [facility];  Service: Vascular;  Laterality: Left;   . FEMUR IM NAIL Left 06/11/2017   Procedure: INTRAMEDULLARY (IM) NAIL FEMORAL;  Surgeon: Rod Can, MD;  Location: [facility];  Service: Orthopedics;  Laterality: Left;  . NASAL SEPTUM SURGERY    . TONSILLECTOMY    . WRIST FRACTURE SURGERY      There were no vitals filed for this visit.  Subjective Assessment - 02/11/18 1333    Subjective  "I have trouble with meats, bread, and cornbread."    Patient is accompained by:  Family member Daughter    Currently in Pain?  No/denies       Prior Functional Status - 02/11/18 1335      Prior Functional Status   Cognitive/Linguistic Baseline  Within functional limits Dtr reports mild short term memory difficulty    Type of Home  House     Lives With  Spouse    Available Help at Discharge  Family    Vocation  Retired      General - 02/11/18 1336      General Information   Date of Onset  01/12/18    HPI  Sara Garcia is an 84 yo female who as referred for a clinical swallow evaluation by Dr. Antony Contras due to Pt's report of difficulty swallowing solids. Sara Garcia fractured her hip in August 2018, she had a stroke in September (small left MCA embolic branch infarct secondary  to atrial fibrillation, pseudoaneurysm at the site of her let hip surgery with another operation, and a new lacunar infarct identified by MRI on 01/12/2018. Mrs. Arzola received speech therapy following her hospitalizations (October and December), but noted increased difficulty swallowing in the past month or so.     Type of Study  Bedside Swallow Evaluation    Previous Swallow Assessment  None on record, but did have EGD in 2016 and barium swallow in 2015    Diet Prior to this Study  Dysphagia 3 (soft);Thin liquids    Temperature Spikes Noted  No    Respiratory Status  Room air    History of Recent Intubation  No    Behavior/Cognition  Alert;Cooperative;Pleasant mood    Oral Cavity Assessment  Dry    Oral Care Completed by SLP  No    Oral Cavity - Dentition  Dentures,  top;Dentures, bottom    Vision  Functional for self-feeding    Self-Feeding Abilities  Able to feed self    Patient Positioning  Upright in chair    Baseline Vocal Quality  Normal    Volitional Cough  Strong    Volitional Swallow  Able to elicit       Oral Motor/Sensory Function - 02/11/18 1343      Oral Motor/Sensory Function   Overall Oral Motor/Sensory Function  Within functional limits      Ice Chips - 02/11/18 1343      Ice Chips   Ice chips  Not tested      Thin Liquid - 02/11/18 1343      Thin Liquid   Thin Liquid  Within functional limits    Presentation  Cup;Self Fed;Straw      Nectar thick liquid - 02/11/18 1343      Nectar Thick Liquid   Nectar Thick Liquid  Not tested       Puree - 02/11/18 1343      Puree   Puree  Within functional limits    Presentation  Self Fed;Spoon      Solid - 02/11/18 1343      Solid   Solid  Impaired    Presentation  Self Fed    Oral Phase Impairments  Other (comment) xerostomia    Oral Phase Functional Implications  Prolonged oral transit          SLP Education - 02/11/18 1334    Education provided  Yes    Education Details  Pt and dtr provided with handout on safe swallow precautions, signs of aspiration, and soft food ideas.    Person(s) Educated  Patient;Child(ren)    Methods  Explanation;Handout    Comprehension  Verbalized understanding       SLP Short Term Goals - 02/11/18 1347      SLP SHORT TERM GOAL #1   Title  N/A eval only         Plan - 02/11/18 1345    Clinical Impression Statement  Clinical swallow evaluation completed in office. Mrs. Felan was accomanied to today's appointment by her daughter. Pt reports difficulty swallowing meats, breads, and cornbread. She has a history of esophageal dysphagia with the last dilation three years ago. Oral motor examination reveals extreme xerostomia with some cracking of lips. Tongue depressor adhered to buccal wall; Pt with upper/lower dentures which  appear to fit well. No gross asymmetry noted and oral musculature WFL. Pt without overt signs of reduced airway protection, however she did demonstrate prolonged oral transit with solids likely  due to xerostomia. Mechanical soft diet with thin liquids is recommended with encouragement to add moisture to foods and to use artificial saliva products (biotine, salivart) and to sip on water throughout the day.   MBSS was offered to Pt and daughter, however after discussion with SLP, they decided to try recommendations discussed in session today. I do not feel MBSS is necessary at this time given low suspicion of pharyngeal phase deficits, however this should be completed if Pt notes signs of symptoms of aspiration. Pt was given written information regarding signs/symptoms of aspiration, safe swallow strategies, and a list of soft food ideas. Additionally, she has my contact information and was encouraged to call with questions. No further SLP services indicated at this time, however MBSS can be completed here if desired at a later time. Pt and daughter were appreciative of recommendations and in agreement with plan of care.       Treatment/Interventions  Aspiration precaution training;SLP instruction and feedback;Patient/family education completed in session today    Consulted and Agree with Plan of Care  Patient       Patient will benefit from skilled therapeutic intervention in order to improve the following deficits and impairments:   Dysphagia, oral phase    Problem List Patient Active Problem List   Diagnosis Date Noted  . Pseudoaneurysm ([facility]) 09/18/2017  . Acute CVA (cerebrovascular accident) ([facility]) 07/23/2017  . Dysarthria 07/21/2017  . Tachycardia 07/21/2017  . Closed comminuted intertrochanteric fracture of left femur ([facility]) 06/11/2017  . Diarrhea 01/03/2016  . IDA (iron deficiency anemia) 11/09/2015  . Dysphagia, pharyngoesophageal phase   . Hiatal hernia   . Constipation 01/16/2015  .  Esophageal dysphagia 06/27/2014  . GERD (gastroesophageal reflux disease) 06/27/2014  . Personal history of colonic polyps 06/27/2014  . Chronic anticoagulation 06/27/2014  . Primary osteoarthritis of right knee 05/09/2014  . DISH (diffuse idiopathic skeletal hyperostosis) 05/09/2014  . DDD (degenerative disc disease), lumbosacral 05/09/2014  . Prolapse of vaginal vault after hysterectomy 04/07/2014  . Atrial fibrillation ([facility]) 01/10/2014  . Radiculopathy 01/10/2014  . Essential hypertension 12/21/2013   Thank you,  Genene Churn, [place]  [facility] 02/11/2018, 1:48 PM  [facility] [address] [place], [place], [address] Phone: [phone]   Fax:  [phone]  Name: KAMERIA CANIZARES MRN: [id number] Date of Birth: 1928/02/08

## 2018-02-11 NOTE — Therapy (Signed)
[facility] [place], [place], [address] Phone: [phone]   Fax:  [phone]  Physical Therapy Treatment  Patient Details  Name: Sara Garcia MRN: [id number] Date of Birth: 21-Jun-1928 Referring Provider: Hurshel Party    Encounter Date: 02/11/2018  PT End of Session - 02/11/18 1131    Visit Number  2    Number of Visits  24    Date for PT Re-Evaluation  05/05/18 minireassess every 10th visit    Authorization Type  Aetna Medicare    Authorization Time Period  4/26-->03/06/18    Authorization - Visit Number  2    Authorization - Number of Visits  10    PT Start Time  1034    PT Stop Time  1121    PT Time Calculation (min)  47 min    Equipment Utilized During Treatment  Gait belt    Activity Tolerance  Patient tolerated treatment well;No increased pain;Patient limited by fatigue    Behavior During Therapy  [facility] for tasks assessed/performed       Past Medical History:  Diagnosis Date  . Arthritis   . Asthma   . DVT (deep venous thrombosis) ([facility]) 2011/2012  . Esophageal dysphagia    limited food and pill  . Esophageal reflux   . Essential hypertension, benign   . GERD (gastroesophageal reflux disease)   . Osteoporosis   . Overactive bladder   . Paroxysmal atrial fibrillation ([facility])    a. on Xarelto for anticoagulation  . Pseudoaneurysm ([facility] [place]) 09/2017  . Stroke ([facility])   . Unspecified prolapse of vaginal walls     Past Surgical History:  Procedure Laterality Date  . ABDOMINAL HYSTERECTOMY    . COLONOSCOPY W/ BIOPSIES  02/01/2004   RMR: Anal papilla with normal rectum/Sigmoid diverticula/Small polyps in the cecum removed as described above. Inflammatory polyp  . ESOPHAGEAL DILATION     unknown year per patient.  . ESOPHAGEAL DILATION N/A 02/07/2015   RMR: Normal esophagus status post maloney dilation. Small hiatal hernia.   Marland Kitchen ESOPHAGOGASTRODUODENOSCOPY     unknown year per patient  . ESOPHAGOGASTRODUODENOSCOPY N/A  02/07/2015   RMR: Normal esophagus  status post  Maloney dilation. Small hiatal hernia  . FALSE ANEURYSM REPAIR Left 09/23/2017   Procedure: REPAIR FALSE ANEURYSM COMMON LEFT FEMORAL ARTERY WITH OSTEOTOMY OF BONE FRAGEMENT;  Surgeon: Angelia Mould, MD;  Location: [facility];  Service: Vascular;  Laterality: Left;  . FEMUR IM NAIL Left 06/11/2017   Procedure: INTRAMEDULLARY (IM) NAIL FEMORAL;  Surgeon: Rod Can, MD;  Location[facility];  Service: Orthopedics;  Laterality: Left;  . NASAL SEPTUM SURGERY    . TONSILLECTOMY    . WRIST FRACTURE SURGERY      There were no vitals filed for this visit.  Subjective Assessment - 02/11/18 1039    Subjective  Pt stated she has constant pain in general, Rt knee pain scale 7/10 today.  No reports of recent fall and has began HEP    Patient Stated Goals  To walk better.     Currently in Pain?  Yes    Pain Score  7     Pain Location  Knee    Pain Orientation  Right    Pain Descriptors / Indicators  Aching;Sore    Pain Type  Chronic pain    Pain Onset  More than a month ago    Pain Frequency  Intermittent    Aggravating Factors   weight  bearing    Pain Relieving Factors  activity         [facility] PT Assessment - 02/11/18 0001      Strength   Strength Assessment Site  Hip;Knee    Right/Left Hip  Right;Left    Right Hip Flexion  4+/5    Right Hip ABduction  3+/5    Left Hip Flexion  4/5    Left Hip ABduction  3-/5    Right/Left Knee  Right;Left    Right Knee Flexion  4-/5 seated     Left Knee Flexion  4-/5 seated                    [facility] Adult PT Treatment/Exercise - 02/11/18 0001      Exercises   Exercises  Knee/Hip      Knee/Hip Exercises: Standing   Heel Raises  10 reps    Heel Raises Limitations  Toe raises 10x     Functional Squat  10 reps      Knee/Hip Exercises: Supine   Other Supine Knee/Hip Exercises  Decompression exercises 1-5 (review HEP)          Balance Exercises - 02/11/18 1222      Balance  Exercises: Standing   Standing Eyes Opened  Narrow base of support (BOS);Solid surface;2 reps;Foam/compliant surface 2x 1 minute no HHA          PT Short Term Goals - 02/04/18 1627      PT SHORT TERM GOAL #1   Title  Pt will be able to come from sit to stand one time in less than 5 seconds     Time  4    Period  Weeks    Status  New    [place] Date  03/04/18      PT SHORT TERM GOAL #2   Title  Pt will be able to walk 180 ft in 3 minutes with rolling walker     Time  4    Period  Weeks    Status  New      PT SHORT TERM GOAL #3   Title  Pt will be able to stand for 5 minutes in order to complete her personal grooming.     Time  4    Period  Weeks    Status  New        PT Long Term Goals - 02/04/18 1631      PT LONG TERM GOAL #1   Title  PT will be able to come sit to stand without the use of her UE     Time  8    Period  Weeks    Status  New    [place] Date  04/01/18      PT LONG TERM GOAL #2   Title  PT will be able to stand without UE support for 10 minutes while using her hands for light activity such as doing dishes.     Time  8    Period  Weeks    Status  New      PT LONG TERM GOAL #3   Title  PT will be able to walk with her rolling walker for 15 mintue to be able to complete short shopping trips.     Time  8    Period  Weeks    Status  New      PT LONG TERM GOAL #4   Title  PT to feel confident  walking in between rooms in her home using a cane     Time  8    Period  Weeks            Plan - 02/11/18 1252    Clinical Impression Statement  Reviewed goals, assured compliance with HEP and copy of eval given to pt.  Pt unable to recall decompression exercises, reviewed form and technqiue and assured pt with copy at home.  MMT complete for hip strengthening (unable to tolerate prone position).  Session focus with LE strengthening and balance training without HHA.  Progressed to dynamic surface with NBOS with min guard for safety and cueing to look up and  improve spatial awareness to assist with balance activities.  No reoprts of pain through session, was limited by fatigue.      Rehab Potential  Good    PT Frequency  2x / week    PT Duration  12 weeks    PT Treatment/Interventions  ADLs/Self Care Home Management;Aquatic Therapy;Gait training;Stair training;Therapeutic activities;Therapeutic exercise;Balance training;Patient/family education    PT Next Visit Plan  Due to osteoporosis we will not work any single leg stance, work balance with narrow base of support or tandem.  Begin with standing without UE assist , heel raises, functional squat, progress to decompression supine t-band exercises.     PT Home Exercise Plan  decompression 1-5       Patient will benefit from skilled therapeutic intervention in order to improve the following deficits and impairments:  Abnormal gait, Decreased activity tolerance, Decreased balance, Decreased coordination, Decreased mobility, Decreased range of motion, Decreased safety awareness, Decreased strength, Difficulty walking, Impaired flexibility, Postural dysfunction, Pain  Visit Diagnosis: History of falling  Muscle weakness (generalized)  Abnormal posture  Difficulty in walking, not elsewhere classified     Problem List Patient Active Problem List   Diagnosis Date Noted  . Pseudoaneurysm ([facility]) 09/18/2017  . Acute CVA (cerebrovascular accident) ([facility]) 07/23/2017  . Dysarthria 07/21/2017  . Tachycardia 07/21/2017  . Closed comminuted intertrochanteric fracture of left femur ([facility]) 06/11/2017  . Diarrhea 01/03/2016  . IDA (iron deficiency anemia) 11/09/2015  . Dysphagia, pharyngoesophageal phase   . Hiatal hernia   . Constipation 01/16/2015  . Esophageal dysphagia 06/27/2014  . GERD (gastroesophageal reflux disease) 06/27/2014  . Personal history of colonic polyps 06/27/2014  . Chronic anticoagulation 06/27/2014  . Primary osteoarthritis of right knee 05/09/2014  . DISH (diffuse idiopathic  skeletal hyperostosis) 05/09/2014  . DDD (degenerative disc disease), lumbosacral 05/09/2014  . Prolapse of vaginal vault after hysterectomy 04/07/2014  . Atrial fibrillation ([place] [facility]) 01/10/2014  . Radiculopathy 01/10/2014  . Essential hypertension 12/21/2013   Ihor Austin, [place]; [place]  Aldona Lento 02/11/2018, 1:12 PM  [facility] [address] [place], [place], [address] Phone: [phone]   Fax:  [phone]  Name: NIRALI MAGOUIRK MRN: [id number] Date of Birth: [birth year]

## 2018-02-12 ENCOUNTER — Encounter (HOSPITAL_COMMUNITY): Payer: Self-pay | Admitting: Physical Therapy

## 2018-02-12 ENCOUNTER — Encounter (HOSPITAL_COMMUNITY): Payer: Medicare HMO | Admitting: Speech Pathology

## 2018-02-12 ENCOUNTER — Ambulatory Visit (HOSPITAL_COMMUNITY): Payer: Medicare HMO | Admitting: Physical Therapy

## 2018-02-12 DIAGNOSIS — R293 Abnormal posture: Secondary | ICD-10-CM

## 2018-02-12 DIAGNOSIS — R262 Difficulty in walking, not elsewhere classified: Secondary | ICD-10-CM

## 2018-02-12 DIAGNOSIS — Z9181 History of falling: Secondary | ICD-10-CM | POA: Diagnosis not present

## 2018-02-12 DIAGNOSIS — M6281 Muscle weakness (generalized): Secondary | ICD-10-CM

## 2018-02-12 NOTE — Therapy (Signed)
[place], [place], [address] Phone: [phone]   Fax:  [phone]  Physical Therapy Treatment  Patient Details  Name: Sara Garcia MRN: [id number] Date of Birth: [birth year] Referring Provider: Hurshel Garcia    Encounter Date: 02/12/2018  PT End of Session - 02/12/18 1548    Visit Number  3    Number of Visits  24    Date for PT Re-Evaluation  05/05/18 minireassess every 10th visit    Authorization Type  A[employer]na Medicare    Authorization Time Period  4/26-->03/06/18    Authorization - Visit Number  3    Authorization - Number of Visits  10    PT Start Time  1520    PT Stop Time  1600    PT Time Calculation (min)  40 min    Equipment Utilized During Treatment  Gait belt    Activity Tolerance  Patient tolerated treatment well;No increased pain;Patient limited by fatigue    Behavior During Therapy  [facility] for tasks assessed/performed       Past Medical History:  Diagnosis Date  . Arthritis   . Asthma   . DVT (deep venous thrombosis) ([place]) 2011/2012  . Esophageal dysphagia    limited food and pill  . Esophageal reflux   . Essential hypertension, benign   . GERD (gastroesophageal reflux disease)   . Osteoporosis   . Overactive bladder   . Paroxysmal atrial fibrillation (HCC)    a. on Xarelto for anticoagulation  . Pseudoaneurysm ([place]) 09/2017  . Stroke ([place])   . Unspecified prolapse of vaginal walls     Past Surgical History:  Procedure Laterality Date  . ABDOMINAL HYSTERECTOMY    . COLONOSCOPY W/ BIOPSIES  02/01/2004   RMR: Anal papilla with normal rectum/Sigmoid diverticula/Small polyps in the cecum removed as described above. Inflammatory polyp  . ESOPHAGEAL DILATION     unknown year per patient.  . ESOPHAGEAL DILATION N/A 02/07/2015   RMR: Normal esophagus status post maloney dilation. Small hiatal hernia.   Marland Kitchen ESOPHAGOGASTRODUODENOSCOPY     unknown year per patient  . ESOPHAGOGASTRODUODENOSCOPY N/A  02/07/2015   RMR: Normal esophagus  status post  Maloney dilation. Small hiatal hernia  . FALSE ANEURYSM REPAIR Left 09/23/2017   Procedure: REPAIR FALSE ANEURYSM COMMON LEFT FEMORAL ARTERY WITH OSTEOTOMY OF BONE FRAGEMENT;  Surgeon: Sara Mould, MD;  Location: [facility];  Service: Vascular;  Laterality: Left;  . FEMUR IM NAIL Left 06/11/2017   Procedure: INTRAMEDULLARY (IM) NAIL FEMORAL;  Surgeon: Rod Can, MD;  Location: [facility];  Service: Orthopedics;  Laterality: Left;  . NASAL SEPTUM SURGERY    . TONSILLECTOMY    . WRIST FRACTURE SURGERY      There were no vitals filed for this visit.  Subjective Assessment - 02/12/18 1522    Subjective  [name] states that she is not doing her exercises like she want to.     Pertinent History  CVA,     Limitations  Standing;Walking;House hold activities    How long can you sit comfortably?  No problem     How long can you stand comfortably?  not long due to right knee pain     How long can you walk comfortably?  less than five minutes     Patient Stated Goals  To walk better.     Currently in Pain?  Yes    Pain Score  -- unable to give a number  Pain Location  Knee    Pain Orientation  Right    Pain Descriptors / Indicators  Aching    Pain Type  Chronic pain    Pain Onset  More than a month ago    Pain Frequency  Intermittent    Aggravating Factors   weight bearing                        [facility] Adult PT Treatment/Exercise - 02/12/18 0001      Ambulation/Gait   Ambulation Distance (Feet)  30 Feet x2    Assistive device  Rolling walker    Gait Comments  working on keeping tall and constant walking no pause       Exercises   Exercises  Knee/Hip      Knee/Hip Exercises: Standing   Functional Squat  10 reps    Other Standing Knee Exercises  Marching in place x 5; wall arch x 5     Other Standing Knee Exercises  side stepping x 2 RT at the mat       Knee/Hip Exercises: Seated   Sit to Sand  5 reps           Balance Exercises - 02/11/18 1222      Balance Exercises: Standing   Standing Eyes Opened  Narrow base of support (BOS);Solid surface;2 reps;Foam/compliant surface 2x 1 minute no HHA          PT Short Term Goals - 02/12/18 1605      PT SHORT TERM GOAL #1   Title  Pt will be able to come from sit to stand one time in less than 5 seconds     Time  4    Period  Weeks    Status  Achieved      PT SHORT TERM GOAL #2   Title  Pt will be able to walk 180 ft in 3 minutes with rolling walker     Time  4    Period  Weeks    Status  On-going      PT SHORT TERM GOAL #3   Title  Pt will be able to stand for 5 minutes in order to complete her personal grooming.     Time  4    Period  Weeks    Status  On-going        PT Long Term Goals - 02/12/18 1606      PT LONG TERM GOAL #1   Title  PT will be able to come sit to stand without the use of her UE     Time  8    Period  Weeks    Status  On-going      PT LONG TERM GOAL #2   Title  PT will be able to stand without UE support for 10 minutes while using her hands for light activity such as doing dishes.     Time  8    Period  Weeks    Status  On-going      PT LONG TERM GOAL #3   Title  PT will be able to walk with her rolling walker for 15 mintue to be able to complete short shopping trips.     Time  8    Period  Weeks    Status  On-going      PT LONG TERM GOAL #4   Title  PT to feel confident walking in between rooms in  her home using a cane     Time  8    Period  Weeks    Status  On-going            Plan - 02/12/18 1548    Clinical Impression Statement  Treatment focused on keeping proper posture with exercises.  PT needed multiple rest breaks throughout treatment.  Pt encouraged to complete her HEP everyday.     Rehab Potential  Good    PT Frequency  2x / week    PT Duration  12 weeks    PT Treatment/Interventions  ADLs/Self Care Home Management;Aquatic Therapy;Gait training;Stair training;Therapeutic  activities;Therapeutic exercise;Balance training;Patient/family education    PT Next Visit Plan  Due to osteoporosis we will not work any single leg stance, work balance with narrow base of support or tandem.  progress to decompression supine t-band exercises.     PT Home Exercise Plan  decompression 1-5       Patient will benefit from skilled therapeutic intervention in order to improve the following deficits and impairments:  Abnormal gait, Decreased activity tolerance, Decreased balance, Decreased coordination, Decreased mobility, Decreased range of motion, Decreased safety awareness, Decreased strength, Difficulty walking, Impaired flexibility, Postural dysfunction, Pain  Visit Diagnosis: History of falling  Muscle weakness (generalized)  Abnormal posture  Difficulty in walking, not elsewhere classified     Problem List Patient Active Problem List   Diagnosis Date Noted  . Pseudoaneurysm ([place]) 09/18/2017  . Acute CVA (cerebrovascular accident) ([place]) 07/23/2017  . Dysarthria 07/21/2017  . Tachycardia 07/21/2017  . Closed comminuted intertrochanteric fracture of left femur ([place]) 06/11/2017  . Diarrhea 01/03/2016  . IDA (iron deficiency anemia) 11/09/2015  . Dysphagia, pharyngoesophageal phase   . Hiatal hernia   . Constipation 01/16/2015  . Esophageal dysphagia 06/27/2014  . GERD (gastroesophageal reflux disease) 06/27/2014  . Personal history of colonic polyps 06/27/2014  . Chronic anticoagulation 06/27/2014  . Primary osteoarthritis of right knee 05/09/2014  . DISH (diffuse idiopathic skeletal hyperostosis) 05/09/2014  . DDD (degenerative disc disease), lumbosacral 05/09/2014  . Prolapse of vaginal vault after hysterectomy 04/07/2014  . Atrial fibrillation ([place]) 01/10/2014  . Radiculopathy 01/10/2014  . Essential hypertension 12/21/2013   Rayetta Humphrey, PT CLT [phone] 02/12/2018, 4:06 PM  [place], [place], [address] Phone: [phone]   Fax:  [phone]  Name: Sara Garcia MRN: [id number] Date of Birth: 10-08-1928

## 2018-02-16 ENCOUNTER — Encounter (HOSPITAL_COMMUNITY): Payer: Medicare HMO | Admitting: Speech Pathology

## 2018-02-16 ENCOUNTER — Ambulatory Visit (HOSPITAL_COMMUNITY): Payer: Medicare HMO | Admitting: Physical Therapy

## 2018-02-16 DIAGNOSIS — Z9181 History of falling: Secondary | ICD-10-CM | POA: Diagnosis not present

## 2018-02-16 DIAGNOSIS — M6281 Muscle weakness (generalized): Secondary | ICD-10-CM

## 2018-02-16 DIAGNOSIS — R262 Difficulty in walking, not elsewhere classified: Secondary | ICD-10-CM

## 2018-02-16 DIAGNOSIS — R293 Abnormal posture: Secondary | ICD-10-CM

## 2018-02-16 NOTE — Therapy (Signed)
[place], [place], [address] Phone: [phone]   Fax:  [phone]  Physical Therapy Treatment  Patient Details  Name: Sara Garcia MRN: [id number] Date of Birth: 1927-11-11 Referring Provider: Hurshel Party    Encounter Date: 02/16/2018  PT End of Session - 02/16/18 1432    Visit Number  4    Number of Visits  24    Date for PT Re-Evaluation  05/05/18 minireassess every 10th visit    Authorization Type  Aetna Medicare    Authorization Time Period  4/26-->03/06/18    Authorization - Visit Number  4    Authorization - Number of Visits  10    PT Start Time  1941    PT Stop Time  1430    PT Time Calculation (min)  42 min    Equipment Utilized During Treatment  Gait belt    Activity Tolerance  Patient tolerated treatment well;No increased pain;Patient limited by fatigue    Behavior During Therapy  [facility] for tasks assessed/performed       Past Medical History:  Diagnosis Date  . Arthritis   . Asthma   . DVT (deep venous thrombosis) ([place]) 2011/2012  . Esophageal dysphagia    limited food and pill  . Esophageal reflux   . Essential hypertension, benign   . GERD (gastroesophageal reflux disease)   . Osteoporosis   . Overactive bladder   . Paroxysmal atrial fibrillation (HCC)    a. on Xarelto for anticoagulation  . Pseudoaneurysm ([place]) 09/2017  . Stroke ([place])   . Unspecified prolapse of vaginal walls     Past Surgical History:  Procedure Laterality Date  . ABDOMINAL HYSTERECTOMY    . COLONOSCOPY W/ BIOPSIES  02/01/2004   RMR: Anal papilla with normal rectum/Sigmoid diverticula/Small polyps in the cecum removed as described above. Inflammatory polyp  . ESOPHAGEAL DILATION     unknown year per patient.  . ESOPHAGEAL DILATION N/A 02/07/2015   RMR: Normal esophagus status post maloney dilation. Small hiatal hernia.   Marland Kitchen ESOPHAGOGASTRODUODENOSCOPY     unknown year per patient  . ESOPHAGOGASTRODUODENOSCOPY N/A  02/07/2015   RMR: Normal esophagus  status post  Maloney dilation. Small hiatal hernia  . FALSE ANEURYSM REPAIR Left 09/23/2017   Procedure: REPAIR FALSE ANEURYSM COMMON LEFT FEMORAL ARTERY WITH OSTEOTOMY OF BONE FRAGEMENT;  Surgeon: Angelia Mould, MD;  Location: [facility];  Service: Vascular;  Laterality: Left;  . FEMUR IM NAIL Left 06/11/2017   Procedure: INTRAMEDULLARY (IM) NAIL FEMORAL;  Surgeon: Rod Can, MD;  [facility];  Service: Orthopedics;  Laterality: Left;  . NASAL SEPTUM SURGERY    . TONSILLECTOMY    . WRIST FRACTURE SURGERY      There were no vitals filed for this visit.  Subjective Assessment - 02/16/18 1440    Subjective  pt in good spirits, howver states she is slow moving and her Rt knee conintues to bother her.    Currently in Pain?  Yes    Pain Score  3     Pain Location  Knee    Pain Orientation  Right                       [facility] Adult PT Treatment/Exercise - 02/16/18 0001      Ambulation/Gait   Ambulation Distance (Feet)  186 Feet    Assistive device  Rolling walker    Gait Comments  cues for steady  pace and stride with upright posturing no stopping breaks, completed in 4 minutes.      Knee/Hip Exercises: Standing   Heel Raises  15 reps    Heel Raises Limitations  Toe raises 15x     Functional Squat  15 reps    SLS  tandem stance on foam 30" each with intermittent hand taps on parallel bars    Other Standing Knee Exercises  Marching in place x 10; wall arch x 0     Other Standing Knee Exercises  side stepping x 1 RT at line with therapist HHA      Knee/Hip Exercises: Seated   Sit to Sand  5 reps               PT Short Term Goals - 02/12/18 1605      PT SHORT TERM GOAL #1   Title  Pt will be able to come from sit to stand one time in less than 5 seconds     Time  4    Period  Weeks    Status  Achieved      PT SHORT TERM GOAL #2   Title  Pt will be able to walk 180 ft in 3 minutes with rolling walker      Time  4    Period  Weeks    Status  On-going      PT SHORT TERM GOAL #3   Title  Pt will be able to stand for 5 minutes in order to complete her personal grooming.     Time  4    Period  Weeks    Status  On-going        PT Long Term Goals - 02/12/18 1606      PT LONG TERM GOAL #1   Title  PT will be able to come sit to stand without the use of her UE     Time  8    Period  Weeks    Status  On-going      PT LONG TERM GOAL #2   Title  PT will be able to stand without UE support for 10 minutes while using her hands for light activity such as doing dishes.     Time  8    Period  Weeks    Status  On-going      PT LONG TERM GOAL #3   Title  PT will be able to walk with her rolling walker for 15 mintue to be able to complete short shopping trips.     Time  8    Period  Weeks    Status  On-going      PT LONG TERM GOAL #4   Title  PT to feel confident walking in between rooms in her home using a cane     Time  8    Period  Weeks    Status  On-going            Plan - 02/16/18 1433    Clinical Impression Statement  continued with established therex, working on gait making larger steps, improved posturing and cadence.  Pt able to complete full 4 minutes covering 186 feet.  continued with bilateral LE static balance actvities, adding tandem on foam with noted difficulty.  Progressed out to line for side stepping, hwoever audible cracking in Rt knee with actvity and voiced pain at times.  Unable to complete all therex or review decompression exercises due to time  of session.      Rehab Potential  Good    PT Frequency  2x / week    PT Duration  12 weeks    PT Treatment/Interventions  ADLs/Self Care Home Management;Aquatic Therapy;Gait training;Stair training;Therapeutic activities;Therapeutic exercise;Balance training;Patient/family education    PT Next Visit Plan  Due to osteoporosis we will not work any single leg stance.  Next session review decompression exercises and  progress to decompression supine t-band exercises.     PT Home Exercise Plan  decompression 1-5       Patient will benefit from skilled therapeutic intervention in order to improve the following deficits and impairments:  Abnormal gait, Decreased activity tolerance, Decreased balance, Decreased coordination, Decreased mobility, Decreased range of motion, Decreased safety awareness, Decreased strength, Difficulty walking, Impaired flexibility, Postural dysfunction, Pain  Visit Diagnosis: History of falling  Muscle weakness (generalized)  Abnormal posture  Difficulty in walking, not elsewhere classified     Problem List Patient Active Problem List   Diagnosis Date Noted  . Pseudoaneurysm ([place]) 09/18/2017  . Acute CVA (cerebrovascular accident) ([place]) 07/23/2017  . Dysarthria 07/21/2017  . Tachycardia 07/21/2017  . Closed comminuted intertrochanteric fracture of left femur ([place]) 06/11/2017  . Diarrhea 01/03/2016  . IDA (iron deficiency anemia) 11/09/2015  . Dysphagia, pharyngoesophageal phase   . Hiatal hernia   . Constipation 01/16/2015  . Esophageal dysphagia 06/27/2014  . GERD (gastroesophageal reflux disease) 06/27/2014  . Personal history of colonic polyps 06/27/2014  . Chronic anticoagulation 06/27/2014  . Primary osteoarthritis of right knee 05/09/2014  . DISH (diffuse idiopathic skeletal hyperostosis) 05/09/2014  . DDD (degenerative disc disease), lumbosacral 05/09/2014  . Prolapse of vaginal vault after hysterectomy 04/07/2014  . Atrial fibrillation ([place]) 01/10/2014  . Radiculopathy 01/10/2014  . Essential hypertension 12/21/2013   Teena Irani, PTA/CLT [phone]  Teena Irani 02/16/2018, 2:41 PM  [place], [place], [address] Phone: [phone]   Fax:  [phone]  Name: Sara Garcia MRN: [id number] Date of Birth: 1927/12/08

## 2018-02-18 ENCOUNTER — Ambulatory Visit (HOSPITAL_COMMUNITY): Payer: Medicare HMO | Admitting: Physical Therapy

## 2018-02-18 ENCOUNTER — Telehealth (HOSPITAL_COMMUNITY): Payer: Self-pay | Admitting: Internal Medicine

## 2018-02-18 NOTE — Telephone Encounter (Signed)
02/18/18  [name] called to cx and said they would be here at the next appt.

## 2018-02-19 ENCOUNTER — Ambulatory Visit (HOSPITAL_COMMUNITY): Payer: Medicare HMO | Admitting: Physical Therapy

## 2018-02-19 ENCOUNTER — Encounter (HOSPITAL_COMMUNITY): Payer: Medicare HMO | Admitting: Speech Pathology

## 2018-02-23 ENCOUNTER — Encounter (HOSPITAL_COMMUNITY): Payer: Medicare HMO | Admitting: Speech Pathology

## 2018-02-23 ENCOUNTER — Ambulatory Visit (HOSPITAL_COMMUNITY): Payer: Medicare HMO | Admitting: Physical Therapy

## 2018-02-23 DIAGNOSIS — R262 Difficulty in walking, not elsewhere classified: Secondary | ICD-10-CM

## 2018-02-23 DIAGNOSIS — Z9181 History of falling: Secondary | ICD-10-CM

## 2018-02-23 DIAGNOSIS — M6281 Muscle weakness (generalized): Secondary | ICD-10-CM

## 2018-02-23 DIAGNOSIS — R293 Abnormal posture: Secondary | ICD-10-CM

## 2018-02-23 NOTE — Therapy (Signed)
[place], [place], [address] Phone: [phone]   Fax:  [phone]  Physical Therapy Treatment  Patient Details  Name: Sara Garcia MRN: [id number] Date of Birth: Jul 15, 1928 Referring Provider: Hurshel Party    Encounter Date: 02/23/2018  PT End of Session - 02/23/18 1526    Visit Number  5    Number of Visits  24    Date for PT Re-Evaluation  05/05/18 minireassess every 10th visit    Authorization Type  Aetna Medicare    Authorization Time Period  4/26-->03/06/18    Authorization - Visit Number  5    Authorization - Number of Visits  10    PT Start Time  1430    PT Stop Time  1515    PT Time Calculation (min)  45 min    Equipment Utilized During Treatment  Gait belt    Activity Tolerance  Patient tolerated treatment well;No increased pain;Patient limited by fatigue    Behavior During Therapy  [facility] for tasks assessed/performed       Past Medical History:  Diagnosis Date  . Arthritis   . Asthma   . DVT (deep venous thrombosis) ([place]) 2011/2012  . Esophageal dysphagia    limited food and pill  . Esophageal reflux   . Essential hypertension, benign   . GERD (gastroesophageal reflux disease)   . Osteoporosis   . Overactive bladder   . Paroxysmal atrial fibrillation (HCC)    a. on Xarelto for anticoagulation  . Pseudoaneurysm ([place]) 09/2017  . Stroke ([place])   . Unspecified prolapse of vaginal walls     Past Surgical History:  Procedure Laterality Date  . ABDOMINAL HYSTERECTOMY    . COLONOSCOPY W/ BIOPSIES  02/01/2004   RMR: Anal papilla with normal rectum/Sigmoid diverticula/Small polyps in the cecum removed as described above. Inflammatory polyp  . ESOPHAGEAL DILATION     unknown year per patient.  . ESOPHAGEAL DILATION N/A 02/07/2015   RMR: Normal esophagus status post maloney dilation. Small hiatal hernia.   Marland Kitchen ESOPHAGOGASTRODUODENOSCOPY     unknown year per patient  . ESOPHAGOGASTRODUODENOSCOPY N/A  02/07/2015   RMR: Normal esophagus  status post  Maloney dilation. Small hiatal hernia  . FALSE ANEURYSM REPAIR Left 09/23/2017   Procedure: REPAIR FALSE ANEURYSM COMMON LEFT FEMORAL ARTERY WITH OSTEOTOMY OF BONE FRAGEMENT;  Surgeon: Angelia Mould, MD;  Location: [facility];  Service: Vascular;  Laterality: Left;  . FEMUR IM NAIL Left 06/11/2017   Procedure: INTRAMEDULLARY (IM) NAIL FEMORAL;  Surgeon: Rod Can, MD;  [facility];  Service: Orthopedics;  Laterality: Left;  . NASAL SEPTUM SURGERY    . TONSILLECTOMY    . WRIST FRACTURE SURGERY      There were no vitals filed for this visit.  Subjective Assessment - 02/23/18 1434    Subjective  PT states her Rt knee conitnues to bother her and goes all the way down her leg.  7/10 pain reported    Currently in Pain?  Yes    Pain Score  7     Pain Location  Knee    Pain Orientation  Right    Pain Descriptors / Indicators  Aching    Pain Radiating Towards  down toward foot on Rt LE    Pain Frequency  Occasional                       [facility] Adult PT Treatment/Exercise - 02/23/18  0001      Ambulation/Gait   Ambulation Distance (Feet)  226 Feet 5'45" to complete     Assistive device  Rolling walker    Gait Pattern  Decreased stride length;Trendelenburg;Lateral hip instability;Trunk flexed;Narrow base of support    Ambulation Surface  Level    Gait velocity  0.2 m/sec    Gait Comments  cues for steady pace and stride with upright posturing no stopping breaks, completed in 4 minutes.      Knee/Hip Exercises: Standing   Heel Raises  15 reps    Heel Raises Limitations  Toe raises 15x     Hip Abduction  Both;10 reps    SLS  tandem stance on foam 30" each with intermittent hand taps on parallel bars    Other Standing Knee Exercises  side stepping x 2 RT inside bars with therapist HHA      Knee/Hip Exercises: Seated   Sit to Sand  5 reps               PT Short Term Goals - 02/12/18 1605      PT SHORT  TERM GOAL #1   Title  Pt will be able to come from sit to stand one time in less than 5 seconds     Time  4    Period  Weeks    Status  Achieved      PT SHORT TERM GOAL #2   Title  Pt will be able to walk 180 ft in 3 minutes with rolling walker     Time  4    Period  Weeks    Status  On-going      PT SHORT TERM GOAL #3   Title  Pt will be able to stand for 5 minutes in order to complete her personal grooming.     Time  4    Period  Weeks    Status  On-going        PT Long Term Goals - 02/12/18 1606      PT LONG TERM GOAL #1   Title  PT will be able to come sit to stand without the use of her UE     Time  8    Period  Weeks    Status  On-going      PT LONG TERM GOAL #2   Title  PT will be able to stand without UE support for 10 minutes while using her hands for light activity such as doing dishes.     Time  8    Period  Weeks    Status  On-going      PT LONG TERM GOAL #3   Title  PT will be able to walk with her rolling walker for 15 mintue to be able to complete short shopping trips.     Time  8    Period  Weeks    Status  On-going      PT LONG TERM GOAL #4   Title  PT to feel confident walking in between rooms in her home using a cane     Time  8    Period  Weeks    Status  On-going            Plan - 02/23/18 1526    Clinical Impression Statement  Continued general slow mobiltiy with constant cues for larger steps and regaining upright posturing.  PT also with tendency to walk too far into walker requiring  cues to complete ambulation with improved safety.  Able to coimplete full lap in 5;45" today using RW.  Standing strengthening exericses with added hip abduction today as noted trendelenburg with gait.  PT required 2 seated rest breaks during session today.  No pain or issues voiced during or after session.      Rehab Potential  Good    PT Frequency  2x / week    PT Duration  12 weeks    PT Treatment/Interventions  ADLs/Self Care Home Management;Aquatic  Therapy;Gait training;Stair training;Therapeutic activities;Therapeutic exercise;Balance training;Patient/family education    PT Next Visit Plan  Due to osteoporosis we will not work any single leg stance.  Next session begin with review decompression exercises and progress to decompression supine t-band exercises. Continue to progress functional mobiltiy improving gait speed and safety.      PT Home Exercise Plan  decompression 1-5       Patient will benefit from skilled therapeutic intervention in order to improve the following deficits and impairments:  Abnormal gait, Decreased activity tolerance, Decreased balance, Decreased coordination, Decreased mobility, Decreased range of motion, Decreased safety awareness, Decreased strength, Difficulty walking, Impaired flexibility, Postural dysfunction, Pain  Visit Diagnosis: History of falling  Muscle weakness (generalized)  Abnormal posture  Difficulty in walking, not elsewhere classified     Problem List Patient Active Problem List   Diagnosis Date Noted  . Pseudoaneurysm ([place]) 09/18/2017  . Acute CVA (cerebrovascular accident) ([place]) 07/23/2017  . Dysarthria 07/21/2017  . Tachycardia 07/21/2017  . Closed comminuted intertrochanteric fracture of left femur ([place]) 06/11/2017  . Diarrhea 01/03/2016  . IDA (iron deficiency anemia) 11/09/2015  . Dysphagia, pharyngoesophageal phase   . Hiatal hernia   . Constipation 01/16/2015  . Esophageal dysphagia 06/27/2014  . GERD (gastroesophageal reflux disease) 06/27/2014  . Personal history of colonic polyps 06/27/2014  . Chronic anticoagulation 06/27/2014  . Primary osteoarthritis of right knee 05/09/2014  . DISH (diffuse idiopathic skeletal hyperostosis) 05/09/2014  . DDD (degenerative disc disease), lumbosacral 05/09/2014  . Prolapse of vaginal vault after hysterectomy 04/07/2014  . Atrial fibrillation ([place]) 01/10/2014  . Radiculopathy 01/10/2014  . Essential hypertension 12/21/2013    Teena Irani, PTA/CLT [phone]  Teena Irani 02/23/2018, 3:32 PM  [place], [place], [address] Phone: [phone]   Fax:  [phone]  Name: Sara Garcia MRN: [id number] Date of Birth: 12/19/1927

## 2018-02-25 ENCOUNTER — Ambulatory Visit (HOSPITAL_COMMUNITY): Payer: Medicare HMO | Admitting: Physical Therapy

## 2018-02-25 ENCOUNTER — Other Ambulatory Visit: Payer: Self-pay | Admitting: Gastroenterology

## 2018-02-26 ENCOUNTER — Encounter (HOSPITAL_COMMUNITY): Payer: Medicare HMO | Admitting: Speech Pathology

## 2018-02-26 ENCOUNTER — Ambulatory Visit (HOSPITAL_COMMUNITY): Payer: Medicare HMO | Admitting: Physical Therapy

## 2018-02-26 ENCOUNTER — Telehealth (HOSPITAL_COMMUNITY): Payer: Self-pay | Admitting: Internal Medicine

## 2018-02-26 NOTE — Telephone Encounter (Signed)
02/26/18  I called and confirmed that Sara Garcia did want to cx appt, her husband is in the hospital

## 2018-03-03 ENCOUNTER — Other Ambulatory Visit: Payer: Self-pay | Admitting: *Deleted

## 2018-03-03 MED ORDER — RIVAROXABAN 15 MG PO TABS: 15.0000 mg | ORAL_TABLET | Freq: Every day | ORAL | 0 refills | Status: DC

## 2018-03-16 ENCOUNTER — Encounter: Payer: Self-pay | Admitting: Neurology

## 2018-03-16 ENCOUNTER — Ambulatory Visit: Payer: Medicare HMO | Admitting: Neurology

## 2018-03-16 VITALS — BP 122/61 | HR 60 | Ht 60.0 in | Wt 145.2 lb

## 2018-03-16 DIAGNOSIS — R269 Unspecified abnormalities of gait and mobility: Secondary | ICD-10-CM

## 2018-03-16 NOTE — Progress Notes (Signed)
[facility] [address] [place]. [place] [address] [phone]       OFFICE CONSULT NOTE  Ms. CHAUNDRA ABREU Date of Birth:  [birth year] Medical Record Number:  [id number]   Referring MD:  Hurshel Party    Reason for Referral:  Gait abnormality  HPI: Initial visit 01/22/18 : Ms Tellefsen  is a pleasant 84 year lady who is seen today for the initial consultation visit upon referral from Dr. Anastasio Champion. The patient states that she has had long-standing walking difficulties following her left hip fracture in August 2018. She was non-weightbearing for nearly 3-4 months. Subsequently she had a stroke in September o flast year which was a small left MCA embolic branch infarct secondary to atrial fibrillatio nwhich affected mainly her speech which improved. 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 She has finished home physical and occupational therapy but it is puzzling as to why she is still unable to walk well. She saw Dr. Franchot Erichsen funny to order an MRI scan of the brain which I personally reviewed and was done on 01/12/18 which shows a new lacunar infarct in addition to the one seen at the time of her stroke last year. The patient is on long-term anticoagulation given history of deep and thrombosis and pulmonary embolism. She was previously on eliquis which was changed after stroke to Xarelto. She had been on warfarin years ago. She appears to be tolerating Xarelto well without bleeding or bruising. She states her speech following her stroke has improved to occasionally she has some word hesitancy. She is more concerned about swallowing difficulties that she has noticed recently and has not had any evaluation for this. She has more difficulty swallowing certain foods like solids rather than liquids. She states her biggest difficulty walking is that she cannot trust her right leg which she is scared him and give out. She is bothered by  arthritis in the right knee and recently got a cortisone shot by orthopedic physician. She states her left hip has improved after surgery but her gait is not yet good. She also has history of chronic atrial fibrillation and has been on long-term anticoagulation.she denies any significant back pain, radicular pain, paresthesias or numbness in her feet. Update 03/16/2018 : She returns for follow-up after last visit 2 months ago.  She is accompanied by her daughter.  Patient started physical therapy but stopped after a few weeks due to not being able to afford the co-pay as well as her husband was hospitalized.  Patient did notice some benefit in her walking and balance when she was doing the therapy.  For unclear reason carotid ultrasound which had ordered has not been done yet.  The patient had lab work done at last visit and LDL cholesterol was 67 mg percent and hemoglobin A1c was 6.3.  She has no new complaints today.  She feels her leg strength is improved but her hip problem and arthritis is the main reason for her walking difficulty. ROS:   14 system review of systems is positive for  gait and balance difficulty only gait and balance difficulty only  and all other systems negative PMH:  Past Medical History:  Diagnosis Date  . Arthritis   . Asthma   . DVT (deep venous thrombosis) ([facility]) 2011/2012  . Esophageal dysphagia    limited food and pill  . Esophageal reflux   . Essential hypertension, benign   .  GERD (gastroesophageal reflux disease)   . Osteoporosis   . Overactive bladder   . Paroxysmal atrial fibrillation ([facility])    a. on Xarelto for anticoagulation  . 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 Stroke ([facility])   . Unspecified prolapse of vaginal walls     Social History:  Social History   Socioeconomic History  . Marital status: Married    Spouse name: Not on file  . Number of children: Not on file  . Years of education: Not on file  . Highest education level: Not on file  Occupational  History  . Not on file  Social Needs  . Financial resource strain: Not on file  . Food insecurity:    Worry: Not on file    Inability: Not on file  . Transportation needs:    Medical: Not on file    Non-medical: Not on file  Tobacco Use  . Smoking status: Never Smoker  . Smokeless tobacco: Never Used  Substance and Sexual Activity  . Alcohol use: No    Alcohol/week: 0.0 oz  . Drug use: No  . Sexual activity: Not Currently    Birth control/protection: None  Lifestyle  . Physical activity:    Days per week: Not on file    Minutes per session: Not on file  . Stress: Not on file  Relationships  . Social connections:    Talks on phone: Not on file    Gets together: Not on file    Attends religious service: Not on file    Active member of club or organization: Not on file    Attends meetings of clubs or organizations: Not on file    Relationship status: Not on file  . Intimate partner violence:    Fear of current or ex partner: Not on file    Emotionally abused: Not on file    Physically abused: Not on file    Forced sexual activity: Not on file  Other Topics Concern  . Not on file  Social History Narrative  . Not on file    Medications:   Current Outpatient Medications on File Prior to Visit  Medication Sig Dispense Refill  . acetaminophen (TYLENOL) 500 MG tablet Take 1,000 mg by mouth every 6 (six) hours.     Marland Kitchen diltiazem (CARDIZEM) 60 MG tablet Take 1 tablet (60 mg total) by mouth 3 (three) times daily. 270 tablet 1  . DULoxetine (CYMBALTA) 60 MG capsule Take 60 mg by mouth 2 (two) times daily.     Marland Kitchen esomeprazole (NEXIUM) 20 MG capsule Take 1 capsule (20 mg total) by mouth daily before breakfast. 30 capsule 11  . Fluticasone-Salmeterol (ADVAIR) 250-50 MCG/DOSE AEPB Inhale 1 puff into the lungs 2 (two) times daily.    . metoprolol tartrate (LOPRESSOR) 25 MG tablet Take 1.5 tablets by mouth 2 (two) times daily.    . Rivaroxaban (XARELTO) 15 MG TABS tablet Take 1 tablet  (15 mg total) by mouth daily with supper. 35 tablet 0  . vitamin B-12 (CYANOCOBALAMIN) 1000 MCG tablet Take 1,000 mcg by mouth daily.     No current facility-administered medications on file prior to visit.     Allergies:   Allergies  Allergen Reactions  . Latex Itching and Rash  . Adhesive [Tape] Other (See Comments)    Tears skin  . Betadine [Povidone Iodine]     blisters  . Ciprofloxacin Nausea And Vomiting  . Codeine Nausea And Vomiting    Upsets stomach, nightmares  .  Penicillins Hives    Has patient had a PCN reaction causing immediate rash, facial/tongue/throat swelling, SOB or lightheadedness with hypotension: yes Has patient had a PCN reaction causing severe rash involving mucus membranes or skin necrosis: yes Has patient had a PCN reaction that required hospitalization: no Has patient had a PCN reaction occurring within the last 10 years: no If all of the above answers are "NO", then may proceed with Cephalosporin use.   . Sulfa Antibiotics Hives  . Wellbutrin [Bupropion]     Reaction unknown    Physical Exam General: well developed, well nourished pleasant elderly Caucasian lady, seated, in no evident distress Head: head normocephalic and atraumatic.   Neck: supple with no carotid or supraclavicular bruits Cardiovascular: regular rate and rhythm, no murmurs Musculoskeletal: right knee is swollen and painful Skin:  no rash/petichiae Vascular:  Normal pulses all extremities  Neurologic Exam Mental Status: Awake and fully alert.speech appears clear without aphasia or dysarthria. Oriented to place and time. Recent and remote memory intact.but diminished recall 2/3. Impaired calculation. Attention span, concentration and fund of knowledge appropriate. Mood and affect appropriate. Jaw jerk is brisk. Cranial Nerves: Fundoscopic exam not done today pupils equal, briskly reactive to light. Extraocular movements full without nystagmus. Visual fields full to confrontation.  Hearing intact but slightly diminished bilaterally.. Facial sensation intact. Face, tongue, palate moves normally and symmetrically.  Motor: Normal bulk and tone. Normal strength in all tested extremity muscles but diminished fine finger movements on the left. Orbits right over left upper extremity. Minimum weakness in left grip.Marland Kitchenone is slightly increased in the left leg   Sensory.: intact to touch , pinprick , position and vibratory sensation.except diminished touch pinprick sensation in the left eye anterior aspect  Coordination: Rapid alternating movements normal in all extremities. Finger-to-nose and heel-to-shin performed accurately bilaterally. Gait and Station: Deferred as patient sitting in wheelchair.  And did not bring her walker  reflexes: 2+ and asymmetric and brisker on the left. Toes downgoing.   NIHSS  0 Modified Rankin  3   ASSESSMENT:  84 year old Caucasian lady with chronic gait difficulties likely multifactorial due to combination of hip arthritis and surgery, right knee arthritis and prior strokes and silent cerebrovascular disease.   PLAN: I had a long discussion with the patient and her daughter regarding her remote stroke, atrial fibrillation and her gait difficulty which is likely multifactorial due to combination of hip arthritis, multiple surgeries, right knee arthritis and late effect of previous strokes.I recommend she continue Xarelto for stroke prevention due to atrial fibrillation and maintain strict control of hypertension with blood pressure goal below 130/90 and lipids with LDL cholesterol goal below 70 mg percent. I had a long discussion with the patient and her daughter regarding her multifactorial gait and balance difficulties.  I strongly encouraged her to walk with a walker with a help of a family member several times a day.  We also talked about fall risk prevention . Check carotid ultrasound and ambulatory referral to physical therapy for gait and speech  therapy for swallow evaluation. She was advised to use her walker at all times and be discussed fall and safety prevention precautions. >50% time during this 25 minute  visit was spent on counseling and coordination of care about her gait abnormality, strokes, atrial fibrillation discussion and answering questions.No scheduled follow-up appointment is necessary but she may return for follow-up only as needed ry. Antony Contras, MD  [facility] [address] [place], [place] [address]  Phone [phone] Fax [phone] Note: This document was prepared with digital dictation and possible smart phrase technology. Any transcriptional errors that result from this process are unintentional.

## 2018-03-16 NOTE — Patient Instructions (Addendum)
I had a long discussion with the patient and her daughter regarding her multifactorial gait and balance difficulties.  I strongly encouraged her to walk with a walker with a help of a family member several times a day.  We also talked about fall risk prevention . Fall Prevention in the Home Falls can cause injuries. They can happen to people of all ages. There are many things you can do to make your home safe and to help prevent falls. What can I do on the outside of my home?  Regularly fix the edges of walkways and driveways and fix any cracks.  Remove anything that might make you trip as you walk through a door, such as a raised step or threshold.  Trim any bushes or trees on the path to your home.  Use bright outdoor lighting.  Clear any walking paths of anything that might make someone trip, such as rocks or tools.  Regularly check to see if handrails are loose or broken. Make sure that both sides of any steps have handrails.  Any raised decks and porches should have guardrails on the edges.  Have any leaves, snow, or ice cleared regularly.  Use sand or salt on walking paths during winter.  Clean up any spills in your garage right away. This includes oil or grease spills. What can I do in the bathroom?  Use night lights.  Install grab bars by the toilet and in the tub and shower. Do not use towel bars as grab bars.  Use non-skid mats or decals in the tub or shower.  If you need to sit down in the shower, use a plastic, non-slip stool.  Keep the floor dry. Clean up any water that spills on the floor as soon as it happens.  Remove soap buildup in the tub or shower regularly.  Attach bath mats securely with double-sided non-slip rug tape.  Do not have throw rugs and other things on the floor that can make you trip. What can I do in the bedroom?  Use night lights.  Make sure that you have a light by your bed that is easy to reach.  Do not use any sheets or blankets that  are too big for your bed. They should not hang down onto the floor.  Have a firm chair that has side arms. You can use this for support while you get dressed.  Do not have throw rugs and other things on the floor that can make you trip. What can I do in the kitchen?  Clean up any spills right away.  Avoid walking on wet floors.  Keep items that you use a lot in easy-to-reach places.  If you need to reach something above you, use a strong step stool that has a grab bar.  Keep electrical cords out of the way.  Do not use floor polish or wax that makes floors slippery. If you must use wax, use non-skid floor wax.  Do not have throw rugs and other things on the floor that can make you trip. What can I do with my stairs?  Do not leave any items on the stairs.  Make sure that there are handrails on both sides of the stairs and use them. Fix handrails that are broken or loose. Make sure that handrails are as long as the stairways.  Check any carpeting to make sure that it is firmly attached to the stairs. Fix any carpet that is loose or worn.  Avoid having  throw rugs at the top or bottom of the stairs. If you do have throw rugs, attach them to the floor with carpet tape.  Make sure that you have a light switch at the top of the stairs and the bottom of the stairs. If you do not have them, ask someone to add them for you. What else can I do to help prevent falls?  Wear shoes that: ? Do not have high heels. ? Have rubber bottoms. ? Are comfortable and fit you well. ? Are closed at the toe. Do not wear sandals.  If you use a stepladder: ? Make sure that it is fully opened. Do not climb a closed stepladder. ? Make sure that both sides of the stepladder are locked into place. ? Ask someone to hold it for you, if possible.  Clearly mark and make sure that you can see: ? Any grab bars or handrails. ? First and last steps. ? Where the edge of each step is.  Use tools that help you  move around (mobility aids) if they are needed. These include: ? Canes. ? Walkers. ? Scooters. ? Crutches.  Turn on the lights when you go into a dark area. Replace any light bulbs as soon as they burn out.  Set up your furniture so you have a clear path. Avoid moving your furniture around.  If any of your floors are uneven, fix them.  If there are any pets around you, be aware of where they are.  Review your medicines with your doctor. Some medicines can make you feel dizzy. This can increase your chance of falling. Ask your doctor what other things that you can do to help prevent falls. This information is not intended to replace advice given to you by your health care provider. Make sure you discuss any questions you have with your health care provider. Document Released: 07/27/2009 Document Revised: 03/07/2016 Document Reviewed: 11/04/2014 Elsevier Interactive Patient Education  [employer].

## 2018-03-26 ENCOUNTER — Ambulatory Visit (HOSPITAL_COMMUNITY)
Admission: RE | Admit: 2018-03-26 | Discharge: 2018-03-26 | Disposition: A | Discharge status: Home / Self Care | Payer: Medicare HMO | Source: Ambulatory Visit | Attending: Neurology | Admitting: Neurology

## 2018-03-26 DIAGNOSIS — I6523 Occlusion and stenosis of bilateral carotid arteries: Secondary | ICD-10-CM | POA: Insufficient documentation

## 2018-03-26 DIAGNOSIS — R269 Unspecified abnormalities of gait and mobility: Secondary | ICD-10-CM | POA: Insufficient documentation

## 2018-03-26 NOTE — Progress Notes (Signed)
*  PRELIMINARY RESULTS* Vascular Ultrasound Carotid Duplex (Doppler) has been completed.   Findings suggest 1-39% internal carotid artery stenosis bilaterally. Vertebral arteries are patent with antegrade flow.  03/26/2018 11:34 AM Maudry Mayhew, BS, RVT, RDCS, RDMS

## 2018-03-30 ENCOUNTER — Ambulatory Visit: Payer: Medicare HMO | Admitting: Neurology

## 2018-03-30 ENCOUNTER — Telehealth: Payer: Self-pay

## 2018-03-30 NOTE — Telephone Encounter (Signed)
Notes recorded by Marval Regal, RN on 03/30/2018 at 12:49 PM EDT Rn call patients daughter [name] that carotid ultrasound was unremarkable.No major blockages to worry about. Pts daughter verbalized understanding. ------

## 2018-03-30 NOTE — Telephone Encounter (Signed)
-----   Message from Garvin Fila, MD sent at 03/27/2018  3:50 PM EDT ----- Sara Garcia inform patient carotid ultrasound study was unremarkable. No major blockages to worry about

## 2018-04-03 ENCOUNTER — Telehealth (HOSPITAL_COMMUNITY): Payer: Self-pay

## 2018-04-03 NOTE — Telephone Encounter (Signed)
Talked with Daughter and the patient wants to be d/c.

## 2018-04-08 ENCOUNTER — Encounter (HOSPITAL_COMMUNITY): Payer: Self-pay | Admitting: Physical Therapy

## 2018-04-08 NOTE — Therapy (Signed)
[place], [place], [address] Phone: [phone]   Fax:  [phone]  Patient Details  Name: NOEMIE DEVIVO MRN: [id number] Date of Birth: [birth year]   Encounter Date: 04/08/2018    PHYSICAL THERAPY DISCHARGE SUMMARY  Visits from Start of Care: 5  Current functional level related to goals / functional outcomes: Unknown pt did not return   Remaining deficits: unknown   Education / Equipment: HEP Plan: Patient agrees to discharge.  Patient goals were not met. Patient is being discharged due to not returning since the last visit.  ?????       Rayetta Humphrey, PT CLT [phone] 04/08/2018, 12:04 PM  [facility] [address] [place], [place], [address] Phone: [phone]   Fax:  [phone]

## 2018-04-14 ENCOUNTER — Telehealth: Payer: Self-pay | Admitting: *Deleted

## 2018-04-14 MED ORDER — RIVAROXABAN 15 MG PO TABS: 15.0000 mg | ORAL_TABLET | Freq: Every day | ORAL | 0 refills | Status: DC

## 2018-04-14 NOTE — Telephone Encounter (Signed)
Son came by office - requesting Xarelto 15mg  samples.  Samples provided, along with low income subsidy information given.

## 2018-05-12 ENCOUNTER — Telehealth: Payer: Self-pay | Admitting: Cardiology

## 2018-05-12 MED ORDER — RIVAROXABAN 15 MG PO TABS: 15.0000 mg | ORAL_TABLET | Freq: Every day | ORAL | 0 refills | Status: DC

## 2018-05-12 NOTE — Telephone Encounter (Signed)
Son came by and pick up pt samples

## 2018-05-12 NOTE — Telephone Encounter (Signed)
Patients son called for samples of Xarelto 15 mg

## 2018-06-09 ENCOUNTER — Telehealth: Payer: Self-pay | Admitting: Cardiology

## 2018-06-09 MED ORDER — RIVAROXABAN 15 MG PO TABS: 15.0000 mg | ORAL_TABLET | Freq: Every day | ORAL | 0 refills | Status: DC

## 2018-06-09 NOTE — Telephone Encounter (Signed)
Samples given to son who walked into office - says pt had an episode on Saturday of palpitations lasting about 30 mins or so and HR went to 135 - pt son thinks she may have forgotten to take medications that day (having memory problems) pt son will call if symptoms return - pt already has appt scheduled for 9/23

## 2018-06-09 NOTE — Telephone Encounter (Signed)
Patient walked into the office for samples of medication:   1.  What medication and dosage are you requesting samples for? Xalreto  2.  Are you currently out of this medication?

## 2018-06-17 ENCOUNTER — Other Ambulatory Visit: Payer: Self-pay | Admitting: Cardiology

## 2018-07-06 ENCOUNTER — Ambulatory Visit: Payer: Medicare HMO | Admitting: Cardiology

## 2018-07-06 NOTE — Progress Notes (Deleted)
Clinical Summary Ms. Froh is a 84 y.o.female seen today for follow up of the following medical problems.   1. Parox afib - on beta blocker for rate control, eliquis stroke prophylaxis. - previously we had icreased her lopressor to 50mg  bid from 37.5 mg bid. With change she reported worsening fluttering in her chest and fatigue, we cut dose back to 37.5mg  bid.and she felt better   - recent issues with palpitations - she has not tolerated higher does of lopressor in the past.  - compliant with meds  - last visit we changed dilt to 60mg  tid.   2. Dysphagia - followed by GI  3. HTN - compliant with emds   4. 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 - 01/2018 MRI showed new lacunar infarct. Followed by neuro, recs to continue xarelto   5. Hip fracture - fall 05/2017 leading to fracture - follow up showed she developed a pseduoanerusym in her left groin - s/p pseudoanerusym repair 09/2017. Incision site infection, has been on doxy.    Past Medical History:  Diagnosis Date  . Arthritis   . Asthma   . DVT (deep venous thrombosis) ([place]) 2011/2012  . Esophageal dysphagia    limited food and pill  . Esophageal reflux   . Essential hypertension, benign   . GERD (gastroesophageal reflux disease)   . Osteoporosis   . Overactive bladder   . Paroxysmal atrial fibrillation ([facility])    a. on Xarelto for anticoagulation  . Pseudoaneurysm ([place]) 09/2017  . Stroke ([place])   . Unspecified prolapse of vaginal walls      Allergies  Allergen Reactions  . Latex Itching and Rash  . Adhesive [Tape] Other (See Comments)    Tears skin  . Betadine [Povidone Iodine]     blisters  . Ciprofloxacin Nausea And Vomiting  . Codeine Nausea And Vomiting    Upsets stomach, nightmares  . Penicillins Hives    Has patient had a PCN reaction causing immediate rash, facial/tongue/throat swelling, SOB or  lightheadedness with hypotension: yes Has patient had a PCN reaction causing severe rash involving mucus membranes or skin necrosis: yes Has patient had a PCN reaction that required hospitalization: no Has patient had a PCN reaction occurring within the last 10 years: no If all of the above answers are "NO", then may proceed with Cephalosporin use.   . Sulfa Antibiotics Hives  . Wellbutrin [Bupropion]     Reaction unknown     Current Outpatient Medications  Medication Sig Dispense Refill  . acetaminophen (TYLENOL) 500 MG tablet Take 1,000 mg by mouth every 6 (six) hours.     Marland Kitchen diltiazem (CARDIZEM) 60 MG tablet Take 1 tablet (60 mg total) by mouth 3 (three) times daily. 270 tablet 1  . diltiazem (CARDIZEM) 60 MG tablet Take 1 tablet (60 mg total) by mouth 3 (three) times daily. Dose was increased on 02/02/18 270 tablet 0  . DULoxetine (CYMBALTA) 60 MG capsule Take 60 mg by mouth 2 (two) times daily.     Marland Kitchen esomeprazole (NEXIUM) 20 MG capsule Take 1 capsule (20 mg total) by mouth daily before breakfast. 30 capsule 11  . Fluticasone-Salmeterol (ADVAIR) 250-50 MCG/DOSE AEPB Inhale 1 puff into the lungs 2 (two) times daily.    . metoprolol tartrate (LOPRESSOR) 25 MG tablet Take 1.5 tablets by mouth 2 (two) times daily.    . Rivaroxaban (XARELTO) 15 MG  TABS tablet Take 1 tablet (15 mg total) by mouth daily with supper. 28 tablet 0  . vitamin B-12 (CYANOCOBALAMIN) 1000 MCG tablet Take 1,000 mcg by mouth daily.     No current facility-administered medications for this visit.      Past Surgical History:  Procedure Laterality Date  . ABDOMINAL HYSTERECTOMY    . COLONOSCOPY W/ BIOPSIES  02/01/2004   RMR: Anal papilla with normal rectum/Sigmoid diverticula/Small polyps in the cecum removed as described above. Inflammatory polyp  . ESOPHAGEAL DILATION     unknown year per patient.  . ESOPHAGEAL DILATION N/A 02/07/2015   RMR: Normal esophagus status post maloney dilation. Small hiatal hernia.     Marland Kitchen ESOPHAGOGASTRODUODENOSCOPY     unknown year per patient  . ESOPHAGOGASTRODUODENOSCOPY N/A 02/07/2015   RMR: Normal esophagus  status post  Maloney dilation. Small hiatal hernia  . FALSE ANEURYSM REPAIR Left 09/23/2017   Procedure: REPAIR FALSE ANEURYSM COMMON LEFT FEMORAL ARTERY WITH OSTEOTOMY OF BONE FRAGEMENT;  Surgeon: Angelia Mould, MD;  Location: [facility];  Service: Vascular;  Laterality: Left;  . FEMUR IM NAIL Left 06/11/2017   Procedure: INTRAMEDULLARY (IM) NAIL FEMORAL;  Surgeon: Rod Can, MD;  Location: [facility];  Service: Orthopedics;  Laterality: Left;  . NASAL SEPTUM SURGERY    . TONSILLECTOMY    . WRIST FRACTURE SURGERY       Allergies  Allergen Reactions  . Latex Itching and Rash  . Adhesive [Tape] Other (See Comments)    Tears skin  . Betadine [Povidone Iodine]     blisters  . Ciprofloxacin Nausea And Vomiting  . Codeine Nausea And Vomiting    Upsets stomach, nightmares  . Penicillins Hives    Has patient had a PCN reaction causing immediate rash, facial/tongue/throat swelling, SOB or lightheadedness with hypotension: yes Has patient had a PCN reaction causing severe rash involving mucus membranes or skin necrosis: yes Has patient had a PCN reaction that required hospitalization: no Has patient had a PCN reaction occurring within the last 10 years: no If all of the above answers are "NO", then may proceed with Cephalosporin use.   . Sulfa Antibiotics Hives  . Wellbutrin [Bupropion]     Reaction unknown      Family History  Problem Relation Age of Onset  . Heart disease Mother   . Stroke Mother   . Emphysema Father   . Colon polyps Neg Hx   . Colon cancer Neg Hx      Social History Ms. Notz reports that she has never smoked. She has never used smokeless tobacco. Ms. Iezzi reports that she does not drink alcohol.   Review of Systems CONSTITUTIONAL: No weight loss, fever, chills, weakness or fatigue.  HEENT: Eyes: No visual loss, blurred  vision, double vision or yellow sclerae.No hearing loss, sneezing, congestion, runny nose or sore throat.  SKIN: No rash or itching.  CARDIOVASCULAR:  RESPIRATORY: No shortness of breath, cough or sputum.  GASTROINTESTINAL: No anorexia, nausea, vomiting or diarrhea. No abdominal pain or blood.  GENITOURINARY: No burning on urination, no polyuria NEUROLOGICAL: No headache, dizziness, syncope, paralysis, ataxia, numbness or tingling in the extremities. No change in bowel or bladder control.  MUSCULOSKELETAL: No muscle, back pain, joint pain or stiffness.  LYMPHATICS: No enlarged nodes. No history of splenectomy.  PSYCHIATRIC: No history of depression or anxiety.  ENDOCRINOLOGIC: No reports of sweating, cold or heat intolerance. No polyuria or polydipsia.  Marland Kitchen   Physical Examination There were no vitals filed  for this visit. There were no vitals filed for this visit.  Gen: resting comfortably, no acute distress HEENT: no scleral icterus, pupils equal round and reactive, no palptable cervical adenopathy,  CV Resp: Clear to auscultation bilaterally GI: abdomen is soft, non-tender, non-distended, normal bowel sounds, no hepatosplenomegaly MSK: extremities are warm, no edema.  Skin: warm, no rash Neuro:  no focal deficits Psych: appropriate affect   Diagnostic Studies  12/2013 Lexicscan MPI Impression Exercise Capacity: Lexiscan with no exercise.  BP Response: Normal blood pressure response.  Clinical Symptoms: Nausea  ECG Impression: No significant ST segment change suggestive of ischemia.  Comparison with Prior Nuclear Study: No previous nuclear study performed  Overall Impression: Normal stress nuclear study.  LV Ejection Fraction: 85%. LV Wall Motion: NL LV Function; NL Wall Motion    Jan 2015 echo Study Conclusions  - Study data: Technically difficult study. - Left ventricle: The cavity size was normal. Wall thickness was increased in a pattern of mild LVH.  There is mild basal septal hypertrophy without obstructive gradient. Systolic function was normal. The estimated ejection fraction was in the range of 60% to 65%. Doppler parameters are consistent with abnormal left ventricular relaxation (grade 1 diastolic dysfunction). - Aortic valve: Mildly calcified annulus. Trileaflet; mildly thickened leaflets. Trivial regurgitation. Valve area: 1.69cm^2(VTI). Valve area: 1.79cm^2 (Vmax). - Mitral valve: Mildly calcified annulus. Mildly thickened leaflets .   01/2015 Holter monitor No significant arrhythmias   07/2017 echo Study Conclusions  - Left ventricle: The cavity size was normal. There was mild focal basal hypertrophy of the septum. Systolic function was normal. The estimated ejection fraction was in the range of 55% to 60%. Doppler parameters are consistent with both elevated ventricular end-diastolic filling pressure and elevated left atrial filling pressure. - Mitral valve: Calcified annulus. Mildly thickened leaflets . - Right ventricle: The cavity size was mildly dilated. - Atrial septum: No defect or patent foramen ovale was identified.      Assessment and Plan  1. Parox afib -ongoing palpitations. She feels like in between dilt doses the medicien starts to wear off and that's when her symtpoms happen. Has not tolerated higher doses of beta blockers - we will try changing her dilt to 60mg  tid, hold lopressor for now. Other option would be to consider long acting dilt, however she is concerned about increased cost.  - continue xarelto for stroke prevention  2. HTN -mildly elevated, she is increasing diltiazem.       Arnoldo Lenis, M.D.

## 2018-07-07 ENCOUNTER — Encounter: Payer: Self-pay | Admitting: Cardiology

## 2018-07-07 ENCOUNTER — Ambulatory Visit: Payer: Medicare HMO | Admitting: Cardiology

## 2018-07-07 DIAGNOSIS — I10 Essential (primary) hypertension: Secondary | ICD-10-CM

## 2018-07-07 DIAGNOSIS — I480 Paroxysmal atrial fibrillation: Secondary | ICD-10-CM | POA: Diagnosis not present

## 2018-07-07 MED ORDER — RIVAROXABAN 15 MG PO TABS: 15.0000 mg | ORAL_TABLET | Freq: Every day | ORAL | 0 refills | Status: DC

## 2018-07-07 NOTE — Progress Notes (Signed)
Clinical Summary Sara Garcia is a 84 y.o.female seen today for follow up of the following medical problems.   1. Parox afib - on beta blocker for rate control, eliquis stroke prophylaxis. - previously we had icreased her lopressor to 50mg  bid from 37.5 mg bid. With change she reported worsening fluttering in her chest and fatigue, we cut dose back to 37.5mg  bid.and she felt better  - she has not tolerated higher does of lopressor in the past.   - last visit we changed dilt to 60mg  tid, she reported some palpitations that seem to occur in between doses. There was concern about cost of long acting dilt - deneis any significant palpitations.   2. Dysphagia - followed by GI  3. HTN - compliant with emds   4. 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 - 01/2018 MRI showed new lacunar infarct. Followed by neuro, recs to continue xarelto   5. Hip fracture - fall 05/2017 leading to fracture - follow up showed she developed a pseduoanerusym in her left groin - s/p pseudoanerusym repair 09/2017. Incision site infection, has been on doxy.    Past Medical History:  Diagnosis Date  . Arthritis   . Asthma   . DVT (deep venous thrombosis) ([place] [facility]) 2011/2012  . Esophageal dysphagia    limited food and pill  . Esophageal reflux   . Essential hypertension, benign   . GERD (gastroesophageal reflux disease)   . Osteoporosis   . Overactive bladder   . Paroxysmal atrial fibrillation ([facility])    a. on Xarelto for anticoagulation  . Pseudoaneurysm ([place]) 09/2017  . Stroke ([place])   . Unspecified prolapse of vaginal walls      Allergies  Allergen Reactions  . Latex Itching and Rash  . Adhesive [Tape] Other (See Comments)    Tears skin  . Betadine [Povidone Iodine]     blisters  . Ciprofloxacin Nausea And Vomiting  . Codeine Nausea And Vomiting    Upsets stomach, nightmares  . Penicillins Hives   Has patient had a PCN reaction causing immediate rash, facial/tongue/throat swelling, SOB or lightheadedness with hypotension: yes Has patient had a PCN reaction causing severe rash involving mucus membranes or skin necrosis: yes Has patient had a PCN reaction that required hospitalization: no Has patient had a PCN reaction occurring within the last 10 years: no If all of the above answers are "NO", then may proceed with Cephalosporin use.   . Sulfa Antibiotics Hives  . Wellbutrin [Bupropion]     Reaction unknown     Current Outpatient Medications  Medication Sig Dispense Refill  . acetaminophen (TYLENOL) 500 MG tablet Take 1,000 mg by mouth every 6 (six) hours.     Marland Kitchen diltiazem (CARDIZEM) 60 MG tablet Take 1 tablet (60 mg total) by mouth 3 (three) times daily. 270 tablet 1  . diltiazem (CARDIZEM) 60 MG tablet Take 1 tablet (60 mg total) by mouth 3 (three) times daily. Dose was increased on 02/02/18 270 tablet 0  . DULoxetine (CYMBALTA) 60 MG capsule Take 60 mg by mouth 2 (two) times daily.     Marland Kitchen esomeprazole (NEXIUM) 20 MG capsule Take 1 capsule (20 mg total) by mouth daily before breakfast. 30 capsule 11  . Fluticasone-Salmeterol (ADVAIR) 250-50 MCG/DOSE AEPB Inhale 1 puff into the lungs 2 (two) times daily.    . metoprolol tartrate (LOPRESSOR) 25 MG tablet Take 1.5 tablets by  mouth 2 (two) times daily.    . Rivaroxaban (XARELTO) 15 MG TABS tablet Take 1 tablet (15 mg total) by mouth daily with supper. 28 tablet 0  . vitamin B-12 (CYANOCOBALAMIN) 1000 MCG tablet Take 1,000 mcg by mouth daily.     No current facility-administered medications for this visit.      Past Surgical History:  Procedure Laterality Date  . ABDOMINAL HYSTERECTOMY    . COLONOSCOPY W/ BIOPSIES  02/01/2004   RMR: Anal papilla with normal rectum/Sigmoid diverticula/Small polyps in the cecum removed as described above. Inflammatory polyp  . ESOPHAGEAL DILATION     unknown year per patient.  . ESOPHAGEAL DILATION  N/A 02/07/2015   RMR: Normal esophagus status post maloney dilation. Small hiatal hernia.   Marland Kitchen ESOPHAGOGASTRODUODENOSCOPY     unknown year per patient  . ESOPHAGOGASTRODUODENOSCOPY N/A 02/07/2015   RMR: Normal esophagus  status post  Maloney dilation. Small hiatal hernia  . FALSE ANEURYSM REPAIR Left 09/23/2017   Procedure: REPAIR FALSE ANEURYSM COMMON LEFT FEMORAL ARTERY WITH OSTEOTOMY OF BONE FRAGEMENT;  Surgeon: Angelia Mould, MD;  Location: [facility];  Service: Vascular;  Laterality: Left;  . FEMUR IM NAIL Left 06/11/2017   Procedure: INTRAMEDULLARY (IM) NAIL FEMORAL;  Surgeon: Rod Can, MD;  Location: [facility];  Service: Orthopedics;  Laterality: Left;  . NASAL SEPTUM SURGERY    . TONSILLECTOMY    . WRIST FRACTURE SURGERY       Allergies  Allergen Reactions  . Latex Itching and Rash  . Adhesive [Tape] Other (See Comments)    Tears skin  . Betadine [Povidone Iodine]     blisters  . Ciprofloxacin Nausea And Vomiting  . Codeine Nausea And Vomiting    Upsets stomach, nightmares  . Penicillins Hives    Has patient had a PCN reaction causing immediate rash, facial/tongue/throat swelling, SOB or lightheadedness with hypotension: yes Has patient had a PCN reaction causing severe rash involving mucus membranes or skin necrosis: yes Has patient had a PCN reaction that required hospitalization: no Has patient had a PCN reaction occurring within the last 10 years: no If all of the above answers are "NO", then may proceed with Cephalosporin use.   . Sulfa Antibiotics Hives  . Wellbutrin [Bupropion]     Reaction unknown      Family History  Problem Relation Age of Onset  . Heart disease Mother   . Stroke Mother   . Emphysema Father   . Colon polyps Neg Hx   . Colon cancer Neg Hx      Social History Sara Garcia reports that she has never smoked. She has never used smokeless tobacco. Sara Garcia reports that she does not drink alcohol.   Review of Systems CONSTITUTIONAL: No  weight loss, fever, chills, weakness or fatigue.  HEENT: Eyes: No visual loss, blurred vision, double vision or yellow sclerae.No hearing loss, sneezing, congestion, runny nose or sore throat.  SKIN: No rash or itching.  CARDIOVASCULAR: per hpi RESPIRATORY: No shortness of breath, cough or sputum.  GASTROINTESTINAL: No anorexia, nausea, vomiting or diarrhea. No abdominal pain or blood.  GENITOURINARY: No burning on urination, no polyuria NEUROLOGICAL: leg weakness MUSCULOSKELETAL: No muscle, back pain, joint pain or stiffness.  LYMPHATICS: No enlarged nodes. No history of splenectomy.  PSYCHIATRIC: No history of depression or anxiety.  ENDOCRINOLOGIC: No reports of sweating, cold or heat intolerance. No polyuria or polydipsia.  Marland Kitchen   Physical Examination Vitals:   07/07/18 1354  BP: 117/66  Pulse:  70  SpO2: 95%   Vitals:   07/07/18 1354  Weight: 151 lb 12.8 oz (68.9 kg)  Height: 5' (1.524 m)    Gen: resting comfortably, no acute distress HEENT: no scleral icterus, pupils equal round and reactive, no palptable cervical adenopathy,  CV: RRR, 2/6 systolic murmur apex Resp: Clear to auscultation bilaterally GI: abdomen is soft, non-tender, non-distended, normal bowel sounds, no hepatosplenomegaly MSK: extremities are warm, no edema.  Skin: warm, no rash Neuro:  no focal deficits Psych: appropriate affect   Diagnostic Studies 12/2013 Lexicscan MPI Impression Exercise Capacity: Lexiscan with no exercise.  BP Response: Normal blood pressure response.  Clinical Symptoms: Nausea  ECG Impression: No significant ST segment change suggestive of ischemia.  Comparison with Prior Nuclear Study: No previous nuclear study performed  Overall Impression: Normal stress nuclear study.  LV Ejection Fraction: 85%. LV Wall Motion: NL LV Function; NL Wall Motion    Jan 2015 echo Study Conclusions  - Study data: Technically difficult study. - Left ventricle: The cavity size was  normal. Wall thickness was increased in a pattern of mild LVH. There is mild basal septal hypertrophy without obstructive gradient. Systolic function was normal. The estimated ejection fraction was in the range of 60% to 65%. Doppler parameters are consistent with abnormal left ventricular relaxation (grade 1 diastolic dysfunction). - Aortic valve: Mildly calcified annulus. Trileaflet; mildly thickened leaflets. Trivial regurgitation. Valve area: 1.69cm^2(VTI). Valve area: 1.79cm^2 (Vmax). - Mitral valve: Mildly calcified annulus. Mildly thickened leaflets .   01/2015 Holter monitor No significant arrhythmias   07/2017 echo Study Conclusions  - Left ventricle: The cavity size was normal. There was mild focal basal hypertrophy of the septum. Systolic function was normal. The estimated ejection fraction was in the range of 55% to 60%. Doppler parameters are consistent with both elevated ventricular end-diastolic filling pressure and elevated left atrial filling pressure. - Mitral valve: Calcified annulus. Mildly thickened leaflets . - Right ventricle: The cavity size was mildly dilated. - Atrial septum: No defect or patent foramen ovale was identified.       Assessment and Plan  1. Parox afib -symptoms doing well since last visit, continue current therapy - continue xarelto for stroke prevention  2. HTN - at goal, continue current meds   F/u 6 months       Arnoldo Lenis, M.D.

## 2018-07-07 NOTE — Patient Instructions (Signed)

## 2018-07-07 NOTE — Addendum Note (Signed)
Addended by: Julian Hy T on: 07/07/2018 02:49 PM   Modules accepted: Orders

## 2018-07-14 ENCOUNTER — Encounter: Payer: Self-pay | Admitting: Internal Medicine

## 2018-07-14 ENCOUNTER — Other Ambulatory Visit: Payer: Self-pay

## 2018-07-14 ENCOUNTER — Ambulatory Visit (INDEPENDENT_AMBULATORY_CARE_PROVIDER_SITE_OTHER): Payer: Medicare HMO | Admitting: Internal Medicine

## 2018-07-14 VITALS — BP 111/61 | HR 62 | Temp 97.1°F | Ht 61.0 in | Wt 149.8 lb

## 2018-07-14 DIAGNOSIS — R1319 Other dysphagia: Secondary | ICD-10-CM | POA: Diagnosis not present

## 2018-07-14 DIAGNOSIS — R1310 Dysphagia, unspecified: Secondary | ICD-10-CM

## 2018-07-14 NOTE — Progress Notes (Signed)
Primary Care Physician:  Doree Albee, MD Primary Gastroenterologist:  Dr. Gala Romney  Pre-Procedure History & Physical: HPI:  Sara Garcia is a 84 y.o. female here for further evaluation of difficulties chewing/swallowing.  History of multiple CVAs recently.  Son accompanies at today's  visit.  Has difficulties without any reproducibility.  May have trouble with the same food and may not the next day.  Eats applesauce before and after any meal as she feels this facilitate swallowing.  Seen by speech therapy back in May of this year.  Felt to have some oral phase dysfunction and recommended ongoing therapy.  However, I only see the initial visit in the medical record.  Does not seem to have any esophageal dysphagia.  She is on Nexium empirically for GERD and seems to do well with this regimen.  She does not have any nausea or vomiting.  She has had esophageal dysphagia in years past and has responded to empiric dilation.  Past Medical History:  Diagnosis Date  . Arthritis   . Asthma   . DVT (deep venous thrombosis) ([place]) 2011/2012  . Esophageal dysphagia    limited food and pill  . Esophageal reflux   . Essential hypertension, benign   . GERD (gastroesophageal reflux disease)   . Osteoporosis   . Overactive bladder   . Paroxysmal atrial fibrillation ([facility])    a. on Xarelto for anticoagulation  . Pseudoaneurysm ([place]) 09/2017  . Stroke ([place])   . Unspecified prolapse of vaginal walls     Past Surgical History:  Procedure Laterality Date  . ABDOMINAL HYSTERECTOMY    . COLONOSCOPY W/ BIOPSIES  02/01/2004   RMR: Anal papilla with normal rectum/Sigmoid diverticula/Small polyps in the cecum removed as described above. Inflammatory polyp  . ESOPHAGEAL DILATION     unknown year per patient.  . ESOPHAGEAL DILATION N/A 02/07/2015   RMR: Normal esophagus status post maloney dilation. Small hiatal hernia.   Marland Kitchen ESOPHAGOGASTRODUODENOSCOPY     unknown year per patient  .  ESOPHAGOGASTRODUODENOSCOPY N/A 02/07/2015   RMR: Normal esophagus  status post  Maloney dilation. Small hiatal hernia  . FALSE ANEURYSM REPAIR Left 09/23/2017   Procedure: REPAIR FALSE ANEURYSM COMMON LEFT FEMORAL ARTERY WITH OSTEOTOMY OF BONE FRAGEMENT;  Surgeon: Angelia Mould, MD;  Location: [facility];  Service: Vascular;  Laterality: Left;  . FEMUR IM NAIL Left 06/11/2017   Procedure: INTRAMEDULLARY (IM) NAIL FEMORAL;  Surgeon: Rod Can, MD;  Location: [facility];  Service: Orthopedics;  Laterality: Left;  . NASAL SEPTUM SURGERY    . TONSILLECTOMY    . WRIST FRACTURE SURGERY      Prior to Admission medications   Medication Sig Start Date End Date Taking? Authorizing Provider  acetaminophen (TYLENOL) 650 MG CR tablet Take 650 mg by mouth every 8 (eight) hours as needed for pain.   Yes [provider]  cholecalciferol (VITAMIN D) 1000 units tablet Take 1,000 Units by mouth 3 (three) times daily.   Yes [provider]  diltiazem (CARDIZEM) 60 MG tablet Take 1 tablet (60 mg total) by mouth 3 (three) times daily. Dose was increased on 02/02/18 06/18/18  Yes Branch, Alphonse Guild, MD  DULoxetine (CYMBALTA) 60 MG capsule Take 60 mg by mouth 2 (two) times daily.    Yes [provider]  esomeprazole (NEXIUM) 20 MG capsule Take 1 capsule (20 mg total) by mouth daily before breakfast. 02/28/18  Yes Mahala Menghini, PA-C  Fluticasone-Salmeterol [facility] INHUB) 250-50 MCG/DOSE AEPB  Inhale 1 puff into the lungs 2 (two) times daily.   Yes [provider]  metoprolol tartrate (LOPRESSOR) 25 MG tablet Take 1.5 tablets by mouth 2 (two) times daily.   Yes [provider]  Rivaroxaban (XARELTO) 15 MG TABS tablet Take 1 tablet (15 mg total) by mouth daily with supper. 07/07/18  Yes BranchAlphonse Guild, MD  THYROID PO Take 1 tablet by mouth daily.   Yes [provider]  vitamin B-12 (CYANOCOBALAMIN) 1000 MCG tablet Take 1,000 mcg by mouth daily.   Yes [provider]  cyclobenzaprine (FLEXERIL) 10 MG tablet Take 10 mg by mouth 3 (three) times daily as needed for muscle spasms.    [provider]    Allergies as of 07/14/2018 - Review Complete 07/14/2018  Allergen Reaction Noted  . Latex Itching and Rash 10/20/2013  . Adhesive [tape] Other (See Comments) 10/20/2013  . Betadine [povidone iodine]  06/11/2017  . Ciprofloxacin Nausea And Vomiting 10/20/2013  . Codeine Nausea And Vomiting 10/20/2013  . Penicillins Hives 10/20/2013  . Sulfa antibiotics Hives 10/20/2013  . Wellbutrin [bupropion]  07/21/2017    Family History  Problem Relation Age of Onset  . Heart disease Mother   . Stroke Mother   . Emphysema Father   . Colon polyps Neg Hx   . Colon cancer Neg Hx     Social History   Socioeconomic History  . Marital status: Married    Spouse name: Not on file  . Number of children: Not on file  . Years of education: Not on file  . Highest education level: Not on file  Occupational History  . Not on file  Social Needs  . Financial resource strain: Not on file  . Food insecurity:    Worry: Not on file    Inability: Not on file  . Transportation needs:    Medical: Not on file    Non-medical: Not on file  Tobacco Use  . Smoking status: Never Smoker  . Smokeless tobacco: Never Used  Substance and Sexual Activity  . Alcohol use: No    Alcohol/week: 0.0 standard drinks  . Drug use: No  . Sexual activity: Not Currently    Birth control/protection: None  Lifestyle  . Physical activity:    Days per week: Not on file    Minutes per session: Not on file  . Stress: Not on file  Relationships  . Social connections:    Talks on phone: Not on file    Gets together: Not on file    Attends religious service: Not on file    Active member of club or organization: Not on file    Attends meetings of clubs or organizations: Not on file    Relationship status: Not on file  . Intimate partner violence:    Fear of current  or ex partner: Not on file    Emotionally abused: Not on file    Physically abused: Not on file    Forced sexual activity: Not on file  Other Topics Concern  . Not on file  Social History Narrative  . Not on file    Review of Systems: See HPI, otherwise negative ROS  Physical Exam: BP 111/61   Pulse 62   Temp (!) 97.1 F (36.2 C) (Oral)   Ht 5\' 1"  (1.549 m)   Wt 149 lb 12.8 oz (67.9 kg)   BMI 28.30 kg/m  General:   Alert,  , pleasant and cooperative in NAD  in wheelchair.  Accompanied by son. Neck:  Supple; no masses or thyromegaly. No significant cervical adenopathy. Lungs:  Clear throughout to auscultation.   No wheezes, crackles, or rhonchi. No acute distress. Heart:  Regular rate and rhythm; no murmurs, clicks, rubs,  or gallops. Abdomen: Non-distended, normal bowel sounds.  Soft and nontender without appreciable mass or hepatosplenomegaly.  Pulses:  Normal pulses noted. Extremities:  Without clubbing or edema.  Impression/Plan:    Pleasant 84 year old lady with primarily oropharyngeal dysphagia symptoms.  Not mentioned above, she does have false teeth.  Recent CVA.  Some oral dysfunction noted with a speech therapy evaluation earlier in the year.  She will likely benefit from further therapy.   Recommendations:  Barium pill esophogram to evaluate dysphagia  Continue Nexium daily  You may be going back to the speech therapist for ongoing therapy  Further recommendations to follow    Notice: This dictation was prepared with Dragon dictation along with smaller phrase technology. Any transcriptional errors that result from this process are unintentional and may not be corrected upon review.

## 2018-07-14 NOTE — Patient Instructions (Signed)
Barium pill esophogram to evaluate dysphagia  Continue Nexium daily  You may be going back to the speech therapist for ongoing therapy  Further recommendations to follow

## 2018-07-22 ENCOUNTER — Ambulatory Visit (HOSPITAL_COMMUNITY)
Admission: RE | Admit: 2018-07-22 | Discharge: 2018-07-22 | Disposition: A | Discharge status: Home / Self Care | Payer: Medicare HMO | Source: Ambulatory Visit | Attending: Internal Medicine | Admitting: Internal Medicine

## 2018-07-22 DIAGNOSIS — K224 Dyskinesia of esophagus: Secondary | ICD-10-CM | POA: Diagnosis not present

## 2018-07-22 DIAGNOSIS — K449 Diaphragmatic hernia without obstruction or gangrene: Secondary | ICD-10-CM | POA: Diagnosis not present

## 2018-07-22 DIAGNOSIS — R1310 Dysphagia, unspecified: Secondary | ICD-10-CM | POA: Diagnosis present

## 2018-08-04 ENCOUNTER — Telehealth: Payer: Self-pay | Admitting: Cardiology

## 2018-08-04 MED ORDER — RIVAROXABAN 15 MG PO TABS: 15.0000 mg | ORAL_TABLET | Freq: Every day | ORAL | 0 refills | Status: DC

## 2018-08-04 NOTE — Telephone Encounter (Signed)
Patient's son is here asking for samples for his mom  Rivaroxaban (XARELTO) 15 MG TABS tablet

## 2018-08-04 NOTE — Telephone Encounter (Signed)
xarelto samples given

## 2018-08-12 ENCOUNTER — Other Ambulatory Visit: Payer: Self-pay | Admitting: Cardiology

## 2018-08-17 ENCOUNTER — Telehealth: Payer: Self-pay | Admitting: Internal Medicine

## 2018-08-17 NOTE — Telephone Encounter (Signed)
Spoke with pt. Results were given to her daughter on 07/24/18. Pt will discuss with her daughter and if she needs to call back, she will.

## 2018-08-17 NOTE — Telephone Encounter (Signed)
Pt is calling to see if her result from 10/9 are available. [phone]

## 2018-09-14 ENCOUNTER — Telehealth: Payer: Self-pay | Admitting: *Deleted

## 2018-09-14 MED ORDER — RIVAROXABAN 15 MG PO TABS: 15.0000 mg | ORAL_TABLET | Freq: Every day | ORAL | 0 refills | Status: DC

## 2018-09-14 NOTE — Telephone Encounter (Signed)
Informed that xarelto samples are available for pick up

## 2018-10-05 ENCOUNTER — Telehealth: Payer: Self-pay | Admitting: Cardiology

## 2018-10-05 ENCOUNTER — Other Ambulatory Visit: Payer: Self-pay | Admitting: Cardiology

## 2018-10-05 MED ORDER — RIVAROXABAN 15 MG PO TABS: 15.0000 mg | ORAL_TABLET | Freq: Every day | ORAL | 0 refills | Status: DC

## 2018-10-05 NOTE — Telephone Encounter (Signed)
Patient calling the office for samples of medication:   1.  What medication and dosage are you requesting samples for?   xarelto  2.  Are you currently out of this medication?   Please call Audry Pili [phone]

## 2018-10-05 NOTE — Telephone Encounter (Signed)
Ricky notified samples are ready for pick up.

## 2018-11-04 ENCOUNTER — Telehealth: Payer: Self-pay | Admitting: Cardiology

## 2018-11-04 MED ORDER — RIVAROXABAN 15 MG PO TABS: 15.0000 mg | ORAL_TABLET | Freq: Every day | ORAL | 0 refills | Status: DC

## 2018-11-04 NOTE — Telephone Encounter (Signed)
°  Patient calling the office for samples of medication:   1.  What medication and dosage are you requesting samples for? Xalreto   2.  Are you currently out of this medication? Has about a week left

## 2018-11-04 NOTE — Telephone Encounter (Signed)
Aware that samples were available for pt to pickup

## 2018-11-10 ENCOUNTER — Encounter: Payer: Self-pay | Admitting: Internal Medicine

## 2018-11-10 ENCOUNTER — Ambulatory Visit: Payer: Medicare HMO | Admitting: Internal Medicine

## 2018-11-10 VITALS — BP 121/63 | HR 72 | Temp 96.6°F | Ht 60.0 in | Wt 153.4 lb

## 2018-11-10 DIAGNOSIS — R1311 Dysphagia, oral phase: Secondary | ICD-10-CM

## 2018-11-10 DIAGNOSIS — R682 Dry mouth, unspecified: Secondary | ICD-10-CM | POA: Diagnosis not present

## 2018-11-10 DIAGNOSIS — D508 Other iron deficiency anemias: Secondary | ICD-10-CM

## 2018-11-10 DIAGNOSIS — K117 Disturbances of salivary secretion: Secondary | ICD-10-CM

## 2018-11-10 NOTE — Patient Instructions (Addendum)
Continue Nexium 20 mg daily  Continue Biotin ; stay hydrated  Avoid rapid changes in head position; keep head as neutral as possible while awake  IFOBT test today  We will collect labs from Dr. Lanice Shirts office  Further recommendations to follow

## 2018-11-10 NOTE — Progress Notes (Signed)
Primary Care Physician:  Doree Albee, MD Primary Gastroenterologist:  Dr. Gala Romney  Pre-Procedure History & Physical: HPI:  Sara Garcia is a 85 y.o. female here for follow-up of oropharyngeal dysphagia..  Patient feels that she has difficulty choking on her saliva when she bends her head forward rapidly.  When she is sitting upright in with her head in a neutral position she does well is able to consume her meals and medications. She does have a chronic anemia.  She has been on iron supplementation.  Reported history of iron deficiency recently through Dr. Lanice Shirts office.  However, I do not have any records.  No melena or rectal bleeding.  Stools are chronically dark.  Takes Xarelto for history of DVT atrial fibrillation. Reflux symptoms well controlled on Nexium 20 mg daily. Multiple evaluations by speech pathology.  Nothing significant found other than a dry mouth for which she takes biotin. Not much difference with empiric dilation of her esophagus for obscure dysphagia symptoms.  She denies abdominal pain.   Appetite maintained otherwise.   Past Medical History:  Diagnosis Date  . Arthritis   . Asthma   . DVT (deep venous thrombosis) ([place]) 2011/2012  . Esophageal dysphagia    limited food and pill  . Esophageal reflux   . Essential hypertension, benign   . GERD (gastroesophageal reflux disease)   . Osteoporosis   . Overactive bladder   . Paroxysmal atrial fibrillation (H[facility])    a. on Xarelto for anticoagulation  . Pseudoaneurysm ([place]) 09/2017  . Stroke ([place])   . Unspecified prolapse of vaginal walls     Past Surgical History:  Procedure Laterality Date  . ABDOMINAL HYSTERECTOMY    . COLONOSCOPY W/ BIOPSIES  02/01/2004   RMR: Anal papilla with normal rectum/Sigmoid diverticula/Small polyps in the cecum removed as described above. Inflammatory polyp  . ESOPHAGEAL DILATION     unknown year per patient.  . ESOPHAGEAL DILATION N/A 02/07/2015   RMR: Normal esophagus status  post maloney dilation. Small hiatal hernia.   Marland Kitchen ESOPHAGOGASTRODUODENOSCOPY     unknown year per patient  . ESOPHAGOGASTRODUODENOSCOPY N/A 02/07/2015   RMR: Normal esophagus  status post  Maloney dilation. Small hiatal hernia  . FALSE ANEURYSM REPAIR Left 09/23/2017   Procedure: REPAIR FALSE ANEURYSM COMMON LEFT FEMORAL ARTERY WITH OSTEOTOMY OF BONE FRAGEMENT;  Surgeon: Angelia Mould, MD;  Location: [facility];  Service: Vascular;  Laterality: Left;  . FEMUR IM NAIL Left 06/11/2017   Procedure: INTRAMEDULLARY (IM) NAIL FEMORAL;  Surgeon: Rod Can, MD;  Location[facility];  Service: Orthopedics;  Laterality: Left;  . NASAL SEPTUM SURGERY    . TONSILLECTOMY    . WRIST FRACTURE SURGERY      Prior to Admission medications   Medication Sig Start Date End Date Taking? Authorizing Provider  acetaminophen (TYLENOL) 650 MG CR tablet Take 650 mg by mouth every 8 (eight) hours as needed for pain.   Yes [provider]  cholecalciferol (VITAMIN D) 1000 units tablet Take 1,000 Units by mouth 3 (three) times daily.   Yes [provider]  diltiazem (CARDIZEM) 60 MG tablet TAKE 1 TABLET BY MOUTH 3 TIMES A DAY 08/13/18  Yes Branch, Alphonse Guild, MD  DULoxetine (CYMBALTA) 60 MG capsule Take 60 mg by mouth 2 (two) times daily.    Yes [provider]  esomeprazole (NEXIUM) 20 MG capsule Take 1 capsule (20 mg total) by mouth daily before breakfast. 02/28/18  Yes Mahala Menghini, PA-C  ferrous sulfate 324 (65 Fe) MG TBEC Take by mouth daily.   Yes [provider]  Fluticasone-Salmeterol (WIXELA INHUB) 250-50 MCG/DOSE AEPB Inhale 1 puff into the lungs 2 (two) times daily.   Yes [provider]  metoprolol tartrate (LOPRESSOR) 25 MG tablet TAKE 1 & 1/2 TABLETS BY MOUTH TWICE DAILY 10/06/18  Yes Branch, Alphonse Guild, MD  Rivaroxaban (XARELTO) 15 MG TABS tablet Take 1 tablet (15 mg total) by mouth daily with supper. 11/04/18  Yes Arnoldo Lenis, MD  THYROID PO Take 1  tablet by mouth daily.   Yes [provider]  vitamin B-12 (CYANOCOBALAMIN) 1000 MCG tablet Take 1,000 mcg by mouth daily.   Yes [provider]  cyclobenzaprine (FLEXERIL) 10 MG tablet Take 10 mg by mouth 3 (three) times daily as needed for muscle spasms.    [provider]    Allergies as of 11/10/2018 - Review Complete 11/10/2018  Allergen Reaction Noted  . Latex Itching and Rash 10/20/2013  . Adhesive [tape] Other (See Comments) 10/20/2013  . Betadine [povidone iodine]  06/11/2017  . Ciprofloxacin Nausea And Vomiting 10/20/2013  . Codeine Nausea And Vomiting 10/20/2013  . Penicillins Hives 10/20/2013  . Sulfa antibiotics Hives 10/20/2013  . Wellbutrin [bupropion]  07/21/2017    Family History  Problem Relation Age of Onset  . Heart disease Mother   . Stroke Mother   . Emphysema Father   . Colon polyps Neg Hx   . Colon cancer Neg Hx     Social History   Socioeconomic History  . Marital status: Married    Spouse name: Not on file  . Number of children: Not on file  . Years of education: Not on file  . Highest education level: Not on file  Occupational History  . Not on file  Social Needs  . Financial resource strain: Not on file  . Food insecurity:    Worry: Not on file    Inability: Not on file  . Transportation needs:    Medical: Not on file    Non-medical: Not on file  Tobacco Use  . Smoking status: Never Smoker  . Smokeless tobacco: Never Used  Substance and Sexual Activity  . Alcohol use: No    Alcohol/week: 0.0 standard drinks  . Drug use: No  . Sexual activity: Not Currently    Birth control/protection: None  Lifestyle  . Physical activity:    Days per week: Not on file    Minutes per session: Not on file  . Stress: Not on file  Relationships  . Social connections:    Talks on phone: Not on file    Gets together: Not on file    Attends religious service: Not on file    Active member of club or organization: Not on file     Attends meetings of clubs or organizations: Not on file    Relationship status: Not on file  . Intimate partner violence:    Fear of current or ex partner: Not on file    Emotionally abused: Not on file    Physically abused: Not on file    Forced sexual activity: Not on file  Other Topics Concern  . Not on file  Social History Narrative  . Not on file    Review of Systems: See HPI, otherwise negative ROS  Physical Exam: BP 121/63   Pulse 72   Temp (!) 96.6 F (35.9 C) (Oral)   Ht 5' (1.524 m)  Wt 153 lb 6.4 oz (69.6 kg)   BMI 29.96 kg/m  General:   Frail, elderly lady back confined to the wheelchair.  Accompanied by her daughter.   Neck:  Supple; no masses or thyromegaly. No significant cervical adenopathy. Lungs:  Clear throughout to auscultation.   No wheezes, crackles, or rhonchi. No acute distress. Heart:  Regular rate and rhythm; no murmurs, clicks, rubs,  or gallops. Abdomen: Non-distended, normal bowel sounds.  Soft and nontender without appreciable mass or hepatosplenomegaly.  Impression/Plan: 85 year old lady with vague oropharyngeal/positional dysphagia symptoms.  Prominent xerostomia with no significant oropharyngeal dysfunction or obstruction of her tubular esophagus based on prior evaluation. Xerostomia may be in part contributing to her symptoms of oral pharyngeal dysfunction.  Also, CNS insult with prior stroke may be indirect contributing factor. Reported iron deficiency anemia (I currently do not have documentation); on iron supplementation.  Recommendations: We will check stool for occult blood today.  We will retrieve records from Dr. Lanice Shirts office.  As discussed with patient and her daughter at length today.  I do not necessarily believe, if she is occult blood positive and or iron deficient, that she would be fit for any sort of endoscopic evaluation.  Is been sometime since she had her colon looked at.  Should she have a significant lesion found in her  colon, I doubt she would be a candidate for surgical resection.  Moreover, patient and daughter are not interested in any invasive evaluation and would like to stay out of the operating room if at all possible.  This is certainly not an unreasonable approach.   Recommendations:  For now, will retrieve records from Dr. Lanice Shirts office and assess her stool for occult blood  She should keep her head in a neutral position as much as possible and use Biotene liberally.  Continue Nexium 20 mg daily.  Further recommendations to follow.     Notice: This dictation was prepared with Dragon dictation along with smaller phrase technology. Any transcriptional errors that result from this process are unintentional and may not be corrected upon review.

## 2018-11-13 ENCOUNTER — Ambulatory Visit (INDEPENDENT_AMBULATORY_CARE_PROVIDER_SITE_OTHER): Payer: Medicare HMO | Admitting: Internal Medicine

## 2018-11-13 DIAGNOSIS — D508 Other iron deficiency anemias: Secondary | ICD-10-CM | POA: Diagnosis not present

## 2018-11-13 LAB — IFOBT (OCCULT BLOOD): IFOBT: POSITIVE

## 2018-11-17 ENCOUNTER — Telehealth: Payer: Self-pay

## 2018-11-17 NOTE — Telephone Encounter (Signed)
Pt called to inquire about her IFOBT results. Please advise.

## 2018-11-19 NOTE — Telephone Encounter (Signed)
See results note. 

## 2018-11-20 ENCOUNTER — Other Ambulatory Visit: Payer: Self-pay

## 2018-11-20 DIAGNOSIS — D649 Anemia, unspecified: Secondary | ICD-10-CM

## 2018-11-20 NOTE — Telephone Encounter (Signed)
Noted  

## 2018-12-03 ENCOUNTER — Ambulatory Visit: Payer: Medicare HMO | Admitting: Obstetrics & Gynecology

## 2018-12-04 ENCOUNTER — Ambulatory Visit: Payer: Medicare HMO | Admitting: Obstetrics & Gynecology

## 2018-12-08 ENCOUNTER — Telehealth: Payer: Self-pay | Admitting: Cardiology

## 2018-12-08 MED ORDER — RIVAROXABAN 15 MG PO TABS: 15.0000 mg | ORAL_TABLET | Freq: Every day | ORAL | 0 refills | Status: DC

## 2018-12-08 NOTE — Telephone Encounter (Signed)
Patient calling the office for samples of medication:   1.  What medication and dosage are you requesting samples for?   (XARELTO) 15 MG TABS    2.  Are you currently out of this medication?  No   Please call [phone]

## 2018-12-08 NOTE — Telephone Encounter (Signed)
Pt son will come by to pick up samples

## 2018-12-14 ENCOUNTER — Encounter: Payer: Self-pay | Admitting: Obstetrics & Gynecology

## 2018-12-14 ENCOUNTER — Ambulatory Visit: Payer: Medicare HMO | Admitting: Obstetrics & Gynecology

## 2018-12-14 VITALS — BP 134/72 | HR 68 | Ht 60.0 in | Wt 152.5 lb

## 2018-12-14 DIAGNOSIS — Z4689 Encounter for fitting and adjustment of other specified devices: Secondary | ICD-10-CM | POA: Diagnosis not present

## 2018-12-14 DIAGNOSIS — N993 Prolapse of vaginal vault after hysterectomy: Secondary | ICD-10-CM | POA: Diagnosis not present

## 2018-12-14 NOTE — Progress Notes (Signed)
Chief Complaint  Patient presents with  . Follow-up    on pessary    Blood pressure 134/72, pulse 68, height 5' (1.524 m), weight 152 lb 8 oz (69.2 kg).  Sara Garcia presents today for routine follow up related to her pessary.   She uses a milex ring with support #2 She reports no vaginal discharge or vaginal bleeding.  Exam reveals no undue vaginal mucosal pressure of breakdown, no discharge and no vaginal bleeding.  The pessary is removed, cleaned and replaced without difficulty.    Sara Garcia will be sen back in 6 months for continued follow up.  Florian Buff, MD  12/14/2018 4:06 PM

## 2018-12-22 ENCOUNTER — Ambulatory Visit (HOSPITAL_COMMUNITY)
Admission: RE | Admit: 2018-12-22 | Discharge: 2018-12-22 | Disposition: A | Discharge status: Home / Self Care | Payer: Medicare HMO | Source: Ambulatory Visit | Attending: Nurse Practitioner | Admitting: Nurse Practitioner

## 2018-12-22 ENCOUNTER — Other Ambulatory Visit (HOSPITAL_COMMUNITY): Payer: Self-pay | Admitting: Nurse Practitioner

## 2018-12-22 DIAGNOSIS — M542 Cervicalgia: Secondary | ICD-10-CM | POA: Diagnosis present

## 2018-12-28 ENCOUNTER — Other Ambulatory Visit: Payer: Self-pay

## 2018-12-28 DIAGNOSIS — D649 Anemia, unspecified: Secondary | ICD-10-CM

## 2019-01-04 ENCOUNTER — Telehealth: Payer: Self-pay | Admitting: Cardiology

## 2019-01-04 MED ORDER — RIVAROXABAN 15 MG PO TABS: 15.0000 mg | ORAL_TABLET | Freq: Every day | ORAL | 0 refills | Status: DC

## 2019-01-04 NOTE — Telephone Encounter (Signed)
Pt son will come by office to pick up samples - screened over the phone for exposure to COVID 19

## 2019-01-04 NOTE — Telephone Encounter (Signed)
Patient calling the office for samples of medication: °  °  °1.  What medication and dosage are you requesting samples for?  Rivaroxaban (XARELTO) 15 MG TABS tablet  °  °  °  °2.  Are you currently out of this medication?  °  °

## 2019-01-27 ENCOUNTER — Other Ambulatory Visit: Payer: Self-pay | Admitting: Gastroenterology

## 2019-01-27 ENCOUNTER — Other Ambulatory Visit: Payer: Self-pay

## 2019-01-27 ENCOUNTER — Ambulatory Visit (INDEPENDENT_AMBULATORY_CARE_PROVIDER_SITE_OTHER): Payer: Medicare HMO | Admitting: Gastroenterology

## 2019-01-27 ENCOUNTER — Encounter: Payer: Self-pay | Admitting: Gastroenterology

## 2019-01-27 DIAGNOSIS — R195 Other fecal abnormalities: Secondary | ICD-10-CM

## 2019-01-27 DIAGNOSIS — D508 Other iron deficiency anemias: Secondary | ICD-10-CM | POA: Diagnosis not present

## 2019-01-27 NOTE — Progress Notes (Signed)
Primary Care Physician:  Doree Albee, MD Primary GI:  Garfield Cornea, MD   Patient Location: Home  Provider Location: [facility] office  Reason for Phone Visit: heme positive stool, anemia  Persons present on the phone encounter, with roles: Patient, myself (provider), Christ Kick, CMA (updated meds and allergies)  Total time (minutes) spent on medical discussion: 30 minutes  Due to COVID-19, visit was conducted using telephonic method (no video was available).  Visit was requested by patient.  Virtual Visit via Telephone only  I connected with  Sara Garcia on 01/27/19 at  2:00 PM EDT by telephone and verified that I am speaking with the correct person using two identifiers.   I discussed the limitations, risks, security and privacy concerns of performing an evaluation and management service by telephone and the availability of in person appointments. I also discussed with the patient that there may be a patient responsible charge related to this service. The patient expressed understanding and agreed to proceed.  Chief Complaint  Patient presents with  . Constipation    constipation occasionally,blood in stool,anemia     HPI:   Patient is a pleasant 85 year old female who presents for telephone visit regarding positive stool.  She was last seen in January 2020.  She has a history of vague oropharyngeal/positional dysphagia.  Reports choking on her saliva when she bends her head forward rapidly.  She also has prominent xerostomia.  She has a history of chronic anemia on iron supplement.  Reportedly history of IDA through Dr. Lanice Shirts office.  Stools are chronically dark.  She is on Xarelto for history of DVT, A. fib.  History of reflux controlled on Nexium.  She is had multiple evaluations by speech therapy with no significant findings.  Dysphagia not improved with esophageal dilation.  Records from February 2020 PCP obtained.  Her hemoglobin was 14.  In January her ferritin  was in the low 20 range.  December 2019 her hemoglobin was 12.  I FOBT was positive.  Because patient and daughter were not interested in invasive evaluation the plan was to repeat a CBC in about 8 weeks and go from there.  Doesn't want anything done right now due to the coronavirus. Husband, sister in law, patient all had congestion several months ago and wonder if they already had COVID 93. Plans to call 211 to find out more. From a GI standpoint she has been stable. Some constipation. Takes stool softeners or laxative. No melena, brbpr. No a lot of abdominal pain, rare only. Really not complaining of any UGI symptoms.   Current Outpatient Medications  Medication Sig Dispense Refill  . acetaminophen (TYLENOL) 650 MG CR tablet Take 650 mg by mouth every 8 (eight) hours as needed for pain.    . cholecalciferol (VITAMIN D) 1000 units tablet Take 1,000 Units by mouth 3 (three) times daily.    Marland Kitchen diltiazem (CARDIZEM) 60 MG tablet TAKE 1 TABLET BY MOUTH 3 TIMES A DAY 270 tablet 1  . DULoxetine (CYMBALTA) 60 MG capsule Take 60 mg by mouth 2 (two) times daily.     Marland Kitchen esomeprazole (NEXIUM) 20 MG capsule Take 1 capsule (20 mg total) by mouth daily before breakfast. 30 capsule 11  . Fluticasone-Salmeterol (ADVAIR) 250-50 MCG/DOSE AEPB Inhale 1 puff into the lungs 2 (two) times daily.    . metoprolol tartrate (LOPRESSOR) 25 MG tablet TAKE 1 & 1/2 TABLETS BY MOUTH TWICE DAILY 270 tablet 2  . Rivaroxaban (XARELTO) 15 MG  TABS tablet Take 1 tablet (15 mg total) by mouth daily with supper. 28 tablet 0  . THYROID PO Take 1 tablet by mouth daily. 60 mg daily    . vitamin B-12 (CYANOCOBALAMIN) 1000 MCG tablet Take 1,000 mcg by mouth 3 (three) times daily.      No current facility-administered medications for this visit.     Past Medical History:  Diagnosis Date  . 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 Arthritis   . Asthma   . DVT (deep venous thrombosis) ([place]) 2011/2012  . Esophageal dysphagia    limited food and pill   . Esophageal reflux   . Essential hypertension, benign   . GERD (gastroesophageal reflux disease)   . Osteoporosis   . Overactive bladder   . Paroxysmal atrial fibrillation (HCC)    a. on Xarelto for anticoagulation  . 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 Stroke ([place])   . Unspecified prolapse of vaginal walls     Past Surgical History:  Procedure Laterality Date  . ABDOMINAL HYSTERECTOMY    . COLONOSCOPY W/ BIOPSIES  02/01/2004   RMR: Anal papilla with normal rectum/Sigmoid diverticula/Small polyps in the cecum removed as described above. Inflammatory polyp  . ESOPHAGEAL DILATION     unknown year per patient.  . ESOPHAGEAL DILATION N/A 02/07/2015   RMR: Normal esophagus status post maloney dilation. Small hiatal hernia.   Marland Kitchen ESOPHAGOGASTRODUODENOSCOPY     unknown year per patient  . ESOPHAGOGASTRODUODENOSCOPY N/A 02/07/2015   RMR: Normal esophagus  status post  Maloney dilation. Small hiatal hernia  . FALSE ANEURYSM REPAIR Left 09/23/2017   Procedure: REPAIR FALSE ANEURYSM COMMON LEFT FEMORAL ARTERY WITH OSTEOTOMY OF BONE FRAGEMENT;  Surgeon: Angelia Mould, MD;  Location: [place];  Service: Vascular;  Laterality: Left;  . FEMUR IM NAIL Left 06/11/2017   Procedure: INTRAMEDULLARY (IM) NAIL FEMORAL;  Surgeon: Rod Can, MD;  Location: [facility];  Service: Orthopedics;  Laterality: Left;  . NASAL SEPTUM SURGERY    . TONSILLECTOMY    . WRIST FRACTURE SURGERY       ROS:  General: Negative for anorexia, weight loss, fever, chills, fatigue, weakness. Eyes: Negative for vision changes.  ENT: Negative for hoarseness, difficulty swallowing , nasal congestion. CV: Negative for chest pain, angina, palpitations, dyspnea on exertion, peripheral edema.  Respiratory: Negative for dyspnea at rest, dyspnea on exertion, cough, sputum, wheezing.  GI: See history of present illness. GU:  Negative for dysuria, hematuria, urinary incontinence, urinary frequency, nocturnal urination.  MS: +for  joint pain, low back pain.  Derm: Negative for rash or itching.  Neuro: Negative for weakness, abnormal sensation, seizure, frequent headaches, memory loss, confusion.  Psych: Negative for anxiety, depression, suicidal ideation, hallucinations.  Endo: Negative for unusual weight change.  Heme: Negative for bruising or bleeding. Allergy: Negative for rash or hives.   Observations/Objective: Alert and oriented. No distress. No SOB. Completing sentences without difficulty. Otherwise exam no available.   Assessment and Plan: 85 y/o female with history of mild IDA, heme positive stool. No overt GI bleeding. Not interested in pursuing invasive testing like EGD or colonoscopy. Currently she is not interested in updating labs in setting of COVID 19. We will plan on labs in 04/2019. Return to the office in four months. She will call in the interim if having any problems.   Follow Up Instructions:    I discussed the assessment and treatment plan with the patient. The patient was provided an opportunity to ask questions and  all were answered. The patient agreed with the plan and demonstrated an understanding of the instructions. AVS mailed to patient's home address.   The patient was advised to call back or seek an in-person evaluation if the symptoms worsen or if the condition fails to improve as anticipated.  I provided 30 minutes of non-face-to-face time during this encounter.   Neil Crouch, PA-C

## 2019-01-27 NOTE — Patient Instructions (Signed)
1. We will plan on checking your labs in 04/2019 once COVID 19 crisis has improved. Take orders to lab when you feel safe to go. 2. We will see you back in the office in 05/2019.

## 2019-01-28 ENCOUNTER — Telehealth: Payer: Self-pay | Admitting: Cardiology

## 2019-01-28 NOTE — Telephone Encounter (Signed)
Samples give, see log book

## 2019-01-28 NOTE — Telephone Encounter (Signed)
Patient calling the office for samples of medication:   1. What medication and dosage are you requesting samples for?  Rivaroxaban (XARELTO) 15 MG TABS tablet     2. Are you currentlyout of this medication?

## 2019-01-28 NOTE — Progress Notes (Signed)
CC'D TO PCP °

## 2019-02-02 ENCOUNTER — Ambulatory Visit (INDEPENDENT_AMBULATORY_CARE_PROVIDER_SITE_OTHER): Payer: Medicare HMO | Admitting: Internal Medicine

## 2019-02-07 ENCOUNTER — Encounter: Payer: Self-pay | Admitting: Obstetrics & Gynecology

## 2019-03-02 ENCOUNTER — Telehealth: Payer: Self-pay | Admitting: *Deleted

## 2019-03-02 ENCOUNTER — Telehealth: Payer: Self-pay | Admitting: Cardiology

## 2019-03-02 NOTE — Telephone Encounter (Signed)
Patient verbally consented for telehealth visits with [facility] and understands that her insurance company will be billed for the encounter.   Unable to do vitals

## 2019-03-02 NOTE — Telephone Encounter (Signed)
Patient calling the office for samples of medication:   1.  What medication and dosage are you requesting samples for?   Xarelto   2.  Are you currently out of this medication? Not sure  Please call son Gearldine Shown [phone]

## 2019-03-02 NOTE — Telephone Encounter (Signed)
Xarelto 15 mg , 3 bottles given, lot [id number], exp 03/22

## 2019-03-03 ENCOUNTER — Telehealth (INDEPENDENT_AMBULATORY_CARE_PROVIDER_SITE_OTHER): Payer: Medicare HMO | Admitting: Cardiology

## 2019-03-03 ENCOUNTER — Encounter: Payer: Self-pay | Admitting: Cardiology

## 2019-03-03 DIAGNOSIS — I480 Paroxysmal atrial fibrillation: Secondary | ICD-10-CM

## 2019-03-03 DIAGNOSIS — I10 Essential (primary) hypertension: Secondary | ICD-10-CM

## 2019-03-03 NOTE — Progress Notes (Signed)
Virtual Visit via Telephone Note   This visit type was conducted due to national recommendations for restrictions regarding the COVID-19 Pandemic (e.g. social distancing) in an effort to limit this patient's exposure and mitigate transmission in our community.  Due to her co-morbid illnesses, this patient is at least at moderate risk for complications without adequate follow up.  This format is felt to be most appropriate for this patient at this time.  The patient did not have access to video technology/had technical difficulties with video requiring transitioning to audio format only (telephone).  All issues noted in this document were discussed and addressed.  No physical exam could be performed with this format.  Please refer to the patient's chart for her  consent to telehealth for [facility].   Date:  03/03/2019   ID:  [name], DOB 1927/11/06, MRN [id number]  Patient Location: Home Provider Location: Office  PCP:  Doree Albee, MD  Cardiologist:  Carlyle Dolly, MD  Electrophysiologist:  None   Evaluation Performed:  Follow-Up Visit  Chief Complaint:  6 month follow up  History of Present Illness:    Sara Garcia is a 85 y.o. female with seen today for follow up of the following medical problems.   1. Parox afib - on beta blocker for rate control, eliquis stroke prophylaxis. - previously we had icreased her lopressor to 50mg  bid from 37.5 mg bid. With change she reported worsening fluttering in her chest and fatigue, we cut dose back to 37.5mg  bid.and she felt better  - she has not tolerated higher does of lopressor in the past.   - last visit we changed dilt to 60mg  tid, she reported some palpitations that seem to occur in between doses. There was concern about cost of long acting dilt - deneis any significant palpitations.   - no recent palpitations - compliant with meds  2. Dysphagia - followed by GI  3. HTN - compliant with emds   4. 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 - 01/2018 MRI showed new lacunar infarct. Followed by neuro, recs to continue xarelto   5. Hip fracture - fall 05/2017 leading to fracture - follow up showed she developed a pseduoanerusym in her left groin - s/p pseudoanerusym repair 09/2017. Incision site infection, has been on doxy.  6. Heme + stools - followed by GI - she turned down additional testing, just following labs at this time - has upcoming cbc   The patient does not have symptoms concerning for COVID-19 infection (fever, chills, cough, or new shortness of breath).    Past Medical History:  Diagnosis Date  . 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 Arthritis   . Asthma   . DVT (deep venous thrombosis) ([place]) 2011/2012  . Esophageal dysphagia    limited food and pill  . Esophageal reflux   . Essential hypertension, benign   . GERD (gastroesophageal reflux disease)   . Osteoporosis   . Overactive bladder   . Paroxysmal atrial fibrillation (HCC)    a. on Xarelto for anticoagulation  . 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 Stroke ([place])   . Unspecified prolapse of vaginal walls    Past Surgical History:  Procedure Laterality Date  . ABDOMINAL HYSTERECTOMY    . COLONOSCOPY W/ BIOPSIES  02/01/2004   RMR: Anal papilla with normal rectum/Sigmoid diverticula/Small polyps in the cecum removed as described above. Inflammatory  polyp  . ESOPHAGEAL DILATION     unknown year per patient.  . ESOPHAGEAL DILATION N/A 02/07/2015   RMR: Normal esophagus status post maloney dilation. Small hiatal hernia.   Marland Kitchen ESOPHAGOGASTRODUODENOSCOPY     unknown year per patient  . ESOPHAGOGASTRODUODENOSCOPY N/A 02/07/2015   RMR: Normal esophagus  status post  Maloney dilation. Small hiatal hernia  . FALSE ANEURYSM REPAIR Left 09/23/2017   Procedure: REPAIR FALSE ANEURYSM COMMON LEFT FEMORAL ARTERY WITH OSTEOTOMY OF BONE FRAGEMENT;   Surgeon: Angelia Mould, MD;  Location: [facility];  Service: Vascular;  Laterality: Left;  . FEMUR IM NAIL Left 06/11/2017   Procedure: INTRAMEDULLARY (IM) NAIL FEMORAL;  Surgeon: Rod Can, MD;  Location[facility];  Service: Orthopedics;  Laterality: Left;  . NASAL SEPTUM SURGERY    . TONSILLECTOMY    . WRIST FRACTURE SURGERY       No outpatient medications have been marked as taking for the 03/03/19 encounter (Appointment) with Arnoldo Lenis, MD.     Allergies:   Latex; Adhesive [tape]; Betadine [povidone iodine]; Ciprofloxacin; Codeine; Penicillins; Sulfa antibiotics; and Wellbutrin [bupropion]   Social History   Tobacco Use  . Smoking status: Never Smoker  . Smokeless tobacco: Never Used  Substance Use Topics  . Alcohol use: No    Alcohol/week: 0.0 standard drinks  . Drug use: No     Family Hx: The patient's family history includes Emphysema in her father; Heart disease in her mother; Stroke in her mother. There is no history of Colon polyps or Colon cancer.  ROS:   Please see the history of present illness.     All other systems reviewed and are negative.   Prior CV studies:   The following studies were reviewed today:  12/2013 Lexicscan MPI Impression Exercise Capacity: Lexiscan with no exercise.  BP Response: Normal blood pressure response.  Clinical Symptoms: Nausea  ECG Impression: No significant ST segment change suggestive of ischemia.  Comparison with Prior Nuclear Study: No previous nuclear study performed  Overall Impression: Normal stress nuclear study.  LV Ejection Fraction: 85%. LV Wall Motion: NL LV Function; NL Wall Motion    Jan 2015 echo Study Conclusions  - Study data: Technically difficult study. - Left ventricle: The cavity size was normal. Wall thickness was increased in a pattern of mild LVH. There is mild basal septal hypertrophy without obstructive gradient. Systolic function was normal. The estimated ejection  fraction was in the range of 60% to 65%. Doppler parameters are consistent with abnormal left ventricular relaxation (grade 1 diastolic dysfunction). - Aortic valve: Mildly calcified annulus. Trileaflet; mildly thickened leaflets. Trivial regurgitation. Valve area: 1.69cm^2(VTI). Valve area: 1.79cm^2 (Vmax). - Mitral valve: Mildly calcified annulus. Mildly thickened leaflets .   01/2015 Holter monitor No significant arrhythmias   07/2017 echo Study Conclusions  - Left ventricle: The cavity size was normal. There was mild focal basal hypertrophy of the septum. Systolic function was normal. The estimated ejection fraction was in the range of 55% to 60%. Doppler parameters are consistent with both elevated ventricular end-diastolic filling pressure and elevated left atrial filling pressure. - Mitral valve: Calcified annulus. Mildly thickened leaflets . - Right ventricle: The cavity size was mildly dilated. - Atrial septum: No defect or patent foramen ovale was identified.  Labs/Other Tests and Data Reviewed:    EKG:  No ECG reviewed.  Recent Labs: No results found for requested labs within last 8760 hours.   Recent Lipid Panel Lab Results  Component Value Date/Time  CHOL 153 01/22/2018 10:39 AM   TRIG 141 01/22/2018 10:39 AM   HDL 58 01/22/2018 10:39 AM   CHOLHDL 2.6 01/22/2018 10:39 AM   CHOLHDL 2.6 07/22/2017 03:58 AM   LDLCALC 67 01/22/2018 10:39 AM    Wt Readings from Last 3 Encounters:  12/14/18 152 lb 8 oz (69.2 kg)  11/10/18 153 lb 6.4 oz (69.6 kg)  07/14/18 149 lb 12.8 oz (67.9 kg)     Objective:    Vital Signs:  There were no vitals filed for this visit. There is no height or weight on file to calculate BMI.   Normal affect. Normal speech pattern and tone. Comfortable, no apparent distress. No apparent signs of SOB or wheezing.   ASSESSMENT & PLAN:    1. Parox afib -no significant symptoms, continue curren tmeds - prior embolic  CVA, would avoid stopping xarelto unless abosultely neccesary in setting of recent  FOBT but stable H&H  2. HTN - continue current meds   COVID-19 Education: The signs and symptoms of COVID-19 were discussed with the patient and how to seek care for testing (follow up with PCP or arrange E-visit).  The importance of social distancing was discussed today.  Time:   Today, I have spent 12 minutes with the patient with telehealth technology discussing the above problems.     Medication Adjustments/Labs and Tests Ordered: Current medicines are reviewed at length with the patient today.  Concerns regarding medicines are outlined above.   Tests Ordered: No orders of the defined types were placed in this encounter.   Medication Changes: No orders of the defined types were placed in this encounter.   Disposition:  Follow up 6 months  Signed, Carlyle Dolly, MD  03/03/2019 11:49 AM    [place]

## 2019-03-03 NOTE — Patient Instructions (Signed)

## 2019-03-29 ENCOUNTER — Telehealth: Payer: Self-pay | Admitting: Cardiology

## 2019-03-29 MED ORDER — RIVAROXABAN 15 MG PO TABS: 15.0000 mg | ORAL_TABLET | Freq: Every day | ORAL | 0 refills | Status: DC

## 2019-03-29 NOTE — Telephone Encounter (Signed)
Asking for Xarelto samples  Also asking paperwork for help. He needs to know what he needs to do

## 2019-03-29 NOTE — Telephone Encounter (Signed)
Advised that xarelto samples and patient assistance application are available for pick up

## 2019-04-15 ENCOUNTER — Telehealth: Payer: Self-pay | Admitting: Cardiology

## 2019-04-15 NOTE — Telephone Encounter (Signed)
Patient called about medication assistance form. States that she is not able to complete the form.  [phone].

## 2019-04-15 NOTE — Telephone Encounter (Signed)
Pt needs help filling out application for Xarelto pt assistance - aware that if she would provide income and out of pocket medication expenses and bring or mail to office we would fill it out for her. Pt appreciative and will have sone bring info to office

## 2019-04-16 IMAGING — US US EXTREM LOW ARTERIAL LIMITED
1 series · 14 of 25 positions shown · non-contrast
Comparison: CT 09/17/2017

CLINICAL DATA: Left lower extremity pain. Abnormality seen in the
left groin and on prior CT concerning for pseudoaneurysm or AV
fistula.

EXAM:
LEFT LOWER EXTREMITY ARTERIAL DUPLEX SCAN
TECHNIQUE: Gray-scale sonography as well as color Doppler and duplex ultrasound
was performed to evaluate the lower extremity arteries including the
common, superficial and profunda femoral arteries, popliteal artery
and calf arteries.

[Series 1: us extrem low arterial limited · 0.08mm/px · 29 acquisitions, 14 frames shown]
[im 1/29]
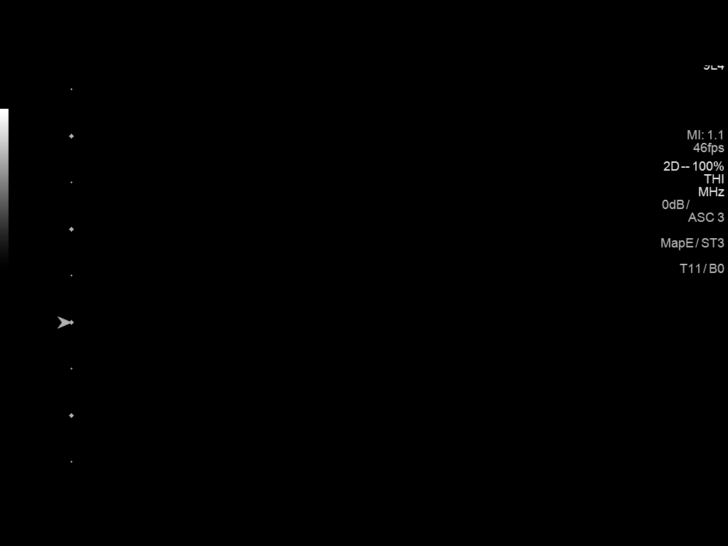
[im 3/29]
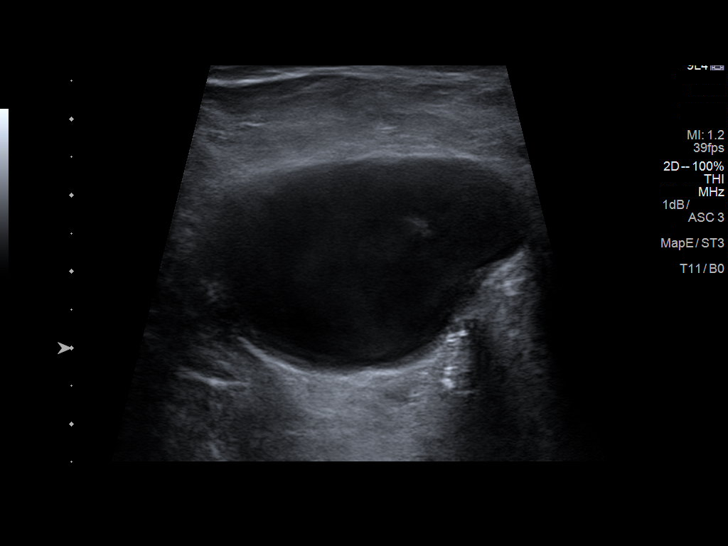
[im 5/29]
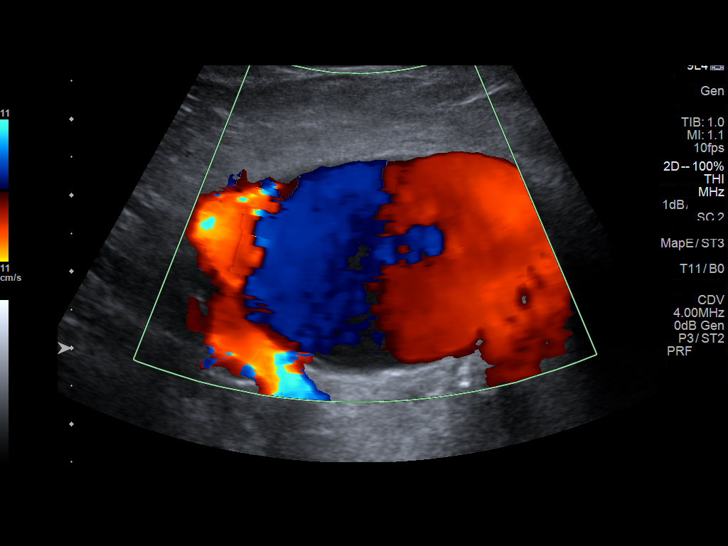
[im 8/29]
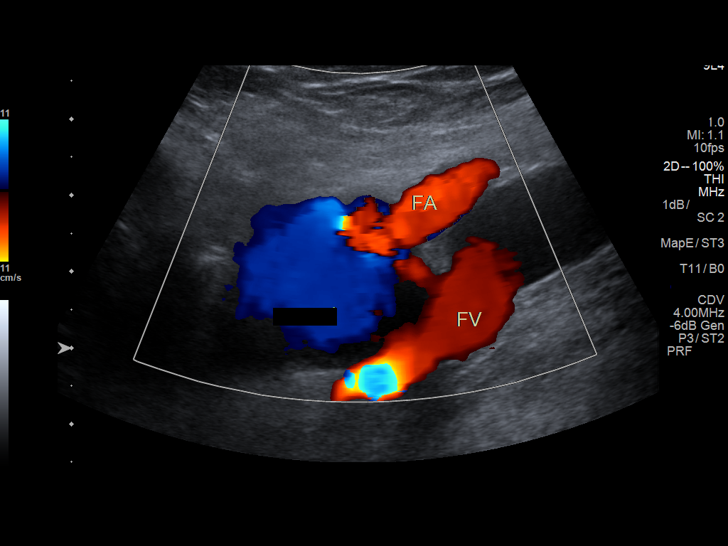
[im 10/29]
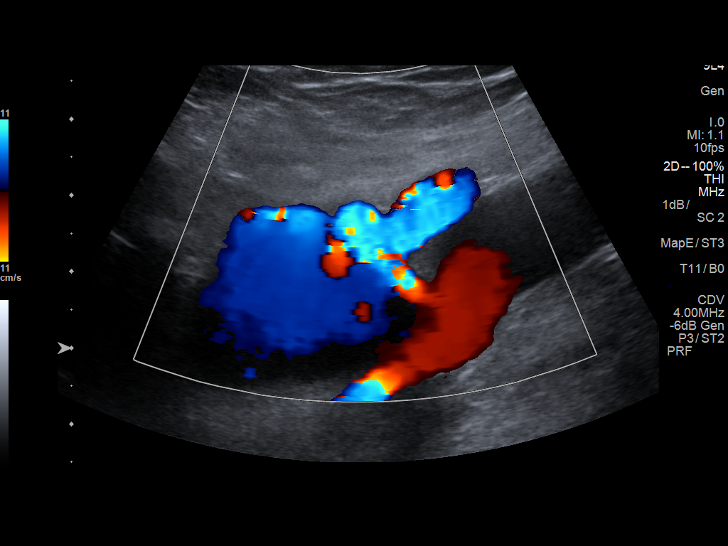
[im 11/29]
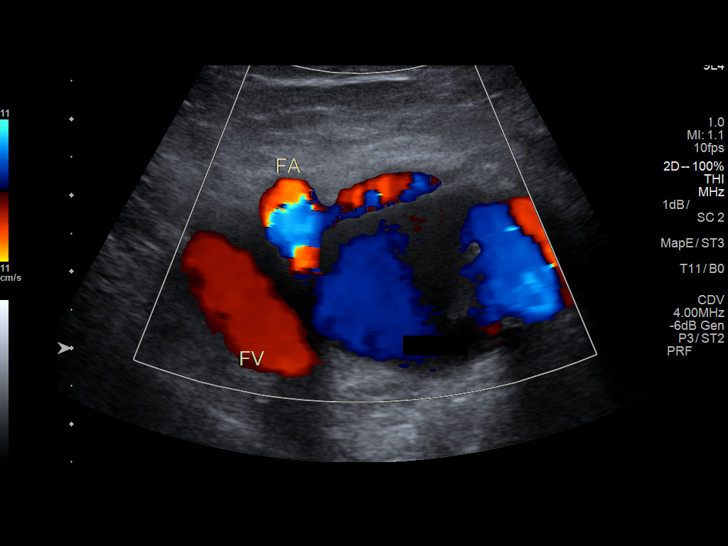
[im 13/29]
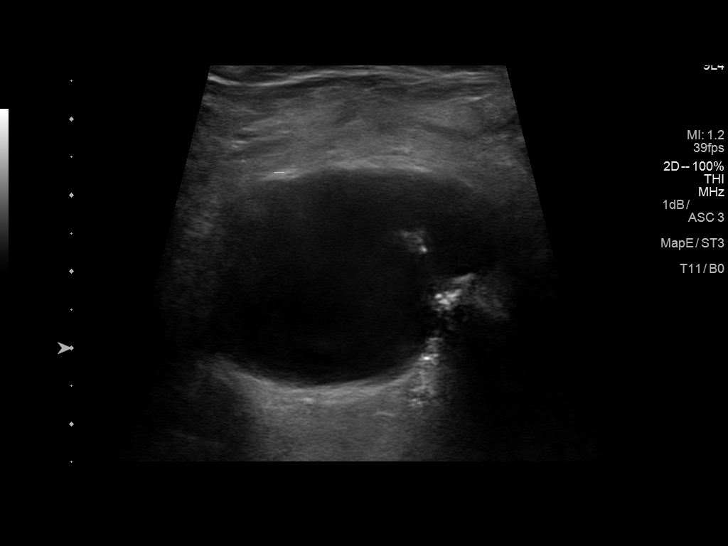
[im 16/29]
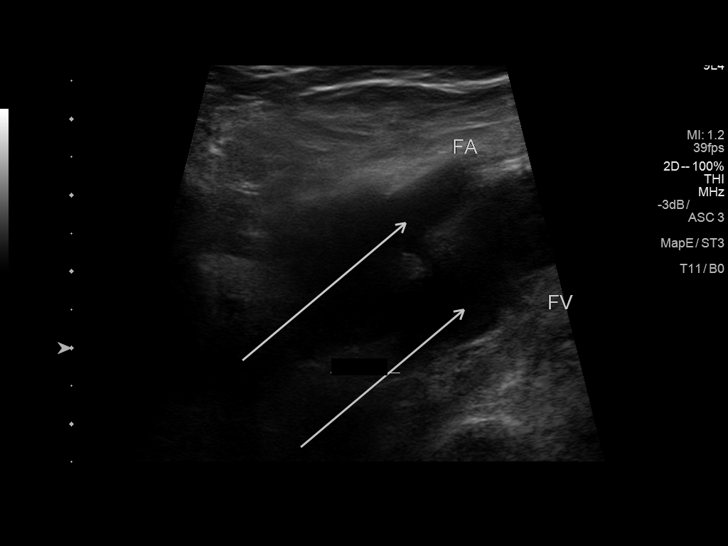
[im 18/29]
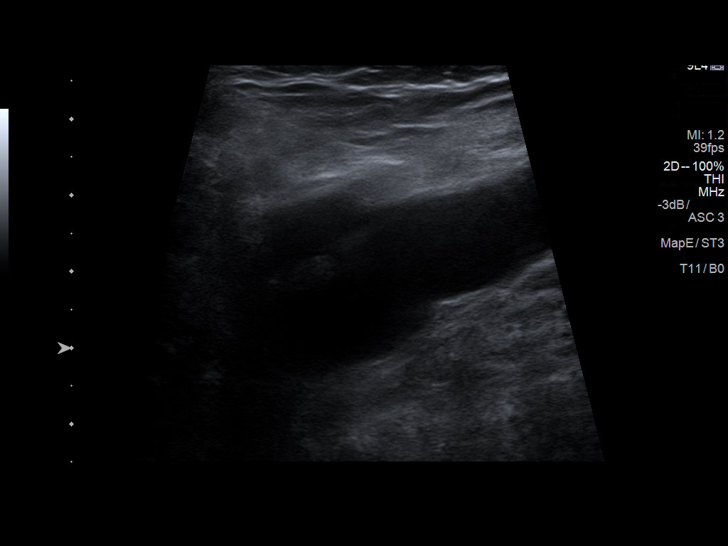
[im 19/29]
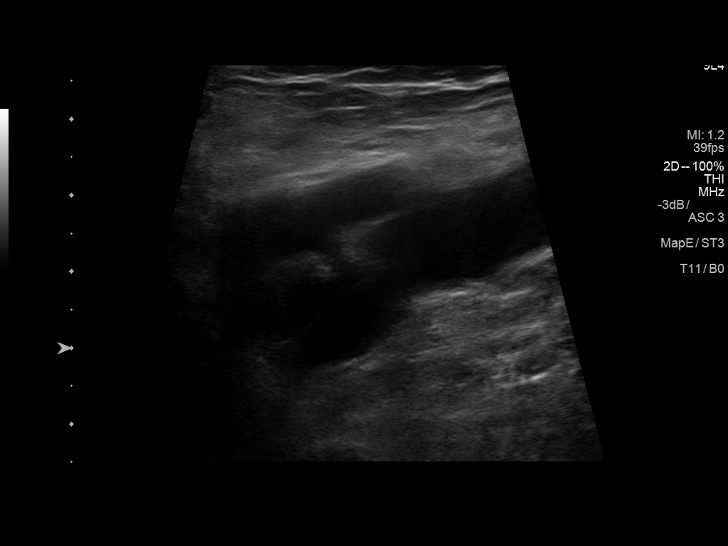
[im 22/29]
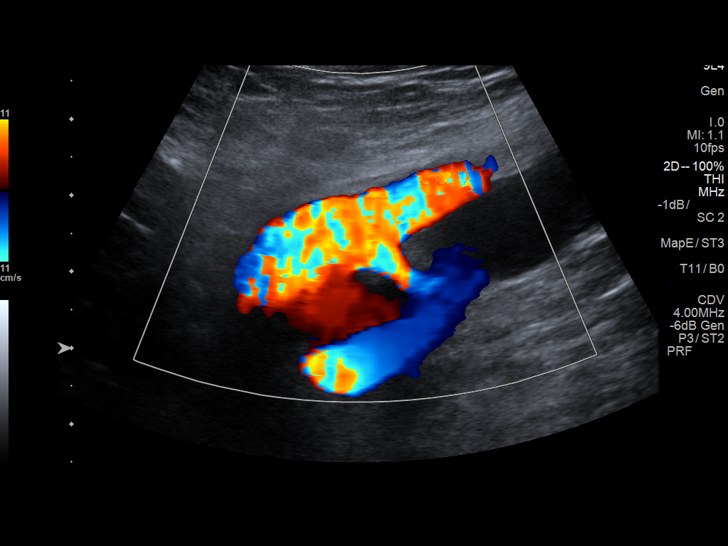
[im 24/29]
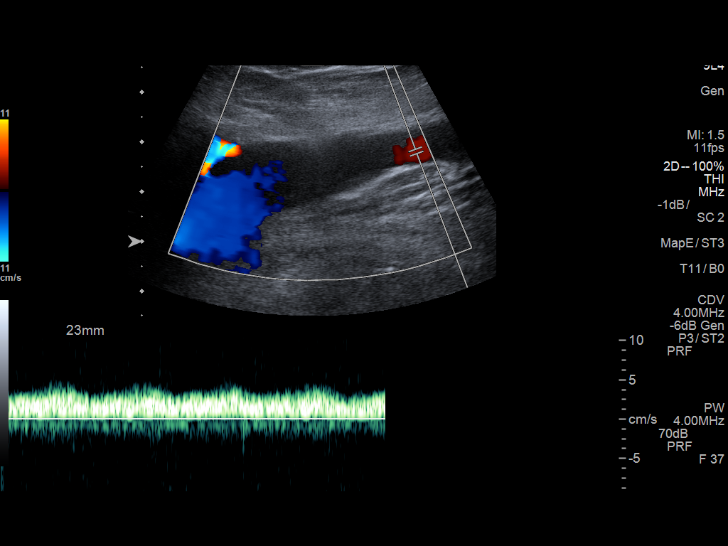
[im 26/29]
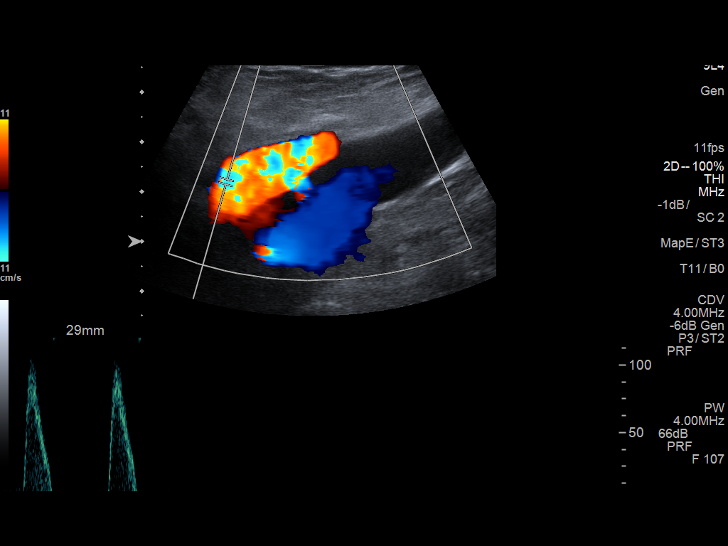
[im 29/29]
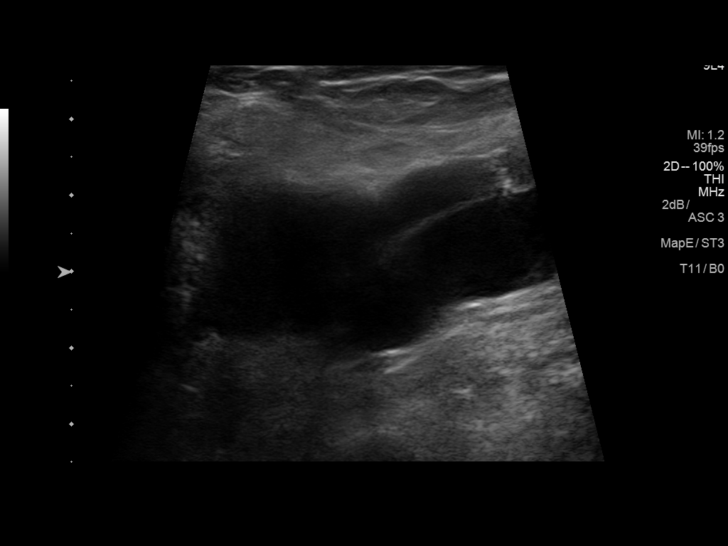

[14 of 25 positions shown; findings below may reference images not displayed]

FINDINGS: There is a large vascular masslike area noted in the left groin
region which appears to be fed by the left common femoral artery
compatible with pseudoaneurysm. However, there also appears to be
communication to the left common femoral vein with pulsatile flow
within the common femoral vein above this area compatible with AVF
fistula.
IMPRESSION: Left common femoral artery pseudoaneurysm and AV fistula to the left
common femoral vein.

These results were called by telephone at the time of interpretation
on 09/18/2017 at [DATE] to Dr. ANA MARTINS ZAU , who verbally
acknowledged these results.

## 2019-04-20 ENCOUNTER — Other Ambulatory Visit: Payer: Self-pay | Admitting: Cardiology

## 2019-04-23 ENCOUNTER — Other Ambulatory Visit: Payer: Self-pay | Admitting: *Deleted

## 2019-04-23 MED ORDER — RIVAROXABAN 15 MG PO TABS: 15.0000 mg | ORAL_TABLET | Freq: Every day | ORAL | 3 refills | Status: DC

## 2019-04-23 MED ORDER — RIVAROXABAN 15 MG PO TABS: 15.0000 mg | ORAL_TABLET | Freq: Every day | ORAL | 0 refills | Status: DC

## 2019-04-23 NOTE — Telephone Encounter (Addendum)
Samples given and patient assistance application in process by [facility].

## 2019-04-27 IMAGING — CT CT CTA ABD/PEL W/CM AND/OR W/O CM
3 of 10 series · 10 of 46 positions shown, 16 images · IV contrast (iopamidol)
Comparison: Pelvic CT 09/17/2017, CT abdomen and pelvis 09/11/2015

CLINICAL DATA: Left groin pain after left-sided hip surgery
06/11/2017.

EXAM:
CTA ABDOMEN AND PELVIS WITHOUT AND WITH CONTRAST
TECHNIQUE: Multidetector CT imaging of the abdomen and pelvis was performed
using the standard protocol during bolus administration of
intravenous contrast. Multiplanar reconstructed images and MIPs were
obtained and reviewed to evaluate the vascular anatomy.
CONTRAST:  100mL [id number]-XM0 IOPAMIDOL ([id number]-XM0) INJECTION 76%

[Series 6: arterial · axial · arterial · 0.71mm/px · z∈[+822,+936]mm · 3 of 140 slices shown]
[im 13/140  soft-tissue]
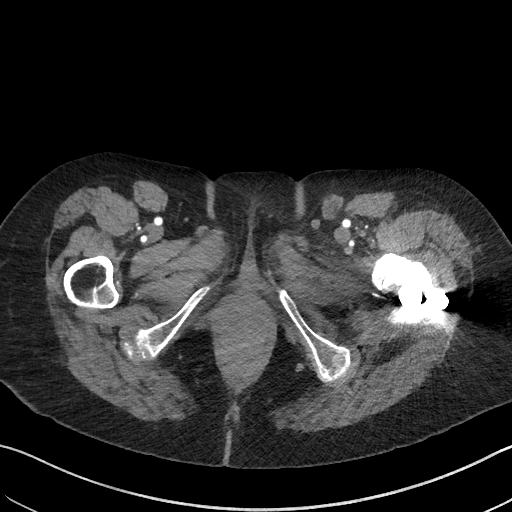
[im 26/140  soft-tissue]
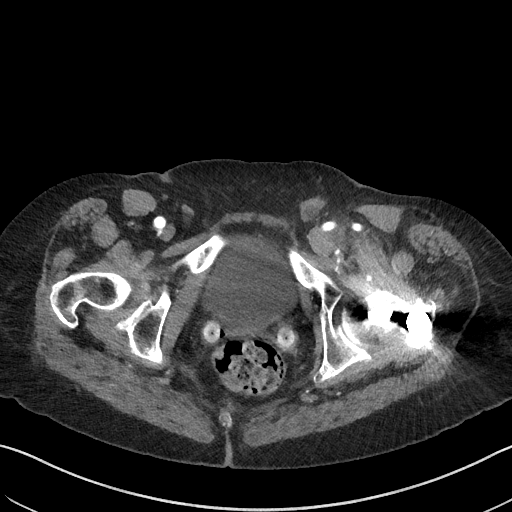
[im 51/140  soft-tissue]
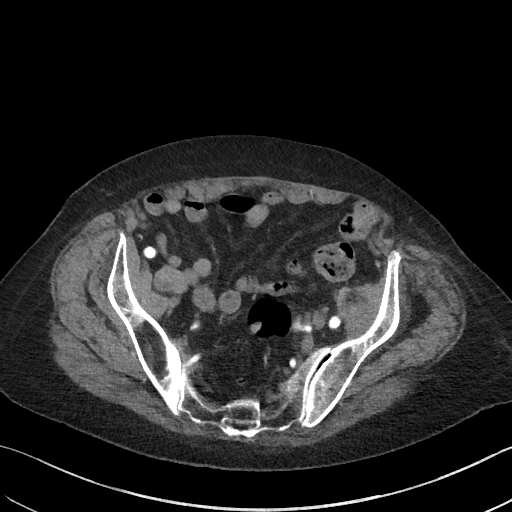

[Series 12: venous 5.0 · axial · portal-venous · 0.71mm/px · z∈[+852,+1132]mm · 5 of 86 slices shown, 10 images]
[im 15/86  soft-tissue]
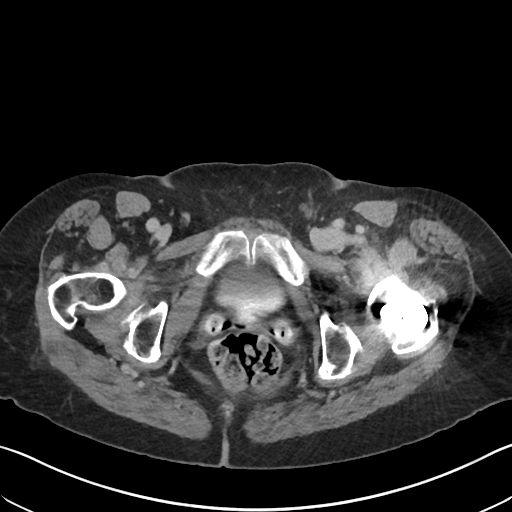
[im 15/86  bone]
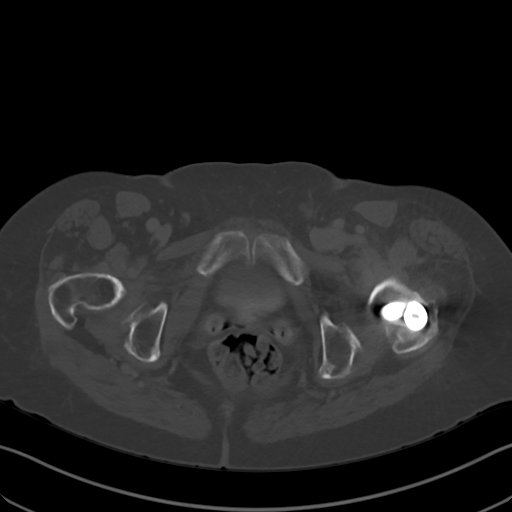
[im 29/86  soft-tissue]
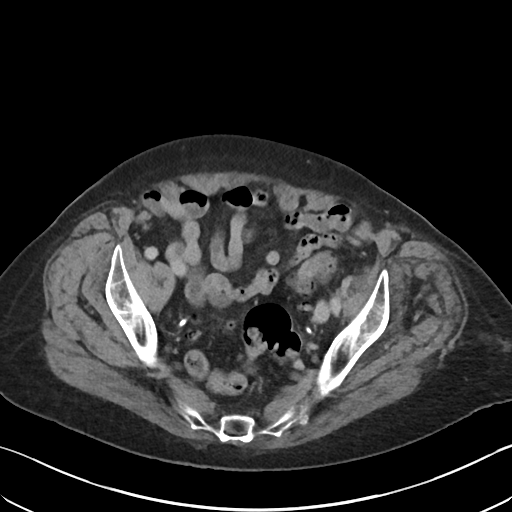
[im 29/86  lung]
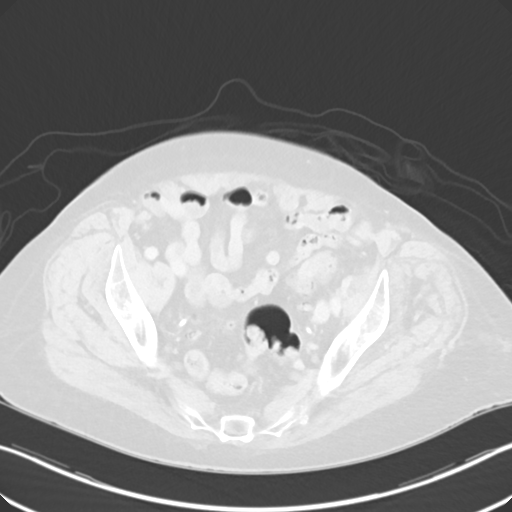
[im 43/86  soft-tissue]
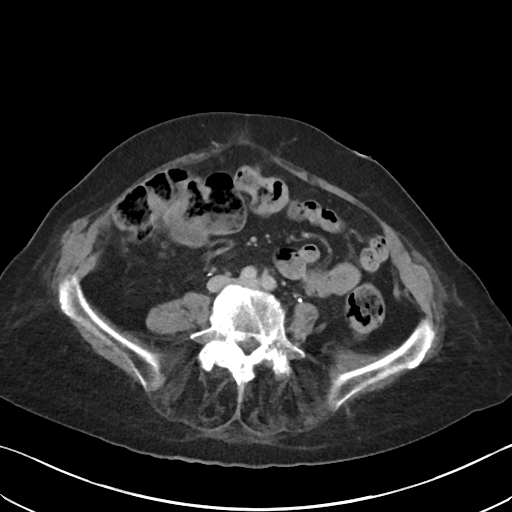
[im 43/86  lung]
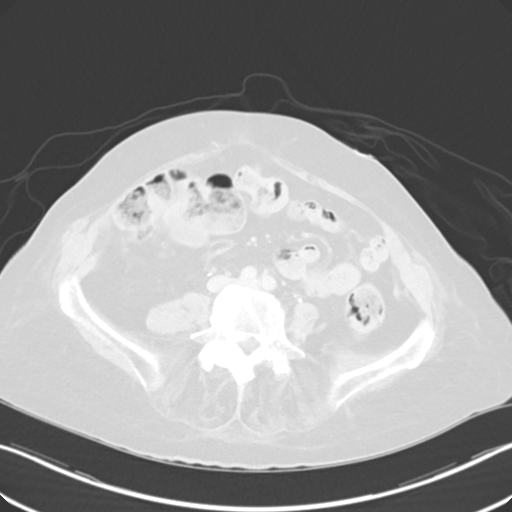
[im 57/86  soft-tissue]
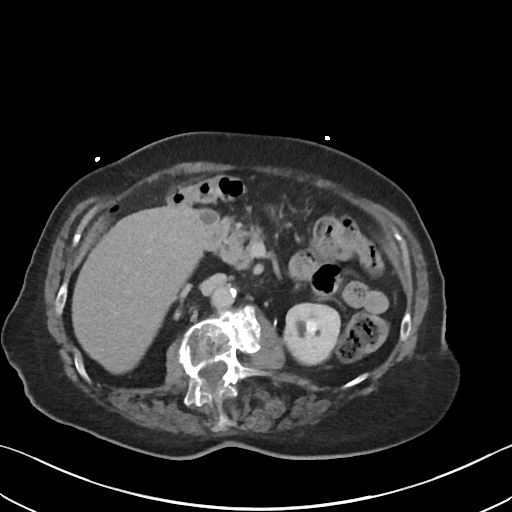
[im 57/86  lung]
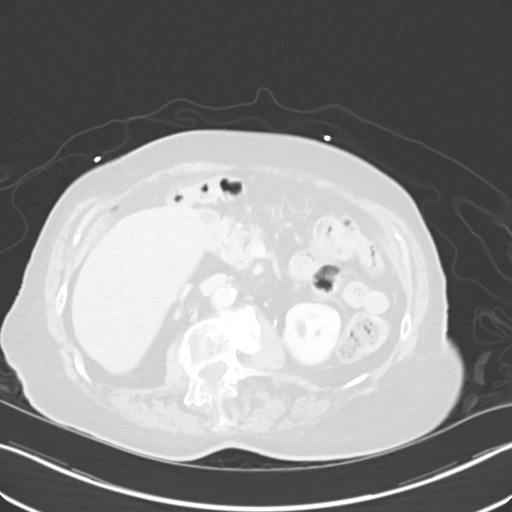
[im 71/86  soft-tissue]
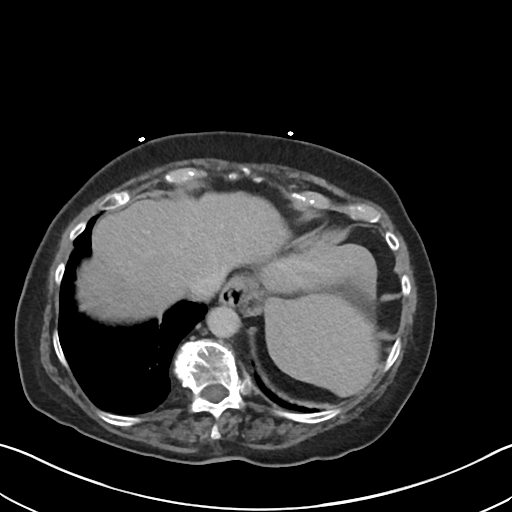
[im 71/86  lung]
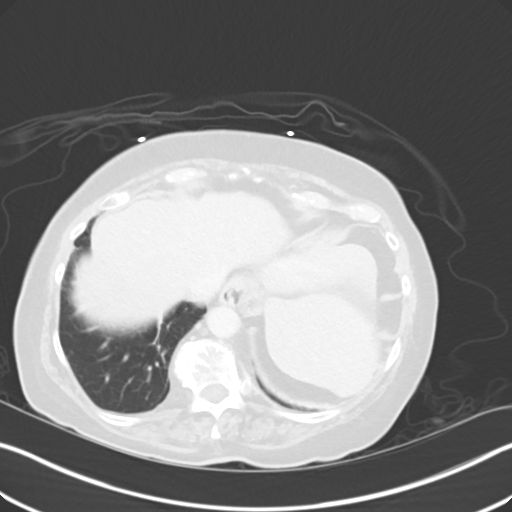

[Series 15: venous 2.0 cor · coronal · portal-venous · 0.75mm/px · 2 of 138 slices shown, 3 images]
[im 46/138  soft-tissue]
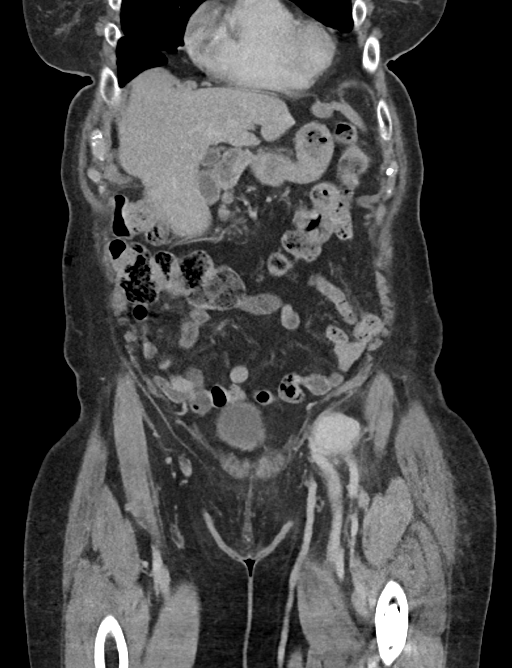
[im 46/138  bone]
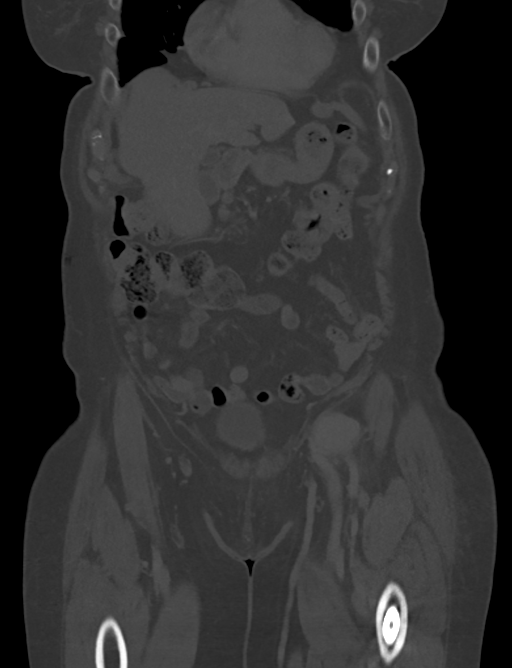
[im 92/138  soft-tissue]
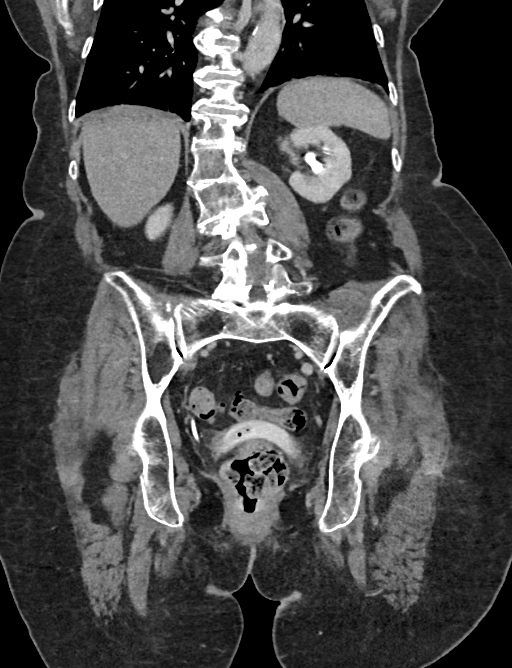

[10 of 46 positions shown; findings below may reference images not displayed]

FINDINGS: VASCULAR

Aorta: Mild aortic atherosclerosis without aneurysm. No dissection
or significant stenosis.

Celiac: Patent without evidence of aneurysm, dissection, vasculitis
or significant stenosis.

SMA: Patent without evidence of aneurysm, dissection, vasculitis or
significant stenosis.

Renals: Single patent renal artery to the right kidney. Dual left
renal arteries, the more cephalad is diminutive in caliber

IMA: Patent without evidence of aneurysm, dissection, vasculitis or
significant stenosis.

Inflow: Patent without evidence of aneurysm, dissection, vasculitis
or significant stenosis.

Proximal Outflow: A 4.1 x 3.3 cm circumscribed soft tissue
abnormality deviating the left common femoral and profunda arteries
is noted in the left groin.

Veins: On the venous phase, the left groin collection enhances
homogeneously with displacement of the left common femoral and
profunda veins.

Review of the MIP images confirms the above findings.

NON-VASCULAR

Lower chest: Cardiomegaly without pericardial effusion. Chronic
right middle lobe opacity with air bronchograms may reflect an area
of chronic atelectasis. Similar finding in the left lower lobe,
stable in appearance.

Hepatobiliary: No focal liver abnormality is seen. No gallstones,
gallbladder wall thickening, or biliary dilatation.

Pancreas: Unremarkable. No pancreatic ductal dilatation or
surrounding inflammatory changes.

Spleen: Normal in size without focal abnormality.

Adrenals/Urinary Tract: Adrenal glands are unremarkable. Kidneys are
normal, without renal calculi, focal lesion, or hydronephrosis.
Bladder is unremarkable.

Stomach/Bowel: Decompressed stomach. Normal small bowel rotation.
Moderate stool burden with scattered colonic diverticulosis. No
bowel obstruction or inflammation.

Lymphatic: No lymphadenopathy.

Reproductive: Pessary noted. Status post hysterectomy. No adnexal
mass.

Other: None

Musculoskeletal: Left femoral neck fixation with intramedullary
nail. Displaced ossific density anterior to the left hip joint as
before consistent with a fragment of the lesser trochanter. This
abuts the collection from a posterolateral approach.
IMPRESSION: VASCULAR

Minimally larger 4.1 x 3.3 cm left groin collection displacing the
left common femoral artery and profunda. Thin wispy contrast is seen
along the periphery of this collection and is suspicious for a
posttraumatic extravasation into the pseudoaneurysm off the distal
aspect of the common femoral artery, just proximal to its
bifurcation of the profunda. Given its intimate association with the
adjacent left common femoral vein and profunda vein, a venous source
of this collection is not entirely excluded. Ultrasound may help for
further correlation.

NON-VASCULAR

1. Cardiomegaly without pericardial effusion.
2. Chronic right middle and left lower lobe atelectasis accounting
for chronic opacities within the lungs dating back to 2187.
3. No acute intraabdominal nor pelvic abnormality.

## 2019-04-28 IMAGING — DX DG CHEST 2V
2 series · 2 of 2 positions shown · non-contrast
Comparison: 07/21/2017

CLINICAL DATA: Preoperative radiograph.

EXAM:
CHEST  2 VIEW

[x chest ap]
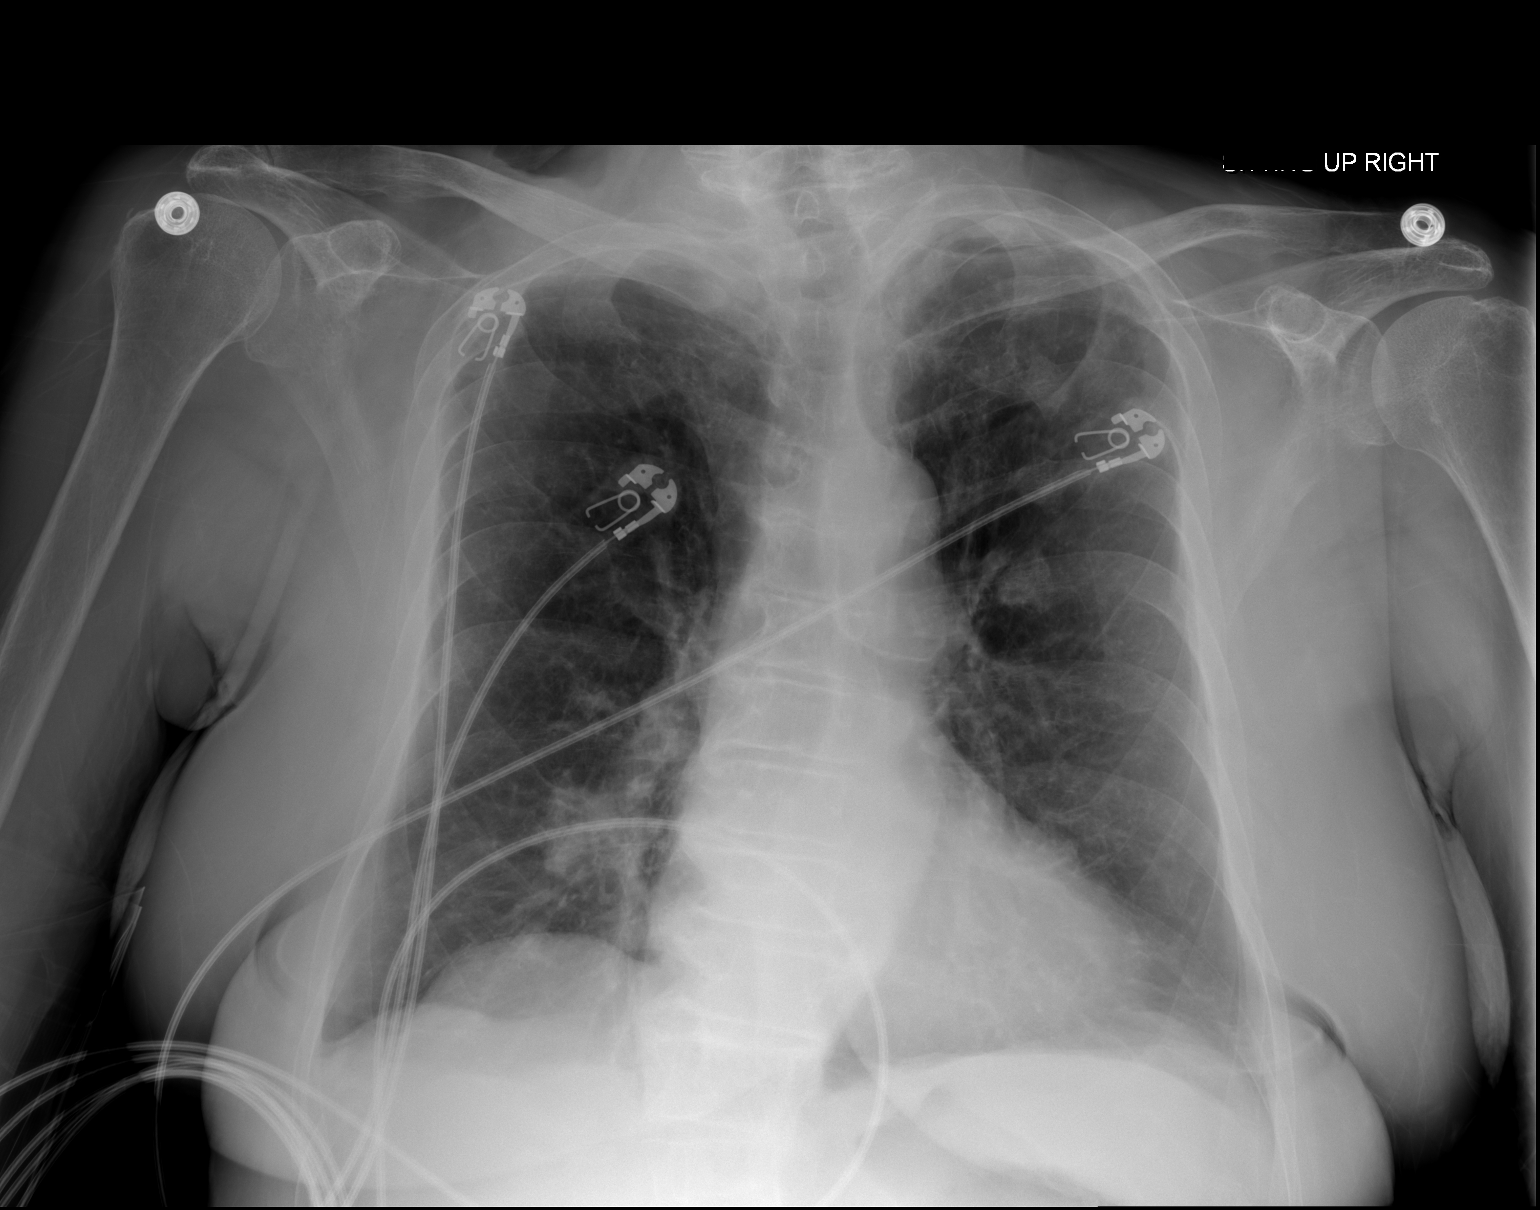

[w chest lat]
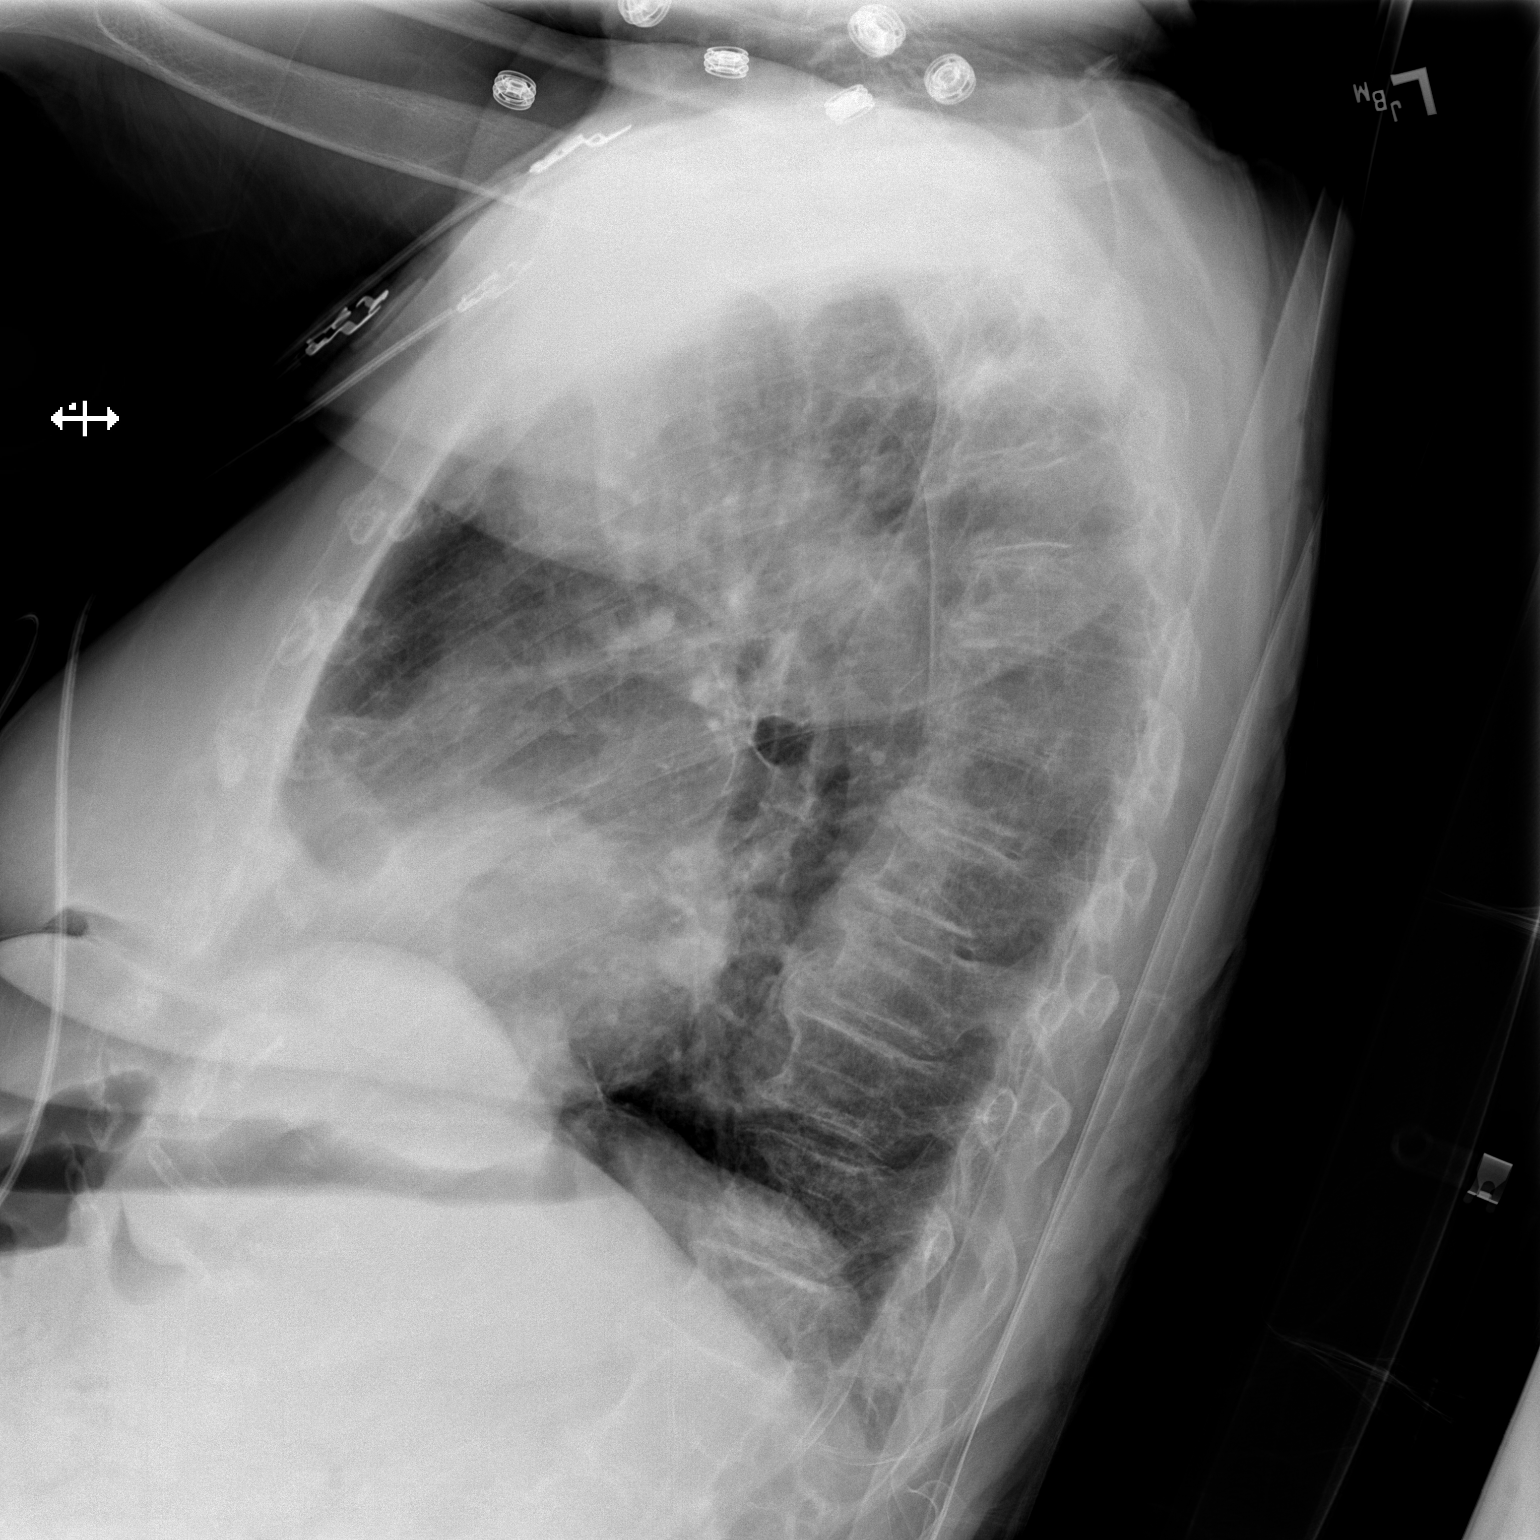

[2 of 2 positions shown; findings below may reference images not displayed]

FINDINGS: Cardiomediastinal silhouette is normal. Mediastinal contours appear
intact. Calcific atherosclerotic disease and tortuosity of the
aorta.

There is no evidence of focal airspace consolidation, pleural
effusion or pneumothorax. Right middle lobe mass projects as
infrahilar right hemithorax density.

Osseous structures are without acute abnormality. Soft tissues are
grossly normal.
IMPRESSION: Tortuosity and calcific atherosclerotic disease of the aorta.

Persistent right middle lobe mass.

## 2019-05-17 ENCOUNTER — Telehealth: Payer: Self-pay

## 2019-05-17 ENCOUNTER — Telehealth: Payer: Self-pay | Admitting: Obstetrics & Gynecology

## 2019-05-17 NOTE — Telephone Encounter (Signed)
Pt called to schedule a Telephone visit with RMR (f/u from 01/2019 apt). Offered other provider appointment. Pt would like to have her visit with RMR.

## 2019-05-17 NOTE — Telephone Encounter (Signed)
Pt states that before COVID she was seeing several doctors about her losing blood. She wants to speak with Dr. Elonda Husky to see if this is something he needs to examine. She states she is unsure if it could be related to her pessary.

## 2019-05-18 ENCOUNTER — Telehealth: Payer: Self-pay | Admitting: Cardiology

## 2019-05-18 ENCOUNTER — Encounter: Payer: Self-pay | Admitting: Internal Medicine

## 2019-05-18 MED ORDER — RIVAROXABAN 15 MG PO TABS: 15.0000 mg | ORAL_TABLET | Freq: Every day | ORAL | 0 refills | Status: DC

## 2019-05-18 NOTE — Telephone Encounter (Signed)
It is not related to the pessary

## 2019-05-18 NOTE — Telephone Encounter (Signed)
Patient scheduled.

## 2019-05-18 NOTE — Telephone Encounter (Signed)
Left detailed message that Xarelto samples were available

## 2019-05-18 NOTE — Telephone Encounter (Signed)
Patient calling the office for samples of medication:   1.  What medication and dosage are you requesting samples for? Xalreto  2.  Are you currently out of this medication?

## 2019-05-25 ENCOUNTER — Telehealth: Payer: Self-pay | Admitting: Internal Medicine

## 2019-05-25 NOTE — Telephone Encounter (Signed)
Pt wanted to changed her OV with RMR on 9/10 to a telephone visit and also has questions for the nurse. Please call her at [phone]

## 2019-05-25 NOTE — Telephone Encounter (Signed)
Pt wants to have a telephone visit instead of coming to the office to see RMR on 06/24/2019. She also had questions for the nurse and asked for a return call. [phone]

## 2019-05-25 NOTE — Telephone Encounter (Signed)
Called patient. She stated she could not remember why she was calling or her question but if she thinks of it she will call back.

## 2019-06-24 ENCOUNTER — Encounter: Payer: Self-pay | Admitting: Internal Medicine

## 2019-06-24 ENCOUNTER — Other Ambulatory Visit: Payer: Self-pay

## 2019-06-24 ENCOUNTER — Ambulatory Visit (INDEPENDENT_AMBULATORY_CARE_PROVIDER_SITE_OTHER): Payer: Medicare HMO | Admitting: Internal Medicine

## 2019-06-24 DIAGNOSIS — R195 Other fecal abnormalities: Secondary | ICD-10-CM | POA: Diagnosis not present

## 2019-06-24 DIAGNOSIS — K219 Gastro-esophageal reflux disease without esophagitis: Secondary | ICD-10-CM | POA: Diagnosis not present

## 2019-06-24 DIAGNOSIS — D649 Anemia, unspecified: Secondary | ICD-10-CM

## 2019-06-24 DIAGNOSIS — D508 Other iron deficiency anemias: Secondary | ICD-10-CM | POA: Diagnosis not present

## 2019-06-24 NOTE — Progress Notes (Signed)
Referring Provider:  Primary Care Physician:  Doree Albee, MD  Primary GI:   Patient Location: Home   Provider Location: [place] office   Reason for Visit:    Persons present on the virtual encounter, with roles:    Total time (minutes) spent on medical discussion: ### minutes   Due to COVID-19, visit was conducted using virtual method.  Visit was requested by patient.  Virtual Visit via Telephone Note Due to COVID-19, visit is conducted virtually and was requested by patient.   I connected with Sara Garcia on 06/24/19 at 10:00 AM EDT by telephone and verified that I am speaking with the correct person using two identifiers.   I discussed the limitations, risks, security and privacy concerns of performing an evaluation and management service by telephone and the availability of in person appointments. I also discussed with the patient that there may be a patient responsible charge related to this service. The patient expressed understanding and agreed to proceed.  Chief Complaint  Patient presents with  . Anemia    f/u  . Blood In Stools     History of Present Illness:   85 year old lady with a history of occult blood in stool.  Has not gotten iron studies/CBC because of the pandemic.  States alternating constipation/ diarrhea.  Still has some swallowing dysfunction previously evaluated with barium pill esophagram and speech therapy evaluation.  Age-related changes.  Not much for speech pathology to offer.  Prior EGD demonstrated normal-appearing tubular esophagus.  Empirically dilated with a 63 [place] dilator-did not make much difference.  She has alternating constipation and diarrhea more chronic issues.  No melena or hematochezia. Feels like she needs to be seen in the office face-to-face (our last 2 visits have been virtual); today's telephone only.  Past Medical History:  Diagnosis Date  . 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 Arthritis   . Asthma   . DVT (deep venous  thrombosis) ([place]) 2011/2012  . Esophageal dysphagia    limited food and pill  . Esophageal reflux   . Essential hypertension, benign   . GERD (gastroesophageal reflux disease)   . Osteoporosis   . Overactive bladder   . Paroxysmal atrial fibrillation ([facility])    a. on Xarelto for anticoagulation  . 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 Stroke ([place])   . Unspecified prolapse of vaginal walls      Past Surgical History:  Procedure Laterality Date  . ABDOMINAL HYSTERECTOMY    . COLONOSCOPY W/ BIOPSIES  02/01/2004   RMR: Anal papilla with normal rectum/Sigmoid diverticula/Small polyps in the cecum removed as described above. Inflammatory polyp  . ESOPHAGEAL DILATION     unknown year per patient.  . ESOPHAGEAL DILATION N/A 02/07/2015   RMR: Normal esophagus status post maloney dilation. Small hiatal hernia.   Marland Kitchen ESOPHAGOGASTRODUODENOSCOPY     unknown year per patient  . ESOPHAGOGASTRODUODENOSCOPY N/A 02/07/2015   RMR: Normal esophagus  status post  Maloney dilation. Small hiatal hernia  . FALSE ANEURYSM REPAIR Left 09/23/2017   Procedure: REPAIR FALSE ANEURYSM COMMON LEFT FEMORAL ARTERY WITH OSTEOTOMY OF BONE FRAGEMENT;  Surgeon: Angelia Mould, MD;  Location: [facility];  Service: Vascular;  Laterality: Left;  . FEMUR IM NAIL Left 06/11/2017   Procedure: INTRAMEDULLARY (IM) NAIL FEMORAL;  Surgeon: Rod Can, MD;  Location: [facility];  Service: Orthopedics;  Laterality: Left;  . NASAL SEPTUM SURGERY    . TONSILLECTOMY    . WRIST FRACTURE SURGERY  Current Meds  Medication Sig  . acetaminophen (TYLENOL) 650 MG CR tablet Take 650 mg by mouth every 8 (eight) hours as needed for pain.  . cholecalciferol (VITAMIN D) 1000 units tablet Take 1,000 Units by mouth 3 (three) times daily.  Marland Kitchen diltiazem (CARDIZEM) 60 MG tablet TAKE 1 TABLET BY MOUTH 3 TIMES A DAY  . DULoxetine (CYMBALTA) 60 MG capsule Take 60 mg by mouth 2 (two) times daily.   Marland Kitchen esomeprazole (NEXIUM) 20 MG capsule TAKE 1  CAPSULE BY MOUTH ONCE DAILY BEFORE BREAKFAST  . Fluticasone-Salmeterol (ADVAIR) 250-50 MCG/DOSE AEPB Inhale 1 puff into the lungs 2 (two) times daily.  . metoprolol tartrate (LOPRESSOR) 25 MG tablet TAKE 1 & 1/2 TABLETS BY MOUTH TWICE DAILY  . Rivaroxaban (XARELTO) 15 MG TABS tablet Take 1 tablet (15 mg total) by mouth daily with supper.  . THYROID PO Take 1 tablet by mouth daily. 60 mg daily  . vitamin B-12 (CYANOCOBALAMIN) 1000 MCG tablet Take 1,000 mcg by mouth 3 (three) times daily.      Family History  Problem Relation Age of Onset  . Heart disease Mother   . Stroke Mother   . Emphysema Father   . Colon polyps Neg Hx   . Colon cancer Neg Hx     Social History   Socioeconomic History  . Marital status: Married    Spouse name: Not on file  . Number of children: Not on file  . Years of education: Not on file  . Highest education level: Not on file  Occupational History  . Not on file  Social Needs  . Financial resource strain: Not on file  . Food insecurity    Worry: Not on file    Inability: Not on file  . Transportation needs    Medical: Not on file    Non-medical: Not on file  Tobacco Use  . Smoking status: Never Smoker  . Smokeless tobacco: Never Used  Substance and Sexual Activity  . Alcohol use: No    Alcohol/week: 0.0 standard drinks  . Drug use: No  . Sexual activity: Not Currently    Birth control/protection: Surgical    Comment: hyst  Lifestyle  . Physical activity    Days per week: Not on file    Minutes per session: Not on file  . Stress: Not on file  Relationships  . Social Herbalist on phone: Not on file    Gets together: Not on file    Attends religious service: Not on file    Active member of club or organization: Not on file    Attends meetings of clubs or organizations: Not on file    Relationship status: Not on file  Other Topics Concern  . Not on file  Social History Narrative  . Not on file       Review of Systems:  As in history of present illness.   Observations/Objective: No distress. Unable to perform physical exam due to telephone encounter. No video available.   Assessment and Plan: 85 year old lady with vague dysphagia/oropharyngeal and esophageal components.  She is managing at home.  She feels that sequestration because the pandemic is wearing on her mental and physical health  Alternating constipation and diarrhea.  Equal mixture of both as she reports ;  no overt GI bleeding history of occult blood positive stool.  Labs requested months ago have still not yet been done   Follow Up Instructions:  Face-to-face encounter here with  extender in 4 to 6 weeks from now  Lets arrange ferritin, IBC, serum iron and CBC to be done prior to next office visit.  The usual swallowing precautions were reviewed with Ms. diet today.  I wished her a happy belated 89th birthday.    I discussed the assessment and treatment plan with the patient. The patient was provided an opportunity to ask questions and all were answered. The patient agreed with the plan and demonstrated an understanding of the instructions.   The patient was advised to call back or seek an in-person evaluation if the symptoms worsen or if the condition fails to improve as anticipated.  I provided 16 minutes of non-face-to-face time during this encounter.  R Garfield Cornea, MD [facility]

## 2019-06-24 NOTE — Patient Instructions (Signed)
  Face-to-face encounter here with extender in 4 to 6 weeks from now  Lets arrange ferritin, IBC, serum iron and CBC to be done prior to next office visit.  The usual swallowing precautions were reviewed with Ms. diet today.  I wished her a happy belated 89th birthday.

## 2019-06-28 ENCOUNTER — Telehealth: Payer: Self-pay | Admitting: Internal Medicine

## 2019-06-28 ENCOUNTER — Telehealth: Payer: Self-pay | Admitting: Cardiology

## 2019-06-28 NOTE — Telephone Encounter (Signed)
Patient said she needs to have labwork done, is not sure weather this is scheduled or not  [phone]

## 2019-06-28 NOTE — Telephone Encounter (Signed)
Xarelto 15mg  samples left for patient.  She or daughter will pick up. [place] # [id number]   Exp 4/22

## 2019-06-28 NOTE — Telephone Encounter (Signed)
Patient calling the office for samples of medication: ° ° °1.  What medication and dosage are you requesting samples for? Xalreto ° °2.  Are you currently out of this medication?  ° ° °

## 2019-06-28 NOTE — Telephone Encounter (Signed)
Spoke with pt. She is aware that orders for her lab work are in the system for [employer]. Pt will have her labs drawn when she's able to get to the lab.

## 2019-07-13 ENCOUNTER — Other Ambulatory Visit: Payer: Self-pay | Admitting: Cardiology

## 2019-07-14 ENCOUNTER — Telehealth: Payer: Self-pay | Admitting: Internal Medicine

## 2019-07-14 NOTE — Telephone Encounter (Signed)
[phone] please call patient , she has questions about the labs she is supposed to have done

## 2019-07-14 NOTE — Telephone Encounter (Signed)
Spoke with pt about her lab orders. She asked for [facility] labs number because pt would prefer to make an appointment prior to having her lab work done. Number was provided for pt.

## 2019-07-19 LAB — IRON,TIBC AND FERRITIN PANEL
%SAT: 14 % (calc) — ABNORMAL LOW (ref 16–45)
Ferritin: 15 ng/mL — ABNORMAL LOW (ref 16–288)
Iron: 63 ug/dL (ref 45–160)
TIBC: 442 mcg/dL (calc) (ref 250–450)

## 2019-07-19 LAB — CBC WITH DIFFERENTIAL/PLATELET
Absolute Monocytes: 790 cells/uL (ref 200–950)
Basophils Absolute: 63 cells/uL (ref 0–200)
Basophils Relative: 0.8 %
Eosinophils Absolute: 490 cells/uL (ref 15–500)
Eosinophils Relative: 6.2 %
HCT: 42.2 % (ref 35.0–45.0)
Hemoglobin: 13.8 g/dL (ref 11.7–15.5)
Lymphs Abs: 3855 cells/uL (ref 850–3900)
MCH: 28.9 pg (ref 27.0–33.0)
MCHC: 32.7 g/dL (ref 32.0–36.0)
MCV: 88.3 fL (ref 80.0–100.0)
MPV: 10.8 fL (ref 7.5–12.5)
Monocytes Relative: 10 %
Neutro Abs: 2702 cells/uL (ref 1500–7800)
Neutrophils Relative %: 34.2 %
Platelets: 265 10*3/uL (ref 140–400)
RBC: 4.78 10*6/uL (ref 3.80–5.10)
RDW: 13.2 % (ref 11.0–15.0)
Total Lymphocyte: 48.8 %
WBC: 7.9 10*3/uL (ref 3.8–10.8)

## 2019-07-26 ENCOUNTER — Ambulatory Visit: Payer: Medicare HMO | Admitting: Gastroenterology

## 2019-07-28 ENCOUNTER — Telehealth: Payer: Self-pay | Admitting: Cardiology

## 2019-07-28 MED ORDER — RIVAROXABAN 15 MG PO TABS: 15.0000 mg | ORAL_TABLET | Freq: Every day | ORAL | 0 refills | Status: DC

## 2019-07-28 NOTE — Telephone Encounter (Signed)
Sara Garcia [phone]   Patient calling the office for samples of medication:   1.  What medication and dosage are you requesting samples for?Xarelto  2.  Are you currently out of this medication? Has about a week left

## 2019-07-28 NOTE — Telephone Encounter (Signed)
Pt son Audry Pili will come by office to pick up samples

## 2019-08-10 ENCOUNTER — Other Ambulatory Visit: Payer: Self-pay

## 2019-08-10 ENCOUNTER — Encounter: Payer: Self-pay | Admitting: Gastroenterology

## 2019-08-10 ENCOUNTER — Ambulatory Visit (INDEPENDENT_AMBULATORY_CARE_PROVIDER_SITE_OTHER): Payer: Medicare HMO | Admitting: Gastroenterology

## 2019-08-10 DIAGNOSIS — D500 Iron deficiency anemia secondary to blood loss (chronic): Secondary | ICD-10-CM | POA: Diagnosis not present

## 2019-08-10 DIAGNOSIS — R1314 Dysphagia, pharyngoesophageal phase: Secondary | ICD-10-CM | POA: Diagnosis not present

## 2019-08-10 MED ORDER — SITZ BATH MISC: 0 refills | Status: DC

## 2019-08-10 MED ORDER — FUSION PLUS PO CAPS: 1.0000 | ORAL_CAPSULE | Freq: Every day | ORAL | 1 refills | Status: DC

## 2019-08-10 NOTE — Patient Instructions (Signed)
1. We have sent in prescription for Fusion Plus to take one daily for low iron. 2. I have sent in prescription for a [employer].  3. We need to recheck your iron levels in 2 months.  4. Return office visit in 3 months. Call sooner if needed.

## 2019-08-10 NOTE — Progress Notes (Signed)
Primary Care Physician:  Doree Albee, MD Primary GI:  Garfield Cornea, MD    Patient Location: Home  Provider Location: [facility] office  Reason for Phone Visit: f/u swallowing and anemia  Persons present on the phone encounter, with roles: Patient, myself (provider),Mindy Quincy Simmonds, CMA (updated meds and allergies)  Total time (minutes) spent on medical discussion: 39 minutes  Due to COVID-19, visit was conducted using telephonic method (no video was available).  Visit was requested by patient.  Virtual Visit via Telephone only  I connected with Sara Garcia on 08/10/19 at 10:30 AM EDT by telephone and verified that I am speaking with the correct person using two identifiers.   I discussed the limitations, risks, security and privacy concerns of performing an evaluation and management service by telephone and the availability of in person appointments. I also discussed with the patient that there may be a patient responsible charge related to this service. The patient expressed understanding and agreed to proceed.  Chief Complaint  Patient presents with  . Anemia    f/u.  Marland Kitchen Dysphagia    f/u. Problems swallowing liquids and solids. "up and down thing"    HPI:   Patient is a pleasant 85 y/o female who presents for telephone visit regarding dysphagia and anemia. She discussed wanted face to face encounter at her last telephone visit in 06/2019 but she said she preferred telephone visit due to [place].   She has h/o heme + stool noted in 10/2018. Has declined EGD/TCS both her and her daughter several times. Finally got her labs done this month. Her H/H are normal but her iron sat and ferritin slightly low.   Overall she feels well. She has alternating constipation/diarrhea. More constipated but when finally has BM, then quickly goes to loose stool. Recently add fiber laxative made of sennakot. BMs daily now. No melena, brbpr.   She called her gyn in 05/2019 to see if her IDA could be due  to gyn source. She was instructed that this was unlikely. Now she is having some vaginal bleeding in the setting of pessary. Encouraged her to call gyn again.   Has had several strokes.  Has a hard time making decisions. Afraid to do anything do to the pandemic.   Not concerned about her chronic swallowing issues. She is trying to follow ST recommendations. She knows it's as good as it gets.   Patient again states she is not interested in a EGD or TCS.    Current Outpatient Medications  Medication Sig Dispense Refill  . acetaminophen (TYLENOL) 650 MG CR tablet Take 650 mg by mouth every 8 (eight) hours as needed for pain.    . cholecalciferol (VITAMIN D) 1000 units tablet Take 1,000 Units by mouth 3 (three) times daily.    Marland Kitchen diltiazem (CARDIZEM) 60 MG tablet TAKE 1 TABLET BY MOUTH 3 TIMES A DAY 270 tablet 2  . DULoxetine (CYMBALTA) 60 MG capsule Take 60 mg by mouth 2 (two) times daily.     Marland Kitchen esomeprazole (NEXIUM) 20 MG capsule TAKE 1 CAPSULE BY MOUTH ONCE DAILY BEFORE BREAKFAST 30 capsule 10  . Fluticasone-Salmeterol (ADVAIR) 250-50 MCG/DOSE AEPB Inhale 1 puff into the lungs 2 (two) times daily.    . metoprolol tartrate (LOPRESSOR) 25 MG tablet TAKE 1 AND 1/2 TABLETS BY MOUTH TWICE DAILY 270 tablet 1  . Rivaroxaban (XARELTO) 15 MG TABS tablet Take 1 tablet (15 mg total) by mouth daily with supper. 21 tablet 0  .  THYROID PO Take 1 tablet by mouth daily. 60 mg daily    . vitamin B-12 (CYANOCOBALAMIN) 1000 MCG tablet Take 1,000 mcg by mouth 3 (three) times daily.      No current facility-administered medications for this visit.     Past Medical History:  Diagnosis Date  . 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 Arthritis   . Asthma   . DVT (deep venous thrombosis) ([place]) 2011/2012  . Esophageal dysphagia    limited food and pill  . Esophageal reflux   . Essential hypertension, benign   . GERD (gastroesophageal reflux disease)   . Osteoporosis   . Overactive bladder   . Paroxysmal atrial  fibrillation (H[facility])    a. on Xarelto for anticoagulation  . 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 Stroke ([place])   . Unspecified prolapse of vaginal walls     Past Surgical History:  Procedure Laterality Date  . ABDOMINAL HYSTERECTOMY    . COLONOSCOPY W/ BIOPSIES  02/01/2004   RMR: Anal papilla with normal rectum/Sigmoid diverticula/Small polyps in the cecum removed as described above. Inflammatory polyp  . ESOPHAGEAL DILATION     unknown year per patient.  . ESOPHAGEAL DILATION N/A 02/07/2015   RMR: Normal esophagus status post maloney dilation. Small hiatal hernia.   Marland Kitchen ESOPHAGOGASTRODUODENOSCOPY     unknown year per patient  . ESOPHAGOGASTRODUODENOSCOPY N/A 02/07/2015   RMR: Normal esophagus  status post  Maloney dilation. Small hiatal hernia  . FALSE ANEURYSM REPAIR Left 09/23/2017   Procedure: REPAIR FALSE ANEURYSM COMMON LEFT FEMORAL ARTERY WITH OSTEOTOMY OF BONE FRAGEMENT;  Surgeon: Angelia Mould, MD;  Location: [place];  Service: Vascular;  Laterality: Left;  . FEMUR IM NAIL Left 06/11/2017   Procedure: INTRAMEDULLARY (IM) NAIL FEMORAL;  Surgeon: Rod Can, MD;  Location[facility];  Service: Orthopedics;  Laterality: Left;  . NASAL SEPTUM SURGERY    . TONSILLECTOMY    . WRIST FRACTURE SURGERY      Family History  Problem Relation Age of Onset  . Heart disease Mother   . Stroke Mother   . Emphysema Father   . Colon polyps Neg Hx   . Colon cancer Neg Hx     Social History   Socioeconomic History  . Marital status: Married    Spouse name: Not on file  . Number of children: Not on file  . Years of education: Not on file  . Highest education level: Not on file  Occupational History  . Not on file  Social Needs  . Financial resource strain: Not on file  . Food insecurity    Worry: Not on file    Inability: Not on file  . Transportation needs    Medical: Not on file    Non-medical: Not on file  Tobacco Use  . Smoking status: Never Smoker  . Smokeless tobacco:  Never Used  Substance and Sexual Activity  . Alcohol use: No    Alcohol/week: 0.0 standard drinks  . Drug use: No  . Sexual activity: Not Currently    Birth control/protection: Surgical    Comment: hyst  Lifestyle  . Physical activity    Days per week: Not on file    Minutes per session: Not on file  . Stress: Not on file  Relationships  . Social Herbalist on phone: Not on file    Gets together: Not on file    Attends religious service: Not on file    Active member  of club or organization: Not on file    Attends meetings of clubs or organizations: Not on file    Relationship status: Not on file  . Intimate partner violence    Fear of current or ex partner: Not on file    Emotionally abused: Not on file    Physically abused: Not on file    Forced sexual activity: Not on file  Other Topics Concern  . Not on file  Social History Narrative  . Not on file      ROS:  General: Negative for anorexia, weight loss, fever, chills, fatigue, weakness. Eyes: Negative for vision changes.  ENT: Negative for hoarseness,  nasal congestion.see hpi CV: Negative for chest pain, angina, palpitations, dyspnea on exertion, peripheral edema.  Respiratory: Negative for dyspnea at rest, dyspnea on exertion, cough, sputum, wheezing.  GI: See history of present illness. GU:  Negative for dysuria, hematuria, urinary incontinence, urinary frequency, nocturnal urination.  MS: Negative for joint pain, low back pain.  Derm: Negative for rash or itching.  Neuro: Negative for weakness, abnormal sensation, seizure, frequent headaches, memory loss, confusion.  Psych: Negative for anxiety, depression, suicidal ideation, hallucinations.  Endo: Negative for unusual weight change.  Heme: Negative for bruising or bleeding. Allergy: Negative for rash or hives.   Observations/Objective:  Pleasant female in NAD. No SOB with conversation. Difficult historian. Spoke for 35 minutes almost non-stop but  coherent speech/thought processes.   Otherwise exam unavailable over the phone.     Lab Results  Component Value Date   WBC 7.9 07/19/2019   HGB 13.8 07/19/2019   HCT 42.2 07/19/2019   MCV 88.3 07/19/2019   PLT 265 07/19/2019   Lab Results  Component Value Date   CREATININE 0.70 10/17/2017   BUN 24 (H) 10/17/2017   NA 139 10/17/2017   K 3.3 (L) 10/17/2017   CL 106 10/17/2017   CO2 22 10/17/2017   Lab Results  Component Value Date   IRON 63 07/19/2019   TIBC 442 07/19/2019   FERRITIN 15 (L) 07/19/2019    Assessment and Plan: Pleasant 85 y/o female with vague dysphagia/oropharyngeal dysphagia, IDA/heme positive stool presenting for telephone follow-up.  Recent labs showed normal hemoglobin but slightly low ferritin and iron saturations.  She has been reluctant to taking oral iron because of concern for constipation.  Finally has her bowel movements regulated with Senokot.  She is satisfied with her dysphagia work-up.  She realizes there is nothing additional that can be done.  She is trying to concentrate on following speech therapy recommendations and being very careful with eating.  With regards to IDA and heme positive stool, she is not interested in pursuing EGD or colonoscopy at this time.  She is willing to try iron supplements, called in Fusion plus to take once daily, likely better tolerated from a GI standpoint.  Plan to recheck her labs in 8 weeks.  We will have her come to the office in 3 months for follow-up or call sooner if needed.  Patient requested prescription for sitz bath to assist with management of hemorrhoids and hygiene.  She is unable to get down in the bathtub.  Encouraged her to call her GYN regarding vaginal bleeding.   Follow Up Instructions:    I discussed the assessment and treatment plan with the patient. The patient was provided an opportunity to ask questions and all were answered. The patient agreed with the plan and demonstrated an  understanding of the instructions. AVS mailed to  patient's home address.   The patient was advised to call back or seek an in-person evaluation if the symptoms worsen or if the condition fails to improve as anticipated.  I provided 39 minutes of non-face-to-face time during this encounter.   Neil Crouch, PA-C

## 2019-08-17 ENCOUNTER — Other Ambulatory Visit: Payer: Self-pay

## 2019-08-17 DIAGNOSIS — D509 Iron deficiency anemia, unspecified: Secondary | ICD-10-CM

## 2019-08-17 NOTE — Progress Notes (Signed)
cbc

## 2019-08-19 ENCOUNTER — Telehealth: Payer: Self-pay | Admitting: Cardiology

## 2019-08-19 MED ORDER — RIVAROXABAN 15 MG PO TABS: 15.0000 mg | ORAL_TABLET | Freq: Every day | ORAL | 0 refills | Status: DC

## 2019-08-19 NOTE — Telephone Encounter (Signed)
Patient calling the office for samples of medication:   1.  What medication and dosage are you requesting samples for?  (XARELTO) 15 MG TABS    2.  Are you currently out of this medication?

## 2019-08-19 NOTE — Telephone Encounter (Signed)
Sara Garcia notified samples ready for pick up.  Informed him to call the office when he arrives to parking lot and someone will bring out to him.  He verbalized understanding.

## 2019-08-23 ENCOUNTER — Telehealth (INDEPENDENT_AMBULATORY_CARE_PROVIDER_SITE_OTHER): Payer: Self-pay

## 2019-08-23 NOTE — Telephone Encounter (Signed)
She will need to be seen in the office tomorrow, if she thinks this is more urgent, she needs to go to the emergency room.

## 2019-08-23 NOTE — Telephone Encounter (Signed)
Pt is called and gave Tues 10:40 am. But she has to see who can buy this.

## 2019-08-24 ENCOUNTER — Other Ambulatory Visit: Payer: Self-pay

## 2019-08-24 ENCOUNTER — Encounter (INDEPENDENT_AMBULATORY_CARE_PROVIDER_SITE_OTHER): Payer: Self-pay | Admitting: Internal Medicine

## 2019-08-24 ENCOUNTER — Ambulatory Visit (INDEPENDENT_AMBULATORY_CARE_PROVIDER_SITE_OTHER): Payer: Medicare HMO | Admitting: Internal Medicine

## 2019-08-24 VITALS — BP 118/58 | HR 64 | Ht 62.0 in | Wt 141.0 lb

## 2019-08-24 DIAGNOSIS — M7989 Other specified soft tissue disorders: Secondary | ICD-10-CM | POA: Diagnosis not present

## 2019-08-24 NOTE — Progress Notes (Addendum)
Metrics: Intervention Frequency ACO  Documented Smoking Status Yearly  Screened one or more times in 24 months  Cessation Counseling or  Active cessation medication Past 24 months  Past 24 months   Guideline developer: UpToDate (See UpToDate for funding source) Date Released: 2014       Wellness Office Visit  Subjective:  Patient ID: Sara Garcia, female    DOB: [birth year]  Age: 85 y.o. MRN: [id number]  CC: Leg swelling HPI  This lady comes in for an acute visit because she is concerned of bilateral leg swelling.  On closer questioning, it appears that she has had this intermittently for several months and this is not really something that transpired in the last day or 2.  She denies any increased dyspnea, chest pain, hemoptysis.  She does have a history of DVT in the past and is on anticoagulation therapy largely because of her paroxysmal atrial fibrillation. Past Medical History:  Diagnosis Date  . 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 Arthritis   . Asthma   . DVT (deep venous thrombosis) ([place]) 2011/2012  . Esophageal dysphagia    limited food and pill  . Esophageal reflux   . Essential hypertension, benign   . GERD (gastroesophageal reflux disease)   . Osteoporosis   . Overactive bladder   . Paroxysmal atrial fibrillation ([facility])    a. on Xarelto for anticoagulation  . 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 Stroke ([place])   . Unspecified prolapse of vaginal walls       Family History  Problem Relation Age of Onset  . Heart disease Mother   . Stroke Mother   . Emphysema Father   . Colon polyps Neg Hx   . Colon cancer Neg Hx     Social History   Social History Narrative   Married for 21 years in second marriage.First marriage lasted 81 years.Lives with husband.   Social History   Tobacco Use  . Smoking status: Never Smoker  . Smokeless tobacco: Never Used  Substance Use Topics  . Alcohol use: No    Alcohol/week: 0.0 standard drinks    Current Meds  Medication Sig  .  acetaminophen (TYLENOL) 650 MG CR tablet Take 650 mg by mouth every 8 (eight) hours as needed for pain.  . cholecalciferol (VITAMIN D) 1000 units tablet Take 1,000 Units by mouth 3 (three) times daily.  Marland Kitchen diltiazem (CARDIZEM) 60 MG tablet TAKE 1 TABLET BY MOUTH 3 TIMES A DAY  . DULoxetine (CYMBALTA) 60 MG capsule Take 60 mg by mouth 2 (two) times daily.   Marland Kitchen esomeprazole (NEXIUM) 20 MG capsule TAKE 1 CAPSULE BY MOUTH ONCE DAILY BEFORE BREAKFAST  . Fluticasone-Salmeterol (ADVAIR) 250-50 MCG/DOSE AEPB Inhale 1 puff into the lungs 2 (two) times daily.  . Iron-FA-B Cmp-C-Biot-Probiotic (FUSION PLUS) CAPS Take 1 capsule by mouth daily.  . metoprolol tartrate (LOPRESSOR) 25 MG tablet TAKE 1 AND 1/2 TABLETS BY MOUTH TWICE DAILY  . NP THYROID 60 MG tablet Take 60 mg by mouth daily before breakfast.  . Rivaroxaban (XARELTO) 15 MG TABS tablet Take 1 tablet (15 mg total) by mouth daily with supper.  . vitamin B-12 (CYANOCOBALAMIN) 1000 MCG tablet Take 1,000 mcg by mouth daily.      Objective:   Today's Vitals: BP (!) 118/58   Pulse 64   Ht 5' 2"  (1.575 m)   Wt 141 lb (64 kg)   BMI 25.79 kg/m  Vitals with BMI 08/24/2019 06/24/2019 12/14/2018  Height 5' 2"  -  5' 0"   Weight 141 lbs - 152 lbs 8 oz  BMI 71.27 - 87.18  Systolic 367 (No Data) 255  Diastolic 58 (No Data) 72  Pulse 64 - 68     Physical Exam   She does have bilateral lower leg swelling with pitting edema and this appears to be symmetrical.  Lung fields are clear with no evidence of pleural rub.  There are no crackles, bronchial breathing or wheezing.  She does not really have clinical evidence of heart failure either.  She is alert and orientated.    Assessment   1. Leg swelling       Tests ordered Orders Placed This Encounter  Procedures  . CMP with eGFR([facility])  . D-dimer, Quantitative     Plan: 1. My impression is that this is either dependent edema which is most likely what it is and I do not think this is  representative of congestive heart failure.  It is possible that she has bilateral DVT although this is more unlikely.  Blood work is ordered above including D-dimer to see if we need to further evaluate both her legs for the possibility of DVT. 2. Further recommendations will depend on these results.  She was given high-dose influenza vaccination today.   No orders of the defined types were placed in this encounter.   Doree Albee, MD

## 2019-08-25 ENCOUNTER — Other Ambulatory Visit (INDEPENDENT_AMBULATORY_CARE_PROVIDER_SITE_OTHER): Payer: Self-pay | Admitting: Internal Medicine

## 2019-08-25 DIAGNOSIS — M7989 Other specified soft tissue disorders: Secondary | ICD-10-CM

## 2019-08-25 LAB — COMPLETE METABOLIC PANEL WITH GFR
AG Ratio: 1.3 (calc) (ref 1.0–2.5)
ALT: 22 U/L (ref 6–29)
AST: 21 U/L (ref 10–35)
Albumin: 3.9 g/dL (ref 3.6–5.1)
Alkaline phosphatase (APISO): 89 U/L (ref 37–153)
BUN: 24 mg/dL (ref 7–25)
CO2: 22 mmol/L (ref 20–32)
Calcium: 9.4 mg/dL (ref 8.6–10.4)
Chloride: 107 mmol/L (ref 98–110)
Creat: 0.88 mg/dL (ref 0.60–0.88)
GFR, Est African American: 68 mL/min/{1.73_m2} (ref >=60)
GFR, Est Non African American: 58 mL/min/{1.73_m2} — ABNORMAL LOW (ref >=60)
Globulin: 3 g/dL (calc) (ref 1.9–3.7)
Glucose, Bld: 106 mg/dL — ABNORMAL HIGH (ref 65–99)
Potassium: 4 mmol/L (ref 3.5–5.3)
Sodium: 142 mmol/L (ref 135–146)
Total Bilirubin: 0.6 mg/dL (ref 0.2–1.2)
Total Protein: 6.9 g/dL (ref 6.1–8.1)

## 2019-08-25 LAB — D-DIMER, QUANTITATIVE: D-Dimer, Quant: 0.75 mcg/mL FEU — ABNORMAL HIGH (ref <0.50)

## 2019-08-25 NOTE — Progress Notes (Signed)
Return call to pt home phone. Pt was notified of results. She will see who can carry or co bus will pickup.

## 2019-08-31 ENCOUNTER — Telehealth (INDEPENDENT_AMBULATORY_CARE_PROVIDER_SITE_OTHER): Payer: Self-pay

## 2019-09-01 ENCOUNTER — Other Ambulatory Visit (INDEPENDENT_AMBULATORY_CARE_PROVIDER_SITE_OTHER): Payer: Self-pay | Admitting: Internal Medicine

## 2019-09-01 ENCOUNTER — Other Ambulatory Visit (INDEPENDENT_AMBULATORY_CARE_PROVIDER_SITE_OTHER): Payer: Self-pay

## 2019-09-01 ENCOUNTER — Ambulatory Visit (HOSPITAL_COMMUNITY)
Admission: RE | Admit: 2019-09-01 | Discharge: 2019-09-01 | Disposition: A | Discharge status: Home / Self Care | Payer: Medicare HMO | Source: Ambulatory Visit | Attending: Internal Medicine | Admitting: Internal Medicine

## 2019-09-01 ENCOUNTER — Other Ambulatory Visit: Payer: Self-pay

## 2019-09-01 DIAGNOSIS — M7989 Other specified soft tissue disorders: Secondary | ICD-10-CM | POA: Insufficient documentation

## 2019-09-01 MED ORDER — FUROSEMIDE 40 MG PO TABS: 40.0000 mg | ORAL_TABLET | Freq: Every day | ORAL | 3 refills | Status: DC

## 2019-09-01 NOTE — Telephone Encounter (Signed)
-----   Message from Doree Albee, MD sent at 09/01/2019 12:00 PM EST ----- Please call patient and tell her that there is no evidence of deep vein thrombosis.

## 2019-09-01 NOTE — Progress Notes (Signed)
PT IS WAITING WITH SON TO HAVE TESTING AT [facility]  NOW.

## 2019-09-01 NOTE — Telephone Encounter (Signed)
Pt just completed the [place] LE ; son called a wanting to know now it shows no blood clot. What should he do next for her? She was hurting all night last night. She didn't sleep well.

## 2019-09-01 NOTE — Telephone Encounter (Signed)
Called son, Gearldine Shown. He was given instructions to pick-up Lasix 40 mg Rx at [facility]. To take as directed . Pt is to also make sure she is to come to appt on 11/24 With [facility], to f/u on imaging & medications given for BLE swelling.

## 2019-09-06 ENCOUNTER — Other Ambulatory Visit (INDEPENDENT_AMBULATORY_CARE_PROVIDER_SITE_OTHER): Payer: Self-pay | Admitting: Internal Medicine

## 2019-09-06 NOTE — Progress Notes (Signed)
Prt was called and given information via home phone. Pt will be in tomorrow for f/u with Sarah.

## 2019-09-07 ENCOUNTER — Other Ambulatory Visit (INDEPENDENT_AMBULATORY_CARE_PROVIDER_SITE_OTHER): Payer: Self-pay | Admitting: Internal Medicine

## 2019-09-07 ENCOUNTER — Encounter (INDEPENDENT_AMBULATORY_CARE_PROVIDER_SITE_OTHER): Payer: Self-pay | Admitting: Nurse Practitioner

## 2019-09-07 ENCOUNTER — Ambulatory Visit (INDEPENDENT_AMBULATORY_CARE_PROVIDER_SITE_OTHER): Payer: Medicare HMO | Admitting: Nurse Practitioner

## 2019-09-07 DIAGNOSIS — Z0000 Encounter for general adult medical examination without abnormal findings: Secondary | ICD-10-CM

## 2019-09-07 NOTE — Progress Notes (Signed)
Due to national recommendations of social distancing related to the [place] pandemic, an audio/visual tele-health visit was felt to be the most appropriate encounter type for this patient today. I connected with  Sara Garcia on 09/07/19 utilizing audio-only technology and verified that I am speaking with the correct person using two identifiers.  Unfortunately she did not have access to technology allowing her to complete visit using video.  Thus visit was conducted using telephone only.  The patient was located at their home, and I was located at the office of [facility] during the encounter. I discussed the limitations of evaluation and management by telemedicine. The patient expressed understanding and agreed to proceed.    Subjective:   Sara Garcia is a 85 y.o. female who presents for Medicare Annual (Subsequent) preventive examination.   Objective:     Vitals: There were no vitals taken for this visit.  There is no height or weight on file to calculate BMI.  Vital signs are not collected today as office visit was conducted remotely.  Patient did not have vital sign machine available to her.  Same goes for weight.  Advanced Directives 09/07/2019 02/11/2018 02/11/2018 02/04/2018 10/17/2017 09/18/2017 07/21/2017  Does Patient Have a Medical Advance Directive? Yes Yes Yes Yes No No No  Type of Advance Directive Out of facility DNR (pink MOST or yellow form) Out of facility DNR (pink MOST or yellow form) Out of facility DNR (pink MOST or yellow form) Out of facility DNR (pink MOST or yellow form) - - -  Does patient want to make changes to medical advance directive? No - Patient declined - - - - - -  Would patient like information on creating a medical advance directive? - - - - - No - Patient declined No - Patient declined  Pre-existing out of facility DNR order (yellow form or pink MOST form) - - - (No Data) - - -    Tobacco Social History   Tobacco Use  Smoking Status Never Smoker   Smokeless Tobacco Never Used     Counseling given: Not Answered   Clinical Intake:  Pre-visit preparation completed: Yes  Pain : No/denies pain     BMI - recorded: (Not comleted today as patient is not able to weigh self at home) Nutritional Risks: None Diabetes: No  How often do you need to have someone help you when you read instructions, pamphlets, or other written materials from your doctor or pharmacy?: 2 - Rarely What is the last grade level you completed in school?: Some college  Interpreter Needed?: No     Past Medical History:  Diagnosis Date  . 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 Arthritis   . Asthma   . DVT (deep venous thrombosis) ([place]) 2011/2012  . Esophageal dysphagia    limited food and pill  . Esophageal reflux   . Essential hypertension, benign   . GERD (gastroesophageal reflux disease)   . Osteoporosis   . Overactive bladder   . Paroxysmal atrial fibrillation (H[facility])    a. on Xarelto for anticoagulation  . 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 Stroke ([place])   . Unspecified prolapse of vaginal walls    Past Surgical History:  Procedure Laterality Date  . ABDOMINAL HYSTERECTOMY    . COLONOSCOPY W/ BIOPSIES  02/01/2004   RMR: Anal papilla with normal rectum/Sigmoid diverticula/Small polyps in the cecum removed as described above. Inflammatory polyp  . ESOPHAGEAL DILATION     unknown year  per patient.  . ESOPHAGEAL DILATION N/A 02/07/2015   RMR: Normal esophagus status post maloney dilation. Small hiatal hernia.   Marland Kitchen ESOPHAGOGASTRODUODENOSCOPY     unknown year per patient  . ESOPHAGOGASTRODUODENOSCOPY N/A 02/07/2015   RMR: Normal esophagus  status post  Maloney dilation. Small hiatal hernia  . FALSE ANEURYSM REPAIR Left 09/23/2017   Procedure: REPAIR FALSE ANEURYSM COMMON LEFT FEMORAL ARTERY WITH OSTEOTOMY OF BONE FRAGEMENT;  Surgeon: Angelia Mould, MD;  Location: [facility];  Service: Vascular;  Laterality: Left;  . FEMUR IM NAIL Left 06/11/2017   Procedure:  INTRAMEDULLARY (IM) NAIL FEMORAL;  Surgeon: Rod Can, MD;  Location: [facility];  Service: Orthopedics;  Laterality: Left;  . LEG SURGERY Left   . NASAL SEPTUM SURGERY    . TONSILLECTOMY    . WRIST FRACTURE SURGERY     Family History  Problem Relation Age of Onset  . Heart disease Mother   . Stroke Mother   . Emphysema Father   . COPD Sister   . Liver disease Brother   . Colon polyps Neg Hx   . Colon cancer Neg Hx    Social History   Socioeconomic History  . Marital status: Married    Spouse name: Jeneen Rinks  . Number of children: 3  . Years of education: 12+  . Highest education level: Some college, no degree  Occupational History  . Not on file  Social Needs  . Financial resource strain: Not on file  . Food insecurity    Worry: Not on file    Inability: Not on file  . Transportation needs    Medical: Not on file    Non-medical: Not on file  Tobacco Use  . Smoking status: Never Smoker  . Smokeless tobacco: Never Used  Substance and Sexual Activity  . Alcohol use: No    Alcohol/week: 0.0 standard drinks  . Drug use: No  . Sexual activity: Not Currently    Birth control/protection: Surgical    Comment: hysterectomy  Lifestyle  . Physical activity    Days per week: Not on file    Minutes per session: Not on file  . Stress: Not on file  Relationships  . Social Herbalist on phone: Not on file    Gets together: Not on file    Attends religious service: Not on file    Active member of club or organization: Not on file    Attends meetings of clubs or organizations: Not on file    Relationship status: Not on file  Other Topics Concern  . Not on file  Social History Narrative   Married for 21 years in second marriage.First marriage lasted 75 years.Lives with husband.    Outpatient Encounter Medications as of 09/07/2019  Medication Sig  . acetaminophen (TYLENOL) 650 MG CR tablet Take 650 mg by mouth every 8 (eight) hours as needed for pain.  .  cholecalciferol (VITAMIN D) 1000 units tablet Take 1,000 Units by mouth 3 (three) times daily.  Marland Kitchen diltiazem (CARDIZEM) 60 MG tablet TAKE 1 TABLET BY MOUTH 3 TIMES A DAY  . DULoxetine (CYMBALTA) 60 MG capsule Take 60 mg by mouth 2 (two) times daily.   Marland Kitchen esomeprazole (NEXIUM) 20 MG capsule TAKE 1 CAPSULE BY MOUTH ONCE DAILY BEFORE BREAKFAST  . Fluticasone-Salmeterol (ADVAIR) 250-50 MCG/DOSE AEPB Inhale 1 puff into the lungs daily.   . furosemide (LASIX) 40 MG tablet Take 1 tablet (40 mg total) by mouth daily.  . Iron-FA-B  Cmp-C-Biot-Probiotic (FUSION PLUS) CAPS Take 1 capsule by mouth daily.  . metoprolol tartrate (LOPRESSOR) 25 MG tablet TAKE 1 AND 1/2 TABLETS BY MOUTH TWICE DAILY  . NP THYROID 60 MG tablet Take 60 mg by mouth daily before breakfast.  . Rivaroxaban (XARELTO) 15 MG TABS tablet Take 1 tablet (15 mg total) by mouth daily with supper.  . vitamin B-12 (CYANOCOBALAMIN) 1000 MCG tablet Take 1,000 mcg by mouth daily.   . [DISCONTINUED] Rivaroxaban (XARELTO) 15 MG TABS tablet Take 1 tablet (15 mg total) by mouth daily with supper.  . [DISCONTINUED] THYROID PO Take 1 tablet by mouth daily. 60 mg daily   No facility-administered encounter medications on file as of 09/07/2019.     Activities of Daily Living In your present state of health, do you have any difficulty performing the following activities: 09/07/2019  Hearing? Y  Vision? Y  Difficulty concentrating or making decisions? Y  Walking or climbing stairs? Y  Dressing or bathing? Y  Doing errands, shopping? Y  Some recent data might be hidden    Patient Care Team: Doree Albee, MD as PCP - General (Internal Medicine) Harl Bowie Alphonse Guild, MD as PCP - Cardiology (Cardiology) Gala Romney Cristopher Estimable, MD as Consulting Physician (Gastroenterology) Harl Bowie Alphonse Guild, MD as Consulting Physician (Cardiology) Rod Can, MD as Consulting Physician (Orthopedic Surgery)    Assessment:   This is a routine wellness examination for  [place].  Exercise Activities and Dietary recommendations    Goals   None     Fall Risk Fall Risk  09/07/2019 01/22/2018  Falls in the past year? 0 Yes  Risk for fall due to : History of fall(s);Impaired mobility;Impaired vision Impaired balance/gait;Impaired mobility   Is the patient's home free of loose throw rugs in walkways, pet beds, electrical cords, etc?   no      Grab bars in the bathroom? yes      Handrails on the stairs?   no      Adequate lighting?   yes  Timed Get Up and Go performed: Not performed as office visit was completed virtually  Depression Screen PHQ 2/9 Scores 09/07/2019  PHQ - 2 Score 6  PHQ- 9 Score 13     Cognitive Function     6CIT Screen 09/07/2019  What Year? 0 points  What month? 0 points  What time? 0 points  Count back from 20 0 points  Months in reverse 4 points  Repeat phrase 2 points  Total Score 6    Immunization History  Administered Date(s) Administered  . Influenza, High Dose Seasonal PF 07/22/2017  . Influenza-Unspecified 08/24/2019   Per history, it is documented that patient received influenza shot last on 08/24/2019  Screening Tests Health Maintenance  Topic Date Due  . TETANUS/TDAP  09/06/2020 (Originally 06/13/1947)  . PNA vac Low Risk Adult (2 of 2 - PPSV23) 09/16/2019  . INFLUENZA VACCINE  Completed  . DEXA SCAN  Completed    Cancer Screenings: Lung: Low Dose CT Chest recommended if Age 22-80 years, 30 pack-year currently smoking OR have quit w/in 15years. Patient does not qualify. Breast:  Up to date on Mammogram? Yes   Patient has aged out of this reccomendation Up to date of Bone Density/Dexa? No -- patient is known to have osteoporosis. She is not willing to undergo additional scan at this time due to concerns of exposure to COVID19 Colorectal: patient has aged out of this recommendation  Additional Screenings: Hepatitis C Screening: Patient has aged  out of this reccomendation     Plan:   Patient is  currently up-to-date with all screenings and health maintenance recommendations for a female of her age that she is willing to undergo.  She will follow-up as scheduled in 3 months, and will be due for her annual wellness visit again in 1 year.  She was encouraged to call this office with any questions or concerns prior to her next upcoming appointment.  I have personally reviewed and noted the following in the patient's chart:   . Medical and social history . Use of alcohol, tobacco or illicit drugs  . Current medications and supplements . Functional ability and status . Nutritional status . Physical activity . Advanced directives . List of other physicians . Hospitalizations, surgeries, and ER visits in previous 12 months . Vitals . Screenings to include cognitive, depression, and falls . Referrals and appointments  In addition, I have reviewed and discussed with patient certain preventive protocols, quality metrics, and best practice recommendations. A written personalized care plan for preventive services as well as general preventive health recommendations were provided to patient.     Ailene Ards, NP  09/07/2019

## 2019-09-07 NOTE — Patient Instructions (Signed)
 Fall Prevention in the Home, Adult Falls can cause injuries. They can happen to people of all ages. There are many things you can do to make your home safe and to help prevent falls. Ask for help when making these changes, if needed. What actions can I take to prevent falls? General Instructions  Use good lighting in all rooms. Replace any light bulbs that burn out.  Turn on the lights when you go into a dark area. Use night-lights.  Keep items that you use often in easy-to-reach places. Lower the shelves around your home if necessary.  Set up your furniture so you have a clear path. Avoid moving your furniture around.  Do not have throw rugs and other things on the floor that can make you trip.  Avoid walking on wet floors.  If any of your floors are uneven, fix them.  Add color or contrast paint or tape to clearly mark and help you see: ? Any grab bars or handrails. ? First and last steps of stairways. ? Where the edge of each step is.  If you use a stepladder: ? Make sure that it is fully opened. Do not climb a closed stepladder. ? Make sure that both sides of the stepladder are locked into place. ? Ask someone to hold the stepladder for you while you use it.  If there are any pets around you, be aware of where they are. What can I do in the bathroom?      Keep the floor dry. Clean up any water that spills onto the floor as soon as it happens.  Remove soap buildup in the tub or shower regularly.  Use non-skid mats or decals on the floor of the tub or shower.  Attach bath mats securely with double-sided, non-slip rug tape.  If you need to sit down in the shower, use a plastic, non-slip stool.  Install grab bars by the toilet and in the tub and shower. Do not use towel bars as grab bars. What can I do in the bedroom?  Make sure that you have a light by your bed that is easy to reach.  Do not use any sheets or blankets that are too big for your bed. They should  not hang down onto the floor.  Have a firm chair that has side arms. You can use this for support while you get dressed. What can I do in the kitchen?  Clean up any spills right away.  If you need to reach something above you, use a strong step stool that has a grab bar.  Keep electrical cords out of the way.  Do not use floor polish or wax that makes floors slippery. If you must use wax, use non-skid floor wax. What can I do with my stairs?  Do not leave any items on the stairs.  Make sure that you have a light switch at the top of the stairs and the bottom of the stairs. If you do not have them, ask someone to add them for you.  Make sure that there are handrails on both sides of the stairs, and use them. Fix handrails that are broken or loose. Make sure that handrails are as long as the stairways.  Install non-slip stair treads on all stairs in your home.  Avoid having throw rugs at the top or bottom of the stairs. If you do have throw rugs, attach them to the floor with carpet tape.  Choose a carpet that   does not hide the edge of the steps on the stairway.  Check any carpeting to make sure that it is firmly attached to the stairs. Fix any carpet that is loose or worn. What can I do on the outside of my home?  Use bright outdoor lighting.  Regularly fix the edges of walkways and driveways and fix any cracks.  Remove anything that might make you trip as you walk through a door, such as a raised step or threshold.  Trim any bushes or trees on the path to your home.  Regularly check to see if handrails are loose or broken. Make sure that both sides of any steps have handrails.  Install guardrails along the edges of any raised decks and porches.  Clear walking paths of anything that might make someone trip, such as tools or rocks.  Have any leaves, snow, or ice cleared regularly.  Use sand or salt on walking paths during winter.  Clean up any spills in your garage right  away. This includes grease or oil spills. What other actions can I take?  Wear shoes that: ? Have a low heel. Do not wear high heels. ? Have rubber bottoms. ? Are comfortable and fit you well. ? Are closed at the toe. Do not wear open-toe sandals.  Use tools that help you move around (mobility aids) if they are needed. These include: ? Canes. ? Walkers. ? Scooters. ? Crutches.  Review your medicines with your doctor. Some medicines can make you feel dizzy. This can increase your chance of falling. Ask your doctor what other things you can do to help prevent falls. Where to find more information  Centers for Disease Control and Prevention, [facility]AD[facility]: https://garcia.biz/  [employer] on Aging: BrainJudge.co.uk Contact a doctor if:  You are afraid of falling at home.  You feel weak, drowsy, or dizzy at home.  You fall at home. Summary  There are many simple things that you can do to make your home safe and to help prevent falls.  Ways to make your home safe include removing tripping hazards and installing grab bars in the bathroom.  Ask for help when making these changes in your home. This information is not intended to replace advice given to you by your health care provider. Make sure you discuss any questions you have with your health care provider. Document Released: 07/27/2009 Document Revised: 01/21/2019 Document Reviewed: 05/15/2017 Elsevier Patient Education  2020 [place] Maintenance, Female Adopting a healthy lifestyle and getting preventive care are important in promoting health and wellness. Ask your health care provider about:  The right schedule for you to have regular tests and exams.  Things you can do on your own to prevent diseases and keep yourself healthy. What should I know about diet, weight, and exercise? Eat a healthy diet   Eat a diet that includes plenty of vegetables, fruits, low-fat dairy products, and lean  protein.  Do not eat a lot of foods that are high in solid fats, added sugars, or sodium. Maintain a healthy weight Body mass index (BMI) is used to identify weight problems. It estimates body fat based on height and weight. Your health care provider can help determine your BMI and help you achieve or maintain a healthy weight. Get regular exercise Get regular exercise. This is one of the most important things you can do for your health. Most adults should:  Exercise for at least 150 minutes each week. The exercise should increase  your heart rate and make you sweat (moderate-intensity exercise).  Do strengthening exercises at least twice a week. This is in addition to the moderate-intensity exercise.  Spend less time sitting. Even light physical activity can be beneficial. Watch cholesterol and blood lipids Have your blood tested for lipids and cholesterol at 83 years of age, then have this test every 5 years. Have your cholesterol levels checked more often if:  Your lipid or cholesterol levels are high.  You are older than 83 years of age.  You are at high risk for heart disease. What should I know about cancer screening? Depending on your health history and family history, you may need to have cancer screening at various ages. This may include screening for:  Breast cancer.  Cervical cancer.  Colorectal cancer.  Skin cancer.  Lung cancer. What should I know about heart disease, diabetes, and high blood pressure? Blood pressure and heart disease  High blood pressure causes heart disease and increases the risk of stroke. This is more likely to develop in people who have high blood pressure readings, are of African descent, or are overweight.  Have your blood pressure checked: ? Every 3-5 years if you are 71-46 years of age. ? Every year if you are 61 years old or older. Diabetes Have regular diabetes screenings. This checks your fasting blood sugar level. Have the screening  done:  Once every three years after age 61 if you are at a normal weight and have a low risk for diabetes.  More often and at a younger age if you are overweight or have a high risk for diabetes. What should I know about preventing infection? Hepatitis B If you have a higher risk for hepatitis B, you should be screened for this virus. Talk with your health care provider to find out if you are at risk for hepatitis B infection. Hepatitis C Testing is recommended for:  Everyone born from [birth year] through 1963.  Anyone with known risk factors for hepatitis C. Sexually transmitted infections (STIs)  Get screened for STIs, including gonorrhea and chlamydia, if: ? You are sexually active and are younger than 83 years of age. ? You are older than 83 years of age and your health care provider tells you that you are at risk for this type of infection. ? Your sexual activity has changed since you were last screened, and you are at increased risk for chlamydia or gonorrhea. Ask your health care provider if you are at risk.  Ask your health care provider about whether you are at high risk for HIV. Your health care provider may recommend a prescription medicine to help prevent HIV infection. If you choose to take medicine to prevent HIV, you should first get tested for HIV. You should then be tested every 3 months for as long as you are taking the medicine. Pregnancy  If you are about to stop having your period (premenopausal) and you may become pregnant, seek counseling before you get pregnant.  Take 400 to 800 micrograms (mcg) of folic acid every day if you become pregnant.  Ask for birth control (contraception) if you want to prevent pregnancy. Osteoporosis and menopause Osteoporosis is a disease in which the bones lose minerals and strength with aging. This can result in bone fractures. If you are 64 years old or older, or if you are at risk for osteoporosis and fractures, ask your health care  provider if you should:  Be screened for bone loss.  Take a  calcium or vitamin D supplement to lower your risk of fractures.  Be given hormone replacement therapy (HRT) to treat symptoms of menopause. Follow these instructions at home: Lifestyle  Do not use any products that contain nicotine or tobacco, such as cigarettes, e-cigarettes, and chewing tobacco. If you need help quitting, ask your health care provider.  Do not use street drugs.  Do not share needles.  Ask your health care provider for help if you need support or information about quitting drugs. Alcohol use  Do not drink alcohol if: ? Your health care provider tells you not to drink. ? You are pregnant, may be pregnant, or are planning to become pregnant.  If you drink alcohol: ? Limit how much you use to 0-1 drink a day. ? Limit intake if you are breastfeeding.  Be aware of how much alcohol is in your drink. In the [place] one drink equals one 12 oz bottle of beer (355 mL), one 5 oz glass of wine (148 mL), or one 1 oz glass of hard liquor (44 mL). General instructions  Schedule regular health, dental, and eye exams.  Stay current with your vaccines.  Tell your health care provider if: ? You often feel depressed. ? You have ever been abused or do not feel safe at home. Summary  Adopting a healthy lifestyle and getting preventive care are important in promoting health and wellness.  Follow your health care provider's instructions about healthy diet, exercising, and getting tested or screened for diseases.  Follow your health care provider's instructions on monitoring your cholesterol and blood pressure. This information is not intended to replace advice given to you by your health care provider. Make sure you discuss any questions you have with your health care provider. Document Released: 04/15/2011 Document Revised: 09/23/2018 Document Reviewed: 09/23/2018 Elsevier Patient Education  2020 [employer].

## 2019-09-08 ENCOUNTER — Other Ambulatory Visit: Payer: Self-pay

## 2019-09-08 ENCOUNTER — Ambulatory Visit (INDEPENDENT_AMBULATORY_CARE_PROVIDER_SITE_OTHER): Payer: Medicare HMO | Admitting: Nurse Practitioner

## 2019-09-08 DIAGNOSIS — D509 Iron deficiency anemia, unspecified: Secondary | ICD-10-CM

## 2019-09-13 ENCOUNTER — Other Ambulatory Visit: Payer: Self-pay | Admitting: *Deleted

## 2019-09-13 MED ORDER — RIVAROXABAN 15 MG PO TABS: 15.0000 mg | ORAL_TABLET | Freq: Every day | ORAL | 0 refills | Status: DC

## 2019-09-20 ENCOUNTER — Telehealth: Payer: Self-pay | Admitting: Internal Medicine

## 2019-09-20 NOTE — Telephone Encounter (Signed)
[phone] please call patient, her pcp took labs last month and she wants to know if we can use those labs.  She received letter from here that she is to have labs done

## 2019-09-20 NOTE — Telephone Encounter (Signed)
FYI, Spoke with pt. She had labs done with her PCP Dr. Anastasio Champion on 08/24/2019. Pt wanted [facility] to know that her labs were in Epic if needed.

## 2019-09-23 ENCOUNTER — Telehealth (INDEPENDENT_AMBULATORY_CARE_PROVIDER_SITE_OTHER): Payer: Self-pay

## 2019-09-23 NOTE — Telephone Encounter (Signed)
I requested CBC and ferritin to be done at end of 09/2019.   Unfortunately the labs Dr. Anastasio Champion did last month did not include CBC and ferritin.   She will still need to have our labs done.

## 2019-09-24 NOTE — Telephone Encounter (Signed)
From what I can see of the blood work, and comments from Dr. Sydell Axon, she needs to continue to take the iron pill and also she should take the fluid pill.  I do not know what she is talking about "stroke" in her blood work.  If she is talking about the D-dimer that was positive, this is why we sent her for venous Doppler to look for DVT and this was negative.

## 2019-09-24 NOTE — Telephone Encounter (Signed)
Noted. Spoke with pt. Pt is aware.  

## 2019-09-28 ENCOUNTER — Encounter (INDEPENDENT_AMBULATORY_CARE_PROVIDER_SITE_OTHER): Payer: Self-pay | Admitting: Internal Medicine

## 2019-09-28 ENCOUNTER — Telehealth (INDEPENDENT_AMBULATORY_CARE_PROVIDER_SITE_OTHER): Payer: Medicare HMO | Admitting: Internal Medicine

## 2019-09-28 ENCOUNTER — Other Ambulatory Visit: Payer: Medicare HMO

## 2019-09-28 DIAGNOSIS — M7989 Other specified soft tissue disorders: Secondary | ICD-10-CM

## 2019-09-28 MED ORDER — FUROSEMIDE 40 MG PO TABS: 40.0000 mg | ORAL_TABLET | Freq: Two times a day (BID) | ORAL | 3 refills | Status: DC

## 2019-09-28 NOTE — Progress Notes (Signed)
Metrics: Intervention Frequency ACO  Documented Smoking Status Yearly  Screened one or more times in 24 months  Cessation Counseling or  Active cessation medication Past 24 months  Past 24 months   Guideline developer: UpToDate (See UpToDate for funding source) Date Released: 2014       Wellness Office Visit  Subjective:  Patient ID: Sara Garcia, female    DOB: 09/07/1928  Age: 85 y.o. MRN: [id number]  CC: This is a phone call visit with the permission of the patient who is at home and I am in my office.  I was easily able to recognize her voice. Her chief complaint is leg swelling but she is also concerned about COVID-19 disease. HPI  She thinks she may have symptoms of COVID-19 disease such as headache and possibly low-grade fever although she is not sure with some cough. She is also concerned about her bilateral leg swelling which has become worse apparently.  She is taking Lasix 40 mg in the morning.  We did investigate her leg swelling with venous Doppler but there was no evidence of DVT. Past Medical History:  Diagnosis Date  . 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 Arthritis   . Asthma   . DVT (deep venous thrombosis) ([facility]) 2011/2012  . Esophageal dysphagia    limited food and pill  . Esophageal reflux   . Essential hypertension, benign   . GERD (gastroesophageal reflux disease)   . Osteoporosis   . Overactive bladder   . Paroxysmal atrial fibrillation ([facility])    a. on Xarelto for anticoagulation  . 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 Stroke ([facility])   . Unspecified prolapse of vaginal walls       Family History  Problem Relation Age of Onset  . Heart disease Mother   . Stroke Mother   . Emphysema Father   . COPD Sister   . Liver disease Brother   . Colon polyps Neg Hx   . Colon cancer Neg Hx     Social History   Social History Narrative   Married for 21 years in second marriage.First marriage lasted 88 years.Lives with husband.   Social History   Tobacco Use  . Smoking  status: Never Smoker  . Smokeless tobacco: Never Used  Substance Use Topics  . Alcohol use: No    Alcohol/week: 0.0 standard drinks    Current Meds  Medication Sig  . acetaminophen (TYLENOL) 650 MG CR tablet Take 650 mg by mouth every 8 (eight) hours as needed for pain.  . cholecalciferol (VITAMIN D) 1000 units tablet Take 1,000 Units by mouth 3 (three) times daily.  Marland Kitchen diltiazem (CARDIZEM) 60 MG tablet TAKE 1 TABLET BY MOUTH 3 TIMES A DAY  . DULoxetine (CYMBALTA) 60 MG capsule TAKE 1 CAPSULE BY MOUTH TWICE DAILY  . esomeprazole (NEXIUM) 20 MG capsule TAKE 1 CAPSULE BY MOUTH ONCE DAILY BEFORE BREAKFAST  . Fluticasone-Salmeterol (ADVAIR) 250-50 MCG/DOSE AEPB Inhale 1 puff into the lungs daily.   . furosemide (LASIX) 40 MG tablet Take 1 tablet (40 mg total) by mouth daily.  . Iron-FA-B Cmp-C-Biot-Probiotic (FUSION PLUS) CAPS Take 1 capsule by mouth daily.  . metoprolol tartrate (LOPRESSOR) 25 MG tablet TAKE 1 AND 1/2 TABLETS BY MOUTH TWICE DAILY  . NP THYROID 60 MG tablet TAKE 1 TABLET BY MOUTH DAILY  . Rivaroxaban (XARELTO) 15 MG TABS tablet Take 1 tablet (15 mg total) by mouth daily with supper.  . vitamin B-12 (CYANOCOBALAMIN) 1000 MCG tablet Take 1,000  mcg by mouth daily.       Objective:   Today's Vitals: There were no vitals taken for this visit. Vitals with BMI 09/28/2019 09/07/2019 08/24/2019  Height (No Data) - 5' 2"   Weight (No Data) - 141 lbs  BMI - - 23.01  Systolic (No Data) (No Data) 720  Diastolic (No Data) (No Data) 58  Pulse - - 64     Physical Exam   She appears to be alert and orientated on the phone.    Assessment   1. Leg swelling       Tests ordered Orders Placed This Encounter  Procedures  . CMP with eGFR([facility])     Plan: 1. I recommended that she increase the Lasix to 40 mg twice a day, taking the second dose no later than 2 PM. 2. She will need to get blood work done in about 2 weeks time for electrolytes and I have ordered this test  and she will go to the [facility] lab to get this done. 3. I gave her the phone number to get COVID-19 test done today. 4. Follow-up as scheduled. 5. This phone call lasted 17 minutes and 41 seconds.   Meds ordered this encounter  Medications  . furosemide (LASIX) 40 MG tablet    Sig: Take 1 tablet (40 mg total) by mouth 2 (two) times daily.    Dispense:  60 tablet    Refill:  3    Cari Vandeberg Luther Parody, MD

## 2019-10-01 ENCOUNTER — Encounter (INDEPENDENT_AMBULATORY_CARE_PROVIDER_SITE_OTHER): Payer: Self-pay | Admitting: Internal Medicine

## 2019-10-01 LAB — COMPLETE METABOLIC PANEL WITH GFR
AG Ratio: 1.2 (calc) (ref 1.0–2.5)
ALT: 24 U/L (ref 6–29)
AST: 24 U/L (ref 10–35)
Albumin: 3.9 g/dL (ref 3.6–5.1)
Alkaline phosphatase (APISO): 90 U/L (ref 37–153)
BUN: 25 mg/dL (ref 7–25)
CO2: 25 mmol/L (ref 20–32)
Calcium: 9.7 mg/dL (ref 8.6–10.4)
Chloride: 105 mmol/L (ref 98–110)
Creat: 0.83 mg/dL (ref 0.60–0.88)
GFR, Est African American: 72 mL/min/{1.73_m2} (ref >=60)
GFR, Est Non African American: 63 mL/min/{1.73_m2} (ref >=60)
Globulin: 3.3 g/dL (calc) (ref 1.9–3.7)
Glucose, Bld: 104 mg/dL (ref 65–139)
Potassium: 4.2 mmol/L (ref 3.5–5.3)
Sodium: 143 mmol/L (ref 135–146)
Total Bilirubin: 0.4 mg/dL (ref 0.2–1.2)
Total Protein: 7.2 g/dL (ref 6.1–8.1)

## 2019-10-01 LAB — CBC WITH DIFFERENTIAL/PLATELET
Absolute Monocytes: 1038 cells/uL — ABNORMAL HIGH (ref 200–950)
Basophils Absolute: 79 cells/uL (ref 0–200)
Basophils Relative: 0.9 %
Eosinophils Absolute: 590 cells/uL — ABNORMAL HIGH (ref 15–500)
Eosinophils Relative: 6.7 %
HCT: 42.2 % (ref 35.0–45.0)
Hemoglobin: 14.3 g/dL (ref 11.7–15.5)
Lymphs Abs: 3467 cells/uL (ref 850–3900)
MCH: 30.4 pg (ref 27.0–33.0)
MCHC: 33.9 g/dL (ref 32.0–36.0)
MCV: 89.8 fL (ref 80.0–100.0)
MPV: 10.3 fL (ref 7.5–12.5)
Monocytes Relative: 11.8 %
Neutro Abs: 3626 cells/uL (ref 1500–7800)
Neutrophils Relative %: 41.2 %
Platelets: 291 10*3/uL (ref 140–400)
RBC: 4.7 10*6/uL (ref 3.80–5.10)
RDW: 14.1 % (ref 11.0–15.0)
Total Lymphocyte: 39.4 %
WBC: 8.8 10*3/uL (ref 3.8–10.8)

## 2019-10-01 LAB — FERRITIN: Ferritin: 45 ng/mL (ref 16–288)

## 2019-10-12 ENCOUNTER — Ambulatory Visit (INDEPENDENT_AMBULATORY_CARE_PROVIDER_SITE_OTHER): Payer: Medicare HMO | Admitting: Internal Medicine

## 2019-10-12 ENCOUNTER — Encounter (INDEPENDENT_AMBULATORY_CARE_PROVIDER_SITE_OTHER): Payer: Self-pay | Admitting: Internal Medicine

## 2019-10-12 DIAGNOSIS — M7989 Other specified soft tissue disorders: Secondary | ICD-10-CM

## 2019-10-12 NOTE — Progress Notes (Signed)
Metrics: Intervention Frequency ACO  Documented Smoking Status Yearly  Screened one or more times in 24 months

## 2019-10-13 ENCOUNTER — Telehealth: Payer: Self-pay | Admitting: Cardiology

## 2019-10-13 ENCOUNTER — Ambulatory Visit (INDEPENDENT_AMBULATORY_CARE_PROVIDER_SITE_OTHER): Payer: Medicare HMO | Admitting: Internal Medicine

## 2019-10-13 ENCOUNTER — Encounter (INDEPENDENT_AMBULATORY_CARE_PROVIDER_SITE_OTHER): Payer: Self-pay | Admitting: Internal Medicine

## 2019-10-13 ENCOUNTER — Other Ambulatory Visit: Payer: Self-pay

## 2019-10-13 VITALS — BP 132/78 | HR 97 | Ht 62.0 in | Wt 150.0 lb

## 2019-10-13 DIAGNOSIS — M7989 Other specified soft tissue disorders: Secondary | ICD-10-CM | POA: Diagnosis not present

## 2019-10-13 DIAGNOSIS — L03119 Cellulitis of unspecified part of limb: Secondary | ICD-10-CM | POA: Diagnosis not present

## 2019-10-13 DIAGNOSIS — L02419 Cutaneous abscess of limb, unspecified: Secondary | ICD-10-CM | POA: Diagnosis not present

## 2019-10-13 MED ORDER — METOLAZONE 2.5 MG PO TABS: 2.5000 mg | ORAL_TABLET | Freq: Every day | ORAL | 1 refills | Status: DC

## 2019-10-13 MED ORDER — LEVOFLOXACIN 500 MG PO TABS: 500.0000 mg | ORAL_TABLET | Freq: Every day | ORAL | 0 refills | Status: DC

## 2019-10-13 NOTE — Telephone Encounter (Signed)
Patient calling the office for samples of medication:   1.  What medication and dosage are you requesting samples for? XARELTO 15 MG   2.  Are you currently out of this medication?    

## 2019-10-13 NOTE — Telephone Encounter (Signed)
Pt son Audry Pili aware and will come pick up samples

## 2019-10-13 NOTE — Progress Notes (Signed)
Metrics: Intervention Frequency ACO  Documented Smoking Status Yearly  Screened one or more times in 24 months  Cessation Counseling or  Active cessation medication Past 24 months  Past 24 months   Guideline developer: UpToDate (See UpToDate for funding source) Date Released: 2014       Wellness Office Visit  Subjective:  Patient ID: Sara Garcia, female    DOB: 1927-11-23  Age: 85 y.o. MRN: [id number]  CC: This lady comes in for an acute visit because of bilateral swollen and painful legs which have become red and are draining fluid. HPI  The symptoms of been going on for the last couple of weeks or so.  She denies any fever.  She has been taking Lasix 40 mg twice a day but swelling has become worse.  I see that she has gained about 9 pounds in weight.  She denies any dyspnea, or cough. Past Medical History:  Diagnosis Date  . 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 Arthritis   . Asthma   . DVT (deep venous thrombosis) ([facility]) 2011/2012  . Esophageal dysphagia    limited food and pill  . Esophageal reflux   . Essential hypertension, benign   . GERD (gastroesophageal reflux disease)   . Osteoporosis   . Overactive bladder   . Paroxysmal atrial fibrillation ([facility])    a. on Xarelto for anticoagulation  . 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 Stroke ([facility])   . Unspecified prolapse of vaginal walls       Family History  Problem Relation Age of Onset  . Heart disease Mother   . Stroke Mother   . Emphysema Father   . COPD Sister   . Liver disease Brother   . Colon polyps Neg Hx   . Colon cancer Neg Hx     Social History   Social History Narrative   Married for 21 years in second marriage.First marriage lasted 52 years.Lives with husband.   Social History   Tobacco Use  . Smoking status: Never Smoker  . Smokeless tobacco: Never Used  Substance Use Topics  . Alcohol use: No    Alcohol/week: 0.0 standard drinks    Current Meds  Medication Sig  . acetaminophen (TYLENOL) 650 MG CR  tablet Take 650 mg by mouth every 8 (eight) hours as needed for pain.  . cholecalciferol (VITAMIN D) 1000 units tablet Take 1,000 Units by mouth 3 (three) times daily.  Marland Kitchen diltiazem (CARDIZEM) 60 MG tablet TAKE 1 TABLET BY MOUTH 3 TIMES A DAY  . DULoxetine (CYMBALTA) 60 MG capsule TAKE 1 CAPSULE BY MOUTH TWICE DAILY  . esomeprazole (NEXIUM) 20 MG capsule TAKE 1 CAPSULE BY MOUTH ONCE DAILY BEFORE BREAKFAST  . Fluticasone-Salmeterol (ADVAIR) 250-50 MCG/DOSE AEPB Inhale 1 puff into the lungs daily.   . furosemide (LASIX) 40 MG tablet Take 1 tablet (40 mg total) by mouth daily.  . furosemide (LASIX) 40 MG tablet Take 1 tablet (40 mg total) by mouth 2 (two) times daily.  . Iron-FA-B Cmp-C-Biot-Probiotic (FUSION PLUS) CAPS Take 1 capsule by mouth daily.  . metoprolol tartrate (LOPRESSOR) 25 MG tablet TAKE 1 AND 1/2 TABLETS BY MOUTH TWICE DAILY  . NP THYROID 60 MG tablet TAKE 1 TABLET BY MOUTH DAILY  . Rivaroxaban (XARELTO) 15 MG TABS tablet Take 1 tablet (15 mg total) by mouth daily with supper.  . vitamin B-12 (CYANOCOBALAMIN) 1000 MCG tablet Take 1,000 mcg by mouth daily.        Objective:  Today's Vitals: BP 132/78   Pulse 97   Ht 5\' 2"  (1.575 m)   Wt 150 lb (68 kg)   SpO2 97%   BMI 27.44 kg/m  Vitals with BMI 10/13/2019 10/13/2019 09/28/2019  Height 5\' 2"  (No Data) (No Data)  Weight 150 lbs (No Data) (No Data)  BMI 0000000 - -  Systolic Q000111Q (No Data) (No Data)  Diastolic 78 (No Data) (No Data)  Pulse 97 - -     Physical Exam She has gained 9 pounds since the last time she was physically seen in the office in November.  She has bilateral lower leg edema from the knee downwards and there is cellulitis in her lower legs.  There is weeping/fluid exuding from the skin as it is very tight and swollen.      Assessment   1. Leg swelling   2. Cellulitis and abscess of leg       Tests ordered No orders of the defined types were placed in this encounter.    Plan: 1. I have  sent a prescription for Levaquin for her presumed cellulitis.  She is allergic to many antibiotics including penicillin.  This is a 10-day course. 2. I have also sent a prescription for metolazone 2.5 mg daily which she takes in the morning together with the Lasix 40 mg twice a day.  I told her that her potassium may drop and she needs to initially eat foods with potassium and I mentioned a few.  We will check her electrolytes and potassium on the next visit which will be just over a week's time.   Meds ordered this encounter  Medications  . levofloxacin (LEVAQUIN) 500 MG tablet    Sig: Take 1 tablet (500 mg total) by mouth daily.    Dispense:  10 tablet    Refill:  0  . metolazone (ZAROXOLYN) 2.5 MG tablet    Sig: Take 1 tablet (2.5 mg total) by mouth daily.    Dispense:  30 tablet    Refill:  1    Willia Genrich Luther Parody, MD

## 2019-10-21 ENCOUNTER — Ambulatory Visit (INDEPENDENT_AMBULATORY_CARE_PROVIDER_SITE_OTHER): Payer: Medicare HMO | Admitting: Internal Medicine

## 2019-10-21 ENCOUNTER — Encounter (INDEPENDENT_AMBULATORY_CARE_PROVIDER_SITE_OTHER): Payer: Self-pay | Admitting: Internal Medicine

## 2019-10-21 ENCOUNTER — Other Ambulatory Visit: Payer: Self-pay

## 2019-10-21 VITALS — BP 135/78 | HR 75 | Temp 97.3°F | Ht 62.0 in | Wt 149.2 lb

## 2019-10-21 DIAGNOSIS — L97911 Non-pressure chronic ulcer of unspecified part of right lower leg limited to breakdown of skin: Secondary | ICD-10-CM

## 2019-10-21 DIAGNOSIS — M7989 Other specified soft tissue disorders: Secondary | ICD-10-CM | POA: Diagnosis not present

## 2019-10-21 MED ORDER — LEVOFLOXACIN 500 MG PO TABS: 500.0000 mg | ORAL_TABLET | Freq: Every day | ORAL | 0 refills | Status: DC

## 2019-10-21 NOTE — Progress Notes (Signed)
Metrics: Intervention Frequency ACO  Documented Smoking Status Yearly  Screened one or more times in 24 months  Cessation Counseling or  Active cessation medication Past 24 months  Past 24 months   Guideline developer: UpToDate (See UpToDate for funding source) Date Released: 2014       Wellness Office Visit  Subjective:  Patient ID: Sara Garcia, female    DOB: [birth year]  Age: 86 y.o. MRN: [id number]  CC: This lady comes in for follow-up of leg edema/swelling. HPI  I had started on metolazone the last time I saw her in addition to the furosemide that she is taking.  She has noticed that the swelling has gone down fairly significantly.  The redness in her legs has improved but not completely gone.  She has now developed what looks like ulcers in the posterior part of her right lower leg. Past Medical History:  Diagnosis Date  . 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 Arthritis   . Asthma   . DVT (deep venous thrombosis) ([place]) 2011/2012  . Esophageal dysphagia    limited food and pill  . Esophageal reflux   . Essential hypertension, benign   . GERD (gastroesophageal reflux disease)   . Osteoporosis   . Overactive bladder   . Paroxysmal atrial fibrillation ([facility])    a. on Xarelto for anticoagulation  . 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 Stroke ([place])   . Unspecified prolapse of vaginal walls       Family History  Problem Relation Age of Onset  . Heart disease Mother   . Stroke Mother   . Emphysema Father   . COPD Sister   . Liver disease Brother   . Colon polyps Neg Hx   . Colon cancer Neg Hx     Social History   Social History Narrative   Married for 21 years in second marriage.First marriage lasted 91 years.Lives with husband.   Social History   Tobacco Use  . Smoking status: Never Smoker  . Smokeless tobacco: Never Used  Substance Use Topics  . Alcohol use: No    Alcohol/week: 0.0 standard drinks    Current Meds  Medication Sig  . acetaminophen (TYLENOL) 650 MG CR  tablet Take 650 mg by mouth every 8 (eight) hours as needed for pain.  . cholecalciferol (VITAMIN D) 1000 units tablet Take 1,000 Units by mouth 3 (three) times daily.  Marland Kitchen diltiazem (CARDIZEM) 60 MG tablet TAKE 1 TABLET BY MOUTH 3 TIMES A DAY  . DULoxetine (CYMBALTA) 60 MG capsule TAKE 1 CAPSULE BY MOUTH TWICE DAILY  . esomeprazole (NEXIUM) 20 MG capsule TAKE 1 CAPSULE BY MOUTH ONCE DAILY BEFORE BREAKFAST  . Fluticasone-Salmeterol (ADVAIR) 250-50 MCG/DOSE AEPB Inhale 1 puff into the lungs daily.   . furosemide (LASIX) 40 MG tablet Take 1 tablet (40 mg total) by mouth daily.  . furosemide (LASIX) 40 MG tablet Take 1 tablet (40 mg total) by mouth 2 (two) times daily.  . Iron-FA-B Cmp-C-Biot-Probiotic (FUSION PLUS) CAPS Take 1 capsule by mouth daily.  Marland Kitchen levofloxacin (LEVAQUIN) 500 MG tablet Take 1 tablet (500 mg total) by mouth daily.  . metolazone (ZAROXOLYN) 2.5 MG tablet Take 1 tablet (2.5 mg total) by mouth daily.  . metoprolol tartrate (LOPRESSOR) 25 MG tablet TAKE 1 AND 1/2 TABLETS BY MOUTH TWICE DAILY  . NP THYROID 60 MG tablet TAKE 1 TABLET BY MOUTH DAILY  . vitamin B-12 (CYANOCOBALAMIN) 1000 MCG tablet Take 1,000 mcg by mouth daily.   . [  DISCONTINUED] levofloxacin (LEVAQUIN) 500 MG tablet Take 1 tablet (500 mg total) by mouth daily.      Objective:   Today's Vitals: BP 135/78 (BP Location: Left Arm, Patient Position: Sitting, Cuff Size: Normal)   Pulse 75   Temp (!) 97.3 F (36.3 C) (Temporal)   Ht 5' 2"  (1.575 m)   Wt 149 lb 3.2 oz (67.7 kg)   SpO2 97%   BMI 27.29 kg/m  Vitals with BMI 10/21/2019 10/13/2019 10/13/2019  Height 5' 2"  5' 2"  (No Data)  Weight 149 lbs 3 oz 150 lbs (No Data)  BMI 36.43 83.77 -  Systolic 939 688 (No Data)  Diastolic 78 78 (No Data)  Pulse 75 97 -     Physical Exam  Indeed both her legs are less swollen although there is still pitting edema. There appears to be some superficial ulcers on the posterior part of the right lower leg in the Achilles  tendon area.  They are not very deep.  They measure about 1 cm in diameter and I believe there are couple of them.     Assessment   1. Leg swelling   2. Ulcer of right lower extremity, limited to breakdown of skin ([place])       Tests ordered Orders Placed This Encounter  Procedures  . CMP with eGFR([facility])  . Ambulatory referral to Wound Clinic     Plan: 1. We will check we will check electrolytes to make sure her potassium is acceptable and she is not getting excessively dehydrated. 2. I will send her to wound care clinic to make sure that the ulcers are managed.  In the meantime, I recommended some Neosporin and dry dressings.   Meds ordered this encounter  Medications  . levofloxacin (LEVAQUIN) 500 MG tablet    Sig: Take 1 tablet (500 mg total) by mouth daily.    Dispense:  10 tablet    Refill:  0    Dalyn Kjos Luther Parody, MD

## 2019-10-22 LAB — COMPLETE METABOLIC PANEL WITH GFR
AG Ratio: 1.1 (calc) (ref 1.0–2.5)
ALT: 30 U/L — ABNORMAL HIGH (ref 6–29)
AST: 38 U/L — ABNORMAL HIGH (ref 10–35)
Albumin: 3.7 g/dL (ref 3.6–5.1)
Alkaline phosphatase (APISO): 86 U/L (ref 37–153)
BUN/Creatinine Ratio: 31 (calc) — ABNORMAL HIGH (ref 6–22)
BUN: 52 mg/dL — ABNORMAL HIGH (ref 7–25)
CO2: 31 mmol/L (ref 20–32)
Calcium: 9.5 mg/dL (ref 8.6–10.4)
Chloride: 91 mmol/L — ABNORMAL LOW (ref 98–110)
Creat: 1.66 mg/dL — ABNORMAL HIGH (ref 0.60–0.88)
GFR, Est African American: 31 mL/min/{1.73_m2} — ABNORMAL LOW (ref >=60)
GFR, Est Non African American: 27 mL/min/{1.73_m2} — ABNORMAL LOW (ref >=60)
Globulin: 3.4 g/dL (calc) (ref 1.9–3.7)
Glucose, Bld: 146 mg/dL — ABNORMAL HIGH (ref 65–99)
Potassium: 2.8 mmol/L — ABNORMAL LOW (ref 3.5–5.3)
Sodium: 138 mmol/L (ref 135–146)
Total Bilirubin: 0.6 mg/dL (ref 0.2–1.2)
Total Protein: 7.1 g/dL (ref 6.1–8.1)

## 2019-10-22 NOTE — Progress Notes (Signed)
I have spoken to the patient's caregiver regarding blood test.  I have instructed that the metolazone be stopped completely.  She should not take any Lasix further today or over the weekend and restart on Monday, January 11 with Lasix 40 mg twice a day as before.  The potassium is low and she will eat more bananas and sweet potatoes.  I think this would be preferable versus putting her on potassium pills which are going to be difficult for her to swallow.

## 2019-10-27 ENCOUNTER — Other Ambulatory Visit (INDEPENDENT_AMBULATORY_CARE_PROVIDER_SITE_OTHER): Payer: Self-pay

## 2019-10-27 DIAGNOSIS — L97911 Non-pressure chronic ulcer of unspecified part of right lower leg limited to breakdown of skin: Secondary | ICD-10-CM

## 2019-10-27 DIAGNOSIS — L02419 Cutaneous abscess of limb, unspecified: Secondary | ICD-10-CM

## 2019-11-08 ENCOUNTER — Ambulatory Visit (INDEPENDENT_AMBULATORY_CARE_PROVIDER_SITE_OTHER): Payer: Medicare HMO | Admitting: Internal Medicine

## 2019-11-08 ENCOUNTER — Other Ambulatory Visit (INDEPENDENT_AMBULATORY_CARE_PROVIDER_SITE_OTHER): Payer: Self-pay

## 2019-11-08 ENCOUNTER — Telehealth (INDEPENDENT_AMBULATORY_CARE_PROVIDER_SITE_OTHER): Payer: Self-pay

## 2019-11-08 ENCOUNTER — Encounter (INDEPENDENT_AMBULATORY_CARE_PROVIDER_SITE_OTHER): Payer: Self-pay | Admitting: Internal Medicine

## 2019-11-08 ENCOUNTER — Other Ambulatory Visit: Payer: Self-pay

## 2019-11-08 VITALS — BP 102/80 | HR 89 | Temp 97.8°F | Ht 63.0 in

## 2019-11-08 DIAGNOSIS — M7989 Other specified soft tissue disorders: Secondary | ICD-10-CM

## 2019-11-08 DIAGNOSIS — N179 Acute kidney failure, unspecified: Secondary | ICD-10-CM | POA: Diagnosis not present

## 2019-11-08 DIAGNOSIS — Z5189 Encounter for other specified aftercare: Secondary | ICD-10-CM

## 2019-11-08 NOTE — Progress Notes (Unsigned)
Pt order entered for the [facility] for would care consult & treatment. Pt prefers someone come to home.

## 2019-11-08 NOTE — Telephone Encounter (Signed)
Pt does not want to go to [facility], nothing was completed last time on the wound care. However the pt is willing to come to [facility] for treatment, or Home Health can address the wound care

## 2019-11-08 NOTE — Telephone Encounter (Signed)
Sent message to huston @ [facility]. And Dr Darnell Level will finish his note.

## 2019-11-08 NOTE — Progress Notes (Signed)
Metrics: Intervention Frequency ACO  Documented Smoking Status Yearly  Screened one or more times in 24 months  Cessation Counseling or  Active cessation medication Past 24 months  Past 24 months   Guideline developer: UpToDate (See UpToDate for funding source) Date Released: 2014       Wellness Office Visit  Subjective:  Patient ID: Sara Garcia, female    DOB: 07-05-1928  Age: 86 y.o. MRN: [id number]  CC: This lady comes in for follow-up of her bilateral leg edema. HPI She was on metolazone every day and this did seem to improve her leg edema but she went to acute renal failure at which point I had told the caregiver to discontinue metolazone and continue with the Lasix twice a day.  Unfortunately, both her legs more on the left than the right on this occasion have become more swollen again.  She denies any dyspnea.  Past Medical History:  Diagnosis Date  . 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 Arthritis   . Asthma   . DVT (deep venous thrombosis) ([place]) 2011/2012  . Esophageal dysphagia    limited food and pill  . Esophageal reflux   . Essential hypertension, benign   . GERD (gastroesophageal reflux disease)   . Osteoporosis   . Overactive bladder   . Paroxysmal atrial fibrillation ([facility])    a. on Xarelto for anticoagulation  . 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 Stroke ([place])   . Unspecified prolapse of vaginal walls       Family History  Problem Relation Age of Onset  . Heart disease Mother   . Stroke Mother   . Emphysema Father   . COPD Sister   . Liver disease Brother   . Colon polyps Neg Hx   . Colon cancer Neg Hx     Social History   Social History Narrative   Married for 21 years in second marriage.First marriage lasted 20 years.Lives with husband.   Social History   Tobacco Use  . Smoking status: Never Smoker  . Smokeless tobacco: Never Used  Substance Use Topics  . Alcohol use: No    Alcohol/week: 0.0 standard drinks    Current Meds  Medication Sig  .  acetaminophen (TYLENOL) 650 MG CR tablet Take 650 mg by mouth every 8 (eight) hours as needed for pain.  . cholecalciferol (VITAMIN D) 1000 units tablet Take 1,000 Units by mouth 3 (three) times daily.  Marland Kitchen diltiazem (CARDIZEM) 60 MG tablet TAKE 1 TABLET BY MOUTH 3 TIMES A DAY  . DULoxetine (CYMBALTA) 60 MG capsule TAKE 1 CAPSULE BY MOUTH TWICE DAILY  . esomeprazole (NEXIUM) 20 MG capsule TAKE 1 CAPSULE BY MOUTH ONCE DAILY BEFORE BREAKFAST  . Fluticasone-Salmeterol (ADVAIR) 250-50 MCG/DOSE AEPB Inhale 1 puff into the lungs daily.   . furosemide (LASIX) 40 MG tablet Take 1 tablet (40 mg total) by mouth 2 (two) times daily.  . Iron-FA-B Cmp-C-Biot-Probiotic (FUSION PLUS) CAPS Take 1 capsule by mouth daily.  Marland Kitchen levofloxacin (LEVAQUIN) 500 MG tablet Take 1 tablet (500 mg total) by mouth daily.  . metolazone (ZAROXOLYN) 2.5 MG tablet Take 1 tablet (2.5 mg total) by mouth daily.  . metoprolol tartrate (LOPRESSOR) 25 MG tablet TAKE 1 AND 1/2 TABLETS BY MOUTH TWICE DAILY  . NP THYROID 60 MG tablet TAKE 1 TABLET BY MOUTH DAILY  . Rivaroxaban (XARELTO) 15 MG TABS tablet Take 1 tablet (15 mg total) by mouth daily with supper.  . vitamin B-12 (CYANOCOBALAMIN) 1000 MCG tablet  Take 1,000 mcg by mouth daily.       Objective:   Today's Vitals: BP 102/80 (BP Location: Right Arm, Patient Position: Sitting, Cuff Size: Normal)   Pulse 89   Temp 97.8 F (36.6 C) (Temporal)   Ht 5\' 3"  (1.6 m)   BMI 26.43 kg/m  Vitals with BMI 11/08/2019 10/21/2019 10/13/2019  Height 5\' 3"  5\' 2"  5\' 2"   Weight - 149 lbs 3 oz 150 lbs  BMI - 0000000 0000000  Systolic [id number] A999333 [id number]  Diastolic 80 78 78  Pulse 89 75 97     Physical Exam  Patient was in a wheelchair and she was not weighed today.  Both her legs are much more swollen, more significant pitting edema on the left side than the right side.     Assessment   1. Leg swelling   2. Acute renal failure, unspecified acute renal failure type ([place])       Tests  ordered Orders Placed This Encounter  Procedures  . COMPLETE METABOLIC PANEL WITH GFR     Plan: 1. Blood work is ordered above to monitor renal function. 2. I will request home health care and also in the meantime try and get wound care consultation. 3. Further recommendations will depend on blood results and I suspect we will have to go to metolazone probably every other day.  I will let her know. 4. Follow-up with Sarah in about 4 weeks time.   No orders of the defined types were placed in this encounter.   Doree Albee, MD

## 2019-11-09 ENCOUNTER — Other Ambulatory Visit (INDEPENDENT_AMBULATORY_CARE_PROVIDER_SITE_OTHER): Payer: Self-pay | Admitting: Internal Medicine

## 2019-11-09 DIAGNOSIS — M7989 Other specified soft tissue disorders: Secondary | ICD-10-CM

## 2019-11-09 DIAGNOSIS — L97911 Non-pressure chronic ulcer of unspecified part of right lower leg limited to breakdown of skin: Secondary | ICD-10-CM

## 2019-11-09 LAB — COMPLETE METABOLIC PANEL WITH GFR
AG Ratio: 1.2 (calc) (ref 1.0–2.5)
ALT: 16 U/L (ref 6–29)
AST: 22 U/L (ref 10–35)
Albumin: 3.5 g/dL — ABNORMAL LOW (ref 3.6–5.1)
Alkaline phosphatase (APISO): 69 U/L (ref 37–153)
BUN/Creatinine Ratio: 19 (calc) (ref 6–22)
BUN: 19 mg/dL (ref 7–25)
CO2: 31 mmol/L (ref 20–32)
Calcium: 8.9 mg/dL (ref 8.6–10.4)
Chloride: 98 mmol/L (ref 98–110)
Creat: 1.02 mg/dL — ABNORMAL HIGH (ref 0.60–0.88)
GFR, Est African American: 56 mL/min/{1.73_m2} — ABNORMAL LOW (ref >=60)
GFR, Est Non African American: 49 mL/min/{1.73_m2} — ABNORMAL LOW (ref >=60)
Globulin: 2.9 g/dL (calc) (ref 1.9–3.7)
Glucose, Bld: 106 mg/dL — ABNORMAL HIGH (ref 65–99)
Potassium: 3.5 mmol/L (ref 3.5–5.3)
Sodium: 141 mmol/L (ref 135–146)
Total Bilirubin: 0.8 mg/dL (ref 0.2–1.2)
Total Protein: 6.4 g/dL (ref 6.1–8.1)

## 2019-11-09 NOTE — Progress Notes (Signed)
Patient called. Was given instructions to Dtr [name] over phone call. To take 1 tablet every other day of the metalozone.

## 2019-11-10 ENCOUNTER — Telehealth: Payer: Self-pay | Admitting: Cardiology

## 2019-11-10 MED ORDER — RIVAROXABAN 15 MG PO TABS: 15.0000 mg | ORAL_TABLET | Freq: Every day | ORAL | 0 refills | Status: DC

## 2019-11-10 NOTE — Telephone Encounter (Signed)
Patient calling the office for samples of medication:   1.  What medication and dosage are you requesting samples for? XARELTO 15 MG   2.  Are you currently out of this medication?    

## 2019-11-10 NOTE — Telephone Encounter (Signed)
Son Audry Pili will come by office to pick up samples

## 2019-11-14 ENCOUNTER — Ambulatory Visit: Payer: Medicare HMO

## 2019-11-24 ENCOUNTER — Telehealth (INDEPENDENT_AMBULATORY_CARE_PROVIDER_SITE_OTHER): Payer: Medicare HMO | Admitting: Internal Medicine

## 2019-11-24 ENCOUNTER — Telehealth: Payer: Self-pay | Admitting: Internal Medicine

## 2019-11-24 NOTE — Telephone Encounter (Signed)
Pt has questions about her medications. [phone]

## 2019-11-25 ENCOUNTER — Telehealth (INDEPENDENT_AMBULATORY_CARE_PROVIDER_SITE_OTHER): Payer: Medicare HMO | Admitting: Internal Medicine

## 2019-11-25 ENCOUNTER — Ambulatory Visit: Payer: Medicare HMO

## 2019-11-25 NOTE — Telephone Encounter (Signed)
Spoke with pt. Pt has questions about her medications prescribed by her PCP. Pt was advised to call her PCP office about the Cymbalta ect.

## 2019-12-01 ENCOUNTER — Ambulatory Visit (INDEPENDENT_AMBULATORY_CARE_PROVIDER_SITE_OTHER): Payer: Medicare HMO | Admitting: Nurse Practitioner

## 2019-12-01 ENCOUNTER — Encounter (INDEPENDENT_AMBULATORY_CARE_PROVIDER_SITE_OTHER): Payer: Self-pay | Admitting: Nurse Practitioner

## 2019-12-01 ENCOUNTER — Telehealth (INDEPENDENT_AMBULATORY_CARE_PROVIDER_SITE_OTHER): Payer: Medicare HMO | Admitting: Nurse Practitioner

## 2019-12-01 ENCOUNTER — Other Ambulatory Visit: Payer: Self-pay

## 2019-12-01 VITALS — BP 103/47 | HR 96

## 2019-12-01 VITALS — BP 111/63 | HR 78 | Temp 98.1°F | Ht 62.0 in

## 2019-12-01 DIAGNOSIS — I959 Hypotension, unspecified: Secondary | ICD-10-CM

## 2019-12-01 DIAGNOSIS — R251 Tremor, unspecified: Secondary | ICD-10-CM | POA: Insufficient documentation

## 2019-12-01 DIAGNOSIS — I4891 Unspecified atrial fibrillation: Secondary | ICD-10-CM

## 2019-12-01 DIAGNOSIS — I9589 Other hypotension: Secondary | ICD-10-CM

## 2019-12-01 DIAGNOSIS — R5383 Other fatigue: Secondary | ICD-10-CM

## 2019-12-01 DIAGNOSIS — E861 Hypovolemia: Secondary | ICD-10-CM

## 2019-12-01 MED ORDER — DILTIAZEM HCL ER 60 MG PO CP12: 60.0000 mg | ORAL_CAPSULE | Freq: Two times a day (BID) | ORAL | 0 refills | Status: DC

## 2019-12-01 NOTE — Addendum Note (Signed)
Addended by: Ailene Ards on: 12/01/2019 02:33 PM   Modules accepted: Level of Service

## 2019-12-01 NOTE — Progress Notes (Signed)
Due to national recommendations of social distancing related to the [place] pandemic, an audio/visual tele-health visit was felt to be the most appropriate encounter type for this patient today. I connected with  Sara Garcia on 12/01/19 utilizing audio-only technology and verified that I am speaking with the correct person using two identifiers. The patient was located at their home, and I was located at the office of [facility] during the encounter. I discussed the limitations of evaluation and management by telemedicine. The patient expressed understanding and agreed to proceed.  The patient was assisted during this visit by her daughter.  The patient did not have technology available to her for [place] to be able to do a visit utilizing video, thus audio only was used.    Subjective:  Patient ID: Sara Garcia, female    DOB: 16-May-1928  Age: 86 y.o. MRN: [id number]  CC:  Chief Complaint  Patient presents with  . Urgent Care F/U    low BP and elevated HR      HPI  This 86 year old female with past medical history of paroxysmal atrial fibrillation, hypertension, osteoporosis, stroke, asthma, and arthritis presents today with help from her daughter for a virtual visit regarding the above.    The daughter tells me that approximately 2 days ago the patient was seen in the urgent care for vomiting and abdominal pain.  At that time it sounds like there was question about whether or not the patient could be dehydrated and potentially could have a urinary tract infection.  Unfortunately, the daughter tells me the patient was unable to provide a urine sample.  Her daughter, the urgent care did start the patient on a course of nitrofurantoin.  Today, the daughter tells me that the patient is no longer vomiting but still is quite fatigued and lethargic.  She did get vital signs on the patient last night and those are reported below.  The daughter tells me she does not have access to a thermometer.   Because the urgent care told the daughter to hold the patient's blood pressure medications, these vital signs are without taking her medications.  The daughter is not sure how to proceed.   Past Medical History:  Diagnosis Date  . 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 Arthritis   . Asthma   . DVT (deep venous thrombosis) ([place]) 2011/2012  . Esophageal dysphagia    limited food and pill  . Esophageal reflux   . Essential hypertension, benign   . GERD (gastroesophageal reflux disease)   . Osteoporosis   . Overactive bladder   . Paroxysmal atrial fibrillation (H[facility])    a. on Xarelto for anticoagulation  . 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 Stroke ([place])   . Unspecified prolapse of vaginal walls       Family History  Problem Relation Age of Onset  . Heart disease Mother   . Stroke Mother   . Emphysema Father   . COPD Sister   . Liver disease Brother   . Colon polyps Neg Hx   . Colon cancer Neg Hx     Social History   Social History Narrative   Married for 21 years in second marriage.First marriage lasted 80 years.Lives with husband.   Social History   Tobacco Use  . Smoking status: Never Smoker  . Smokeless tobacco: Never Used  Substance Use Topics  . Alcohol use: No    Alcohol/week: 0.0 standard drinks     No  outpatient medications have been marked as taking for the 12/01/19 encounter (Telemedicine) with Ailene Ards, NP.    ROS:  Negative unless otherwise stated in HPI   Objective:   Today's Vitals: BP (!) 103/47 Comment: taken last night at 2200  Pulse 96 Comment: taken last night at 2200 Vitals with BMI 12/01/2019 11/08/2019 10/21/2019  Height - 5\' 3"  5\' 2"   Weight - (No Data) 149 lbs 3 oz  BMI - - 0000000  Systolic [id number] A999333 A999333  Diastolic 47 80 78  Pulse 96 89 75     Physical Exam Comprehensive physical exam not collected as office visit was conducted using audio only technology.      Assessment   No diagnosis found.    Tests ordered No orders of the  defined types were placed in this encounter.    Plan: While reported vital signs are currently fairly stable, these are without use of patient's medications.  Thus for this patient's baseline her blood pressure is low and heart rate is above 90.  Based on her age and the fact that she is lethargic I recommended that the patient needs to be seen emergently for face-to-face evaluation for possible sepsis versus dehydration.  I told the daughter to bring the patient to the emergency department for further evaluation.  She tells me she understands, and will proceed to the emergency department now.   No orders of the defined types were placed in this encounter.   Patient to follow-up as scheduled.  Telephone conversation lasted for 7 minutes.  Ailene Ards, NP

## 2019-12-01 NOTE — Assessment & Plan Note (Addendum)
I believe her borderline low blood pressure is most likely related to mild dehydration at this time.  I encouraged the family to make sure the patient is drinking fluids regularly.  The daughter and son are comfortable with monitoring the patient's vital signs at home.  I encouraged them to check the patient's heart rate and blood pressure twice a day.  They are to continue holding the patient's antihypertensives until the patient's blood pressure is at least 130/70.  Due to her atrial fibrillation I recommended that if the patient's heart rate rises above 110 and sustains then they should give the patient a dose of her diltiazem as long as her blood pressure is also at least 100/70.  If her heart rate rises above 110 and blood pressure is lower than 100/70 they need to proceed to the emergency department.  The son tells me the patient has a hard time taking her diltiazem 3 times a day as currently prescribed, due to forgetting to take the midday dose.  He is wondering if we could prescribe her a twice a day dosing.  I will prescribe 60 mg of diltiazem 12-hour sustained that the patient will take twice a day by mouth.  The son tells me he understands.  I also recommended that they hold her furosemide until her blood pressure has stabilized and is at least 130/70, however if the patient starts to exhibit swelling and her blood pressure is at least 100/70 they can also administer a dose of her furosemide.  I did tell the family that if the patient worsens in any way over the weekend they should proceed to the emergency department or urgent care.  I also ordered CBC, metabolic panel, and urinalysis with reflex to culture.  Unfortunately the patient was difficult to get blood from so phlebotomy is only able to collect blood for metabolic panel, and patient is unable to urinate in the office today.  I have given the family a urinary hat and a cup so that if they are able to get a urine sample at home they will drop it by  the office today before 5 PM.  If not they will try to bring it to the [facility] across the street from [facility].  For now the patient is told to continue her nitrofurantoin.

## 2019-12-01 NOTE — Patient Instructions (Signed)
Thank you for choosing [facility] as your medical provider! If you have any questions or concerns regarding your health care, please do not hesitate to call our office.  Continue to hold all blood pressure medications and Lasix.  Please encourage her to drink water regularly throughout the day.  Please keep monitoring her heart rate and blood pressure twice a day at home.  Once her blood pressure normalizes and is at least 130/70, you can restart her blood pressure medications and Lasix.  If you notice that her heart rate gets above 110 and stays there despite rechecking her heart rate after she takes a few deep breaths and rests, make sure that her blood pressure is at least 100/70 and you can administer a dose of her diltiazem.  This will help control her heart rate.  If she starts to swell, and her blood pressure is at least 100/70 you may give her a dose of her Lasix.  If she worsens over the weekend and starts to vomit or feels lethargic/unwell please proceed to the emergency department or urgent care.  Please follow-up as scheduled. We look forward to seeing you again soon!   At [facility] we value your feedback. You may receive a survey about your visit today. Please share your experience as we strive to create trusting relationships with our patients to provide genuine, compassionate, quality care.  We appreciate your understanding and patience as we review any laboratory studies, imaging, and other diagnostic tests that are ordered as we care for you. We do our best to address any and all results in a timely manner. If you do not hear about test results within 1 week, please do not hesitate to contact us. If we referred you to a specialist during your visit or ordered imaging testing, contact the office if you have not been contacted to be scheduled within 1 weeks.  We also encourage the use of MyChart, which contains your medical information for your review as well. If you  are not enrolled in this feature, an access code is on this after visit summary for your convenience. Thank you for allowing [place] to be involved in your care.

## 2019-12-01 NOTE — Progress Notes (Signed)
Subjective:  Patient ID: Sara Garcia, female    DOB: 1928/01/10  Age: 86 y.o. MRN: [id number]  CC:  Chief Complaint  Patient presents with  . low blood pressure  . having tremors  . Nausea    started Saturday      HPI  This patient arrives today for an acute visit for the above.  I have completed a televisit with this patient earlier today at which time I recommended that she be evaluated emergently due to symptoms of borderline low blood pressure, elevated heart rate, and lethargy.  The patient is accompanied by her daughter and son.  They tell me EMS came out and evaluated her and decided that she did not need to be seen in the emergency department but should be seen by her primary care provider.  They now arrived today for an in person visit.  Approximately 4 days ago the patient experienced nausea and vomiting and was evaluated at urgent care.  At that time she was started on antibiotics empirically for urinary tract infection because patient was unable to provide a urine sample in the urgent care.  They also told the family to hold the patient's blood pressure medication because her blood pressure was low.  Since that time, the patient's symptoms have improved mildly.  She is not vomiting but does remain nauseated.  The patient seems to be tolerating her nitrofurantoin well.  The daughter is concerned because the patient's blood pressure remains on the low side.  The daughter also reports that the patient did have a episode at home where her heart rate went up to 157 bpm.  The patient does have a history of atrial fibrillation.  The daughter also tells me that the patient has had bilateral hand tremors for multiple weeks now.  The patient denies worsening weakness in her hands or arms.  She denies any sensation changes.  It sounds like the tremors are worse when the patient is at rest.   Past Medical History:  Diagnosis Date  . 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 Arthritis   . Asthma    . DVT (deep venous thrombosis) ([place]) 2011/2012  . Esophageal dysphagia    limited food and pill  . Esophageal reflux   . Essential hypertension, benign   . GERD (gastroesophageal reflux disease)   . Osteoporosis   . Overactive bladder   . Paroxysmal atrial fibrillation (HCC)    a. on Xarelto for anticoagulation  . 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 Stroke ([place])   . Unspecified prolapse of vaginal walls       Family History  Problem Relation Age of Onset  . Heart disease Mother   . Stroke Mother   . Emphysema Father   . COPD Sister   . Liver disease Brother   . Colon polyps Neg Hx   . Colon cancer Neg Hx     Social History   Social History Narrative   Married for 21 years in second marriage.First marriage lasted 28 years.Lives with husband.   Social History   Tobacco Use  . Smoking status: Never Smoker  . Smokeless tobacco: Never Used  Substance Use Topics  . Alcohol use: No    Alcohol/week: 0.0 standard drinks     Current Meds  Medication Sig  . acetaminophen (TYLENOL) 650 MG CR tablet Take 650 mg by mouth every 8 (eight) hours as needed for pain.  . cholecalciferol (VITAMIN D) 1000 units tablet Take 1,000  Units by mouth 3 (three) times daily.  . DULoxetine (CYMBALTA) 60 MG capsule TAKE 1 CAPSULE BY MOUTH TWICE DAILY  . esomeprazole (NEXIUM) 20 MG capsule TAKE 1 CAPSULE BY MOUTH ONCE DAILY BEFORE BREAKFAST  . Fluticasone-Salmeterol (ADVAIR) 250-50 MCG/DOSE AEPB Inhale 1 puff into the lungs daily.   . metolazone (ZAROXOLYN) 2.5 MG tablet Take 1 tablet (2.5 mg total) by mouth daily.  . nitrofurantoin, macrocrystal-monohydrate, (MACROBID) 100 MG capsule Take 100 mg by mouth 2 (two) times daily.  . NP THYROID 60 MG tablet TAKE 1 TABLET BY MOUTH DAILY  . Rivaroxaban (XARELTO) 15 MG TABS tablet Take 1 tablet (15 mg total) by mouth daily with supper.  . vitamin B-12 (CYANOCOBALAMIN) 1000 MCG tablet Take 1,000 mcg by mouth daily.   . [DISCONTINUED] Iron-FA-B  Cmp-C-Biot-Probiotic (FUSION PLUS) CAPS Take 1 capsule by mouth daily.  . [DISCONTINUED] levofloxacin (LEVAQUIN) 500 MG tablet Take 1 tablet (500 mg total) by mouth daily.    ROS:  Review of Systems  Constitutional: Negative for fever (Patient does take Tylenol daily for treatment of arthritis) and malaise/fatigue.  Respiratory: Negative for cough, sputum production, shortness of breath and wheezing.   Cardiovascular: Negative for chest pain (one episode of tachycardia at home) and palpitations.  Neurological: Positive for tremors. Negative for sensory change and weakness.     Objective:   Today's Vitals: BP 111/63   Pulse 78   Temp 98.1 F (36.7 C) (Temporal)   Ht 5' 2"  (1.575 m)   SpO2 97%   BMI 27.29 kg/m  Vitals with BMI 12/01/2019 12/01/2019 12/01/2019  Height - 5' 2"  -  Weight - - -  BMI - - -  Systolic 537 94 943  Diastolic 63 64 47  Pulse - 78 96     Physical Exam Vitals reviewed.  Constitutional:      General: She is not in acute distress.    Appearance: Normal appearance.  HENT:     Head: Normocephalic and atraumatic.  Neck:     Vascular: No carotid bruit.  Cardiovascular:     Rate and Rhythm: Normal rate and regular rhythm.     Pulses: Normal pulses.     Heart sounds: Murmur present.  Pulmonary:     Effort: Pulmonary effort is normal.     Breath sounds: Normal breath sounds.  Skin:    General: Skin is warm and dry.  Neurological:     Mental Status: She is alert and oriented to person, place, and time.     Sensory: No sensory deficit.     Motor: Weakness (bilateral upper extremities. 4/5 strength) and tremor (bilateral upper extremity; looks similar to pill rolling tremor; does not appear consistent with ataxia) present.  Psychiatric:        Mood and Affect: Mood normal.        Behavior: Behavior normal.        Judgment: Judgment normal.          Assessment   1. Hypotension, unspecified hypotension type   2. Atrial fibrillation, unspecified  type ([place])   3. Tremor       Tests ordered Orders Placed This Encounter  Procedures  . CBC with Differential/Platelets  . CMP with eGFR([facility])  . Urinalysis with Culture Reflex  . Ambulatory referral to Neurology     Plan: Please see assessment and plan per problem list below.   Meds ordered this encounter  Medications  . diltiazem (CARDIZEM SR) 60 MG 12 hr capsule  Sig: Take 1 capsule (60 mg total) by mouth 2 (two) times daily.    Dispense:  180 capsule    Refill:  0    Order Specific Question:   Supervising Provider    Answer:   Doree Albee [[id number]]    Patient to follow-up in 2 weeks  Ailene Ards, NP

## 2019-12-01 NOTE — Assessment & Plan Note (Signed)
I will refer patient to neurology for further evaluation of her tremor.

## 2019-12-01 NOTE — Assessment & Plan Note (Signed)
See plan under hypotension for further detail.  Change diltiazem from 60 mg 3 times daily to 60 mg 12-hour sustained release that she will take twice daily.  This is due to the fact that the son reports the patient forgets to take her midday dose of her diltiazem.

## 2019-12-02 ENCOUNTER — Inpatient Hospital Stay (HOSPITAL_COMMUNITY)
Admission: EM | Admit: 2019-12-02 | Discharge: 2019-12-04 | DRG: 641 | Disposition: A | Discharge status: Home / Self Care | Payer: Medicare HMO | Attending: Internal Medicine | Admitting: Internal Medicine

## 2019-12-02 ENCOUNTER — Telehealth (INDEPENDENT_AMBULATORY_CARE_PROVIDER_SITE_OTHER): Payer: Self-pay | Admitting: Nurse Practitioner

## 2019-12-02 ENCOUNTER — Encounter (HOSPITAL_COMMUNITY): Payer: Self-pay | Admitting: Emergency Medicine

## 2019-12-02 ENCOUNTER — Emergency Department (HOSPITAL_COMMUNITY): Payer: Medicare HMO

## 2019-12-02 DIAGNOSIS — Z91048 Other nonmedicinal substance allergy status: Secondary | ICD-10-CM

## 2019-12-02 DIAGNOSIS — Z66 Do not resuscitate: Secondary | ICD-10-CM | POA: Diagnosis present

## 2019-12-02 DIAGNOSIS — E860 Dehydration: Secondary | ICD-10-CM | POA: Diagnosis not present

## 2019-12-02 DIAGNOSIS — Z91040 Latex allergy status: Secondary | ICD-10-CM

## 2019-12-02 DIAGNOSIS — Z888 Allergy status to other drugs, medicaments and biological substances status: Secondary | ICD-10-CM

## 2019-12-02 DIAGNOSIS — Z7901 Long term (current) use of anticoagulants: Secondary | ICD-10-CM

## 2019-12-02 DIAGNOSIS — I4891 Unspecified atrial fibrillation: Secondary | ICD-10-CM | POA: Diagnosis present

## 2019-12-02 DIAGNOSIS — K219 Gastro-esophageal reflux disease without esophagitis: Secondary | ICD-10-CM

## 2019-12-02 DIAGNOSIS — R410 Disorientation, unspecified: Secondary | ICD-10-CM

## 2019-12-02 DIAGNOSIS — E876 Hypokalemia: Secondary | ICD-10-CM | POA: Diagnosis not present

## 2019-12-02 DIAGNOSIS — J449 Chronic obstructive pulmonary disease, unspecified: Secondary | ICD-10-CM | POA: Diagnosis present

## 2019-12-02 DIAGNOSIS — Z885 Allergy status to narcotic agent status: Secondary | ICD-10-CM

## 2019-12-02 DIAGNOSIS — I5032 Chronic diastolic (congestive) heart failure: Secondary | ICD-10-CM | POA: Diagnosis present

## 2019-12-02 DIAGNOSIS — Z20822 Contact with and (suspected) exposure to covid-19: Secondary | ICD-10-CM | POA: Diagnosis present

## 2019-12-02 DIAGNOSIS — Z882 Allergy status to sulfonamides status: Secondary | ICD-10-CM

## 2019-12-02 DIAGNOSIS — I480 Paroxysmal atrial fibrillation: Secondary | ICD-10-CM

## 2019-12-02 DIAGNOSIS — Z8673 Personal history of transient ischemic attack (TIA), and cerebral infarction without residual deficits: Secondary | ICD-10-CM

## 2019-12-02 DIAGNOSIS — M1990 Unspecified osteoarthritis, unspecified site: Secondary | ICD-10-CM | POA: Diagnosis present

## 2019-12-02 DIAGNOSIS — I130 Hypertensive heart and chronic kidney disease with heart failure and stage 1 through stage 4 chronic kidney disease, or unspecified chronic kidney disease: Secondary | ICD-10-CM | POA: Diagnosis present

## 2019-12-02 DIAGNOSIS — N390 Urinary tract infection, site not specified: Secondary | ICD-10-CM | POA: Diagnosis not present

## 2019-12-02 DIAGNOSIS — N1832 Chronic kidney disease, stage 3b: Secondary | ICD-10-CM

## 2019-12-02 DIAGNOSIS — E874 Mixed disorder of acid-base balance: Secondary | ICD-10-CM | POA: Diagnosis present

## 2019-12-02 DIAGNOSIS — N289 Disorder of kidney and ureter, unspecified: Secondary | ICD-10-CM

## 2019-12-02 DIAGNOSIS — Z86718 Personal history of other venous thrombosis and embolism: Secondary | ICD-10-CM

## 2019-12-02 DIAGNOSIS — N179 Acute kidney failure, unspecified: Secondary | ICD-10-CM | POA: Diagnosis not present

## 2019-12-02 DIAGNOSIS — Z880 Allergy status to penicillin: Secondary | ICD-10-CM

## 2019-12-02 DIAGNOSIS — R110 Nausea: Secondary | ICD-10-CM

## 2019-12-02 DIAGNOSIS — M810 Age-related osteoporosis without current pathological fracture: Secondary | ICD-10-CM | POA: Diagnosis present

## 2019-12-02 DIAGNOSIS — Z8249 Family history of ischemic heart disease and other diseases of the circulatory system: Secondary | ICD-10-CM

## 2019-12-02 DIAGNOSIS — E873 Alkalosis: Secondary | ICD-10-CM

## 2019-12-02 LAB — BASIC METABOLIC PANEL
Anion gap: 18 — ABNORMAL HIGH (ref 5–15)
BUN: 38 mg/dL — ABNORMAL HIGH (ref 8–23)
CO2: 45 mmol/L — ABNORMAL HIGH (ref 22–32)
Calcium: 8.8 mg/dL — ABNORMAL LOW (ref 8.9–10.3)
Chloride: 76 mmol/L — ABNORMAL LOW (ref 98–111)
Creatinine, Ser: 1.75 mg/dL — ABNORMAL HIGH (ref 0.44–1.00)
GFR calc Af Amer: 29 mL/min — ABNORMAL LOW (ref >60)
GFR calc non Af Amer: 25 mL/min — ABNORMAL LOW (ref >60)
Glucose, Bld: 143 mg/dL — ABNORMAL HIGH (ref 70–99)
Potassium: 2.1 mmol/L — CL (ref 3.5–5.1)
Sodium: 139 mmol/L (ref 135–145)

## 2019-12-02 LAB — CBC WITH DIFFERENTIAL/PLATELET
Abs Immature Granulocytes: 0.03 10*3/uL (ref 0.00–0.07)
Basophils Absolute: 0.1 10*3/uL (ref 0.0–0.1)
Basophils Relative: 1 %
Eosinophils Absolute: 0.2 10*3/uL (ref 0.0–0.5)
Eosinophils Relative: 2 %
HCT: 42.6 % (ref 36.0–46.0)
Hemoglobin: 13.7 g/dL (ref 12.0–15.0)
Immature Granulocytes: 0 %
Lymphocytes Relative: 23 %
Lymphs Abs: 2.1 10*3/uL (ref 0.7–4.0)
MCH: 31.9 pg (ref 26.0–34.0)
MCHC: 32.2 g/dL (ref 30.0–36.0)
MCV: 99.1 fL (ref 80.0–100.0)
Monocytes Absolute: 1 10*3/uL (ref 0.1–1.0)
Monocytes Relative: 11 %
Neutro Abs: 5.7 10*3/uL (ref 1.7–7.7)
Neutrophils Relative %: 63 %
Platelets: 240 10*3/uL (ref 150–400)
RBC: 4.3 MIL/uL (ref 3.87–5.11)
RDW: 13.9 % (ref 11.5–15.5)
WBC: 9.1 10*3/uL (ref 4.0–10.5)
nRBC: 0 % (ref 0.0–0.2)

## 2019-12-02 LAB — COMPLETE METABOLIC PANEL WITH GFR
AG Ratio: 1.3 (calc) (ref 1.0–2.5)
ALT: 30 U/L — ABNORMAL HIGH (ref 6–29)
AST: 49 U/L — ABNORMAL HIGH (ref 10–35)
Albumin: 3.6 g/dL (ref 3.6–5.1)
Alkaline phosphatase (APISO): 75 U/L (ref 37–153)
BUN/Creatinine Ratio: 23 (calc) — ABNORMAL HIGH (ref 6–22)
BUN: 35 mg/dL — ABNORMAL HIGH (ref 7–25)
CO2: 40 mmol/L — ABNORMAL HIGH (ref 20–32)
Calcium: 9.1 mg/dL (ref 8.6–10.4)
Chloride: 81 mmol/L — ABNORMAL LOW (ref 98–110)
Creat: 1.54 mg/dL — ABNORMAL HIGH (ref 0.60–0.88)
GFR, Est African American: 34 mL/min/{1.73_m2} — ABNORMAL LOW (ref >=60)
GFR, Est Non African American: 30 mL/min/{1.73_m2} — ABNORMAL LOW (ref >=60)
Globulin: 2.7 g/dL (calc) (ref 1.9–3.7)
Glucose, Bld: 167 mg/dL — ABNORMAL HIGH (ref 65–99)
Potassium: 2.4 mmol/L — CL (ref 3.5–5.3)
Sodium: 139 mmol/L (ref 135–146)
Total Bilirubin: 2 mg/dL — ABNORMAL HIGH (ref 0.2–1.2)
Total Protein: 6.3 g/dL (ref 6.1–8.1)

## 2019-12-02 LAB — RESPIRATORY PANEL BY RT PCR (FLU A&B, COVID)
Influenza A by PCR: NEGATIVE
Influenza B by PCR: NEGATIVE
SARS Coronavirus 2 by RT PCR: NEGATIVE

## 2019-12-02 LAB — VITAMIN B12: Vitamin B-12: 2645 pg/mL — ABNORMAL HIGH (ref 180–914)

## 2019-12-02 LAB — MAGNESIUM: Magnesium: 2.2 mg/dL (ref 1.7–2.4)

## 2019-12-02 LAB — TSH: TSH: 0.655 u[IU]/mL (ref 0.350–4.500)

## 2019-12-02 MED ORDER — SODIUM CHLORIDE 0.9 % IV BOLUS
500.0000 mL | Freq: Once | INTRAVENOUS | Status: AC
  Administered 2019-12-02: 500 mL via INTRAVENOUS

## 2019-12-02 MED ORDER — DILTIAZEM HCL ER 60 MG PO CP12
60.0000 mg | ORAL_CAPSULE | Freq: Two times a day (BID) | ORAL | Status: DC
  Administered 2019-12-02 – 2019-12-04 (×5): 60 mg via ORAL
  Filled 2019-12-02 (×9): qty 1

## 2019-12-02 MED ORDER — POTASSIUM CHLORIDE 10 MEQ/100ML IV SOLN
10.0000 meq | INTRAVENOUS | Status: AC
  Administered 2019-12-02 (×4): 10 meq via INTRAVENOUS
  Filled 2019-12-02 (×4): qty 100

## 2019-12-02 MED ORDER — ORAL CARE MOUTH RINSE
15.0000 mL | Freq: Two times a day (BID) | OROMUCOSAL | Status: DC
  Administered 2019-12-02 – 2019-12-04 (×4): 15 mL via OROMUCOSAL

## 2019-12-02 MED ORDER — DULOXETINE HCL 60 MG PO CPEP
60.0000 mg | ORAL_CAPSULE | Freq: Two times a day (BID) | ORAL | Status: DC
  Administered 2019-12-02 – 2019-12-04 (×4): 60 mg via ORAL
  Filled 2019-12-02 (×5): qty 1

## 2019-12-02 MED ORDER — ACETAMINOPHEN 325 MG PO TABS
650.0000 mg | ORAL_TABLET | Freq: Once | ORAL | Status: AC
  Administered 2019-12-02: 650 mg via ORAL
  Filled 2019-12-02: qty 2

## 2019-12-02 MED ORDER — SODIUM CHLORIDE 0.9 % IV SOLN
1.0000 g | INTRAVENOUS | Status: DC
  Administered 2019-12-02 – 2019-12-03 (×2): 1 g via INTRAVENOUS
  Filled 2019-12-02 (×3): qty 10

## 2019-12-02 MED ORDER — POTASSIUM CHLORIDE CRYS ER 20 MEQ PO TBCR
20.0000 meq | EXTENDED_RELEASE_TABLET | Freq: Once | ORAL | Status: AC
  Administered 2019-12-02: 20 meq via ORAL
  Filled 2019-12-02: qty 1

## 2019-12-02 MED ORDER — PANTOPRAZOLE SODIUM 40 MG PO TBEC
40.0000 mg | DELAYED_RELEASE_TABLET | Freq: Every day | ORAL | Status: DC
  Administered 2019-12-02 – 2019-12-04 (×3): 40 mg via ORAL
  Filled 2019-12-02 (×3): qty 1

## 2019-12-02 MED ORDER — NITROFURANTOIN MONOHYD MACRO 100 MG PO CAPS: 100.0000 mg | ORAL_CAPSULE | Freq: Two times a day (BID) | ORAL | Status: DC

## 2019-12-02 MED ORDER — VITAMIN B-12 1000 MCG PO TABS
1000.0000 ug | ORAL_TABLET | Freq: Every day | ORAL | Status: DC
  Administered 2019-12-02 – 2019-12-04 (×3): 1000 ug via ORAL
  Filled 2019-12-02 (×5): qty 1

## 2019-12-02 MED ORDER — ONDANSETRON HCL 4 MG/2ML IJ SOLN: 4.0000 mg | Freq: Four times a day (QID) | INTRAMUSCULAR | Status: DC | PRN

## 2019-12-02 MED ORDER — POTASSIUM CHLORIDE 20 MEQ PO PACK
40.0000 meq | PACK | Freq: Once | ORAL | Status: AC
  Administered 2019-12-02: 40 meq via ORAL
  Filled 2019-12-02: qty 2

## 2019-12-02 MED ORDER — FLUTICASONE FUROATE-VILANTEROL 200-25 MCG/INH IN AEPB
1.0000 | INHALATION_SPRAY | Freq: Every day | RESPIRATORY_TRACT | Status: DC
  Administered 2019-12-04: 1 via RESPIRATORY_TRACT

## 2019-12-02 MED ORDER — POTASSIUM CHLORIDE IN NACL 40-0.9 MEQ/L-% IV SOLN
INTRAVENOUS | Status: DC
  Administered 2019-12-02 – 2019-12-03 (×2): 75 mL/h via INTRAVENOUS
  Filled 2019-12-02 (×3): qty 1000

## 2019-12-02 MED ORDER — ONDANSETRON HCL 4 MG PO TABS: 4.0000 mg | ORAL_TABLET | Freq: Four times a day (QID) | ORAL | Status: DC | PRN

## 2019-12-02 MED ORDER — SODIUM CHLORIDE 0.9 % IV BOLUS: 500.0000 mL | Freq: Once | INTRAVENOUS | Status: DC

## 2019-12-02 MED ORDER — THYROID 30 MG PO TABS
60.0000 mg | ORAL_TABLET | Freq: Every day | ORAL | Status: DC
  Administered 2019-12-03 – 2019-12-04 (×2): 60 mg via ORAL
  Filled 2019-12-02 (×4): qty 2

## 2019-12-02 MED ORDER — RIVAROXABAN 15 MG PO TABS
15.0000 mg | ORAL_TABLET | Freq: Every day | ORAL | Status: DC
  Administered 2019-12-02 – 2019-12-03 (×2): 15 mg via ORAL
  Filled 2019-12-02 (×3): qty 1

## 2019-12-02 MED ORDER — ACETAMINOPHEN 325 MG PO TABS
650.0000 mg | ORAL_TABLET | Freq: Three times a day (TID) | ORAL | Status: DC | PRN
  Administered 2019-12-03 – 2019-12-04 (×2): 650 mg via ORAL
  Filled 2019-12-02 (×2): qty 2

## 2019-12-02 MED ORDER — SODIUM CHLORIDE 0.9 % IV SOLN: INTRAVENOUS | Status: DC

## 2019-12-02 NOTE — ED Provider Notes (Addendum)
[facility] EMERGENCY DEPARTMENT Provider Note   CSN: [id number] Arrival date & time: 12/02/19  [id number]   Time seen 5:40 AM  History Chief Complaint  Patient presents with  . Abnormal Lab   Level 5 caveat for age  Sara Garcia is a 86 y.o. female.  HPI   Patient's son is here with his mother.  He states for the past 2 weeks she has had problems with nausea and dry heaves.  He states she did have decreased appetite but that has been improving the last couple of days.  She also has been having problems with her blood pressure being too low, under [id number] systolic.  He states she has been having memory problems like not remembering the day of the week they are not remembering if she took her medications.  They were seen by their PCP yesterday and had blood work done and they were called this morning to have her come to the emergency department for potassium of 2.4.  She also was started on Macrobid yesterday for possible UTI, culture has been sent and is pending.  Son also states that they had put her on fluid pills for "plebitis" Lasix 40 mg a day a couple months ago and on December 30 they also started on metolazone 2.5 mg  which they just switched to being from daily to every other day.  They had told him to stop her diltiazem and her metoprolol until her blood pressure improved.  Patient is also on Xarelto.  Patient denies abdominal pain, or feeling weaker.  She does complain of muscle cramping.  She has a chronic cough that she takes Advair for.  They deny fever, chest pain, or shortness of breath.  She has a history of irregular heartbeat but she does not feel it now.  Son states that she cannot do a lot of things now anyway.  PCP Doree Albee, MD   Past Medical History:  Diagnosis Date  . 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 Arthritis   . Asthma   . DVT (deep venous thrombosis) ([place]) 2011/2012  . Esophageal dysphagia    limited food and pill  . Esophageal reflux   . Essential hypertension,  benign   . GERD (gastroesophageal reflux disease)   . Osteoporosis   . Overactive bladder   . Paroxysmal atrial fibrillation (HCC)    a. on Xarelto for anticoagulation  . 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 Stroke ([place])   . Unspecified prolapse of vaginal walls     Patient Active Problem List   Diagnosis Date Noted  . Tremor 12/01/2019  . Hypotension 12/01/2019  . Heme positive stool 01/27/2019  . Pseudoaneurysm ([place]) 09/18/2017  . Acute CVA (cerebrovascular accident) ([place]) 07/23/2017  . Dysarthria 07/21/2017  . Tachycardia 07/21/2017  . Closed comminuted intertrochanteric fracture of left femur ([place]) 06/11/2017  . Diarrhea 01/03/2016  . IDA (iron deficiency anemia) 11/09/2015  . Dysphagia, pharyngoesophageal phase   . Hiatal hernia   . Constipation 01/16/2015  . Esophageal dysphagia 06/27/2014  . GERD (gastroesophageal reflux disease) 06/27/2014  . Personal history of colonic polyps 06/27/2014  . Chronic anticoagulation 06/27/2014  . Primary osteoarthritis of right knee 05/09/2014  . DISH (diffuse idiopathic skeletal hyperostosis) 05/09/2014  . DDD (degenerative disc disease), lumbosacral 05/09/2014  . Prolapse of vaginal vault after hysterectomy 04/07/2014  . Atrial fibrillation ([place]) 01/10/2014  . Radiculopathy 01/10/2014  . Essential hypertension 12/21/2013    Past Surgical History:  Procedure Laterality Date  .  ABDOMINAL HYSTERECTOMY    . COLONOSCOPY W/ BIOPSIES  02/01/2004   RMR: Anal papilla with normal rectum/Sigmoid diverticula/Small polyps in the cecum removed as described above. Inflammatory polyp  . ESOPHAGEAL DILATION     unknown year per patient.  . ESOPHAGEAL DILATION N/A 02/07/2015   RMR: Normal esophagus status post maloney dilation. Small hiatal hernia.   Marland Kitchen ESOPHAGOGASTRODUODENOSCOPY     unknown year per patient  . ESOPHAGOGASTRODUODENOSCOPY N/A 02/07/2015   RMR: Normal esophagus  status post  Maloney dilation. Small hiatal hernia  . FALSE ANEURYSM  REPAIR Left 09/23/2017   Procedure: REPAIR FALSE ANEURYSM COMMON LEFT FEMORAL ARTERY WITH OSTEOTOMY OF BONE FRAGEMENT;  Surgeon: Angelia Mould, MD;  Location: [facility];  Service: Vascular;  Laterality: Left;  . FEMUR IM NAIL Left 06/11/2017   Procedure: INTRAMEDULLARY (IM) NAIL FEMORAL;  Surgeon: Rod Can, MD;  Location: [facility];  Service: Orthopedics;  Laterality: Left;  . LEG SURGERY Left   . NASAL SEPTUM SURGERY    . TONSILLECTOMY    . WRIST FRACTURE SURGERY       OB History    Gravida  3   Para  3   Term  3   Preterm      AB      Living  3     SAB      TAB      Ectopic      Multiple      Live Births  3           Family History  Problem Relation Age of Onset  . Heart disease Mother   . Stroke Mother   . Emphysema Father   . COPD Sister   . Liver disease Brother   . Colon polyps Neg Hx   . Colon cancer Neg Hx     Social History   Tobacco Use  . Smoking status: Never Smoker  . Smokeless tobacco: Never Used  Substance Use Topics  . Alcohol use: No    Alcohol/week: 0.0 standard drinks  . Drug use: No  Lives at home Lives with spouse  Home Medications Prior to Admission medications   Medication Sig Start Date End Date Taking? Authorizing Provider  acetaminophen (TYLENOL) 650 MG CR tablet Take 650 mg by mouth every 8 (eight) hours as needed for pain.    [provider]  cholecalciferol (VITAMIN D) 1000 units tablet Take 1,000 Units by mouth 3 (three) times daily.    [provider]  diltiazem (CARDIZEM SR) 60 MG 12 hr capsule Take 1 capsule (60 mg total) by mouth 2 (two) times daily. 12/01/19   Ailene Ards, NP  DULoxetine (CYMBALTA) 60 MG capsule TAKE 1 CAPSULE BY MOUTH TWICE DAILY 09/07/19   Hurshel Party C, MD  esomeprazole (NEXIUM) 20 MG capsule TAKE 1 CAPSULE BY MOUTH ONCE DAILY BEFORE BREAKFAST 02/01/19   Carlis Stable, NP  Fluticasone-Salmeterol (ADVAIR) 250-50 MCG/DOSE AEPB Inhale 1 puff into the lungs daily.      [provider]  furosemide (LASIX) 40 MG tablet Take 1 tablet (40 mg total) by mouth 2 (two) times daily. Patient not taking: Reported on 12/01/2019 09/28/19   Doree Albee, MD  metolazone (ZAROXOLYN) 2.5 MG tablet Take 1 tablet (2.5 mg total) by mouth daily. 10/13/19   Doree Albee, MD  metoprolol tartrate (LOPRESSOR) 25 MG tablet TAKE 1 AND 1/2 TABLETS BY MOUTH TWICE DAILY Patient not taking: Reported on 12/01/2019 07/14/19   Carlyle Dolly  F, MD  nitrofurantoin, macrocrystal-monohydrate, (MACROBID) 100 MG capsule Take 100 mg by mouth 2 (two) times daily.    [provider]  NP THYROID 60 MG tablet TAKE 1 TABLET BY MOUTH DAILY 09/07/19   Doree Albee, MD  Rivaroxaban (XARELTO) 15 MG TABS tablet Take 1 tablet (15 mg total) by mouth daily with supper. 11/10/19   Arnoldo Lenis, MD  vitamin B-12 (CYANOCOBALAMIN) 1000 MCG tablet Take 1,000 mcg by mouth daily.     [provider]  DULoxetine (CYMBALTA) 60 MG capsule Take 60 mg by mouth 2 (two) times daily.     [provider]  NP THYROID 60 MG tablet Take 60 mg by mouth daily before breakfast.    [provider]    Allergies    Latex, Adhesive [tape], Betadine [povidone iodine], Ciprofloxacin, Codeine, Penicillins, Sulfa antibiotics, Wellbutrin [bupropion], and Aloe  Review of Systems   Review of Systems  Unable to perform ROS: Age    Physical Exam Updated Vital Signs BP 120/66   Pulse 92   Temp (!) 97.4 F (36.3 C) (Oral)   Resp (!) 25   Wt 67.7 kg   SpO2 (!) 89%   BMI 27.30 kg/m   Physical Exam Vitals and nursing note reviewed.  Constitutional:      Comments: Frail elderly female  HENT:     Head: Normocephalic and atraumatic.     Nose: Nose normal.     Mouth/Throat:     Mouth: Mucous membranes are dry.  Eyes:     Extraocular Movements: Extraocular movements intact.     Conjunctiva/sclera: Conjunctivae normal.     Pupils: Pupils are equal, round, and reactive  to light.  Cardiovascular:     Rate and Rhythm: Normal rate. Rhythm irregular.  Pulmonary:     Effort: Pulmonary effort is normal. No respiratory distress.     Breath sounds: Normal breath sounds.  Musculoskeletal:        General: No swelling.     Cervical back: Normal range of motion.     Right lower leg: No edema.     Left lower leg: No edema.  Skin:    General: Skin is warm and dry.     Coloration: Skin is pale.  Neurological:     General: No focal deficit present.     Cranial Nerves: No cranial nerve deficit.  Psychiatric:        Mood and Affect: Mood normal.        Behavior: Behavior normal.        Thought Content: Thought content normal.     ED Results / Procedures / Treatments   Labs (all labs ordered are listed, but only abnormal results are displayed) Results for orders placed or performed during the hospital encounter of 12/02/19  Magnesium  Result Value Ref Range   Magnesium 2.2 1.7 - 2.4 mg/dL  Basic metabolic panel  Result Value Ref Range   Sodium 139 135 - 145 mmol/L   Potassium 2.1 (LL) 3.5 - 5.1 mmol/L   Chloride 76 (L) 98 - 111 mmol/L   CO2 45 (H) 22 - 32 mmol/L   Glucose, Bld 143 (H) 70 - 99 mg/dL   BUN 38 (H) 8 - 23 mg/dL   Creatinine, Ser 1.75 (H) 0.44 - 1.00 mg/dL   Calcium 8.8 (L) 8.9 - 10.3 mg/dL   GFR calc non Af Amer 25 (L) >60 mL/min   GFR calc Af Amer 29 (L) >60 mL/min  Anion gap 18 (H) 5 - 15  CBC with Differential  Result Value Ref Range   WBC 9.1 4.0 - 10.5 K/uL   RBC 4.30 3.87 - 5.11 MIL/uL   Hemoglobin 13.7 12.0 - 15.0 g/dL   HCT 42.6 36.0 - 46.0 %   MCV 99.1 80.0 - 100.0 fL   MCH 31.9 26.0 - 34.0 pg   MCHC 32.2 30.0 - 36.0 g/dL   RDW 13.9 11.5 - 15.5 %   Platelets 240 150 - 400 K/uL   nRBC 0.0 0.0 - 0.2 %   Neutrophils Relative % 63 %   Neutro Abs 5.7 1.7 - 7.7 K/uL   Lymphocytes Relative 23 %   Lymphs Abs 2.1 0.7 - 4.0 K/uL   Monocytes Relative 11 %   Monocytes Absolute 1.0 0.1 - 1.0 K/uL   Eosinophils Relative 2 %    Eosinophils Absolute 0.2 0.0 - 0.5 K/uL   Basophils Relative 1 %   Basophils Absolute 0.1 0.0 - 0.1 K/uL   Immature Granulocytes 0 %   Abs Immature Granulocytes 0.03 0.00 - 0.07 K/uL   Laboratory interpretation all normal except profound hypokalemia, renal insufficiency (3 weeks ago her BUN was 19 and her creatinine was 1.02), her CO2 was 31 3 weeks ago, 40 yesterday and now up to 45 consistent with dehydration.  Patient has a positive anion gap.      EKG EKG Interpretation  Date/Time:  Thursday December 02 2019 05:31:45 EST Ventricular Rate:  94 PR Interval:    QRS Duration: 112 QT Interval:  361 QTC Calculation: 452 R Axis:   -77 Text Interpretation: Sinus tachycardia Multiple ventricular premature complexes LAD, consider left anterior fascicular block Borderline low voltage, extremity leads Consider anterior infarct Repol abnrm suggests ischemia, lateral leads Confirmed by Rolland Porter [phone]) on 12/02/2019 5:38:21 AM   Radiology CT Head Wo Contrast  Result Date: 12/02/2019 CLINICAL DATA:  Confusion and hypokalemia EXAM: CT HEAD WITHOUT CONTRAST TECHNIQUE: Contiguous axial images were obtained from the base of the skull through the vertex without intravenous contrast. COMPARISON:  Brain MRI 01/12/2018 FINDINGS: Brain: No evidence of acute infarction, hemorrhage, hydrocephalus, extra-axial collection or mass lesion/mass effect. Chronic small vessel ischemia in the cerebral white matter that is confluent with superimposed infarcts in the centrum semiovale. Age normal brain volume. Vascular: Atherosclerotic calcification. Skull: Negative Sinuses/Orbits: Negative IMPRESSION: 1. No acute finding. 2. Chronic small vessel ischemia. Electronically Signed   By: Monte Fantasia M.D.   On: 12/02/2019 06:57    Procedures .Critical Care Performed by: Rolland Porter, MD Authorized by: Rolland Porter, MD   Critical care provider statement:    Critical care time (minutes):  32   Critical care was  necessary to treat or prevent imminent or life-threatening deterioration of the following conditions:  Endocrine crisis   Critical care was time spent personally by me on the following activities:  Discussions with consultants, examination of patient, obtaining history from patient or surrogate, ordering and review of laboratory studies, ordering and review of radiographic studies, re-evaluation of patient's condition and review of old charts   (including critical care time)   CMP Latest Ref Rng & Units 12/02/2019 12/01/2019 11/08/2019  Glucose 70 - 99 mg/dL 143(H) 167(H) 106(H)  BUN 8 - 23 mg/dL 38(H) 35(H) 19  Creatinine 0.44 - 1.00 mg/dL 1.75(H) 1.54(H) 1.02(H)  Sodium 135 - 145 mmol/L 139 139 141  Potassium 3.5 - 5.1 mmol/L 2.1(LL) 2.4(LL) 3.5  Chloride 98 - 111 mmol/L 76(L) 81(L) 98  CO2 22 - 32 mmol/L 45(H) 40(H) 31  Calcium 8.9 - 10.3 mg/dL 8.8(L) 9.1 8.9  Total Protein 6.1 - 8.1 g/dL - 6.3 6.4  Total Bilirubin 0.2 - 1.2 mg/dL - 2.0(H) 0.8  Alkaline Phos 38 - 126 U/L - - -  AST 10 - 35 U/L - 49(H) 22  ALT 6 - 29 U/L - 30(H) 16    Urinalysis December 01, 2019  Specific gravity 1.014, 1+ ketones, 1+ leukocyte Estrace, 20-40 WBCs per high-powered field, 0-2 RBCs, 0-5 squamous cells   Urine culture July 24, 2017 Specimen Description URINE, CATHETERIZED   Special Requests NONE   Culture >=100,000 COLONIES/mL PROTEUS MIRABILISAbnormal    Report Status 07/24/2017 FINAL   Organism ID, Bacteria PROTEUS MIRABILISAbnormal    Resulting Agency [facility]  Susceptibility   Proteus mirabilis    MIC    AMPICILLIN <=2 SENSITIVE  Sensitive    AMPICILLIN/SULBACTAM <=2 SENSITIVE  Sensitive    CEFAZOLIN <=4 SENSITIVE  Sensitive    CEFTRIAXONE <=1 SENSITIVE  Sensitive    CIPROFLOXACIN <=0.25 SENS... Sensitive    GENTAMICIN <=1 SENSITIVE  Sensitive    IMIPENEM 4 SENSITIVE  Sensitive    NITROFURANTOIN 128 RESISTANT  Resistant    PIP/TAZO <=4 SENSITIVE  Sensitive    TRIMETH/SULFA <=20  SENSIT... Sensitive             Doppler ultrasound lower extremities September 01, 2019 IMPRESSION: No evidence of deep venous thrombosis in either lower extremity.   Electronically Signed   By: Jerilynn Mages.  Shick M.D.   On: 09/01/2019 11:56   Medications Ordered in ED Medications  potassium chloride 10 mEq in 100 mL IVPB (10 mEq Intravenous New Bag/Given 12/02/19 0720)  potassium chloride (KLOR-CON) packet 40 mEq (40 mEq Oral Given 12/02/19 0612)  sodium chloride 0.9 % bolus 500 mL (0 mLs Intravenous Stopped 12/02/19 0719)  acetaminophen (TYLENOL) tablet 650 mg (650 mg Oral Given 12/02/19 0719)    ED Course  I have reviewed the triage vital signs and the nursing notes.  Pertinent labs & imaging results that were available during my care of the patient were reviewed by me and considered in my medical decision making (see chart for details).    MDM Rules/Calculators/A&P                      Patient was placed on a monitor, she is having frequent ectopic beats.  This is most likely from her profound hypokalemia.  Patient was given some IV fluids because she does appear dehydrated, she was given potassium both orally and by IV.  When I look at her legs there is absolutely no edema now, son says her ankles were extremely swollen before.  He agrees that her legs now look back to normal.  Patient was prescribed Macrobid for UTI.  Her urine culture is still pending however when I look in a urine culture from 2018 she had 100,000 colonies of Proteus mirabilis that was sensitive to everything except nitrofurantoin.  I will suggest she be switched to Keflex until her culture results.  However when I start trying to order different antibiotic she is allergic to penicillins, sulfa, and Cipro.  Patient has had some altered mental status, she has had dry heaving without known head injury.  She is on Xarelto for atrial fibrillation, head CT was done.  06:58 AM pt c/o knee pain, son states she normally  takes 1000 mg of acetaminophen in the morning.  However  when I look at her medication list it is listed as 650 mg.  She was given 650 mg.  She is not having cramps in the calf muscles.  We are waiting for the results of her head CT.  07:20 AM Dr Dyann Kief, hospitalist, will admit  Final Clinical Impression(s) / ED Diagnoses Final diagnoses:  Nausea  Hypokalemia  Renal insufficiency  Dehydration  Confusion  Metabolic acidosis  Urinary tract infection without hematuria, site unspecified  Metabolic alkalosis    Rx / DC Orders  Plan admission  Rolland Porter, MD, Barbette Or, MD 12/02/19 [id number]    Rolland Porter, MD 12/20/19 [id number]

## 2019-12-02 NOTE — Telephone Encounter (Signed)
I was also able to contact the ER to notify them that I am sending this patient to them for potassium repletion.

## 2019-12-02 NOTE — Care Management Obs Status (Signed)
[place] NOTIFICATION   Patient Details  Name: Sara Garcia MRN: [id number] Date of Birth: 09-13-1928   Medicare Observation Status Notification Given:  Yes(Shannon, RN will deliver letter to patient's room)    Tommy Medal 12/02/2019, 4:31 PM

## 2019-12-02 NOTE — H&P (Signed)
History and Physical    CETERA TICER [id number] DOB: 1928-09-08 DOA: 12/02/2019  Referring MD/NP/PA: Dr. Tomi Bamberger PCP: Doree Albee, MD  Patient coming from: Home  Chief Complaint: Generalized weakness, increased frequency and abnormal blood work.  HPI: Sara Garcia is a 86 y.o. female with a past medical history significant for asthma/COPD, gastroesophageal flux disease, paroxysmal atrial fibrillation (chronically on Xarelto), CKD stage 3b, prior history of stroke without residual deficits, essential hypertension, chronic diastolic heart failure and hx of DVT; who presented to the hospital secondary to generalized weakness, vomiting, increasing frequency and abnormal blood work. Patient seen on 12/01/19 at her PCP office after having above mentioned symptoms for approx 4 days; severe hypokalemia and also with abnormal urinalysis suggesting UTI.  She was referred to the emergency department for further evaluation and management.  Patient also reported intermittent nausea and decreased oral intake. No focal weakness, no chest pain, no vomiting, no hematuria, no melena, no hematochezia, no other acute complaints.  The ED work-up demonstrated potassium of 2.1, metabolic alkalosis, worsening renal function and physical exam with concerns for dehydration (decrease skin turgor and dry MM).  Fluids initiated, electrolytes repletion started and [facility] contacted to admit patient for further evaluation and management.   Past Medical/Surgical History: Past Medical History:  Diagnosis Date  . 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 Arthritis   . Asthma   . DVT (deep venous thrombosis) ([place]) 2011/2012  . Esophageal dysphagia    limited food and pill  . Esophageal reflux   . Essential hypertension, benign   . GERD (gastroesophageal reflux disease)   . Osteoporosis   . Overactive bladder   . Paroxysmal atrial fibrillation ([facility])    a. on Xarelto for anticoagulation  . 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 Stroke  ([place])   . Unspecified prolapse of vaginal walls     Past Surgical History:  Procedure Laterality Date  . ABDOMINAL HYSTERECTOMY    . COLONOSCOPY W/ BIOPSIES  02/01/2004   RMR: Anal papilla with normal rectum/Sigmoid diverticula/Small polyps in the cecum removed as described above. Inflammatory polyp  . ESOPHAGEAL DILATION     unknown year per patient.  . ESOPHAGEAL DILATION N/A 02/07/2015   RMR: Normal esophagus status post maloney dilation. Small hiatal hernia.   Marland Kitchen ESOPHAGOGASTRODUODENOSCOPY     unknown year per patient  . ESOPHAGOGASTRODUODENOSCOPY N/A 02/07/2015   RMR: Normal esophagus  status post  Maloney dilation. Small hiatal hernia  . FALSE ANEURYSM REPAIR Left 09/23/2017   Procedure: REPAIR FALSE ANEURYSM COMMON LEFT FEMORAL ARTERY WITH OSTEOTOMY OF BONE FRAGEMENT;  Surgeon: Angelia Mould, MD;  Location: [facility];  Service: Vascular;  Laterality: Left;  . FEMUR IM NAIL Left 06/11/2017   Procedure: INTRAMEDULLARY (IM) NAIL FEMORAL;  Surgeon: Rod Can, MD;  Location: [facility];  Service: Orthopedics;  Laterality: Left;  . LEG SURGERY Left   . NASAL SEPTUM SURGERY    . TONSILLECTOMY    . WRIST FRACTURE SURGERY      Social History:  reports that she has never smoked. She has never used smokeless tobacco. She reports that she does not drink alcohol or use drugs.  Allergies: Allergies  Allergen Reactions  . Latex Itching and Rash  . Adhesive [Tape] Other (See Comments)    Tears skin  . Betadine [Povidone Iodine]     blisters  . Ciprofloxacin Nausea And Vomiting  . Codeine Nausea And Vomiting    Upsets stomach, nightmares  . Penicillins Hives  Has patient had a PCN reaction causing immediate rash, facial/tongue/throat swelling, SOB or lightheadedness with hypotension: yes Has patient had a PCN reaction causing severe rash involving mucus membranes or skin necrosis: yes Has patient had a PCN reaction that required hospitalization: no Has patient had a PCN  reaction occurring within the last 10 years: no If all of the above answers are "NO", then may proceed with Cephalosporin use.   . Sulfa Antibiotics Hives  . Wellbutrin [Bupropion]     Reaction unknown  . Aloe Rash    Family History:  Family History  Problem Relation Age of Onset  . Heart disease Mother   . Stroke Mother   . Emphysema Father   . COPD Sister   . Liver disease Brother   . Colon polyps Neg Hx   . Colon cancer Neg Hx     Prior to Admission medications   Medication Sig Start Date End Date Taking? Authorizing Provider  acetaminophen (TYLENOL) 650 MG CR tablet Take 650 mg by mouth every 8 (eight) hours as needed for pain.    [provider]  cholecalciferol (VITAMIN D) 1000 units tablet Take 1,000 Units by mouth 3 (three) times daily.    [provider]  diltiazem (CARDIZEM SR) 60 MG 12 hr capsule Take 1 capsule (60 mg total) by mouth 2 (two) times daily. 12/01/19   Ailene Ards, NP  DULoxetine (CYMBALTA) 60 MG capsule TAKE 1 CAPSULE BY MOUTH TWICE DAILY 09/07/19   Hurshel Party C, MD  esomeprazole (NEXIUM) 20 MG capsule TAKE 1 CAPSULE BY MOUTH ONCE DAILY BEFORE BREAKFAST 02/01/19   Carlis Stable, NP  Fluticasone-Salmeterol (ADVAIR) 250-50 MCG/DOSE AEPB Inhale 1 puff into the lungs daily.     [provider]  furosemide (LASIX) 40 MG tablet Take 1 tablet (40 mg total) by mouth 2 (two) times daily. Patient not taking: Reported on 12/01/2019 09/28/19   Doree Albee, MD  metolazone (ZAROXOLYN) 2.5 MG tablet Take 1 tablet (2.5 mg total) by mouth daily. 10/13/19   Doree Albee, MD  metoprolol tartrate (LOPRESSOR) 25 MG tablet TAKE 1 AND 1/2 TABLETS BY MOUTH TWICE DAILY Patient not taking: Reported on 12/01/2019 07/14/19   Arnoldo Lenis, MD  nitrofurantoin, macrocrystal-monohydrate, (MACROBID) 100 MG capsule Take 100 mg by mouth 2 (two) times daily.    [provider]  NP THYROID 60 MG tablet TAKE 1 TABLET BY MOUTH DAILY 09/07/19    Doree Albee, MD  Rivaroxaban (XARELTO) 15 MG TABS tablet Take 1 tablet (15 mg total) by mouth daily with supper. 11/10/19   Arnoldo Lenis, MD  vitamin B-12 (CYANOCOBALAMIN) 1000 MCG tablet Take 1,000 mcg by mouth daily.     [provider]  DULoxetine (CYMBALTA) 60 MG capsule Take 60 mg by mouth 2 (two) times daily.     [provider]  NP THYROID 60 MG tablet Take 60 mg by mouth daily before breakfast.    [provider]    Review of Systems:  Negative except as otherwise mentioned in HPI.   Physical Exam: Vitals:   12/02/19 0529 12/02/19 0537 12/02/19 0630 12/02/19 0800  BP:  125/68 120/66 119/64  Pulse:  95 92 89  Resp:  20 (!) 25 15  Temp:  (!) 97.4 F (36.3 C)    TempSrc:  Oral    SpO2:  90% (!) 89% 90%  Weight: 67.7 kg       Constitutional: NAD, calm, comfortable; denying CP.  Weak and nauseated. No fever.  Eyes: PERRL, lids and conjunctivae normal, no icterus, no nystagmus. ENMT: Mucous membranes dry on exam. Posterior pharynx clear of any exudate or lesions.   Neck: normal, supple, no masses, no thyromegaly, no JVD. Respiratory: Positive scattered rhonchi; otherwise clear to auscultation bilaterally, no wheezing, no crackles. Normal respiratory effort. No accessory muscle use.  Cardiovascular: Regular rate and rhythm, no murmurs / rubs / gallops. No extremity edema. 2+ pedal pulses. No carotid bruits.  Abdomen: no tenderness, no masses palpated. No hepatosplenomegaly. Bowel sounds positive.  Musculoskeletal: no clubbing / cyanosis. No joint deformity upper and lower extremities. Good ROM, no contractures. Normal muscle tone.  Skin: no rashes, lesions, ulcers. No induration Neurologic: CN 2-12 grossly intact. Sensation intact, DTR normal. Strength 4/5 in all 4 limbs secondary to poor effort.  Psychiatric: Normal judgment and insight. Alert and oriented x 3. Normal mood.    Labs on Admission: I have personally reviewed the following  labs and imaging studies  CBC: Recent Labs  Lab 12/02/19 0532  WBC 9.1  NEUTROABS 5.7  HGB 13.7  HCT 42.6  MCV 99.1  PLT [id number]   Basic Metabolic Panel: Recent Labs  Lab 12/01/19 1358 12/02/19 0532  NA 139 139  K 2.4* 2.1*  CL 81* 76*  CO2 40* 45*  GLUCOSE 167* 143*  BUN 35* 38*  CREATININE 1.54* 1.75*  CALCIUM 9.1 8.8*  MG  --  2.2   GFR: Estimated Creatinine Clearance: 19.6 mL/min (A) (by C-G formula based on SCr of 1.75 mg/dL (H)).   Liver Function Tests: Recent Labs  Lab 12/01/19 1358  AST 49*  ALT 30*  BILITOT 2.0*  PROT 6.3   Thyroid Function Tests: Recent Labs    12/02/19 0532  TSH 0.655   Anemia Panel: Recent Labs    12/02/19 0532  VITAMINB12 2,645*   Urine analysis:    Component Value Date/Time   COLORURINE DARK YELLOW 12/01/2019 1531   APPEARANCEUR CLEAR 12/01/2019 1531   LABSPEC 1.014 12/01/2019 1531   PHURINE < OR = 5.0 12/01/2019 1531   GLUCOSEU NEGATIVE 12/01/2019 1531   HGBUR NEGATIVE 12/01/2019 1531   BILIRUBINUR NEGATIVE 09/19/2017 0206   KETONESUR 1+ (A) 12/01/2019 1531   PROTEINUR NEGATIVE 12/01/2019 1531   UROBILINOGEN 0.2 10/20/2013 1434   NITRITE NEGATIVE 09/19/2017 0206   LEUKOCYTESUR NEGATIVE 09/19/2017 0206   Radiological Exams on Admission: CT Head Wo Contrast  Result Date: 12/02/2019 CLINICAL DATA:  Confusion and hypokalemia EXAM: CT HEAD WITHOUT CONTRAST TECHNIQUE: Contiguous axial images were obtained from the base of the skull through the vertex without intravenous contrast. COMPARISON:  Brain MRI 01/12/2018 FINDINGS: Brain: No evidence of acute infarction, hemorrhage, hydrocephalus, extra-axial collection or mass lesion/mass effect. Chronic small vessel ischemia in the cerebral white matter that is confluent with superimposed infarcts in the centrum semiovale. Age normal brain volume. Vascular: Atherosclerotic calcification. Skull: Negative Sinuses/Orbits: Negative IMPRESSION: 1. No acute finding. 2. Chronic small  vessel ischemia. Electronically Signed   By: Monte Fantasia M.D.   On: 12/02/2019 06:57    EKG: Independently reviewed.  No acute ischemic changes.  Positive atrial fibrillation.  Assessment/Plan 1-Hypokalemia -In the setting of poor oral intake  -2 weeks now with complaints of nausea/dry heaving and decreased oral intake. -Patient chronically on diuretics for heart failure/volume management, which she explains has been compliant with it. -Electrolyte repletion will be initiated -Magnesium within normal limits -Fluid resuscitation given -As needed antiemetics will be provided -Will follow electrolytes trend  and monitor patient on telemetry overnight. -Potassium 2.4 on admission. -Holding diuretics at this time.  2-paroxysmal atrial fibrillation ([place]) -Chronically on Xarelto -Patient was on diltiazem and metoprolol prior to admission; but her metoprolol has been recently discontinued in the setting of low blood pressure. -Start low-dose Cardizem and monitor on telemetry. -CHADsVasc score 4  3-GERD (gastroesophageal reflux disease) -PPI twice daily.    4-Acute renal failure superimposed on stage 3b chronic kidney disease ([place]) -Setting of dehydration and prerenal azotemia -Continue holding nephrotoxic agents -Provide fluid resuscitation -Follow renal function trend.  5-mild elevation of anion gap with contraction alkalosis -Will hold diuretics agents -Provide fluid resuscitation overnight -Follow volume status repeat basic metabolic panel in the morning.  6-diastolic heart failure -Currently appears to be dehydrated -No complaints of shortness of breath or orthopnea. -No physical exam suggesting fluid overload. -As mentioned above holding diuretics agents, continue low-sodium diet, daily weights and strict I's and O's.  7-hypertension -Blood pressure soft but stable -Continue low-dose Cardizem and follow vital signs.   -Heart healthy diet has been ordered.    8-Acute  lower UTI -Seen by PCP where she was complaining of increased frequency and also slightly intermittent episodes of been confuse; Urinalysis apparently demonstrated at the office some leukocyte esterase and multiple bacteria. -Started on [place] prior to admission. -no dysuria currently -will follow urine culture -rocephin empirically started -gentle IVF's resuscitation overnight to be provided.  9-hx of asthma/COPD -chronic cough reported and unchanged -will continue advair   DVT prophylaxis: Chronically on Xarelto. Code Status: DNR Family Communication: No family at bedside, son contacted over the phone. Disposition Plan: To be determined; but hopefully discharge home in the next 24 hours or so after repletion of electrolytes and hydration.  Physical therapy evaluation has been requested. Consults called: None Admission status: Observation, length of stay 8 less than 2 midnights; telemetry bed.   Time Spent: 65 minutes  Barton Dubois MD [facility] Pager [phone]  12/02/2019, 9:43 AM

## 2019-12-02 NOTE — ED Notes (Signed)
Date and time results received: 12/02/19 0605  (use smartphrase ".now" to insert current time)  Test: potassium Critical Value: 2.1  Name of Provider Notified: Dr Tomi Bamberger  Orders Received? Or Actions Taken?: Actions Taken: orders received

## 2019-12-02 NOTE — Evaluation (Signed)
Physical Therapy Evaluation Patient Details Name: Sara Garcia MRN: [id number] DOB: 21-Oct-1927 Today's Date: 12/02/2019   History of Present Illness  Patient's son is here with his mother.  He states for the past 2 weeks she has had problems with nausea and dry heaves.  He states she did have decreased appetite but that has been improving the last couple of days.  She also has been having problems with her blood pressure being too low, under [id number] systolic.  He states she has been having memory problems like not remembering the day of the week they are not remembering if she took her medications.  They were seen by their PCP yesterday and had blood work done and they were called this morning to have her come to the emergency department for potassium of 2.4.  She also was started on Macrobid yesterday for possible UTI, culture has been sent and is pending.  Son also states that they had put her on fluid pills for "plebitis" Lasix 40 mg a day a couple months ago and on December 30 they also started on metolazone 2.5 mg  which they just switched to being from daily to every other day.  They had told him to stop her diltiazem and her metoprolol until her blood pressure improved.  Patient is also on Xarelto.    Clinical Impression  Patient demonstrates labored movement for sitting up at bedside requiring increased time, slow labored cadence ambulating in room without loss of balance, limited secondary to fatigue, on room air with SpO2 dropping from 88% to 85%, Orthostatic BP's as follows: supine 107/52, sitting 100/58, standing 110/42 - RN aware.  Patient tolerated sitting up in chair after therapy with her daughter and nursing staff present in room.  Patient will benefit from continued physical therapy in hospital and recommended venue below to increase strength, balance, endurance for safe ADLs and gait.     Follow Up Recommendations Home health PT;Supervision for mobility/OOB;Supervision - Intermittent     Equipment Recommendations  None recommended by PT    Recommendations for Other Services       Precautions / Restrictions Precautions Precautions: Fall Restrictions Weight Bearing Restrictions: No      Mobility  Bed Mobility Overal bed mobility: Needs Assistance Bed Mobility: Supine to Sit     Supine to sit: Min guard;Min assist     General bed mobility comments: increased time, labored movement  Transfers Overall transfer level: Needs assistance Equipment used: Rolling walker (2 wheeled) Transfers: Sit to/from [facility] Sit to Stand: Min guard;Min assist Stand pivot transfers: Min assist       General transfer comment: slow labored movement  Ambulation/Gait Ambulation/Gait assistance: Min assist Gait Distance (Feet): 25 Feet Assistive device: Rolling walker (2 wheeled) Gait Pattern/deviations: Decreased step length - right;Decreased step length - left;Decreased stride length Gait velocity: decreased   General Gait Details: slow labored cadence, slightly unsteady limited secondary to fatigue, on room air with SpO2 dropped from 88% to 85%  Stairs            Wheelchair Mobility    Modified Rankin (Stroke Patients Only)       Balance Overall balance assessment: Needs assistance Sitting-balance support: Feet supported;No upper extremity supported Sitting balance-Leahy Scale: Fair Sitting balance - Comments: fair/good seated at bedside   Standing balance support: During functional activity;Bilateral upper extremity supported Standing balance-Leahy Scale: Fair Standing balance comment: using RW  Pertinent Vitals/Pain Pain Assessment: No/denies pain    Home Living Family/patient expects to be discharged to:: Private residence Living Arrangements: Spouse/significant other Available Help at Discharge: Family;Available PRN/intermittently Type of Home: House Home Access: Ramped entrance      Home Layout: One level Home Equipment: Walker - 2 wheels;Walker - 4 wheels;Wheelchair - [employer];Shower seat      Prior Function Level of Independence: Needs assistance   Gait / Transfers Assistance Needed: Short distanced household ambulator with RW, uses wheelchair mostly per patient's daughter  ADL's / Homemaking Assistance Needed: assisted by family        Hand Dominance   Dominant Hand: Right    Extremity/Trunk Assessment   Upper Extremity Assessment Upper Extremity Assessment: Generalized weakness    Lower Extremity Assessment Lower Extremity Assessment: Generalized weakness    Cervical / Trunk Assessment Cervical / Trunk Assessment: Kyphotic  Communication   Communication: No difficulties  Cognition Arousal/Alertness: Awake/alert Behavior During Therapy: WFL for tasks assessed/performed Overall Cognitive Status: Within Functional Limits for tasks assessed                                        General Comments      Exercises     Assessment/Plan    PT Assessment Patient needs continued PT services  PT Problem List Decreased strength;Decreased activity tolerance;Decreased balance;Decreased mobility       PT Treatment Interventions Gait training;Functional mobility training;Therapeutic activities;Therapeutic exercise;Patient/family education    PT Goals (Current goals can be found in the Care Plan section)  Acute Rehab PT Goals Patient Stated Goal: return home with family to assist PT Goal Formulation: With patient Time For Goal Achievement: 12/09/19 Potential to Achieve Goals: Good    Frequency Min 3X/week   Barriers to discharge        Co-evaluation               AM-PAC PT "6 Clicks" Mobility  Outcome Measure Help needed turning from your back to your side while in a flat bed without using bedrails?: None Help needed moving from lying on your back to sitting on the side of a flat bed without using  bedrails?: A Little Help needed moving to and from a bed to a chair (including a wheelchair)?: A Little Help needed standing up from a chair using your arms (e.g., wheelchair or bedside chair)?: A Little Help needed to walk in hospital room?: A Lot Help needed climbing 3-5 steps with a railing? : A Lot 6 Click Score: 17    End of Session   Activity Tolerance: Patient tolerated treatment well;Patient limited by fatigue Patient left: in chair;with call bell/phone within reach;with family/visitor present;with nursing/sitter in room Nurse Communication: Mobility status PT Visit Diagnosis: Unsteadiness on feet (R26.81);Other abnormalities of gait and mobility (R26.89);Muscle weakness (generalized) (M62.81)    Time: [id number] PT Time Calculation (min) (ACUTE ONLY): 35 min   Charges:   PT Evaluation $PT Eval Moderate Complexity: 1 Mod PT Treatments $Therapeutic Activity: 23-37 mins        3:34 PM, 12/02/19 Lonell Grandchild, MPT Physical Therapist with [facility] [phone] office [phone] mobile phone

## 2019-12-02 NOTE — Telephone Encounter (Signed)
Patient's labs have resulted and show a critically low potassium level of 2.4.  I attempted to call this patient's mobile number to let her know that she needs to go to the hospital for potassium repletion, I believe she answered the phone however she did not seem to be able to hear me and she did not verify her name.  Unfortunately I had to hang up because she was not answering my questions appropriately in order to appropriately identify her.  I did then call her daughter and left a voicemail, and then I called her son and left a voicemail.  He called me right back and I notified him of the low potassium and need for emergency repletion.  He tells me he is aware and he will proceed to the hospital with the patient for emergency potassium repletion.

## 2019-12-02 NOTE — Clinical Social Work Note (Signed)
Spoke with son regarding PT recommendation for HHPT. Son is agreeable. Patient accepted for HHPT by [facility]. Spoke with Tresea Mall with [facility].  Patient declined by all other [facility] agencies due to having [employer].     Sara Garcia, Clydene Pugh, LCSW

## 2019-12-02 NOTE — ED Triage Notes (Signed)
Pt brought in by [facility] after her PCP called and told her her Potassium was 2.4 and that she needed to come to ED.

## 2019-12-02 NOTE — Plan of Care (Signed)
  Problem: Acute Rehab PT Goals(only PT should resolve) Goal: Pt Will Go Supine/Side To Sit Outcome: Progressing Flowsheets (Taken 12/02/2019 1535) Pt will go Supine/Side to Sit: with supervision Goal: Patient Will Transfer Sit To/From Stand Outcome: Progressing Flowsheets (Taken 12/02/2019 1535) Patient will transfer sit to/from stand:  with supervision  with min guard assist Goal: Pt Will Transfer Bed To Chair/Chair To Bed Outcome: Progressing Flowsheets (Taken 12/02/2019 1535) Pt will Transfer Bed to Chair/Chair to Bed:  with supervision  min guard assist Goal: Pt Will Ambulate Outcome: Progressing Flowsheets (Taken 12/02/2019 1535) Pt will Ambulate:  25 feet  with supervision  with min guard assist  with rolling walker   3:36 PM, 12/02/19 Lonell Grandchild, MPT Physical Therapist with [facility] [phone] office [phone] mobile phone

## 2019-12-03 DIAGNOSIS — K219 Gastro-esophageal reflux disease without esophagitis: Secondary | ICD-10-CM | POA: Diagnosis present

## 2019-12-03 DIAGNOSIS — Z91048 Other nonmedicinal substance allergy status: Secondary | ICD-10-CM | POA: Diagnosis not present

## 2019-12-03 DIAGNOSIS — I130 Hypertensive heart and chronic kidney disease with heart failure and stage 1 through stage 4 chronic kidney disease, or unspecified chronic kidney disease: Secondary | ICD-10-CM | POA: Diagnosis present

## 2019-12-03 DIAGNOSIS — I5032 Chronic diastolic (congestive) heart failure: Secondary | ICD-10-CM | POA: Diagnosis present

## 2019-12-03 DIAGNOSIS — Z20822 Contact with and (suspected) exposure to covid-19: Secondary | ICD-10-CM | POA: Diagnosis present

## 2019-12-03 DIAGNOSIS — Z91040 Latex allergy status: Secondary | ICD-10-CM | POA: Diagnosis not present

## 2019-12-03 DIAGNOSIS — M810 Age-related osteoporosis without current pathological fracture: Secondary | ICD-10-CM | POA: Diagnosis present

## 2019-12-03 DIAGNOSIS — R410 Disorientation, unspecified: Secondary | ICD-10-CM | POA: Diagnosis not present

## 2019-12-03 DIAGNOSIS — N179 Acute kidney failure, unspecified: Secondary | ICD-10-CM | POA: Diagnosis present

## 2019-12-03 DIAGNOSIS — E876 Hypokalemia: Secondary | ICD-10-CM | POA: Diagnosis present

## 2019-12-03 DIAGNOSIS — J449 Chronic obstructive pulmonary disease, unspecified: Secondary | ICD-10-CM | POA: Diagnosis present

## 2019-12-03 DIAGNOSIS — N390 Urinary tract infection, site not specified: Secondary | ICD-10-CM | POA: Diagnosis present

## 2019-12-03 DIAGNOSIS — Z86718 Personal history of other venous thrombosis and embolism: Secondary | ICD-10-CM | POA: Diagnosis not present

## 2019-12-03 DIAGNOSIS — Z888 Allergy status to other drugs, medicaments and biological substances status: Secondary | ICD-10-CM | POA: Diagnosis not present

## 2019-12-03 DIAGNOSIS — Z882 Allergy status to sulfonamides status: Secondary | ICD-10-CM | POA: Diagnosis not present

## 2019-12-03 DIAGNOSIS — E874 Mixed disorder of acid-base balance: Secondary | ICD-10-CM | POA: Diagnosis present

## 2019-12-03 DIAGNOSIS — E860 Dehydration: Secondary | ICD-10-CM | POA: Diagnosis present

## 2019-12-03 DIAGNOSIS — Z8673 Personal history of transient ischemic attack (TIA), and cerebral infarction without residual deficits: Secondary | ICD-10-CM | POA: Diagnosis not present

## 2019-12-03 DIAGNOSIS — I480 Paroxysmal atrial fibrillation: Secondary | ICD-10-CM | POA: Diagnosis present

## 2019-12-03 DIAGNOSIS — M1990 Unspecified osteoarthritis, unspecified site: Secondary | ICD-10-CM | POA: Diagnosis present

## 2019-12-03 DIAGNOSIS — R110 Nausea: Secondary | ICD-10-CM | POA: Diagnosis not present

## 2019-12-03 DIAGNOSIS — Z7901 Long term (current) use of anticoagulants: Secondary | ICD-10-CM | POA: Diagnosis not present

## 2019-12-03 DIAGNOSIS — N1832 Chronic kidney disease, stage 3b: Secondary | ICD-10-CM | POA: Diagnosis present

## 2019-12-03 DIAGNOSIS — Z880 Allergy status to penicillin: Secondary | ICD-10-CM | POA: Diagnosis not present

## 2019-12-03 DIAGNOSIS — Z66 Do not resuscitate: Secondary | ICD-10-CM | POA: Diagnosis present

## 2019-12-03 DIAGNOSIS — Z885 Allergy status to narcotic agent status: Secondary | ICD-10-CM | POA: Diagnosis not present

## 2019-12-03 LAB — CBC
HCT: 38.9 % (ref 36.0–46.0)
Hemoglobin: 12.2 g/dL (ref 12.0–15.0)
MCH: 31.1 pg (ref 26.0–34.0)
MCHC: 31.4 g/dL (ref 30.0–36.0)
MCV: 99.2 fL (ref 80.0–100.0)
Platelets: 197 10*3/uL (ref 150–400)
RBC: 3.92 MIL/uL (ref 3.87–5.11)
RDW: 14.1 % (ref 11.5–15.5)
WBC: 5.7 10*3/uL (ref 4.0–10.5)
nRBC: 0 % (ref 0.0–0.2)

## 2019-12-03 LAB — BASIC METABOLIC PANEL
Anion gap: 11 (ref 5–15)
BUN: 21 mg/dL (ref 8–23)
CO2: 43 mmol/L — ABNORMAL HIGH (ref 22–32)
Calcium: 8.3 mg/dL — ABNORMAL LOW (ref 8.9–10.3)
Chloride: 85 mmol/L — ABNORMAL LOW (ref 98–111)
Creatinine, Ser: 1.02 mg/dL — ABNORMAL HIGH (ref 0.44–1.00)
GFR calc Af Amer: 56 mL/min — ABNORMAL LOW (ref >60)
GFR calc non Af Amer: 49 mL/min — ABNORMAL LOW (ref >60)
Glucose, Bld: 119 mg/dL — ABNORMAL HIGH (ref 70–99)
Potassium: 2.8 mmol/L — ABNORMAL LOW (ref 3.5–5.1)
Sodium: 139 mmol/L (ref 135–145)

## 2019-12-03 LAB — URINALYSIS W MICROSCOPIC + REFLEX CULTURE
Bacteria, UA: NONE SEEN /HPF
Bilirubin Urine: NEGATIVE
Glucose, UA: NEGATIVE
Hgb urine dipstick: NEGATIVE
Nitrites, Initial: NEGATIVE
Protein, ur: NEGATIVE
Specific Gravity, Urine: 1.014 (ref 1.001–1.03)
pH: <=5 (ref 5.0–8.0)

## 2019-12-03 LAB — URINE CULTURE: Result:: NO GROWTH

## 2019-12-03 LAB — CULTURE INDICATED

## 2019-12-03 MED ORDER — FLUTICASONE FUROATE-VILANTEROL 200-25 MCG/INH IN AEPB
INHALATION_SPRAY | RESPIRATORY_TRACT | Status: AC
  Administered 2019-12-03: 1 via RESPIRATORY_TRACT
  Filled 2019-12-03: qty 28

## 2019-12-03 MED ORDER — POTASSIUM CHLORIDE CRYS ER 20 MEQ PO TBCR
40.0000 meq | EXTENDED_RELEASE_TABLET | ORAL | Status: AC
  Administered 2019-12-03 (×2): 40 meq via ORAL
  Filled 2019-12-03 (×2): qty 2

## 2019-12-03 MED ORDER — CAMPHOR-MENTHOL 0.5-0.5 % EX LOTN
TOPICAL_LOTION | CUTANEOUS | Status: DC | PRN
  Filled 2019-12-03: qty 222

## 2019-12-03 MED ORDER — DIPHENHYDRAMINE HCL 25 MG PO CAPS
25.0000 mg | ORAL_CAPSULE | Freq: Three times a day (TID) | ORAL | Status: DC | PRN
  Administered 2019-12-03: 25 mg via ORAL
  Filled 2019-12-03: qty 1

## 2019-12-03 NOTE — Progress Notes (Signed)
PROGRESS NOTE    Sara Garcia  [id number] DOB: [birth year] DOA: 12/02/2019 PCP: Doree Albee, MD     Brief Narrative:  86 y.o. female with a past medical history significant for asthma/COPD, gastroesophageal flux disease, paroxysmal atrial fibrillation (chronically on Xarelto), CKD stage 3b, prior history of stroke without residual deficits, essential hypertension, chronic diastolic heart failure and hx of DVT; who presented to the hospital secondary to generalized weakness, vomiting, increasing frequency and abnormal blood work. Patient seen on 12/01/19 at her PCP office after having above mentioned symptoms for approx 4 days; severe hypokalemia and also with abnormal urinalysis suggesting UTI.  She was referred to the emergency department for further evaluation and management.  Patient also reported intermittent nausea and decreased oral intake. No focal weakness, no chest pain, no vomiting, no hematuria, no melena, no hematochezia, no other acute complaints.  The ED work-up demonstrated potassium of 2.1, metabolic alkalosis, worsening renal function and physical exam with concerns for dehydration (decrease skin turgor and dry MM).  Fluids initiated, electrolytes repletion started and [facility] contacted to admit patient for further evaluation and management.   Assessment & Plan: 1-hypokalemia -In the setting of poor oral intake -Patient was also receiving continue management for volume status control managed with diuretics. -No further nausea, no vomiting; reports improvement in oral intake -continue electrolyte repletion -K 2.8 currently, follow trend.  2-paroxysmal atrial fibrillation -continue xarelto -continue diltiazem  -CHADSVASC score 4  3-GERD -continue PPI  4-acute renal failure superimposed on stage IIIb chronic kidney disease -Continue gentle hydration -Continue holding nephrotoxic agents -Follow renal function trend. -Creatinine down to 1.02 today.;  Patient's Cr  peaked at 1.75 on admission.  5-mild anion gap elevation with contraction alkalosis -Continue holding diuretics -Patient encouraged to maintain adequate hydration -Continue fluid resuscitation -Follow basic metabolic panel in a.m.  6-chronic diastolic heart failure appears to be compensated at this time -Continue holding diuretics agents.   -Continue daily weights and I's and O's.  7-HTN -stable -continue current antihypertensive regimen.  8-asthma/COPD on exam -No shortness of breath, no complaining of any wheezing or orthopnea -Continue Advair and PRN bronchodilators.  9-UTI -no dysuria -culture unrevealing -will complete 3 days of antibiotics, as she was symptomatic prior to admission.   DVT prophylaxis: Xarelto Code Status: DNR/DNI. Family Communication: Daughter in law contacted over the phone. Disposition Plan: Continue IV fluids, electrolytes repletion and follow clinical response.  Patient continued doing better anticipate discharge home on 12/04/2019 with home health PT services.  Consultants:   None   Procedures:   See below for x-ray reports.  Antimicrobials:  Anti-infectives (From admission, onward)   Start     Dose/Rate Route Frequency Ordered Stop   12/02/19 1400  cefTRIAXone (ROCEPHIN) 1 g in sodium chloride 0.9 % 100 mL IVPB     1 g 200 mL/hr over 30 Minutes Intravenous Every 24 hours 12/02/19 1254     12/02/19 1000  nitrofurantoin (macrocrystal-monohydrate) (MACROBID) capsule 100 mg  Status:  Discontinued     100 mg Oral 2 times daily 12/02/19 0731 12/02/19 1254      Subjective: Feeling much better today; no chest pain, no nausea or vomiting.  Denies dysuria.  Reports some improvement in her appetite.  Objective: Vitals:   12/03/19 0600 12/03/19 0859 12/03/19 0913 12/03/19 1420  BP: 109/67  (!) 124/53 125/68  Pulse: 76  98 90  Resp: 20  17 18   Temp: 97.6 F (36.4 C)   98.2 F (36.8 C)  TempSrc:  Axillary   Oral  SpO2: 96% 100% 99% (!) 89%    Weight:      Height:        Intake/Output Summary (Last 24 hours) at 12/03/2019 1617 Last data filed at 12/03/2019 1218 Gross per 24 hour  Intake 1023.93 ml  Output 802 ml  Net 221.93 ml   Filed Weights   12/02/19 0529 12/02/19 1112  Weight: 67.7 kg 67.4 kg    Examination: General exam: Alert, awake, oriented x 3; no chest pain, no nausea, no shortness of breath.  Reports feeling better and is starting to eat more today.  No dysuria and she is afebrile. Respiratory system: Clear to auscultation. Respiratory effort normal. Cardiovascular system:RRR. No murmurs, rubs, gallops. Gastrointestinal system: Abdomen is nondistended, soft and nontender. No organomegaly or masses felt. Normal bowel sounds heard. Central nervous system: Alert and oriented. No focal neurological deficits. Extremities: No C/C/E Skin: No rashes, no petechiae. Psychiatry: Judgement and insight appear normal. Mood & affect appropriate.     Data Reviewed: I have personally reviewed following labs and imaging studies  CBC: Recent Labs  Lab 12/02/19 0532 12/03/19 0514  WBC 9.1 5.7  NEUTROABS 5.7  --   HGB 13.7 12.2  HCT 42.6 38.9  MCV 99.1 99.2  PLT 240 [id number]   Basic Metabolic Panel: Recent Labs  Lab 12/01/19 1358 12/02/19 0532 12/03/19 0514  NA 139 139 139  K 2.4* 2.1* 2.8*  CL 81* 76* 85*  CO2 40* 45* 43*  GLUCOSE 167* 143* 119*  BUN 35* 38* 21  CREATININE 1.54* 1.75* 1.02*  CALCIUM 9.1 8.8* 8.3*  MG  --  2.2  --    GFR: Estimated Creatinine Clearance: 33.6 mL/min (A) (by C-G formula based on SCr of 1.02 mg/dL (H)).   Liver Function Tests: Recent Labs  Lab 12/01/19 1358  AST 49*  ALT 30*  BILITOT 2.0*  PROT 6.3   Thyroid Function Tests: Recent Labs    12/02/19 0532  TSH 0.655   Anemia Panel: Recent Labs    12/02/19 0532  VITAMINB12 2,645*   Urine analysis:    Component Value Date/Time   COLORURINE DARK YELLOW 12/01/2019 1531   APPEARANCEUR CLEAR 12/01/2019 1531    LABSPEC 1.014 12/01/2019 1531   PHURINE < OR = 5.0 12/01/2019 1531   GLUCOSEU NEGATIVE 12/01/2019 1531   HGBUR NEGATIVE 12/01/2019 1531   BILIRUBINUR NEGATIVE 09/19/2017 0206   KETONESUR 1+ (A) 12/01/2019 1531   PROTEINUR NEGATIVE 12/01/2019 1531   UROBILINOGEN 0.2 10/20/2013 1434   NITRITE NEGATIVE 09/19/2017 0206   LEUKOCYTESUR NEGATIVE 09/19/2017 0206    Recent Results (from the past 240 hour(s))  Urine Culture     Status: None   Collection Time: 12/01/19  3:31 PM  Result Value Ref Range Status   Source: URINE  Final   Status: FINAL  Final   Result: No Growth  Final  Respiratory Panel by RT PCR (Flu A&B, Covid) - Nasopharyngeal Swab     Status: None   Collection Time: 12/02/19  9:21 AM   Specimen: Nasopharyngeal Swab  Result Value Ref Range Status   SARS Coronavirus 2 by RT PCR NEGATIVE NEGATIVE Final    Comment: (NOTE) SARS-CoV-2 target nucleic acids are NOT DETECTED. The SARS-CoV-2 RNA is generally detectable in upper respiratoy specimens during the acute phase of infection. The lowest concentration of SARS-CoV-2 viral copies this assay can detect is 131 copies/mL. A negative result does not preclude SARS-Cov-2 infection and should not be used  as the sole basis for treatment or other patient management decisions. A negative result may occur with  improper specimen collection/handling, submission of specimen other than nasopharyngeal swab, presence of viral mutation(s) within the areas targeted by this assay, and inadequate number of viral copies (<131 copies/mL). A negative result must be combined with clinical observations, patient history, and epidemiological information. The expected result is Negative. Fact Sheet for Patients:  PinkCheek.be Fact Sheet for Healthcare Providers:  GravelBags.it This test is not yet ap proved or cleared by the [place] FDA and  has been authorized for detection and/or  diagnosis of SARS-CoV-2 by FDA under an Emergency Use Authorization (EUA). This EUA will remain  in effect (meaning this test can be used) for the duration of the COVID-19 declaration under Section 564(b)(1) of the Act, 21 U.S.C. section 360bbb-3(b)(1), unless the authorization is terminated or revoked sooner.    Influenza A by PCR NEGATIVE NEGATIVE Final   Influenza B by PCR NEGATIVE NEGATIVE Final    Comment: (NOTE) The Xpert Xpress SARS-CoV-2/FLU/RSV assay is intended as an aid in  the diagnosis of influenza from Nasopharyngeal swab specimens and  should not be used as a sole basis for treatment. Nasal washings and  aspirates are unacceptable for Xpert Xpress SARS-CoV-2/FLU/RSV  testing. Fact Sheet for Patients: PinkCheek.be Fact Sheet for Healthcare Providers: GravelBags.it This test is not yet approved or cleared by the [place] FDA and  has been authorized for detection and/or diagnosis of SARS-CoV-2 by  FDA under an Emergency Use Authorization (EUA). This EUA will remain  in effect (meaning this test can be used) for the duration of the  Covid-19 declaration under Section 564(b)(1) of the Act, 21  U.S.C. section 360bbb-3(b)(1), unless the authorization is  terminated or revoked. Performed at [facility], [address] [place],  [address]       Radiology Studies: CT Head Wo Contrast  Result Date: 12/02/2019 CLINICAL DATA:  Confusion and hypokalemia EXAM: CT HEAD WITHOUT CONTRAST TECHNIQUE: Contiguous axial images were obtained from the base of the skull through the vertex without intravenous contrast. COMPARISON:  Brain MRI 01/12/2018 FINDINGS: Brain: No evidence of acute infarction, hemorrhage, hydrocephalus, extra-axial collection or mass lesion/mass effect. Chronic small vessel ischemia in the cerebral white matter that is confluent with superimposed infarcts in the centrum semiovale. Age normal brain  volume. Vascular: Atherosclerotic calcification. Skull: Negative Sinuses/Orbits: Negative IMPRESSION: 1. No acute finding. 2. Chronic small vessel ischemia. Electronically Signed   By: Monte Fantasia M.D.   On: 12/02/2019 06:57     Scheduled Meds: . diltiazem  60 mg Oral BID  . DULoxetine  60 mg Oral BID  . fluticasone furoate-vilanterol  1 puff Inhalation Daily  . mouth rinse  15 mL Mouth Rinse BID  . pantoprazole  40 mg Oral Daily  . Rivaroxaban  15 mg Oral Q supper  . thyroid  60 mg Oral Daily  . vitamin B-12  1,000 mcg Oral Daily   Continuous Infusions: . 0.9 % NaCl with KCl 40 mEq / L 50 mL/hr (12/03/19 0908)  . cefTRIAXone (ROCEPHIN)  IV 1 g (12/03/19 1354)     LOS: 0 days    Time spent: 30 minutes.    Barton Dubois, MD [facility] Pager [phone]   12/03/2019, 4:17 PM

## 2019-12-03 NOTE — Progress Notes (Signed)
Patient complaint of itching all over. MD made aware. MD placed orders for benadryl and sarna lotion for itching.

## 2019-12-03 NOTE — Progress Notes (Signed)
Physical Therapy Treatment Patient Details Name: Sara Garcia MRN: [id number] DOB: [birth year] Today's Date: 12/03/2019    History of Present Illness Patient's son is here with his mother.  He states for the past 2 weeks she has had problems with nausea and dry heaves.  He states she did have decreased appetite but that has been improving the last couple of days.  She also has been having problems with her blood pressure being too low, under [id number] systolic.  He states she has been having memory problems like not remembering the day of the week they are not remembering if she took her medications.  They were seen by their PCP yesterday and had blood work done and they were called this morning to have her come to the emergency department for potassium of 2.4.  She also was started on Macrobid yesterday for possible UTI, culture has been sent and is pending.  Son also states that they had put her on fluid pills for "plebitis" Lasix 40 mg a day a couple months ago and on December 30 they also started on metolazone 2.5 mg  which they just switched to being from daily to every other day.  They had told him to stop her diltiazem and her metoprolol until her blood pressure improved.  Patient is also on Xarelto.    PT Comments    Patient requires min assist for bed mobility and transfers. Her O2 sat was 90% at rest on room air,  SpO2 dropped to 76 after 6 feet, fluctuating from 76-90% following returning to seated in chair on RA. Patient O2 fluctuating quite frequently throughout session. She ambulates with very short labored steps with use of RW. Patient ended session seated in chair - RN aware. Patient will benefit from continued physical therapy in hospital and recommended venue below to increase strength, balance, endurance for safe ADLs and gait.   Follow Up Recommendations  Home health PT;Supervision for mobility/OOB;Supervision - Intermittent     Equipment Recommendations  None recommended by PT     Recommendations for Other Services       Precautions / Restrictions Precautions Precautions: Fall Restrictions Weight Bearing Restrictions: No    Mobility  Bed Mobility Overal bed mobility: Needs Assistance Bed Mobility: Supine to Sit     Supine to sit: Min guard;Min assist     General bed mobility comments: increased time, labored movement. O2 sat 90 at rest  Transfers Overall transfer level: Needs assistance Equipment used: Rolling walker (2 wheeled) Transfers: Sit to/from [facility] Sit to Stand: Min guard;Min assist Stand pivot transfers: Min assist       General transfer comment: slow labored movement  Ambulation/Gait Ambulation/Gait assistance: Min assist Gait Distance (Feet): 10 Feet Assistive device: Rolling walker (2 wheeled) Gait Pattern/deviations: Decreased step length - right;Decreased step length - left;Decreased stride length Gait velocity: decreased   General Gait Details: slow labored cadence, slightly unsteady limited secondary to fatigue, on room air with SpO2 dropped to 76 after 6 feet, fluctuating from 76-90% following returning to seated in chair on RA   Stairs             Wheelchair Mobility    Modified Rankin (Stroke Patients Only)       Balance Overall balance assessment: Needs assistance Sitting-balance support: Feet supported;No upper extremity supported Sitting balance-Leahy Scale: Fair Sitting balance - Comments: fair/good seated at bedside   Standing balance support: During functional activity;Bilateral upper extremity supported Standing balance-Leahy Scale: Fair Standing balance  comment: using RW                            Cognition Arousal/Alertness: Awake/alert Behavior During Therapy: WFL for tasks assessed/performed Overall Cognitive Status: Within Functional Limits for tasks assessed                                        Exercises      General Comments         Pertinent Vitals/Pain Pain Assessment: Faces Faces Pain Scale: Hurts even more Pain Location: "tailbone" and shoulder occasionally Pain Intervention(s): Limited activity within patient's tolerance;Monitored during session    Home Living                      Prior Function            PT Goals (current goals can now be found in the care plan section) Acute Rehab PT Goals Patient Stated Goal: return home with family to assist PT Goal Formulation: With patient Time For Goal Achievement: 12/09/19 Potential to Achieve Goals: Good Progress towards PT goals: Progressing toward goals    Frequency    Min 3X/week      PT Plan Current plan remains appropriate    Co-evaluation              AM-PAC PT "6 Clicks" Mobility   Outcome Measure  Help needed turning from your back to your side while in a flat bed without using bedrails?: None Help needed moving from lying on your back to sitting on the side of a flat bed without using bedrails?: A Little Help needed moving to and from a bed to a chair (including a wheelchair)?: A Little Help needed standing up from a chair using your arms (e.g., wheelchair or bedside chair)?: A Little Help needed to walk in hospital room?: A Lot Help needed climbing 3-5 steps with a railing? : A Lot 6 Click Score: 17    End of Session Equipment Utilized During Treatment: Gait belt;Oxygen Activity Tolerance: Patient tolerated treatment well;Patient limited by fatigue Patient left: in chair;with call bell/phone within reach Nurse Communication: Mobility status PT Visit Diagnosis: Unsteadiness on feet (R26.81);Other abnormalities of gait and mobility (R26.89);Muscle weakness (generalized) (M62.81)     Time: [id number] PT Time Calculation (min) (ACUTE ONLY): 38 min  Charges:  $Therapeutic Activity: 38-52 mins                    12:18 PM, 12/03/19 Mearl Latin PT, DPT Physical Therapist at [facility]

## 2019-12-03 NOTE — Care Management Important Message (Signed)
Important Message  Patient Details  Name: Sara Garcia MRN: [id number] Date of Birth: 1928/04/21   Medicare Important Message Given:        Tommy Medal 12/03/2019, 2:47 PM

## 2019-12-04 DIAGNOSIS — R410 Disorientation, unspecified: Secondary | ICD-10-CM

## 2019-12-04 DIAGNOSIS — R110 Nausea: Secondary | ICD-10-CM

## 2019-12-04 DIAGNOSIS — E873 Alkalosis: Secondary | ICD-10-CM

## 2019-12-04 LAB — BASIC METABOLIC PANEL
Anion gap: 8 (ref 5–15)
BUN: 14 mg/dL (ref 8–23)
CO2: 37 mmol/L — ABNORMAL HIGH (ref 22–32)
Calcium: 7.9 mg/dL — ABNORMAL LOW (ref 8.9–10.3)
Chloride: 93 mmol/L — ABNORMAL LOW (ref 98–111)
Creatinine, Ser: 0.81 mg/dL (ref 0.44–1.00)
GFR calc Af Amer: >60 mL/min (ref >60)
GFR calc non Af Amer: >60 mL/min (ref >60)
Glucose, Bld: 103 mg/dL — ABNORMAL HIGH (ref 70–99)
Potassium: 3.3 mmol/L — ABNORMAL LOW (ref 3.5–5.1)
Sodium: 138 mmol/L (ref 135–145)

## 2019-12-04 LAB — MAGNESIUM: Magnesium: 1.9 mg/dL (ref 1.7–2.4)

## 2019-12-04 MED ORDER — FUROSEMIDE 20 MG PO TABS: 20.0000 mg | ORAL_TABLET | Freq: Every day | ORAL | 2 refills | Status: DC

## 2019-12-04 MED ORDER — POTASSIUM CHLORIDE ER 20 MEQ PO TBCR: 20.0000 meq | EXTENDED_RELEASE_TABLET | Freq: Every day | ORAL | 0 refills | Status: DC

## 2019-12-04 NOTE — Discharge Summary (Signed)
Physician Discharge Summary  Sara Garcia [id number] DOB: 1928/09/14 DOA: 12/02/2019  PCP: Doree Albee, MD  Admit date: 12/02/2019 Discharge date: 12/04/2019  Time spent: 35 minutes  Recommendations for Outpatient Follow-up:  1. Repeat basic metabolic panel to follow electrolytes and renal function 2. Reassess blood pressure and volume status with further adjustment to  medication regimen as needed.   Discharge Diagnoses:  Principal Problem:   Hypokalemia Active Problems:   Atrial fibrillation (HCC)   GERD (gastroesophageal reflux disease)   Acute renal failure superimposed on stage 3b chronic kidney disease (HCC)   Dehydration   Urinary tract infection without hematuria   Confusion   Metabolic alkalosis   Nausea   Discharge Condition: Stable and improved.  Patient discharged home with instruction to follow-up with PCP in 10 days.  Diet recommendation: Heart healthy diet.  Filed Weights   12/02/19 0529 12/02/19 1112  Weight: 67.7 kg 67.4 kg    History of present illness:  86 y.o.femalewith a past medical history significant for asthma/COPD, gastroesophageal flux disease, paroxysmal atrial fibrillation (chronically on Xarelto),CKD stage 3b, prior history of stroke without residual deficits, essential hypertension,chronic diastolic heart failureand hx of DVT; who presented to the hospital secondary to generalized weakness, vomiting, increasing frequency and abnormal blood work. Patient seen on 12/01/19 at her PCP office after having above mentioned symptoms for approx 4 days;severe hypokalemia and also with abnormal urinalysis suggesting UTI. She was referred to the emergency department for further evaluation and management.  Patient also reported intermittent nauseaanddecreased oral intake. No focal weakness, no chest pain, no vomiting, no hematuria, no melena, no hematochezia, no other acute complaints.  The ED work-up demonstrated potassium of 2.1, metabolic  alkalosis, worsening renal function and physical exam with concerns for dehydration (decrease skin turgor and dry MM).Fluids initiated, electrolytes repletion started and [facility] contacted to admit patient for further evaluation and management.  Hospital Course:  1-hypokalemia -In the setting of poor oral intake -Patient was also receiving continue management for volume status control with diuretics as an outpatient. -No further nausea, no vomiting; reports improvement in oral intake -K 3.3 currently, repeat basic metabolic panel to reassess electrolytes at follow-up visit. -Patient discharged on daily potassium supplementation.    2-paroxysmal atrial fibrillation -continue xarelto -continue diltiazem  -Continue outpatient follow-up with cardiology service as previously scheduled. -CHADSVASC score 4  3-GERD -continue PPI  4-acute renal failure superimposed on stage IIIb chronic kidney disease -Resolved after fluid resuscitation and holding nephrotoxic agents -Creatinine was 0.87 at time of discharge -Continue holding nephrotoxic agents -Repeat basic metabolic panel at follow-up visit to reassess renal function trend. -Patient's Cr peaked at 1.75 on admission.  5-mild anion gap elevation with contraction alkalosis -Anion gap within normal limits at discharge -Bicarb 37 -Patient encouraged to maintain adequate hydration -Repeat basic metabolic panel follow-up visit to reassess electrolytes.  6-chronic diastolic heart failure appears to be compensated at this time -Safe to resume adjusted dose of Lasix on daily basis -Continue the rest of her home medication regimen. -Outpatient follow-up with cardiology service as previously scheduled.  7-HTN -stable -continue current antihypertensive regimen.  8-asthma/COPD on exam -No shortness of breath, no complaining of any wheezing or orthopnea -Continue Advair and PRN bronchodilators.  9-UTI/metabolic encephalopathy. -no  dysuria, no fever and normal WBCs at time of discharge. -Patient completed 3 days of IV Rocephin while inpatient -culture unrevealing of any growth -Patient was treated as she was symptomatic prior to admission. -Mentation back to baseline also without discharge.  10-physical deconditioning -Physical therapy has evaluated patient and recommended home health PT at time of discharge.   Procedures:  See below for x-ray reports  Consultations:  None  Discharge Exam: Vitals:   12/04/19 0550 12/04/19 0731  BP: (!) 117/55   Pulse: 92   Resp: 18   Temp: 98.6 F (37 C)   SpO2: 90% 92%    General: Afebrile, no chest pain, no nausea, no vomiting.  Patient reports no shortness of breath.  No dysuria and has demonstrated to be able to tolerate diet and take medication by mouth without any problems. Cardiovascular: S1-S2, no rubs, no gallops, no JVD. Respiratory: Positive scattered rhonchi; no wheezing, no crackles, normal respiratory effort. Abdomen: Soft, nontender, nondistended, positive bowel sounds Extremities: No cyanosis, no clubbing, no edema.  Discharge Instructions   Discharge Instructions    Diet - low sodium heart healthy   Complete by: As directed    Discharge instructions   Complete by: As directed    Maintain adequate hydration and nutrition Take medications as prescribed Check daily weights Arrange follow-up with PCP in 10 days   Increase activity slowly   Complete by: As directed      Allergies as of 12/04/2019      Reactions   Latex Itching, Rash   Adhesive [tape] Other (See Comments)   Tears skin   Betadine [povidone Iodine]    blisters   Ciprofloxacin Nausea And Vomiting   Codeine Nausea And Vomiting   Upsets stomach, nightmares   Penicillins Hives   Has patient had a PCN reaction causing immediate rash, facial/tongue/throat swelling, SOB or lightheadedness with hypotension: yes Has patient had a PCN reaction causing severe rash involving mucus  membranes or skin necrosis: yes Has patient had a PCN reaction that required hospitalization: no Has patient had a PCN reaction occurring within the last 10 years: no If all of the above answers are "NO", then may proceed with Cephalosporin use.   Sulfa Antibiotics Hives   Wellbutrin [bupropion]    Reaction unknown   Aloe Rash      Medication List    STOP taking these medications   metolazone 2.5 MG tablet Commonly known as: ZAROXOLYN   metoprolol tartrate 25 MG tablet Commonly known as: LOPRESSOR   nitrofurantoin (macrocrystal-monohydrate) 100 MG capsule Commonly known as: MACROBID     TAKE these medications   acetaminophen 650 MG CR tablet Commonly known as: TYLENOL Take 650 mg by mouth every 8 (eight) hours as needed for pain.   cholecalciferol 1000 units tablet Commonly known as: VITAMIN D Take 1,000 Units by mouth 3 (three) times daily.   diltiazem 60 MG 12 hr capsule Commonly known as: CARDIZEM SR Take 1 capsule (60 mg total) by mouth 2 (two) times daily.   DULoxetine 60 MG capsule Commonly known as: CYMBALTA TAKE 1 CAPSULE BY MOUTH TWICE DAILY   esomeprazole 20 MG capsule Commonly known as: NEXIUM TAKE 1 CAPSULE BY MOUTH ONCE DAILY BEFORE BREAKFAST   Fluticasone-Salmeterol 250-50 MCG/DOSE Aepb Commonly known as: ADVAIR Inhale 1 puff into the lungs daily.   furosemide 20 MG tablet Commonly known as: LASIX Take 1 tablet (20 mg total) by mouth daily. What changed:   medication strength  how much to take  when to take this   NP Thyroid 60 MG tablet Generic drug: thyroid TAKE 1 TABLET BY MOUTH DAILY   ondansetron 4 MG disintegrating tablet Commonly known as: ZOFRAN-ODT Take 4 mg by mouth every 8 (eight) hours as  needed.   Potassium Chloride ER 20 MEQ Tbcr Take 20 mEq by mouth daily.   Rivaroxaban 15 MG Tabs tablet Commonly known as: XARELTO Take 1 tablet (15 mg total) by mouth daily with supper.   vitamin B-12 1000 MCG tablet Commonly known  as: CYANOCOBALAMIN Take 1,000 mcg by mouth daily.      Allergies  Allergen Reactions  . Latex Itching and Rash  . Adhesive [Tape] Other (See Comments)    Tears skin  . Betadine [Povidone Iodine]     blisters  . Ciprofloxacin Nausea And Vomiting  . Codeine Nausea And Vomiting    Upsets stomach, nightmares  . Penicillins Hives    Has patient had a PCN reaction causing immediate rash, facial/tongue/throat swelling, SOB or lightheadedness with hypotension: yes Has patient had a PCN reaction causing severe rash involving mucus membranes or skin necrosis: yes Has patient had a PCN reaction that required hospitalization: no Has patient had a PCN reaction occurring within the last 10 years: no If all of the above answers are "NO", then may proceed with Cephalosporin use.   . Sulfa Antibiotics Hives  . Wellbutrin [Bupropion]     Reaction unknown  . Aloe Rash      The results of significant diagnostics from this hospitalization (including imaging, microbiology, ancillary and laboratory) are listed below for reference.    Significant Diagnostic Studies: CT Head Wo Contrast  Result Date: 12/02/2019 CLINICAL DATA:  Confusion and hypokalemia EXAM: CT HEAD WITHOUT CONTRAST TECHNIQUE: Contiguous axial images were obtained from the base of the skull through the vertex without intravenous contrast. COMPARISON:  Brain MRI 01/12/2018 FINDINGS: Brain: No evidence of acute infarction, hemorrhage, hydrocephalus, extra-axial collection or mass lesion/mass effect. Chronic small vessel ischemia in the cerebral white matter that is confluent with superimposed infarcts in the centrum semiovale. Age normal brain volume. Vascular: Atherosclerotic calcification. Skull: Negative Sinuses/Orbits: Negative IMPRESSION: 1. No acute finding. 2. Chronic small vessel ischemia. Electronically Signed   By: Monte Fantasia M.D.   On: 12/02/2019 06:57    Microbiology: Recent Results (from the past 240 hour(s))  Urine  Culture     Status: None   Collection Time: 12/01/19  3:31 PM  Result Value Ref Range Status   Source: URINE  Final   Status: FINAL  Final   Result: No Growth  Final  Respiratory Panel by RT PCR (Flu A&B, Covid) - Nasopharyngeal Swab     Status: None   Collection Time: 12/02/19  9:21 AM   Specimen: Nasopharyngeal Swab  Result Value Ref Range Status   SARS Coronavirus 2 by RT PCR NEGATIVE NEGATIVE Final    Comment: (NOTE) SARS-CoV-2 target nucleic acids are NOT DETECTED. The SARS-CoV-2 RNA is generally detectable in upper respiratoy specimens during the acute phase of infection. The lowest concentration of SARS-CoV-2 viral copies this assay can detect is 131 copies/mL. A negative result does not preclude SARS-Cov-2 infection and should not be used as the sole basis for treatment or other patient management decisions. A negative result may occur with  improper specimen collection/handling, submission of specimen other than nasopharyngeal swab, presence of viral mutation(s) within the areas targeted by this assay, and inadequate number of viral copies (<131 copies/mL). A negative result must be combined with clinical observations, patient history, and epidemiological information. The expected result is Negative. Fact Sheet for Patients:  PinkCheek.be Fact Sheet for Healthcare Providers:  GravelBags.it This test is not yet ap proved or cleared by the [place] and  has been authorized for detection and/or diagnosis of SARS-CoV-2 by FDA under an Emergency Use Authorization (EUA). This EUA will remain  in effect (meaning this test can be used) for the duration of the COVID-19 declaration under Section 564(b)(1) of the Act, 21 U.S.C. section 360bbb-3(b)(1), unless the authorization is terminated or revoked sooner.    Influenza A by PCR NEGATIVE NEGATIVE Final   Influenza B by PCR NEGATIVE NEGATIVE Final    Comment:  (NOTE) The Xpert Xpress SARS-CoV-2/FLU/RSV assay is intended as an aid in  the diagnosis of influenza from Nasopharyngeal swab specimens and  should not be used as a sole basis for treatment. Nasal washings and  aspirates are unacceptable for Xpert Xpress SARS-CoV-2/FLU/RSV  testing. Fact Sheet for Patients: PinkCheek.be Fact Sheet for Healthcare Providers: GravelBags.it This test is not yet approved or cleared by the [place] FDA and  has been authorized for detection and/or diagnosis of SARS-CoV-2 by  FDA under an Emergency Use Authorization (EUA). This EUA will remain  in effect (meaning this test can be used) for the duration of the  Covid-19 declaration under Section 564(b)(1) of the Act, 21  U.S.C. section 360bbb-3(b)(1), unless the authorization is  terminated or revoked. Performed at [facility], [address], [place], [place] [address]      Labs: Basic Metabolic Panel: Recent Labs  Lab 12/01/19 1358 12/02/19 0532 12/03/19 0514 12/04/19 0659  NA 139 139 139 138  K 2.4* 2.1* 2.8* 3.3*  CL 81* 76* 85* 93*  CO2 40* 45* 43* 37*  GLUCOSE 167* 143* 119* 103*  BUN 35* 38* 21 14  CREATININE 1.54* 1.75* 1.02* 0.81  CALCIUM 9.1 8.8* 8.3* 7.9*  MG  --  2.2  --  1.9   Liver Function Tests: Recent Labs  Lab 12/01/19 1358  AST 49*  ALT 30*  BILITOT 2.0*  PROT 6.3   CBC: Recent Labs  Lab 12/02/19 0532 12/03/19 0514  WBC 9.1 5.7  NEUTROABS 5.7  --   HGB 13.7 12.2  HCT 42.6 38.9  MCV 99.1 99.2  PLT 240 197    Signed:  Barton Dubois MD.  [facility] 12/04/2019, 1:12 PM

## 2019-12-06 ENCOUNTER — Telehealth (INDEPENDENT_AMBULATORY_CARE_PROVIDER_SITE_OTHER): Payer: Self-pay

## 2019-12-06 NOTE — Telephone Encounter (Signed)
Will share information to him to be aware of concerns.

## 2019-12-06 NOTE — Telephone Encounter (Signed)
If this was pertaining to her blood work on 12/01/19, it was addressed. Patient was sent to ER and subsequently has been admitted and discharged. Please let Dr. Anastasio Champion know if this is pertaining to other lab work so that he can address this while I am off. Thank you.

## 2019-12-08 ENCOUNTER — Ambulatory Visit: Payer: Medicare HMO | Admitting: Gastroenterology

## 2019-12-14 ENCOUNTER — Ambulatory Visit (INDEPENDENT_AMBULATORY_CARE_PROVIDER_SITE_OTHER): Payer: Medicare HMO | Admitting: Nurse Practitioner

## 2019-12-14 ENCOUNTER — Encounter (INDEPENDENT_AMBULATORY_CARE_PROVIDER_SITE_OTHER): Payer: Self-pay | Admitting: Nurse Practitioner

## 2019-12-14 ENCOUNTER — Other Ambulatory Visit: Payer: Self-pay

## 2019-12-14 VITALS — BP 120/80 | HR 82 | Temp 97.5°F | Ht 62.0 in | Wt 135.8 lb

## 2019-12-14 DIAGNOSIS — I480 Paroxysmal atrial fibrillation: Secondary | ICD-10-CM

## 2019-12-14 DIAGNOSIS — N179 Acute kidney failure, unspecified: Secondary | ICD-10-CM

## 2019-12-14 DIAGNOSIS — N1832 Chronic kidney disease, stage 3b: Secondary | ICD-10-CM

## 2019-12-14 DIAGNOSIS — R531 Weakness: Secondary | ICD-10-CM

## 2019-12-14 DIAGNOSIS — M542 Cervicalgia: Secondary | ICD-10-CM | POA: Insufficient documentation

## 2019-12-14 DIAGNOSIS — I10 Essential (primary) hypertension: Secondary | ICD-10-CM

## 2019-12-14 DIAGNOSIS — R600 Localized edema: Secondary | ICD-10-CM

## 2019-12-14 DIAGNOSIS — R609 Edema, unspecified: Secondary | ICD-10-CM | POA: Insufficient documentation

## 2019-12-14 DIAGNOSIS — M25511 Pain in right shoulder: Secondary | ICD-10-CM | POA: Insufficient documentation

## 2019-12-14 DIAGNOSIS — G8929 Other chronic pain: Secondary | ICD-10-CM

## 2019-12-14 DIAGNOSIS — R1310 Dysphagia, unspecified: Secondary | ICD-10-CM

## 2019-12-14 DIAGNOSIS — E876 Hypokalemia: Secondary | ICD-10-CM

## 2019-12-14 NOTE — Progress Notes (Signed)
Subjective:  Patient ID: Sara Garcia, female    DOB: [birth year]  Age: 86 y.o. MRN: [id number]  CC:  Chief Complaint  Patient presents with  . Other    Post-Hospital Visit      HPI  This patient arrives today for the above.  She was admitted to the hospital on 12/01/2019 for severe hypokalemia and dehydration.  She also had acute renal failure on top of her chronic kidney disease.  Prior to this admission she did have a history of nausea and vomiting which had resolved prior to being admitted.  She was discharged on 12/02/2019 and was told to stop her metoprolol 25 mg tablet (she was taking 1 and 1/2 tablets by mouth daily), metolazone 2.5 mg tablet (she was taking 1 tablet by mouth daily), and her nitrofurantoin.  She was started on nitrofurantoin for empiric treatment of urinary tract infection.  During the course of her hospitalization her urine culture did not show any bacterial growth.  Her furosemide was changed from 40 mg twice a day to 20 mg daily.  She was also placed on potassium supplement 4mq tablet daily.  She is accompanied today by her son.  He tells me that she seems to be feeling much better since her discharge.  He does tell me that the hospital had ordered home health physical therapy prior to her discharge, however they found out that the home health agency is outside of their network and he would like a new agency chosen that will be covered by their insurance.  The patient is also concerned today regarding some right shoulder pain and neck pain that are both chronic in nature.   Past Medical History:  Diagnosis Date  . [place]neurysm ([place]    groin  . Arthritis   . Asthma   . D[place] thrombosis) ([facility]  . Esophageal dysphagia    limited food and pill  . Esophageal reflux   . Essential hypertension, benign   . GERD (gastroesophageal reflux disease)   . Osteoporosis   . Overactive bladder   . Paroxysmal atrial fibrillation (HCC)    a. on Xarelto  for anticoagulation  . 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 Stroke ([facility]   . Unspecified prolapse of vaginal walls       Family History  Problem Relation Age of Onset  . Heart disease Mother   . Stroke Mother   . Emphysema Father   . COPD Sister   . Liver disease Brother   . Colon polyps Neg Hx   . Colon cancer Neg Hx     Social History   Social History Narrative   Married for 21 years in second marriage.First marriage lasted 258years.Lives with husband.   Social History   Tobacco Use  . Smoking status: Never Smoker  . Smokeless tobacco: Never Used  Substance Use Topics  . Alcohol use: No    Alcohol/week: 0.0 standard drinks     Current Meds  Medication Sig  . acetaminophen (TYLENOL) 650 MG CR tablet Take 650 mg by mouth every 8 (eight) hours as needed for pain.  . cholecalciferol (VITAMIN D) 1000 units tablet Take 1,000 Units by mouth 3 (three) times daily.  .Marland Kitchendiltiazem (CARDIZEM SR) 60 MG 12 hr capsule Take 1 capsule (60 mg total) by mouth 2 (two) times daily.  . DULoxetine (CYMBALTA) 60 MG capsule TAKE 1 CAPSULE BY MOUTH TWICE DAILY  . esomeprazole (NEXIUM) 20 MG capsule TAKE 1 CAPSULE  BY MOUTH ONCE DAILY BEFORE BREAKFAST  . Fluticasone-Salmeterol (ADVAIR) 250-50 MCG/DOSE AEPB Inhale 1 puff into the lungs daily.   . furosemide (LASIX) 20 MG tablet Take 1 tablet (20 mg total) by mouth daily.  . NP THYROID 60 MG tablet TAKE 1 TABLET BY MOUTH DAILY  . Potassium Chloride ER 20 MEQ TBCR Take 20 mEq by mouth daily.  . Rivaroxaban (XARELTO) 15 MG TABS tablet Take 1 tablet (15 mg total) by mouth daily with supper.  . vitamin B-12 (CYANOCOBALAMIN) 1000 MCG tablet Take 1,000 mcg by mouth daily.     ROS:  Reviewed and negative unless otherwise stated in HPI   Objective:   Today's Vitals: BP 120/80 (BP Location: Left Arm, Patient Position: Sitting, Cuff Size: Normal)   Pulse 82   Temp (!) 97.5 F (36.4 C) (Temporal)   Ht _0  (1.575 m)   Wt 135 lb 12.8 oz (61.6 kg)    SpO2 95%   BMI 24.84 kg/m  Vitals with BMI 12/14/2019 12/04/2019 12/03/2019  Height _1  - -  Weight 135 lbs 13 oz - -  BMI 74.12 - -  Systolic 878 676 720  Diastolic 80 55 49  Pulse 82 92 97     Physical Exam Vitals reviewed.  Constitutional:      General: She is not in acute distress.    Appearance: Normal appearance.  HENT:     Head: Normocephalic and atraumatic.  Cardiovascular:     Rate and Rhythm: Normal rate and regular rhythm.     Pulses: Normal pulses.     Heart sounds: Normal heart sounds.  Pulmonary:     Effort: Pulmonary effort is normal.     Breath sounds: Normal breath sounds.  Musculoskeletal:     Right lower leg: 1+ Pitting Edema present.     Left lower leg: 1+ Pitting Edema present.  Skin:    General: Skin is warm and dry.  Neurological:     General: No focal deficit present.     Mental Status: She is alert and oriented to person, place, and time.  Psychiatric:        Mood and Affect: Mood normal.        Behavior: Behavior normal.        Judgment: Judgment normal.          Assessment   1. Hypokalemia   2. Essential hypertension   3. Paroxysmal atrial fibrillation (HCC)   4. Acute renal failure superimposed on stage 3b chronic kidney disease, unspecified acute renal failure type ([place])   5. Bilateral lower extremity edema   6. Weakness   7. Chronic right shoulder pain   8. Neck pain   9. Dysphagia, unspecified type       Tests ordered Orders Placed This Encounter  Procedures  . CMP with eGFR([facility])  . Ambulatory referral to [facility]: 1., 4.- 5.   I will recollect CMP for further evaluation today.  As long as her renal function and potassium level have returned to baseline I I would recommend that she increase her daily furosemide from 20 mg daily to 20 mg twice a day.  She will follow-up in approximately 2 to 4 weeks for close monitoring and repeat blood work.  2.-3.  On exam today her heart rhythm was actually regular  and did not seem to be clinically in atrial fibrillation, rate was also well controlled.  For now she will continue on her diltiazem,  and I will not restart her metoprolol.  I do recall that her daughter does check the patient's vital signs at home and has noted elevated heart rate in the past.  I did tell the son that if this occurs again that he needs to call this office and we may consider restarting her metoprolol.    6.  I will reorder home health physical therapy for further assistance with evaluation and management of her weakness and deconditioning.  7.-8.  For now I have recommended that she use over-the-counter analgesia to treat her pain.  She will continue use her Tylenol as needed and I recommended she take 200 to 600 mg ibuprofen up to 3 times a day as needed.  I did warn her against using ibuprofen on a daily basis due to negative side effects that can have on her cardiovascular system and kidneys.  She and her son, they understand.  9.  Of note when we were discussing ordering home health, the son indicated that the patient never did get evaluated by speech therapy in the past for her dysphagia.  I will reorder this for further evaluation and management.  No orders of the defined types were placed in this encounter.   Patient to follow-up in 2-4 weeks  Ailene Ards, NP

## 2019-12-15 ENCOUNTER — Other Ambulatory Visit (INDEPENDENT_AMBULATORY_CARE_PROVIDER_SITE_OTHER): Payer: Self-pay | Admitting: Nurse Practitioner

## 2019-12-15 DIAGNOSIS — E876 Hypokalemia: Secondary | ICD-10-CM

## 2019-12-15 LAB — COMPLETE METABOLIC PANEL WITH GFR
AG Ratio: 1.3 (calc) (ref 1.0–2.5)
ALT: 14 U/L (ref 6–29)
AST: 20 U/L (ref 10–35)
Albumin: 3.4 g/dL — ABNORMAL LOW (ref 3.6–5.1)
Alkaline phosphatase (APISO): 83 U/L (ref 37–153)
BUN: 9 mg/dL (ref 7–25)
CO2: 25 mmol/L (ref 20–32)
Calcium: 9 mg/dL (ref 8.6–10.4)
Chloride: 108 mmol/L (ref 98–110)
Creat: 0.7 mg/dL (ref 0.60–0.88)
GFR, Est African American: 89 mL/min/{1.73_m2} (ref >=60)
GFR, Est Non African American: 77 mL/min/{1.73_m2} (ref >=60)
Globulin: 2.7 g/dL (calc) (ref 1.9–3.7)
Glucose, Bld: 99 mg/dL (ref 65–99)
Potassium: 3.5 mmol/L (ref 3.5–5.3)
Sodium: 143 mmol/L (ref 135–146)
Total Bilirubin: 0.6 mg/dL (ref 0.2–1.2)
Total Protein: 6.1 g/dL (ref 6.1–8.1)

## 2019-12-15 MED ORDER — POTASSIUM CHLORIDE ER 20 MEQ PO TBCR: 20.0000 meq | EXTENDED_RELEASE_TABLET | Freq: Two times a day (BID) | ORAL | 2 refills | Status: DC

## 2019-12-15 MED ORDER — FUROSEMIDE 20 MG PO TABS: 20.0000 mg | ORAL_TABLET | Freq: Two times a day (BID) | ORAL | 2 refills | Status: DC

## 2019-12-15 NOTE — Progress Notes (Signed)
Gave instruction to [employer].She will let son know he handles all the medicine.

## 2019-12-20 ENCOUNTER — Encounter: Payer: Self-pay | Admitting: Gastroenterology

## 2019-12-20 ENCOUNTER — Ambulatory Visit (INDEPENDENT_AMBULATORY_CARE_PROVIDER_SITE_OTHER): Payer: Medicare HMO | Admitting: Gastroenterology

## 2019-12-20 ENCOUNTER — Other Ambulatory Visit: Payer: Self-pay

## 2019-12-20 DIAGNOSIS — K5900 Constipation, unspecified: Secondary | ICD-10-CM

## 2019-12-20 DIAGNOSIS — R1314 Dysphagia, pharyngoesophageal phase: Secondary | ICD-10-CM

## 2019-12-20 NOTE — Progress Notes (Signed)
Primary Care Physician:  Doree Albee, MD Primary GI:  Garfield Cornea, MD    Patient Location: Home  Provider Location: [facility] office  Reason for Phone Visit:  Chief Complaint  Patient presents with  . Follow-up    still have some trouble swallowing, anemia, constipation     Persons present on the phone encounter, with roles: Patient, myself (provider),Angela Stallings CMA(updated meds and allergies)  Total time (minutes) spent on medical discussion: 22 minutes  Due to COVID-19, visit was conducted using telephonic method (no video was available).  Visit was requested by patient.  Virtual Visit via Telephone only  I connected with Ms. Kern on 12/20/19 at 11:30 AM EST by telephone and verified that I am speaking with the correct person using two identifiers.   I discussed the limitations, risks, security and privacy concerns of performing an evaluation and management service by telephone and the availability of in person appointments. I also discussed with the patient that there may be a patient responsible charge related to this service. The patient expressed understanding and agreed to proceed.   HPI:   Patient is a pleasant 86 y/o female who presents for telephone visit regarding dysphagia, constipation, anemia.  She has h/o heme + stool noted in 10/2018. Has declined EGD/TCS both her and her daughter several times.  Her hemoglobin has been normal now since October 2020.  Last checked in February.  Her ferritin was 15 in October but up to 29 in December. She called her gyn in 05/2019 to see if her IDA could be due to gyn source. She was instructed that this was unlikely. She has had some issues with vaginal bleeding in setting of pessary use. Instructed to contact gyn.   Overall bowel movements are stable.  She is taking a prescription laxative she has from a friend but she cannot recall the name.  Encouraged her to obtain prescription let me know what it is and we  can provide you with refill but she is not interested at this time.  No melena or rectal bleeding.  No abdominal pain.  Continues to have some issues swallowing her pills, using applesauce.  Her son pures her food.  She states she weighs 142 pounds at home today.  Unfortunately she has been doing virtual visits the past year we do not have a confirmed weight on her.  She denies any significant weight loss.  Swallowing dysfunction previously evaluated with barium pill esophagram and speech therapy evaluation.  Has age-related changes and not much for speech therapy to offer.  Current Outpatient Medications  Medication Sig Dispense Refill  . acetaminophen (TYLENOL) 650 MG CR tablet Take 650 mg by mouth as needed for pain.     . cholecalciferol (VITAMIN D) 1000 units tablet Take 1,000 Units by mouth 3 (three) times daily.    Marland Kitchen diltiazem (CARDIZEM SR) 60 MG 12 hr capsule Take 1 capsule (60 mg total) by mouth 2 (two) times daily. 180 capsule 0  . DULoxetine (CYMBALTA) 60 MG capsule TAKE 1 CAPSULE BY MOUTH TWICE DAILY 60 capsule 2  . esomeprazole (NEXIUM) 20 MG capsule TAKE 1 CAPSULE BY MOUTH ONCE DAILY BEFORE BREAKFAST 30 capsule 10  . Fluticasone-Salmeterol (ADVAIR) 250-50 MCG/DOSE AEPB Inhale 1 puff into the lungs daily.     . furosemide (LASIX) 20 MG tablet Take 1 tablet (20 mg total) by mouth 2 (two) times daily. 30 tablet 2  . NP THYROID 60 MG tablet TAKE  1 TABLET BY MOUTH DAILY 30 tablet 2  . ondansetron (ZOFRAN-ODT) 4 MG disintegrating tablet Take 4 mg by mouth as needed.     . Potassium Chloride ER 20 MEQ TBCR Take 20 mEq by mouth in the morning and at bedtime. 60 tablet 2  . Rivaroxaban (XARELTO) 15 MG TABS tablet Take 1 tablet (15 mg total) by mouth daily with supper. 42 tablet 0  . vitamin B-12 (CYANOCOBALAMIN) 1000 MCG tablet Take 1,000 mcg by mouth daily.      No current facility-administered medications for this visit.    Past Medical History:  Diagnosis Date  . 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 Arthritis   . Asthma   . DVT (deep venous thrombosis) ([place]) 2011/2012  . Esophageal dysphagia    limited food and pill  . Esophageal reflux   . Essential hypertension, benign   . GERD (gastroesophageal reflux disease)   . Osteoporosis   . Overactive bladder   . Paroxysmal atrial fibrillation (HCC)    a. on Xarelto for anticoagulation  . 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 Stroke ([place])   . Unspecified prolapse of vaginal walls     Past Surgical History:  Procedure Laterality Date  . ABDOMINAL HYSTERECTOMY    . COLONOSCOPY W/ BIOPSIES  02/01/2004   RMR: Anal papilla with normal rectum/Sigmoid diverticula/Small polyps in the cecum removed as described above. Inflammatory polyp  . ESOPHAGEAL DILATION     unknown year per patient.  . ESOPHAGEAL DILATION N/A 02/07/2015   RMR: Normal esophagus status post maloney dilation. Small hiatal hernia.   Marland Kitchen ESOPHAGOGASTRODUODENOSCOPY     unknown year per patient  . ESOPHAGOGASTRODUODENOSCOPY N/A 02/07/2015   RMR: Normal esophagus  status post  Maloney dilation. Small hiatal hernia  . FALSE ANEURYSM REPAIR Left 09/23/2017   Procedure: REPAIR FALSE ANEURYSM COMMON LEFT FEMORAL ARTERY WITH OSTEOTOMY OF BONE FRAGEMENT;  Surgeon: Angelia Mould, MD;  Location: [facility];  Service: Vascular;  Laterality: Left;  . FEMUR IM NAIL Left 06/11/2017   Procedure: INTRAMEDULLARY (IM) NAIL FEMORAL;  Surgeon: Rod Can, MD;  Location: [facility];  Service: Orthopedics;  Laterality: Left;  . LEG SURGERY Left   . NASAL SEPTUM SURGERY    . TONSILLECTOMY    . WRIST FRACTURE SURGERY      Family History  Problem Relation Age of Onset  . Heart disease Mother   . Stroke Mother   . Emphysema Father   . COPD Sister   . Liver disease Brother   . Colon polyps Neg Hx   . Colon cancer Neg Hx     Social History   Socioeconomic History  . Marital status: Married    Spouse name: Jeneen Rinks  . Number of children: 3  . Years of education: 12+  . Highest  education level: Some college, no degree  Occupational History  . Not on file  Tobacco Use  . Smoking status: Never Smoker  . Smokeless tobacco: Never Used  Substance and Sexual Activity  . Alcohol use: No    Alcohol/week: 0.0 standard drinks  . Drug use: No  . Sexual activity: Not Currently    Birth control/protection: Surgical    Comment: hysterectomy  Other Topics Concern  . Not on file  Social History Narrative   Married for 21 years in second marriage.First marriage lasted 10 years.Lives with husband.   Social Determinants of Health   Financial Resource Strain:   . Difficulty of Paying Living Expenses: Not on  file  Food Insecurity:   . Worried About Charity fundraiser in the Last Year: Not on file  . Ran Out of Food in the Last Year: Not on file  Transportation Needs:   . Lack of Transportation (Medical): Not on file  . Lack of Transportation (Non-Medical): Not on file  Physical Activity:   . Days of Exercise per Week: Not on file  . Minutes of Exercise per Session: Not on file  Stress:   . Feeling of Stress : Not on file  Social Connections:   . Frequency of Communication with Friends and Family: Not on file  . Frequency of Social Gatherings with Friends and Family: Not on file  . Attends Religious Services: Not on file  . Active Member of Clubs or Organizations: Not on file  . Attends Archivist Meetings: Not on file  . Marital Status: Not on file  Intimate Partner Violence:   . Fear of Current or Ex-Partner: Not on file  . Emotionally Abused: Not on file  . Physically Abused: Not on file  . Sexually Abused: Not on file      ROS:  General: Negative for anorexia, weight loss, fever, chills, fatigue, weakness.  See HPI Eyes: Negative for vision changes.  ENT: Negative for hoarseness, difficulty swallowing , nasal congestion.  See HPI CV: Negative for chest pain, angina, palpitations, dyspnea on exertion, peripheral edema.  Respiratory: Negative  for dyspnea at rest, dyspnea on exertion, cough, sputum, wheezing.  GI: See history of present illness. GU:  Negative for dysuria, hematuria, urinary incontinence, urinary frequency, nocturnal urination.  MS: Negative for joint pain, low back pain.  Derm: Negative for rash or itching.  Neuro: Negative for weakness, abnormal sensation, seizure, frequent headaches, memory loss, confusion.  Psych: Negative for anxiety, depression, suicidal ideation, hallucinations.  Endo: Negative for unusual weight change.  Heme: Negative for bruising or bleeding. Allergy: Negative for rash or hives.   Observations/Objective: Pleasant elderly female, no acute distress.  Complete sentences without shortness of breath.  Otherwise exam unavailable.   Lab Results  Component Value Date   CREATININE 0.70 12/14/2019   BUN 9 12/14/2019   NA 143 12/14/2019   K 3.5 12/14/2019   CL 108 12/14/2019   CO2 25 12/14/2019   Lab Results  Component Value Date   ALT 14 12/14/2019   AST 20 12/14/2019   ALKPHOS 87 09/19/2017   BILITOT 0.6 12/14/2019   Lab Results  Component Value Date   WBC 5.7 12/03/2019   HGB 12.2 12/03/2019   HCT 38.9 12/03/2019   MCV 99.2 12/03/2019   PLT 197 12/03/2019   Lab Results  Component Value Date   TSH 0.655 12/02/2019   Lab Results  Component Value Date   VITAMINB12 2,645 (H) 12/02/2019    Assessment and Plan: Pleasant 86 year old female with vague dysphagia/oropharyngeal dysphagia, IDA/heme positive stool, constipation presenting for follow-up.  Anemia has resolved.  She has declined EGD and colonoscopy on multiple occasions.  She is managing regarding her dysphagia, eating pured diet, resolving medication in applesauce.  Bowel movements fairly regular currently on external laxative which she does not know the morning.  Receiving from a friend.  Encouraged her to find out name and let me know and we could try her with appropriate prescription.  I am most concerned about  what her current weight is.  We have encouraged face-to-face encounters in order to obtain exam and updated weight but patient has been reluctant.  Based  on her reported weight at home, she has had minimal weight loss.  We will have her continue Nexium 20 mg daily.  Plan to see her in the office in 3 months.   Follow Up Instructions:    I discussed the assessment and treatment plan with the patient. The patient was provided an opportunity to ask questions and all were answered. The patient agreed with the plan and demonstrated an understanding of the instructions. AVS mailed to patient's home address.   The patient was advised to call back or seek an in-person evaluation if the symptoms worsen or if the condition fails to improve as anticipated.  I provided  22 minutes of non-face-to-face time during this encounter.   Neil Crouch, PA-C

## 2019-12-21 NOTE — Patient Instructions (Addendum)
1. Continue Nexium 20 mg daily for reflux. 2. Please find out what prescription laxative you are taking and let us know.  We can provide appropriate prescription. 3. Continue to monitor your weight at home, if you drop more than 5 pounds please notify us. 4. Return to the office in 3 months or call sooner if needed.

## 2019-12-22 NOTE — Progress Notes (Signed)
Cc'ed to pcp °

## 2019-12-27 ENCOUNTER — Other Ambulatory Visit: Payer: Self-pay | Admitting: Cardiology

## 2019-12-27 MED ORDER — RIVAROXABAN 15 MG PO TABS: 15.0000 mg | ORAL_TABLET | Freq: Every day | ORAL | 0 refills | Status: DC

## 2019-12-27 NOTE — Telephone Encounter (Signed)
Asking for Xarelto samples   Asking which vaccine would be best for his mom to take

## 2019-12-27 NOTE — Telephone Encounter (Signed)
Advised that samples are available for pick up 

## 2019-12-28 ENCOUNTER — Ambulatory Visit (INDEPENDENT_AMBULATORY_CARE_PROVIDER_SITE_OTHER): Payer: Medicare HMO | Admitting: Nurse Practitioner

## 2020-01-10 ENCOUNTER — Telehealth: Payer: Self-pay | Admitting: *Deleted

## 2020-01-10 NOTE — Telephone Encounter (Signed)
Reports strange feeling of numbness and tingling on left side of face, left arm and left side. Denies headache, blurred vision, chest pain, SOB, palpitations. Speech is clear. Advised that she needed to go to the ED for an evaluation. Verbalized understanding.

## 2020-01-11 ENCOUNTER — Telehealth (INDEPENDENT_AMBULATORY_CARE_PROVIDER_SITE_OTHER): Payer: Medicare HMO | Admitting: Cardiology

## 2020-01-11 ENCOUNTER — Encounter: Payer: Self-pay | Admitting: Cardiology

## 2020-01-11 VITALS — BP 137/96 | HR 125 | Ht 60.0 in | Wt 142.0 lb

## 2020-01-11 DIAGNOSIS — I480 Paroxysmal atrial fibrillation: Secondary | ICD-10-CM

## 2020-01-11 DIAGNOSIS — I10 Essential (primary) hypertension: Secondary | ICD-10-CM

## 2020-01-11 MED ORDER — METOPROLOL TARTRATE 25 MG PO TABS: 25.0000 mg | ORAL_TABLET | Freq: Two times a day (BID) | ORAL | 1 refills | Status: DC

## 2020-01-11 NOTE — Telephone Encounter (Signed)
THanks, we discussed at our appt today  Zandra Abts MD

## 2020-01-11 NOTE — Patient Instructions (Signed)
Your physician wants you to follow-up in: [place] will receive a reminder letter in the mail two months in advance. If you don't receive a letter, please call our office to schedule the follow-up appointment.  Your physician has recommended you make the following change in your medication:   START LOPRESSOR 25 MG TWICE DAILY   Thank you for choosing [facility]!!

## 2020-01-11 NOTE — Addendum Note (Signed)
Addended by: Julian Hy T on: 01/11/2020 10:53 AM   Modules accepted: Orders

## 2020-01-11 NOTE — Progress Notes (Signed)
Virtual Visit via Telephone Note   This visit type was conducted due to national recommendations for restrictions regarding the COVID-19 Pandemic (e.g. social distancing) in an effort to limit this patient's exposure and mitigate transmission in our community.  Due to her co-morbid illnesses, this patient is at least at moderate risk for complications without adequate follow up.  This format is felt to be most appropriate for this patient at this time.  The patient did not have access to video technology/had technical difficulties with video requiring transitioning to audio format only (telephone).  All issues noted in this document were discussed and addressed.  No physical exam could be performed with this format.  Please refer to the patient's chart for her  consent to telehealth for [facility].   The patient was identified using 2 identifiers.  Date:  01/11/2020   ID:  Sara Garcia, DOB Apr 10, 1928, MRN [id number]  Patient Location: Home Provider Location: Office  PCP:  Doree Albee, MD  Cardiologist:  Carlyle Dolly, MD  Electrophysiologist:  None   Evaluation Performed:  Follow-Up Visit  Chief Complaint:  Follow up  History of Present Illness:    Sara Garcia is a 86 y.o. female seen today for follow up of the following medical problems.   1. Parox afib - on beta blocker for rate control, eliquis stroke prophylaxis. - previously we had icreased her lopressor to 50mg  bid from 37.5 mg bid. With change she reported worsening fluttering in her chest and fatigue, we cut dose back to 37.5mg  bid.and she felt better  - she has not tolerated higher does of lopressor in the past.  - last visit we changed dilt to 60mg  tid, she reported some palpitations that seem to occur in between doses. There was concern about cost of long acting dilt - deneis any significant palpitations.  - no recent palpitations - compliant with meds  - HRs was 125 yesterday, today down in 80s -  usually HRs in the AM 90s-100s.  - in 11/2019 lopressor was stopped during admission with dehydration.     2. Dysphagia - followed by GI  3. HTN - home bp's 162/80, 151/88, 136/89, 156/97   4. 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 - 01/2018 MRI showed new lacunar infarct. Followed by neuro, recs to continue xarelto   5. Hip fracture - fall 05/2017 leading to fracture - follow up showed she developed a pseduoanerusym in her left groin - s/p pseudoanerusym repair 09/2017. Incision site infection, has been on doxy.  6. Heme + stools - followed by GI - she turned down additional testing, just following labs at this time - has upcoming cbc  Has had covid vaccine x1   The patient does not have symptoms concerning for COVID-19 infection (fever, chills, cough, or new shortness of breath).    Past Medical History:  Diagnosis Date  . Aneurysm ([place])    groin  . Arthritis   . Asthma   . DVT (deep venous thrombosis) ([place]) 2011/2012  . Esophageal dysphagia    limited food and pill  . Esophageal reflux   . Essential hypertension, benign   . GERD (gastroesophageal reflux disease)   . Osteoporosis   . Overactive bladder   . Paroxysmal atrial fibrillation (HCC)    a. on Xarelto for anticoagulation  . Pseudoaneurysm ([place]) 09/2017  . Stroke ([place])   . Unspecified prolapse of  vaginal walls    Past Surgical History:  Procedure Laterality Date  . ABDOMINAL HYSTERECTOMY    . COLONOSCOPY W/ BIOPSIES  02/01/2004   RMR: Anal papilla with normal rectum/Sigmoid diverticula/Small polyps in the cecum removed as described above. Inflammatory polyp  . ESOPHAGEAL DILATION     unknown year per patient.  . ESOPHAGEAL DILATION N/A 02/07/2015   RMR: Normal esophagus status post maloney dilation. Small hiatal hernia.   Marland Kitchen ESOPHAGOGASTRODUODENOSCOPY     unknown year per patient  .  ESOPHAGOGASTRODUODENOSCOPY N/A 02/07/2015   RMR: Normal esophagus  status post  Maloney dilation. Small hiatal hernia  . FALSE ANEURYSM REPAIR Left 09/23/2017   Procedure: REPAIR FALSE ANEURYSM COMMON LEFT FEMORAL ARTERY WITH OSTEOTOMY OF BONE FRAGEMENT;  Surgeon: Angelia Mould, MD;  Location: [facility];  Service: Vascular;  Laterality: Left;  . FEMUR IM NAIL Left 06/11/2017   Procedure: INTRAMEDULLARY (IM) NAIL FEMORAL;  Surgeon: Rod Can, MD;  Location: [facility];  Service: Orthopedics;  Laterality: Left;  . LEG SURGERY Left   . NASAL SEPTUM SURGERY    . TONSILLECTOMY    . WRIST FRACTURE SURGERY       Current Meds  Medication Sig  . [DISCONTINUED] diltiazem (CARDIZEM SR) 60 MG 12 hr capsule Take 1 capsule (60 mg total) by mouth 2 (two) times daily.     Allergies:   Latex, Adhesive [tape], Betadine [povidone iodine], Ciprofloxacin, Codeine, Penicillins, Sulfa antibiotics, Wellbutrin [bupropion], and Aloe   Social History   Tobacco Use  . Smoking status: Never Smoker  . Smokeless tobacco: Never Used  Substance Use Topics  . Alcohol use: No    Alcohol/week: 0.0 standard drinks  . Drug use: No     Family Hx: The patient's family history includes COPD in her sister; Emphysema in her father; Heart disease in her mother; Liver disease in her brother; Stroke in her mother. There is no history of Colon polyps or Colon cancer.  ROS:   Please see the history of present illness.     All other systems reviewed and are negative.   Prior CV studies:   The following studies were reviewed today:  12/2013 Lexicscan MPI Impression Exercise Capacity: Lexiscan with no exercise.  BP Response: Normal blood pressure response.  Clinical Symptoms: Nausea  ECG Impression: No significant ST segment change suggestive of ischemia.  Comparison with Prior Nuclear Study: No previous nuclear study performed  Overall Impression: Normal stress nuclear study.  LV Ejection Fraction: 85%.  LV Wall Motion: NL LV Function; NL Wall Motion    Jan 2015 echo Study Conclusions  - Study data: Technically difficult study. - Left ventricle: The cavity size was normal. Wall thickness was increased in a pattern of mild LVH. There is mild basal septal hypertrophy without obstructive gradient. Systolic function was normal. The estimated ejection fraction was in the range of 60% to 65%. Doppler parameters are consistent with abnormal left ventricular relaxation (grade 1 diastolic dysfunction). - Aortic valve: Mildly calcified annulus. Trileaflet; mildly thickened leaflets. Trivial regurgitation. Valve area: 1.69cm^2(VTI). Valve area: 1.79cm^2 (Vmax). - Mitral valve: Mildly calcified annulus. Mildly thickened leaflets .   01/2015 Holter monitor No significant arrhythmias   07/2017 echo Study Conclusions  - Left ventricle: The cavity size was normal. There was mild focal basal hypertrophy of the septum. Systolic function was normal. The estimated ejection fraction was in the range of 55% to 60%. Doppler parameters are consistent with both elevated ventricular end-diastolic filling pressure and elevated left atrial  filling pressure. - Mitral valve: Calcified annulus. Mildly thickened leaflets . - Right ventricle: The cavity size was mildly dilated. - Atrial septum: No defect or patent foramen ovale was identified.  Labs/Other Tests and Data Reviewed:    EKG:  No ECG reviewed.  Recent Labs: 12/02/2019: TSH 0.655 12/03/2019: Hemoglobin 12.2; Platelets 197 12/04/2019: Magnesium 1.9 12/14/2019: ALT 14; BUN 9; Creat 0.70; Potassium 3.5; Sodium 143   Recent Lipid Panel Lab Results  Component Value Date/Time   CHOL 153 01/22/2018 10:39 AM   TRIG 141 01/22/2018 10:39 AM   HDL 58 01/22/2018 10:39 AM   CHOLHDL 2.6 01/22/2018 10:39 AM   CHOLHDL 2.6 07/22/2017 03:58 AM   LDLCALC 67 01/22/2018 10:39 AM    Wt Readings from Last 3 Encounters:  01/11/20 142 lb  (64.4 kg)  12/14/19 135 lb 12.8 oz (61.6 kg)  12/02/19 148 lb 9.4 oz (67.4 kg)     Objective:    Vital Signs:  BP (!) 137/96   Pulse (!) 125   Ht 5' (1.524 m)   Wt 142 lb (64.4 kg)   BMI 27.73 kg/m    Normal affect. Normal speech pattern and tone. Comfortable, no apparent distress. No audible signs of SOB or wheezing.   ASSESSMENT & PLAN:    1. Parox afib -isolated HRs to 120s, average HRs 90s to low 100s. Lopressor was stopped during recent admission with dehydration and low bp's - high bp's at this time, we will restart her lopressor 25mg  bid - continue lopressor.   2. HTN - restart lopressor 25mg  bid  COVID-19 Education: The signs and symptoms of COVID-19 were discussed with the patient and how to seek care for testing (follow up with PCP or arrange E-visit).  The importance of social distancing was discussed today.  Time:   Today, I have spent 23 minutes with the patient with telehealth technology discussing the above problems.     Medication Adjustments/Labs and Tests Ordered: Current medicines are reviewed at length with the patient today.  Concerns regarding medicines are outlined above.   Tests Ordered: No orders of the defined types were placed in this encounter.   Medication Changes: No orders of the defined types were placed in this encounter.   Follow Up:  Either In Person or Virtual in 6 month(s)  Signed, Carlyle Dolly, MD  01/11/2020 9:53 AM    [place]

## 2020-01-19 ENCOUNTER — Encounter (INDEPENDENT_AMBULATORY_CARE_PROVIDER_SITE_OTHER): Payer: Self-pay | Admitting: Nurse Practitioner

## 2020-01-19 ENCOUNTER — Other Ambulatory Visit: Payer: Self-pay

## 2020-01-19 ENCOUNTER — Ambulatory Visit (INDEPENDENT_AMBULATORY_CARE_PROVIDER_SITE_OTHER): Payer: Medicare HMO | Admitting: Nurse Practitioner

## 2020-01-19 VITALS — BP 125/60 | HR 66 | Temp 97.4°F | Ht 62.0 in | Wt 138.2 lb

## 2020-01-19 DIAGNOSIS — E876 Hypokalemia: Secondary | ICD-10-CM

## 2020-01-19 DIAGNOSIS — R32 Unspecified urinary incontinence: Secondary | ICD-10-CM | POA: Diagnosis not present

## 2020-01-19 DIAGNOSIS — R531 Weakness: Secondary | ICD-10-CM | POA: Diagnosis not present

## 2020-01-19 DIAGNOSIS — I480 Paroxysmal atrial fibrillation: Secondary | ICD-10-CM

## 2020-01-19 DIAGNOSIS — G8929 Other chronic pain: Secondary | ICD-10-CM

## 2020-01-19 DIAGNOSIS — M25512 Pain in left shoulder: Secondary | ICD-10-CM

## 2020-01-19 DIAGNOSIS — R600 Localized edema: Secondary | ICD-10-CM

## 2020-01-19 DIAGNOSIS — L299 Pruritus, unspecified: Secondary | ICD-10-CM

## 2020-01-19 DIAGNOSIS — K5900 Constipation, unspecified: Secondary | ICD-10-CM

## 2020-01-19 MED ORDER — POLYETHYLENE GLYCOL 3350 17 GM/SCOOP PO POWD: 17.0000 g | Freq: Every day | ORAL | 1 refills | Status: DC

## 2020-01-19 MED ORDER — FLUOCINOLONE ACETONIDE 0.01 % EX SHAM: MEDICATED_SHAMPOO | CUTANEOUS | 2 refills | Status: DC

## 2020-01-19 NOTE — Progress Notes (Signed)
Subjective:  Patient ID: Sara Garcia, female    DOB: 27-Jul-1928  Age: 86 y.o. MRN: [id number]  CC:  Chief Complaint  Patient presents with  . Hypokalemia  . Follow-up    Lower extremity edema, atrial fibrillation, urinary incontinence/weakness, constipation, left shoulder pain, itchy scalp      HPI  This patient comes in today accompanied by her daughter for the above.  Hypokalemia/lower extremity edema: At her last office visit we increased her Lasix from 20 mg daily to 20 mg twice a day, she is also taking 20 mEq of potassium twice a day.  This is because her lower extremity edema was worsening with lower dose of Lasix.  She is tolerating the higher dose and although she still is experiencing some edema she tells me it is tolerable at this point.  Atrial fibrillation: She has a history of paroxysmal atrial fibrillation.  She had a follow-up with her cardiologist recently and they restarted her on metoprolol.  She now continues on diltiazem and metoprolol.  Left shoulder pain: She also tells me that she continues to have left shoulder pain as well as neck pain.  This has been ongoing for multiple months.  She has tried Tylenol, topical NSAIDs, as well as rest.  She denies any weakness but the pain is very bothersome for her.  Urinary incontinence/weakness: Her daughter tells me that she would like the patient to have a pure wick catheter in the home, as the patient does have urinary incontinence at night.  This appears to be functional in nature as the patient has a hard time getting up and out of bed in time to get to the bathroom.  Constipation: Patient also has intermittent constipation.  They would like a prescription for MiraLAX.  Itchy scalp: The patient has a history of pruritic scalp.  She has been evaluated by dermatology in the past.  She was put on clobetasol cream for her scalp in the past by her dermatologist.   Past Medical History:  Diagnosis Date  . 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 Arthritis   . Asthma   . DVT (deep venous thrombosis) ([place]) 2011/2012  . Esophageal dysphagia    limited food and pill  . Esophageal reflux   . Essential hypertension, benign   . GERD (gastroesophageal reflux disease)   . Osteoporosis   . Overactive bladder   . Paroxysmal atrial fibrillation (HCC)    a. on Xarelto for anticoagulation  . 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 Stroke ([place])   . Unspecified prolapse of vaginal walls       Family History  Problem Relation Age of Onset  . Heart disease Mother   . Stroke Mother   . Emphysema Father   . COPD Sister   . Liver disease Brother   . Colon polyps Neg Hx   . Colon cancer Neg Hx     Social History   Social History Narrative   Married for 21 years in second marriage.First marriage lasted 87 years.Lives with husband.   Social History   Tobacco Use  . Smoking status: Never Smoker  . Smokeless tobacco: Never Used  Substance Use Topics  . Alcohol use: No    Alcohol/week: 0.0 standard drinks     Current Meds  Medication Sig  . acetaminophen (TYLENOL) 650 MG CR tablet Take 650 mg by mouth as needed for pain.   . cholecalciferol (VITAMIN D) 1000 units tablet Take 1,000 Units by  mouth 3 (three) times daily.  Marland Kitchen diltiazem (CARDIZEM SR) 60 MG 12 hr capsule Take 60 mg by mouth in the morning, at noon, and at bedtime.  . DULoxetine (CYMBALTA) 60 MG capsule TAKE 1 CAPSULE BY MOUTH TWICE DAILY  . esomeprazole (NEXIUM) 20 MG capsule TAKE 1 CAPSULE BY MOUTH ONCE DAILY BEFORE BREAKFAST  . Fluticasone-Salmeterol (ADVAIR) 250-50 MCG/DOSE AEPB Inhale 1 puff into the lungs daily.   . furosemide (LASIX) 20 MG tablet Take 1 tablet (20 mg total) by mouth 2 (two) times daily.  . metoprolol tartrate (LOPRESSOR) 25 MG tablet Take 1 tablet (25 mg total) by mouth 2 (two) times daily.  . NP THYROID 60 MG tablet TAKE 1 TABLET BY MOUTH DAILY  . ondansetron (ZOFRAN-ODT) 4 MG disintegrating tablet Take 4 mg by mouth as needed.   .  Rivaroxaban (XARELTO) 15 MG TABS tablet Take 1 tablet (15 mg total) by mouth daily with supper.  . vitamin B-12 (CYANOCOBALAMIN) 1000 MCG tablet Take 1,000 mcg by mouth daily.     ROS:  Review of Systems  Constitutional: Positive for malaise/fatigue. Negative for fever.  Respiratory: Negative for shortness of breath.   Cardiovascular: Negative for chest pain.  Skin: Positive for itching.  Neurological: Negative for dizziness and headaches.     Objective:   Today's Vitals: BP 125/60 (BP Location: Left Arm, Patient Position: Sitting, Cuff Size: Normal)   Pulse 66   Temp (!) 97.4 F (36.3 C) (Temporal)   Ht _0  (1.575 m)   Wt 138 lb 3.2 oz (62.7 kg)   SpO2 92%   BMI 25.28 kg/m  Vitals with BMI 01/19/2020 01/11/2020 12/14/2019  Height _1  _2  _3   Weight 138 lbs 3 oz 142 lbs 135 lbs 13 oz  BMI 25.27 85.92 92.44  Systolic 628 638 177  Diastolic 60 96 80  Pulse 66 125 82     Physical Exam Vitals reviewed.  Constitutional:      General: She is not in acute distress.    Appearance: Normal appearance.  HENT:     Head: Normocephalic and atraumatic.  Neck:     Vascular: No carotid bruit.  Cardiovascular:     Rate and Rhythm: Normal rate. Rhythm irregular.     Pulses: Normal pulses.     Heart sounds: Normal heart sounds.  Pulmonary:     Effort: Pulmonary effort is normal.     Breath sounds: Normal breath sounds.  Musculoskeletal:     Right shoulder: No swelling or crepitus. Decreased range of motion. Normal strength.     Left shoulder: Crepitus present. No swelling. Decreased range of motion. Normal strength.     Right lower leg: 1+ Pitting Edema present.     Left lower leg: 1+ Pitting Edema present.  Skin:    General: Skin is warm and dry.     Findings: No rash.  Neurological:     General: No focal deficit present.     Mental Status: She is alert and oriented to person, place, and time.  Psychiatric:        Mood and Affect: Mood normal.        Behavior: Behavior  normal.        Judgment: Judgment normal.          Assessment and Plan   1. Bilateral lower extremity edema   2. Hypokalemia   3. Weakness   4. Urinary incontinence, unspecified type   5. Constipation, unspecified constipation type  6. Chronic left shoulder pain   7. Pruritic condition   8. Paroxysmal atrial fibrillation (HCC)      Plan: 1.-2.  Stable, she will continue on her furosemide and potassium supplement.  Will check metabolic panel to evaluate renal function and electrolyte levels.  She is encouraged to call the office if her symptoms worsen.  3.-4.  I will provide patient with order to have pure wick catheter with suction set up in the patient's home.  Hopefully this will be able to be accomplished by a medical equipment store.  5.  We will send prescription for MiraLAX to patient's pharmacy.  Patient will use this daily as needed.  6.  Patient does have significant amount of crepitus to her left shoulder especially when compared to her right shoulder.  Both shoulders do have reduced range of motion due to pain.  Strength appears to be intact as well as sensation.  Discussed possible options for treatment regimen and patient would be interested in seeing specialist for possible joint injection.  We will send referral to Dr. Aline Brochure as she has been evaluated and treated by him in the past for her arthritis of her knees.  7.  Although patient has used clobetasol in the past, I would like to try a lower potent steroid as I do not note any lesions or plaque psoriasis to patient's scalp.  We will send prescription for Capex shampoo that she can apply daily as needed.  8.  Rate is well controlled, she will follow-up with cardiologist as scheduled.     Tests ordered Orders Placed This Encounter  Procedures  . For home use only DME Other see comment  . CMP with eGFR([facility])  . Ambulatory referral to Orthopedic Surgery      Meds ordered this encounter  Medications   . polyethylene glycol powder (GLYCOLAX/MIRALAX) 17 GM/SCOOP powder    Sig: Take 17 g by mouth daily.    Dispense:  3350 g    Refill:  1    Order Specific Question:   Supervising Provider    Answer:   Anastasio Champion, NIMISH C [[id number]]  . Fluocinolone Acetonide 0.01 % SHAM    Sig: Apply 2 tablespoons of shampoo to the scalp daily as needed for up to 2 weeks; leave shampoo on scalp for 5 minutes then rinse.    Dispense:  120 mL    Refill:  2    Order Specific Question:   Supervising Provider    Answer:   Doree Albee [[id number]]    Patient to follow-up in 3 months or sooner as needed.  Ailene Ards, NP

## 2020-01-20 LAB — COMPLETE METABOLIC PANEL WITH GFR
AG Ratio: 1.2 (calc) (ref 1.0–2.5)
ALT: 18 U/L (ref 6–29)
AST: 23 U/L (ref 10–35)
Albumin: 3.6 g/dL (ref 3.6–5.1)
Alkaline phosphatase (APISO): 83 U/L (ref 37–153)
BUN: 17 mg/dL (ref 7–25)
CO2: 24 mmol/L (ref 20–32)
Calcium: 9.1 mg/dL (ref 8.6–10.4)
Chloride: 104 mmol/L (ref 98–110)
Creat: 0.84 mg/dL (ref 0.60–0.88)
GFR, Est African American: 71 mL/min/{1.73_m2} (ref >=60)
GFR, Est Non African American: 62 mL/min/{1.73_m2} (ref >=60)
Globulin: 3 g/dL (calc) (ref 1.9–3.7)
Glucose, Bld: 120 mg/dL — ABNORMAL HIGH (ref 65–99)
Potassium: 4.6 mmol/L (ref 3.5–5.3)
Sodium: 140 mmol/L (ref 135–146)
Total Bilirubin: 0.7 mg/dL (ref 0.2–1.2)
Total Protein: 6.6 g/dL (ref 6.1–8.1)

## 2020-01-21 ENCOUNTER — Telehealth: Payer: Self-pay | Admitting: Cardiology

## 2020-01-21 NOTE — Telephone Encounter (Signed)
Patient calling the office for samples of medication:   1.  What medication and dosage are you requesting samples for?  XARELTO  2.  Are you currently out of this medication?

## 2020-01-24 MED ORDER — RIVAROXABAN 15 MG PO TABS: 15.0000 mg | ORAL_TABLET | Freq: Every day | ORAL | 0 refills | Status: DC

## 2020-01-25 ENCOUNTER — Ambulatory Visit: Payer: Medicare HMO

## 2020-01-25 ENCOUNTER — Other Ambulatory Visit: Payer: Self-pay

## 2020-01-25 ENCOUNTER — Ambulatory Visit: Payer: Medicare HMO | Admitting: Orthopaedic Surgery

## 2020-01-25 ENCOUNTER — Encounter: Payer: Self-pay | Admitting: Orthopaedic Surgery

## 2020-01-25 VITALS — BP 121/67 | HR 65 | Ht 62.0 in | Wt 138.0 lb

## 2020-01-25 DIAGNOSIS — M25511 Pain in right shoulder: Secondary | ICD-10-CM

## 2020-01-25 DIAGNOSIS — G8929 Other chronic pain: Secondary | ICD-10-CM | POA: Diagnosis not present

## 2020-01-25 NOTE — Progress Notes (Signed)
Patient Sara Garcia, female DOB:08/01/1928, 86 y.o. OV:[id number]  Chief Complaint  Patient presents with  . Shoulder Pain    right     HPI  Sara Garcia is a 86 y.o. female who has developed pain in the right shoulder over the last month.  She lifted a heavy bed rail a month ago and has had pain since then. She has no numbness, no redness.  Her motion is good but painful.  She has tried Tylenol and ice/heat.  She is on Xarelto and cannot take NSAIDs.   Body mass index is 25.24 kg/m.  ROS  Review of Systems  Constitutional: Positive for activity change.  Respiratory: Positive for shortness of breath.   Musculoskeletal: Positive for arthralgias.  All other systems reviewed and are negative.   All other systems reviewed and are negative.  The following is a summary of the past history medically, past history surgically, known current medicines, social history and family history.  This information is gathered electronically by the computer from prior information and documentation.  I review this each visit and have found including this information at this point in the chart is beneficial and informative.    Past Medical History:  Diagnosis Date  . Aneurysm ([place])    groin  . Arthritis   . Asthma   . DVT (deep venous thrombosis) ([place]) 2011/2012  . Esophageal dysphagia    limited food and pill  . Esophageal reflux   . Essential hypertension, benign   . GERD (gastroesophageal reflux disease)   . Osteoporosis   . Overactive bladder   . Paroxysmal atrial fibrillation (HCC)    a. on Xarelto for anticoagulation  . 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 Stroke ([place])   . Unspecified prolapse of vaginal walls     Past Surgical History:  Procedure Laterality Date  . ABDOMINAL HYSTERECTOMY    . COLONOSCOPY W/ BIOPSIES  02/01/2004   RMR: Anal papilla with normal rectum/Sigmoid diverticula/Small polyps in the cecum removed as described above. Inflammatory polyp  . ESOPHAGEAL DILATION      unknown year per patient.  . ESOPHAGEAL DILATION N/A 02/07/2015   RMR: Normal esophagus status post maloney dilation. Small hiatal hernia.   Marland Kitchen ESOPHAGOGASTRODUODENOSCOPY     unknown year per patient  . ESOPHAGOGASTRODUODENOSCOPY N/A 02/07/2015   RMR: Normal esophagus  status post  Maloney dilation. Small hiatal hernia  . FALSE ANEURYSM REPAIR Left 09/23/2017   Procedure: REPAIR FALSE ANEURYSM COMMON LEFT FEMORAL ARTERY WITH OSTEOTOMY OF BONE FRAGEMENT;  Surgeon: Angelia Mould, MD;  Location: [facility];  Service: Vascular;  Laterality: Left;  . FEMUR IM NAIL Left 06/11/2017   Procedure: INTRAMEDULLARY (IM) NAIL FEMORAL;  Surgeon: Rod Can, MD;  Location: [facility];  Service: Orthopedics;  Laterality: Left;  . LEG SURGERY Left   . NASAL SEPTUM SURGERY    . TONSILLECTOMY    . WRIST FRACTURE SURGERY      Family History  Problem Relation Age of Onset  . Heart disease Mother   . Stroke Mother   . Emphysema Father   . COPD Sister   . Liver disease Brother   . Colon polyps Neg Hx   . Colon cancer Neg Hx     Social History Social History   Tobacco Use  . Smoking status: Never Smoker  . Smokeless tobacco: Never Used  Substance Use Topics  . Alcohol use: No    Alcohol/week: 0.0 standard drinks  . Drug use: No  Allergies  Allergen Reactions  . Latex Itching and Rash  . Adhesive [Tape] Other (See Comments)    Tears skin  . Betadine [Povidone Iodine]     blisters  . Ciprofloxacin Nausea And Vomiting  . Codeine Nausea And Vomiting    Upsets stomach, nightmares  . Penicillins Hives    Has patient had a PCN reaction causing immediate rash, facial/tongue/throat swelling, SOB or lightheadedness with hypotension: yes Has patient had a PCN reaction causing severe rash involving mucus membranes or skin necrosis: yes Has patient had a PCN reaction that required hospitalization: no Has patient had a PCN reaction occurring within the last 10 years: no If all of the above  answers are "NO", then may proceed with Cephalosporin use.   . Sulfa Antibiotics Hives  . Wellbutrin [Bupropion]     Reaction unknown  . Aloe Rash    Current Outpatient Medications  Medication Sig Dispense Refill  . acetaminophen (TYLENOL) 650 MG CR tablet Take 650 mg by mouth as needed for pain.     . cholecalciferol (VITAMIN D) 1000 units tablet Take 1,000 Units by mouth 3 (three) times daily.    Marland Kitchen diltiazem (CARDIZEM SR) 60 MG 12 hr capsule Take 60 mg by mouth in the morning, at noon, and at bedtime.    . DULoxetine (CYMBALTA) 60 MG capsule TAKE 1 CAPSULE BY MOUTH TWICE DAILY 60 capsule 2  . esomeprazole (NEXIUM) 20 MG capsule TAKE 1 CAPSULE BY MOUTH ONCE DAILY BEFORE BREAKFAST 30 capsule 10  . Fluocinolone Acetonide 0.01 % SHAM Apply 2 tablespoons of shampoo to the scalp daily as needed for up to 2 weeks; leave shampoo on scalp for 5 minutes then rinse. 120 mL 2  . Fluticasone-Salmeterol (ADVAIR) 250-50 MCG/DOSE AEPB Inhale 1 puff into the lungs daily.     . furosemide (LASIX) 20 MG tablet Take 1 tablet (20 mg total) by mouth 2 (two) times daily. 30 tablet 2  . metoprolol tartrate (LOPRESSOR) 25 MG tablet Take 1 tablet (25 mg total) by mouth 2 (two) times daily. 180 tablet 1  . NP THYROID 60 MG tablet TAKE 1 TABLET BY MOUTH DAILY 30 tablet 2  . ondansetron (ZOFRAN-ODT) 4 MG disintegrating tablet Take 4 mg by mouth as needed.     . polyethylene glycol powder (GLYCOLAX/MIRALAX) 17 GM/SCOOP powder Take 17 g by mouth daily. 3350 g 1  . Rivaroxaban (XARELTO) 15 MG TABS tablet Take 1 tablet (15 mg total) by mouth daily with supper. 28 tablet 0  . vitamin B-12 (CYANOCOBALAMIN) 1000 MCG tablet Take 1,000 mcg by mouth daily.     . Potassium Chloride ER 20 MEQ TBCR Take 20 mEq by mouth in the morning and at bedtime. 60 tablet 2   No current facility-administered medications for this visit.     Physical Exam  Blood pressure 121/67, pulse 65, height 5\' 2"  (1.575 m), weight 138 lb (62.6  kg).  Constitutional: overall normal hygiene, normal nutrition, well developed, normal grooming, normal body habitus. Assistive device:none  Musculoskeletal: gait and station Limp none, muscle tone and strength are normal, no tremors or atrophy is present.  .  Neurological: coordination overall normal.  Deep tendon reflex/nerve stretch intact.  Sensation normal.  Cranial nerves II-XII intact.   Skin:   Normal overall no scars, lesions, ulcers or rashes. No psoriasis.  Psychiatric: Alert and oriented x 3.  Recent memory intact, remote memory unclear.  Normal mood and affect. Well groomed.  Good eye contact.  Cardiovascular: overall  no swelling, no varicosities, no edema bilaterally, normal temperatures of the legs and arms, no clubbing, cyanosis and good capillary refill.  Lymphatic: palpation is normal.  Right shoulder is tender.  Nearly full ROM with pain in the extremes.  NV intact.  Grips normal.  No effusion.  All other systems reviewed and are negative   The patient has been educated about the nature of the problem(s) and counseled on treatment options.  The patient appeared to understand what I have discussed and is in agreement with it.  Encounter Diagnosis  Name Primary?  . Chronic right shoulder pain Yes   X-rays were done of the right shoulder, reported separately.  PLAN Call if any problems.  Precautions discussed.  Continue current medications.   PROCEDURE NOTE:  The patient request injection, verbal consent was obtained.  The right shoulder was prepped appropriately after time out was performed.   Sterile technique was observed and injection of 1 cc of Depo-Medrol 40 mg with several cc's of plain xylocaine. Anesthesia was provided by ethyl chloride and a 20-gauge needle was used to inject the shoulder area. A posterior approach was used.  The injection was tolerated well.  A band aid dressing was applied.  The patient was advised to apply ice later today and  tomorrow to the injection sight as needed.   Return to clinic 3 weeks   Electronically Signed Sanjuana Kava, MD 4/13/202110:33 AM

## 2020-01-27 ENCOUNTER — Telehealth: Payer: Self-pay | Admitting: Cardiology

## 2020-01-27 ENCOUNTER — Telehealth (INDEPENDENT_AMBULATORY_CARE_PROVIDER_SITE_OTHER): Payer: Self-pay

## 2020-01-27 ENCOUNTER — Ambulatory Visit: Payer: Medicare HMO | Admitting: Orthopaedic Surgery

## 2020-01-27 DIAGNOSIS — L299 Pruritus, unspecified: Secondary | ICD-10-CM

## 2020-01-27 DIAGNOSIS — E876 Hypokalemia: Secondary | ICD-10-CM

## 2020-01-27 DIAGNOSIS — K5900 Constipation, unspecified: Secondary | ICD-10-CM

## 2020-01-27 MED ORDER — POTASSIUM CHLORIDE ER 20 MEQ PO TBCR: 20.0000 meq | EXTENDED_RELEASE_TABLET | Freq: Two times a day (BID) | ORAL | 2 refills | Status: DC

## 2020-01-27 MED ORDER — METOPROLOL TARTRATE 25 MG PO TABS: 25.0000 mg | ORAL_TABLET | Freq: Two times a day (BID) | ORAL | 3 refills | Status: DC

## 2020-01-27 MED ORDER — RIVAROXABAN 15 MG PO TABS: 15.0000 mg | ORAL_TABLET | Freq: Every day | ORAL | 6 refills | Status: DC

## 2020-01-27 MED ORDER — METOPROLOL TARTRATE 25 MG PO TABS: 25.0000 mg | ORAL_TABLET | Freq: Two times a day (BID) | ORAL | 1 refills | Status: DC

## 2020-01-27 MED ORDER — DULOXETINE HCL 60 MG PO CPEP: 60.0000 mg | ORAL_CAPSULE | Freq: Two times a day (BID) | ORAL | 2 refills | Status: DC

## 2020-01-27 MED ORDER — POLYETHYLENE GLYCOL 3350 17 GM/SCOOP PO POWD: 17.0000 g | Freq: Every day | ORAL | 1 refills | Status: AC

## 2020-01-27 MED ORDER — DILTIAZEM HCL ER 60 MG PO CP12: 60.0000 mg | ORAL_CAPSULE | Freq: Three times a day (TID) | ORAL | 2 refills | Status: DC

## 2020-01-27 MED ORDER — FUROSEMIDE 20 MG PO TABS: 20.0000 mg | ORAL_TABLET | Freq: Two times a day (BID) | ORAL | 2 refills | Status: DC

## 2020-01-27 MED ORDER — ONDANSETRON 4 MG PO TBDP: 4.0000 mg | ORAL_TABLET | ORAL | 3 refills | Status: DC | PRN

## 2020-01-27 MED ORDER — POTASSIUM CHLORIDE ER 20 MEQ PO TBCR: 20.0000 meq | EXTENDED_RELEASE_TABLET | Freq: Two times a day (BID) | ORAL | 1 refills | Status: DC

## 2020-01-27 MED ORDER — FLUTICASONE-SALMETEROL 250-50 MCG/DOSE IN AEPB: 1.0000 | INHALATION_SPRAY | Freq: Every day | RESPIRATORY_TRACT | 3 refills | Status: DC

## 2020-01-27 MED ORDER — FLUOCINOLONE ACETONIDE 0.01 % EX SHAM: MEDICATED_SHAMPOO | CUTANEOUS | 2 refills | Status: DC

## 2020-01-27 MED ORDER — THYROID 60 MG PO TABS: 60.0000 mg | ORAL_TABLET | Freq: Every day | ORAL | 3 refills | Status: DC

## 2020-01-27 MED ORDER — FUROSEMIDE 20 MG PO TABS: 20.0000 mg | ORAL_TABLET | Freq: Two times a day (BID) | ORAL | 1 refills | Status: DC

## 2020-01-27 MED ORDER — ESOMEPRAZOLE MAGNESIUM 20 MG PO CPDR: DELAYED_RELEASE_CAPSULE | ORAL | 3 refills | Status: DC

## 2020-01-27 MED ORDER — DILTIAZEM HCL ER 60 MG PO CP12: 60.0000 mg | ORAL_CAPSULE | Freq: Three times a day (TID) | ORAL | 1 refills | Status: DC

## 2020-01-27 MED ORDER — VITAMIN B-12 1000 MCG PO TABS: 1000.0000 ug | ORAL_TABLET | Freq: Every day | ORAL | 3 refills | Status: DC

## 2020-01-27 NOTE — Telephone Encounter (Signed)
Please transfer medication to [facility] in [place] , please Ricky back to confirm

## 2020-01-27 NOTE — Telephone Encounter (Signed)
Please send all cardiac medications to [facility] in [place]

## 2020-01-31 ENCOUNTER — Telehealth (INDEPENDENT_AMBULATORY_CARE_PROVIDER_SITE_OTHER): Payer: Self-pay

## 2020-01-31 NOTE — Telephone Encounter (Signed)
Please call the pharmacy and see if there is a lower cost alternative for this shampoo.  Thanks

## 2020-01-31 NOTE — Telephone Encounter (Signed)
The Fluocinolone oil is the lower cot item; will be put on hold. She just had fill 4/16. Cant get again until 02/28/20.[facility]

## 2020-02-03 ENCOUNTER — Institutional Professional Consult (permissible substitution): Payer: Medicare HMO | Admitting: Neurology

## 2020-02-03 ENCOUNTER — Telehealth: Payer: Self-pay

## 2020-02-03 NOTE — Telephone Encounter (Signed)
Patient no show for appt today. 

## 2020-02-08 ENCOUNTER — Encounter: Payer: Self-pay | Admitting: Neurology

## 2020-02-15 ENCOUNTER — Ambulatory Visit: Payer: Medicare HMO | Admitting: Orthopaedic Surgery

## 2020-02-15 ENCOUNTER — Other Ambulatory Visit: Payer: Self-pay

## 2020-02-15 ENCOUNTER — Encounter: Payer: Self-pay | Admitting: Orthopaedic Surgery

## 2020-02-15 VITALS — BP 143/72 | HR 60 | Ht 62.0 in | Wt 138.0 lb

## 2020-02-15 DIAGNOSIS — G8929 Other chronic pain: Secondary | ICD-10-CM | POA: Diagnosis not present

## 2020-02-15 DIAGNOSIS — M25511 Pain in right shoulder: Secondary | ICD-10-CM

## 2020-02-15 MED ORDER — TRAMADOL HCL 50 MG PO TABS: 50.0000 mg | ORAL_TABLET | Freq: Four times a day (QID) | ORAL | 0 refills | Status: AC | PRN

## 2020-02-15 NOTE — Progress Notes (Signed)
PROCEDURE NOTE:  The patient request injection, verbal consent was obtained.  The right shoulder was prepped appropriately after time out was performed.   Sterile technique was observed and injection of 1 cc of Depo-Medrol 40 mg with several cc's of plain xylocaine. Anesthesia was provided by ethyl chloride and a 20-gauge needle was used to inject the shoulder area. A posterior approach was used.  The injection was tolerated well.  A band aid dressing was applied.  The patient was advised to apply ice later today and tomorrow to the injection sight as needed.  I will set up [facility].  Return in one month.  I have reviewed the [facility] web site prior to prescribing narcotic medicine for this patient.   Electronically Signed Sanjuana Kava, MD 5/4/202110:45 AM

## 2020-02-23 ENCOUNTER — Telehealth: Payer: Self-pay | Admitting: Orthopaedic Surgery

## 2020-02-23 NOTE — Telephone Encounter (Addendum)
Elizabeth from [facility] called this morning stating that they are unable to take this patient because her insurance is out of network with their company.  If there are any questions, please call Benjamine Mola at [phone]  Please set her up somewhere else  Thanks

## 2020-02-28 ENCOUNTER — Other Ambulatory Visit: Payer: Self-pay | Admitting: *Deleted

## 2020-02-28 MED ORDER — RIVAROXABAN 15 MG PO TABS: 15.0000 mg | ORAL_TABLET | Freq: Every day | ORAL | 0 refills | Status: DC

## 2020-03-14 ENCOUNTER — Encounter: Payer: Self-pay | Admitting: Orthopaedic Surgery

## 2020-03-14 ENCOUNTER — Ambulatory Visit (INDEPENDENT_AMBULATORY_CARE_PROVIDER_SITE_OTHER): Payer: Medicare HMO | Admitting: Orthopaedic Surgery

## 2020-03-14 ENCOUNTER — Other Ambulatory Visit: Payer: Self-pay

## 2020-03-14 VITALS — Ht 62.0 in | Wt 138.0 lb

## 2020-03-14 DIAGNOSIS — G8929 Other chronic pain: Secondary | ICD-10-CM | POA: Diagnosis not present

## 2020-03-14 DIAGNOSIS — M25511 Pain in right shoulder: Secondary | ICD-10-CM | POA: Diagnosis not present

## 2020-03-14 NOTE — Progress Notes (Signed)
Patient Sara Garcia, female DOB:1928/08/25, 86 y.o. [id number]  Chief Complaint  Patient presents with  . Shoulder Pain    HPI  Sara Garcia is a 86 y.o. female who has pain of the right shoulder.  The injection did not help.  She has pain most of the time. She cannot take NSAIDs.  She is limited on pain medicine.  She has tried rubs, ice, heat.  PT could not come to the house to see her.  She has no way to PT at the hospital.  I have told her to try pain patches to the shoulder.   Body mass index is 25.24 kg/m.  ROS  Review of Systems  Constitutional: Positive for activity change.  Respiratory: Positive for shortness of breath.   Musculoskeletal: Positive for arthralgias.  All other systems reviewed and are negative.   All other systems reviewed and are negative.  The following is a summary of the past history medically, past history surgically, known current medicines, social history and family history.  This information is gathered electronically by the computer from prior information and documentation.  I review this each visit and have found including this information at this point in the chart is beneficial and informative.    Past Medical History:  Diagnosis Date  . 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 Arthritis   . Asthma   . DVT (deep venous thrombosis) ([place]) 2011/2012  . Esophageal dysphagia    limited food and pill  . Esophageal reflux   . Essential hypertension, benign   . GERD (gastroesophageal reflux disease)   . Osteoporosis   . Overactive bladder   . Paroxysmal atrial fibrillation (HCC)    a. on Xarelto for anticoagulation  . 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 Stroke ([place])   . Unspecified prolapse of vaginal walls     Past Surgical History:  Procedure Laterality Date  . ABDOMINAL HYSTERECTOMY    . COLONOSCOPY W/ BIOPSIES  02/01/2004   RMR: Anal papilla with normal rectum/Sigmoid diverticula/Small polyps in the cecum removed as described above. Inflammatory  polyp  . ESOPHAGEAL DILATION     unknown year per patient.  . ESOPHAGEAL DILATION N/A 02/07/2015   RMR: Normal esophagus status post maloney dilation. Small hiatal hernia.   Marland Kitchen ESOPHAGOGASTRODUODENOSCOPY     unknown year per patient  . ESOPHAGOGASTRODUODENOSCOPY N/A 02/07/2015   RMR: Normal esophagus  status post  Maloney dilation. Small hiatal hernia  . FALSE ANEURYSM REPAIR Left 09/23/2017   Procedure: REPAIR FALSE ANEURYSM COMMON LEFT FEMORAL ARTERY WITH OSTEOTOMY OF BONE FRAGEMENT;  Surgeon: Angelia Mould, MD;  Location: [facility];  Service: Vascular;  Laterality: Left;  . FEMUR IM NAIL Left 06/11/2017   Procedure: INTRAMEDULLARY (IM) NAIL FEMORAL;  Surgeon: Rod Can, MD;  Location: [facility];  Service: Orthopedics;  Laterality: Left;  . LEG SURGERY Left   . NASAL SEPTUM SURGERY    . TONSILLECTOMY    . WRIST FRACTURE SURGERY      Family History  Problem Relation Age of Onset  . Heart disease Mother   . Stroke Mother   . Emphysema Father   . COPD Sister   . Liver disease Brother   . Colon polyps Neg Hx   . Colon cancer Neg Hx     Social History Social History   Tobacco Use  . Smoking status: Never Smoker  . Smokeless tobacco: Never Used  Substance Use Topics  . Alcohol use: No    Alcohol/week:  0.0 standard drinks  . Drug use: No    Allergies  Allergen Reactions  . Latex Itching and Rash  . Adhesive [Tape] Other (See Comments)    Tears skin  . Betadine [Povidone Iodine]     blisters  . Ciprofloxacin Nausea And Vomiting  . Codeine Nausea And Vomiting    Upsets stomach, nightmares  . Penicillins Hives    Has patient had a PCN reaction causing immediate rash, facial/tongue/throat swelling, SOB or lightheadedness with hypotension: yes Has patient had a PCN reaction causing severe rash involving mucus membranes or skin necrosis: yes Has patient had a PCN reaction that required hospitalization: no Has patient had a PCN reaction occurring within the last 10  years: no If all of the above answers are "NO", then may proceed with Cephalosporin use.   . Sulfa Antibiotics Hives  . Wellbutrin [Bupropion]     Reaction unknown  . Aloe Rash    Current Outpatient Medications  Medication Sig Dispense Refill  . acetaminophen (TYLENOL) 650 MG CR tablet Take 650 mg by mouth as needed for pain.     . cholecalciferol (VITAMIN D) 1000 units tablet Take 1,000 Units by mouth 3 (three) times daily.    Marland Kitchen diltiazem (CARDIZEM SR) 60 MG 12 hr capsule Take 1 capsule (60 mg total) by mouth in the morning, at noon, and at bedtime. 270 capsule 1  . DULoxetine (CYMBALTA) 60 MG capsule Take 1 capsule (60 mg total) by mouth 2 (two) times daily. 60 capsule 2  . esomeprazole (NEXIUM) 20 MG capsule TAKE 1 CAPSULE BY MOUTH ONCE DAILY BEFORE BREAKFAST 30 capsule 3  . Fluocinolone Acetonide 0.01 % SHAM Apply 2 tablespoons of shampoo to the scalp daily as needed for up to 2 weeks; leave shampoo on scalp for 5 minutes then rinse. 120 mL 2  . Fluticasone-Salmeterol (ADVAIR) 250-50 MCG/DOSE AEPB Inhale 1 puff into the lungs daily. 60 each 3  . furosemide (LASIX) 20 MG tablet Take 1 tablet (20 mg total) by mouth 2 (two) times daily. 180 tablet 1  . metoprolol tartrate (LOPRESSOR) 25 MG tablet Take 1 tablet (25 mg total) by mouth 2 (two) times daily. 180 tablet 1  . ondansetron (ZOFRAN-ODT) 4 MG disintegrating tablet Take 1 tablet (4 mg total) by mouth as needed. 20 tablet 3  . polyethylene glycol powder (GLYCOLAX/MIRALAX) 17 GM/SCOOP powder Take 17 g by mouth daily. 3350 g 1  . Rivaroxaban (XARELTO) 15 MG TABS tablet Take 1 tablet (15 mg total) by mouth daily with supper. 28 tablet 0  . thyroid (NP THYROID) 60 MG tablet Take 1 tablet (60 mg total) by mouth daily. 30 tablet 3  . vitamin B-12 (CYANOCOBALAMIN) 1000 MCG tablet Take 1 tablet (1,000 mcg total) by mouth daily. 30 tablet 3  . Potassium Chloride ER 20 MEQ TBCR Take 20 mEq by mouth in the morning and at bedtime. 180 tablet 1    No current facility-administered medications for this visit.     Physical Exam  Height 5\' 2"  (1.575 m), weight 138 lb (62.6 kg).  Constitutional: overall normal hygiene, normal nutrition, well developed, normal grooming, normal body habitus. Assistive device:wheelchair  Musculoskeletal: gait and station Limp has great difficulty in standing and I did not do, muscle tone and strength are normal, no tremors or atrophy is present.  .  Neurological: coordination overall normal.  Deep tendon reflex/nerve stretch intact.  Sensation normal.  Cranial nerves II-XII intact.   Skin:   Normal overall no scars,  lesions, ulcers or rashes. No psoriasis.  Psychiatric: Alert and oriented x 3.  Recent memory intact, remote memory unclear.  Normal mood and affect. Well groomed.  Good eye contact.  Cardiovascular: overall no swelling, no varicosities, no edema bilaterally, normal temperatures of the legs and arms, no clubbing, cyanosis and good capillary refill.  Lymphatic: palpation is normal.  Right shoulder with limited painful ROM.  Crepitus.  NV intact.  No effusion, no redness.  All other systems reviewed and are negative   The patient has been educated about the nature of the problem(s) and counseled on treatment options.  The patient appeared to understand what I have discussed and is in agreement with it.  Encounter Diagnosis  Name Primary?  . Chronic right shoulder pain Yes    PLAN Call if any problems.  Precautions discussed.  Continue current medications.   Return to clinic 1 month   Electronically Signed Sanjuana Kava, MD 6/1/202111:16 AM

## 2020-03-23 ENCOUNTER — Encounter (INDEPENDENT_AMBULATORY_CARE_PROVIDER_SITE_OTHER): Payer: Self-pay | Admitting: Internal Medicine

## 2020-03-23 ENCOUNTER — Other Ambulatory Visit: Payer: Self-pay

## 2020-03-23 ENCOUNTER — Ambulatory Visit (INDEPENDENT_AMBULATORY_CARE_PROVIDER_SITE_OTHER): Payer: Medicare HMO | Admitting: Internal Medicine

## 2020-03-23 VITALS — BP 130/80 | HR 61 | Temp 97.5°F | Ht 62.0 in | Wt 141.2 lb

## 2020-03-23 DIAGNOSIS — I10 Essential (primary) hypertension: Secondary | ICD-10-CM

## 2020-03-23 DIAGNOSIS — E876 Hypokalemia: Secondary | ICD-10-CM | POA: Diagnosis not present

## 2020-03-23 DIAGNOSIS — I480 Paroxysmal atrial fibrillation: Secondary | ICD-10-CM

## 2020-03-23 NOTE — Progress Notes (Signed)
Metrics: Intervention Frequency ACO  Documented Smoking Status Yearly  Screened one or more times in 24 months  Cessation Counseling or  Active cessation medication Past 24 months  Past 24 months   Guideline developer: UpToDate (See UpToDate for funding source) Date Released: 2014       Wellness Office Visit  Subjective:  Patient ID: Sara Garcia, female    DOB: 19-Aug-1928  Age: 86 y.o. MRN: [id number]  CC: This lady comes in for follow-up of hypertension, paroxysmal atrial fibrillation, hypokalemia. HPI  She has recently been treated by orthopedics for right shoulder pain.  She had intra-articular injections which have not been very successful.  She uses a lidocaine patch which does seem to give relief.  Her son, who is with her today, is asking about CBD oil. Otherwise, overall, she feels fairly well.  She has no other major complaints at the present time. She continues on metoprolol for hypertension and continues with Xarelto for anticoagulation due to her paroxysmal atrial fibrillation. Past Medical History:  Diagnosis Date  . 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 Arthritis   . Asthma   . DVT (deep venous thrombosis) ([place]) 2011/2012  . Esophageal dysphagia    limited food and pill  . Esophageal reflux   . Essential hypertension, benign   . GERD (gastroesophageal reflux disease)   . Osteoporosis   . Overactive bladder   . Paroxysmal atrial fibrillation (HCC)    a. on Xarelto for anticoagulation  . Pseudoaneurysm ([place]) 09/2017  . Stroke ([place])   . Unspecified prolapse of vaginal walls    Past Surgical History:  Procedure Laterality Date  . ABDOMINAL HYSTERECTOMY    . COLONOSCOPY W/ BIOPSIES  02/01/2004   RMR: Anal papilla with normal rectum/Sigmoid diverticula/Small polyps in the cecum removed as described above. Inflammatory polyp  . ESOPHAGEAL DILATION     unknown year per patient.  . ESOPHAGEAL DILATION N/A 02/07/2015   RMR: Normal esophagus status post maloney dilation. Small  hiatal hernia.   Marland Kitchen ESOPHAGOGASTRODUODENOSCOPY     unknown year per patient  . ESOPHAGOGASTRODUODENOSCOPY N/A 02/07/2015   RMR: Normal esophagus  status post  Maloney dilation. Small hiatal hernia  . FALSE ANEURYSM REPAIR Left 09/23/2017   Procedure: REPAIR FALSE ANEURYSM COMMON LEFT FEMORAL ARTERY WITH OSTEOTOMY OF BONE FRAGEMENT;  Surgeon: Angelia Mould, MD;  Location: [facility];  Service: Vascular;  Laterality: Left;  . FEMUR IM NAIL Left 06/11/2017   Procedure: INTRAMEDULLARY (IM) NAIL FEMORAL;  Surgeon: Rod Can, MD;  Location[facility];  Service: Orthopedics;  Laterality: Left;  . LEG SURGERY Left   . NASAL SEPTUM SURGERY    . TONSILLECTOMY    . WRIST FRACTURE SURGERY       Family History  Problem Relation Age of Onset  . Heart disease Mother   . Stroke Mother   . Emphysema Father   . COPD Sister   . Liver disease Brother   . Colon polyps Neg Hx   . Colon cancer Neg Hx     Social History   Social History Narrative   Married for 21 years in second marriage.First marriage lasted 33 years.Lives with husband.   Social History   Tobacco Use  . Smoking status: Never Smoker  . Smokeless tobacco: Never Used  Substance Use Topics  . Alcohol use: No    Alcohol/week: 0.0 standard drinks    Current Meds  Medication Sig  . acetaminophen (TYLENOL) 650 MG CR tablet Take 650  mg by mouth as needed for pain.   . cholecalciferol (VITAMIN D) 1000 units tablet Take 1,000 Units by mouth 3 (three) times daily.  Marland Kitchen diltiazem (CARDIZEM SR) 60 MG 12 hr capsule Take 1 capsule (60 mg total) by mouth in the morning, at noon, and at bedtime.  . DULoxetine (CYMBALTA) 60 MG capsule Take 1 capsule (60 mg total) by mouth 2 (two) times daily.  Marland Kitchen esomeprazole (NEXIUM) 20 MG capsule TAKE 1 CAPSULE BY MOUTH ONCE DAILY BEFORE BREAKFAST  . Fluocinolone Acetonide 0.01 % SHAM Apply 2 tablespoons of shampoo to the scalp daily as needed for up to 2 weeks; leave shampoo on scalp for 5 minutes then  rinse.  Marland Kitchen Fluticasone-Salmeterol (ADVAIR) 250-50 MCG/DOSE AEPB Inhale 1 puff into the lungs daily.  . furosemide (LASIX) 20 MG tablet Take 1 tablet (20 mg total) by mouth 2 (two) times daily.  . Lidocaine 1.8 % PTCH Apply 1 patch topically.  . metoprolol tartrate (LOPRESSOR) 25 MG tablet Take 1 tablet (25 mg total) by mouth 2 (two) times daily.  . ondansetron (ZOFRAN-ODT) 4 MG disintegrating tablet Take 1 tablet (4 mg total) by mouth as needed.  . polyethylene glycol powder (GLYCOLAX/MIRALAX) 17 GM/SCOOP powder Take 17 g by mouth daily.  . Potassium Chloride ER 20 MEQ TBCR Take 20 mEq by mouth in the morning and at bedtime.  . Rivaroxaban (XARELTO) 15 MG TABS tablet Take 1 tablet (15 mg total) by mouth daily with supper.  . thyroid (NP THYROID) 60 MG tablet Take 1 tablet (60 mg total) by mouth daily.  . vitamin B-12 (CYANOCOBALAMIN) 1000 MCG tablet Take 1 tablet (1,000 mcg total) by mouth daily.       Depression screen [facility] 2/9 01/19/2020 10/21/2019 09/07/2019  Decreased Interest 0 0 3  Down, Depressed, Hopeless 0 0 3  PHQ - 2 Score 0 0 6  Altered sleeping - - 2  Tired, decreased energy - - 3  Change in appetite - - 1  Feeling bad or failure about yourself  - - 0  Trouble concentrating - - 1  Moving slowly or fidgety/restless - - 0  Suicidal thoughts - - 0  PHQ-9 Score - - 13  Some recent data might be hidden     Objective:   Today's Vitals: BP 130/80 (BP Location: Left Arm, Patient Position: Sitting, Cuff Size: Normal)   Pulse 61   Temp (!) 97.5 F (36.4 C) (Temporal)   Ht 5\' 2"  (1.575 m)   Wt 141 lb 3.2 oz (64 kg)   SpO2 98%   BMI 25.83 kg/m  Vitals with BMI 03/23/2020 03/14/2020 02/15/2020  Height 5\' 2"  5\' 2"  5\' 2"   Weight 141 lbs 3 oz 138 lbs 138 lbs  BMI 25.82 53.61 44.31  Systolic 540 - 086  Diastolic 80 - 72  Pulse 61 - 60     Physical Exam  She looks systemically well.  Weight is stable.  Blood pressure is well controlled.  She is in sinus  rhythm.     Assessment   1. Hypokalemia   2. Essential hypertension   3. Paroxysmal atrial fibrillation (HCC)       Tests ordered No orders of the defined types were placed in this encounter.    Plan: 1. She will continue potassium supplementation for hypokalemia and her last potassium was in a good range. 2. She will continue with metoprolol for hypertension which seems to be controlling her blood pressure well. 3. She will continue with  anticoagulation therapy with Xarelto for paroxysmal atrial fibrillation. 4. Follow-up as scheduled in December for an annual Medicare wellness visit with Judson Roch.   No orders of the defined types were placed in this encounter.   Doree Albee, MD

## 2020-04-04 ENCOUNTER — Ambulatory Visit (INDEPENDENT_AMBULATORY_CARE_PROVIDER_SITE_OTHER): Payer: Medicare HMO | Admitting: Gastroenterology

## 2020-04-04 ENCOUNTER — Other Ambulatory Visit: Payer: Self-pay

## 2020-04-04 ENCOUNTER — Encounter: Payer: Self-pay | Admitting: Gastroenterology

## 2020-04-04 DIAGNOSIS — R1319 Other dysphagia: Secondary | ICD-10-CM

## 2020-04-04 DIAGNOSIS — R1314 Dysphagia, pharyngoesophageal phase: Secondary | ICD-10-CM

## 2020-04-04 DIAGNOSIS — R1310 Dysphagia, unspecified: Secondary | ICD-10-CM | POA: Diagnosis not present

## 2020-04-04 DIAGNOSIS — K5900 Constipation, unspecified: Secondary | ICD-10-CM

## 2020-04-04 NOTE — Progress Notes (Signed)
Primary Care Physician:  Doree Albee, MD Primary GI:  Garfield Cornea, MD   Patient Location: Home  Provider Location: [facility] office  Reason for Phone Visit:  Chief Complaint  Patient presents with  . Dysphagia    f/u. Always a problem but learning how to make easier  . Anemia    f/u.    Persons present on the phone encounter, with roles: Patient, myself (provider),Mindy Estudillo CMA(updated meds and allergies)  Total time (minutes) spent on medical discussion: 23 minutes  Due to COVID-19, visit was conducted using telephonic method (no video was available).  Visit was requested by patient.  Virtual Visit via Telephone only  I connected with Sara Garcia on 04/04/20 at 11:30 AM EDT by telephone and verified that I am speaking with the correct person using two identifiers.   I discussed the limitations, risks, security and privacy concerns of performing an evaluation and management service by telephone and the availability of in person appointments. I also discussed with the patient that there may be a patient responsible charge related to this service. The patient expressed understanding and agreed to proceed.   HPI:   Patient is a pleasant 86 y/o female who presents for telephone visit regarding dysphagia, constipation, anemia.  Last visit in March via telephone encounter.  History of heme positive stool noted in January 2020.  Both her and her daughter declined patient having EGD and colonoscopy on several occasions.  Hemoglobin normal since October 2020.  She has chronic dysphagia/oropharyngeal dysphagia.  Takes her pills in applesauce.  Son pures her food.  Swallowing dysfunction previously evaluated with barium pill esophagram and speech therapy evaluation.  Has age-related changes and not a lot of her speech therapy to offer. Last EGD with dilation 01/2015, normal esophagus.   Seen by PCP recently on June 10, weight 141 pounds.  Previously reported weighing about 142  pounds at home in March 2021. No abdominal pain. Complains of constipation. Taking miralax one capful daily. Psyllium fiber daily, doesn't measure how much she takes. BM every day. Sometimes hard/sometimes soft. No melena, brbpr. Swallowing is manageable with pureed food and taking medication in applesauce.   Current Outpatient Medications  Medication Sig Dispense Refill  . acetaminophen (TYLENOL) 650 MG CR tablet Take 650 mg by mouth as needed for pain.     . cholecalciferol (VITAMIN D) 1000 units tablet Take 1,000 Units by mouth 3 (three) times daily.    Marland Kitchen diltiazem (CARDIZEM SR) 60 MG 12 hr capsule Take 1 capsule (60 mg total) by mouth in the morning, at noon, and at bedtime. 270 capsule 1  . DULoxetine (CYMBALTA) 60 MG capsule Take 1 capsule (60 mg total) by mouth 2 (two) times daily. 60 capsule 2  . esomeprazole (NEXIUM) 20 MG capsule TAKE 1 CAPSULE BY MOUTH ONCE DAILY BEFORE BREAKFAST 30 capsule 3  . Fluocinolone Acetonide 0.01 % SHAM Apply 2 tablespoons of shampoo to the scalp daily as needed for up to 2 weeks; leave shampoo on scalp for 5 minutes then rinse. 120 mL 2  . Fluticasone-Salmeterol (ADVAIR) 250-50 MCG/DOSE AEPB Inhale 1 puff into the lungs daily. 60 each 3  . furosemide (LASIX) 20 MG tablet Take 1 tablet (20 mg total) by mouth 2 (two) times daily. 180 tablet 1  . Lidocaine 1.8 % PTCH Apply 1 patch topically.    . metoprolol tartrate (LOPRESSOR) 25 MG tablet Take 1 tablet (25 mg total) by mouth 2 (two) times daily. [place]  tablet 1  . ondansetron (ZOFRAN-ODT) 4 MG disintegrating tablet Take 1 tablet (4 mg total) by mouth as needed. 20 tablet 3  . polyethylene glycol powder (GLYCOLAX/MIRALAX) 17 GM/SCOOP powder Take 17 g by mouth daily. 3350 g 1  . Potassium Chloride ER 20 MEQ TBCR Take 20 mEq by mouth in the morning and at bedtime. 180 tablet 1  . Rivaroxaban (XARELTO) 15 MG TABS tablet Take 1 tablet (15 mg total) by mouth daily with supper. 28 tablet 0  . thyroid (NP THYROID) 60 MG  tablet Take 1 tablet (60 mg total) by mouth daily. 30 tablet 3  . vitamin B-12 (CYANOCOBALAMIN) 1000 MCG tablet Take 1 tablet (1,000 mcg total) by mouth daily. 30 tablet 3   No current facility-administered medications for this visit.    Past Medical History:  Diagnosis Date  . 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 Arthritis   . Asthma   . DVT (deep venous thrombosis) ([place]) 2011/2012  . Esophageal dysphagia    limited food and pill  . Esophageal reflux   . Essential hypertension, benign   . GERD (gastroesophageal reflux disease)   . Osteoporosis   . Overactive bladder   . Paroxysmal atrial fibrillation (H[facility])    a. on Xarelto for anticoagulation  . 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 Stroke ([place])   . Unspecified prolapse of vaginal walls     Past Surgical History:  Procedure Laterality Date  . ABDOMINAL HYSTERECTOMY    . COLONOSCOPY W/ BIOPSIES  02/01/2004   RMR: Anal papilla with normal rectum/Sigmoid diverticula/Small polyps in the cecum removed as described above. Inflammatory polyp  . ESOPHAGEAL DILATION     unknown year per patient.  . ESOPHAGEAL DILATION N/A 02/07/2015   RMR: Normal esophagus status post maloney dilation. Small hiatal hernia.   Marland Kitchen ESOPHAGOGASTRODUODENOSCOPY     unknown year per patient  . ESOPHAGOGASTRODUODENOSCOPY N/A 02/07/2015   RMR: Normal esophagus  status post  Maloney dilation. Small hiatal hernia  . FALSE ANEURYSM REPAIR Left 09/23/2017   Procedure: REPAIR FALSE ANEURYSM COMMON LEFT FEMORAL ARTERY WITH OSTEOTOMY OF BONE FRAGEMENT;  Surgeon: Angelia Mould, MD;  Location: [facility];  Service: Vascular;  Laterality: Left;  . FEMUR IM NAIL Left 06/11/2017   Procedure: INTRAMEDULLARY (IM) NAIL FEMORAL;  Surgeon: Rod Can, MD;  Location: [facility];  Service: Orthopedics;  Laterality: Left;  . LEG SURGERY Left   . NASAL SEPTUM SURGERY    . TONSILLECTOMY    . WRIST FRACTURE SURGERY      Family History  Problem Relation Age of Onset  . Heart disease  Mother   . Stroke Mother   . Emphysema Father   . COPD Sister   . Liver disease Brother   . Colon polyps Neg Hx   . Colon cancer Neg Hx     Social History   Socioeconomic History  . Marital status: Married    Spouse name: Jeneen Rinks  . Number of children: 3  . Years of education: 12+  . Highest education level: Some college, no degree  Occupational History  . Not on file  Tobacco Use  . Smoking status: Never Smoker  . Smokeless tobacco: Never Used  Vaping Use  . Vaping Use: Never used  Substance and Sexual Activity  . Alcohol use: No    Alcohol/week: 0.0 standard drinks  . Drug use: No  . Sexual activity: Not Currently    Birth control/protection: Surgical    Comment: hysterectomy  Other  Topics Concern  . Not on file  Social History Narrative   Married for 21 years in second marriage.First marriage lasted 19 years.Lives with husband.   Social Determinants of Health   Financial Resource Strain:   . Difficulty of Paying Living Expenses:   Food Insecurity:   . Worried About Charity fundraiser in the Last Year:   . Arboriculturist in the Last Year:   Transportation Needs:   . Film/video editor (Medical):   Marland Kitchen Lack of Transportation (Non-Medical):   Physical Activity:   . Days of Exercise per Week:   . Minutes of Exercise per Session:   Stress:   . Feeling of Stress :   Social Connections:   . Frequency of Communication with Friends and Family:   . Frequency of Social Gatherings with Friends and Family:   . Attends Religious Services:   . Active Member of Clubs or Organizations:   . Attends Archivist Meetings:   Marland Kitchen Marital Status:   Intimate Partner Violence:   . Fear of Current or Ex-Partner:   . Emotionally Abused:   Marland Kitchen Physically Abused:   . Sexually Abused:     ROS:  General: Negative for anorexia, weight loss, fever, chills, fatigue, weakness. Eyes: Negative for vision changes.  ENT: Negative for hoarseness, difficulty swallowing , nasal  congestion. CV: Negative for chest pain, angina, palpitations, dyspnea on exertion, peripheral edema.  Respiratory: Negative for dyspnea at rest, dyspnea on exertion, cough, sputum, wheezing.  GI: See history of present illness. GU:  Negative for dysuria, hematuria, urinary incontinence, urinary frequency, nocturnal urination.  MS: Negative for joint pain, low back pain.  Derm: Negative for rash or itching.  Neuro: Negative for weakness, abnormal sensation, seizure, frequent headaches, +memory loss since TIAs, confusion.  Psych: Negative for anxiety, depression, suicidal ideation, hallucinations.  Endo: Negative for unusual weight change.  Heme: Negative for bruising or bleeding. Allergy: Negative for rash or hives.   Observations/Objective:  Pleasant, conversant. Alert and oriented. NAD. Otherwise could not evaluate.    Lab Results  Component Value Date   CREATININE 0.84 01/19/2020   BUN 17 01/19/2020   NA 140 01/19/2020   K 4.6 01/19/2020   CL 104 01/19/2020   CO2 24 01/19/2020   Lab Results  Component Value Date   ALT 18 01/19/2020   AST 23 01/19/2020   ALKPHOS 87 09/19/2017   BILITOT 0.7 01/19/2020   Lab Results  Component Value Date   WBC 5.7 12/03/2019   HGB 12.2 12/03/2019   HCT 38.9 12/03/2019   MCV 99.2 12/03/2019   PLT 197 12/03/2019   Lab Results  Component Value Date   VITAMINB12 2,645 (H) 12/02/2019   Lab Results  Component Value Date   TSH 0.655 12/02/2019    Assessment and Plan: 86 y/o female with dysphagia, constipation, anemia. Anemia has resolved. Manages with chronic dysphagia/oropharyngeal dysphagia with taking medication in applesauce and pureeing food. Denies any weight loss, monitoring it at home. Constipation well managed with fiber and miralax daily. Will plan to continue current medications, Nexium and Miralax/psyllium. Continue to monitor weight and let us know if total of 5 pound weight loss. Office visit in six months or call sooner if  needed.   Follow Up Instructions:    I discussed the assessment and treatment plan with the patient. The patient was provided an opportunity to ask questions and all were answered. The patient agreed with the plan and demonstrated an understanding  of the instructions. AVS mailed to patient's home address.   The patient was advised to call back or seek an in-person evaluation if the symptoms worsen or if the condition fails to improve as anticipated.  I provided 23 minutes of non-face-to-face time during this encounter.   Neil Crouch, PA-C

## 2020-04-04 NOTE — Patient Instructions (Addendum)
1. Continue Nexium 20mg  daily before breakfast daily.  2. Continue miralax one capful daily for constipation. 3. Continue psyllium fiber daily for constipation.  4. Continue to monitor your weight weekly. If you see weight drop more than five pounds total then please call us.  5. Office visit in six months or call sooner if needed.

## 2020-04-04 NOTE — Progress Notes (Signed)
Cc'ed to pcp °

## 2020-04-05 ENCOUNTER — Telehealth: Payer: Self-pay | Admitting: *Deleted

## 2020-04-05 NOTE — Telephone Encounter (Signed)
Sara Garcia, you are scheduled for a virtual visit with your provider today.  Just as we do with appointments in the office, we must obtain your consent to participate.  Your consent will be active for this visit and any virtual visit you may have with one of our providers in the next 365 days.  If you have a MyChart account, I can also send a copy of this consent to you electronically.  All virtual visits are billed to your insurance company just like a traditional visit in the office.  As this is a virtual visit, video technology does not allow for your provider to perform a traditional examination.  This may limit your provider's ability to fully assess your condition.  If your provider identifies any concerns that need to be evaluated in person or the need to arrange testing such as labs, EKG, etc, we will make arrangements to do so.  Although advances in technology are sophisticated, we cannot ensure that it will always work on either your end or our end.  If the connection with a video visit is poor, we may have to switch to a telephone visit.  With either a video or telephone visit, we are not always able to ensure that we have a secure connection.   I need to obtain your verbal consent now.   Are you willing to proceed with your visit today?

## 2020-04-05 NOTE — Telephone Encounter (Signed)
Pt consented to a telephone visit on 04/04/20.

## 2020-04-11 ENCOUNTER — Ambulatory Visit: Payer: Medicare HMO | Admitting: Orthopaedic Surgery

## 2020-04-11 ENCOUNTER — Ambulatory Visit (INDEPENDENT_AMBULATORY_CARE_PROVIDER_SITE_OTHER): Payer: Medicare HMO | Admitting: Orthopaedic Surgery

## 2020-04-11 ENCOUNTER — Telehealth: Payer: Self-pay

## 2020-04-11 ENCOUNTER — Encounter: Payer: Self-pay | Admitting: Orthopaedic Surgery

## 2020-04-11 ENCOUNTER — Other Ambulatory Visit (INDEPENDENT_AMBULATORY_CARE_PROVIDER_SITE_OTHER): Payer: Self-pay

## 2020-04-11 ENCOUNTER — Other Ambulatory Visit: Payer: Self-pay

## 2020-04-11 DIAGNOSIS — M25511 Pain in right shoulder: Secondary | ICD-10-CM

## 2020-04-11 DIAGNOSIS — G8929 Other chronic pain: Secondary | ICD-10-CM | POA: Diagnosis not present

## 2020-04-11 DIAGNOSIS — E876 Hypokalemia: Secondary | ICD-10-CM

## 2020-04-11 MED ORDER — POTASSIUM CHLORIDE ER 20 MEQ PO TBCR: 20.0000 meq | EXTENDED_RELEASE_TABLET | Freq: Two times a day (BID) | ORAL | 1 refills | Status: DC

## 2020-04-11 NOTE — Telephone Encounter (Signed)
Back on 03/07/20 Sara Garcia called patient and advised her that we have no options for HHPT for her with her current insurance. Patient was aware that she cannot do OPPT because  [facility] does not cover it.

## 2020-04-11 NOTE — Progress Notes (Signed)
Virtual Visit via Telephone Note  I connected with@ on 04/11/20 at 10:20 AM EDT by telephone and verified that I am speaking with the correct person using two identifiers.  Location: Patient: home Provider: [place]   I discussed the limitations, risks, security and privacy concerns of performing an evaluation and management service by telephone and the availability of in person appointments. I also discussed with the patient that there may be a patient responsible charge related to this service. The patient expressed understanding and agreed to proceed.   History of Present Illness: Patient has chronic right shoulder pain.  Nothing seems to help.  She is limited to being at home and not being able to travel to office.  She cannot take NSAIDs and has had reaction to prednisone.  I will try to get home health to see her.  We had problem in getting this done in the past.  I really have nothing else to offer.  She cannot come to office for injection.   Observations/Objective: Per above.  Assessment and Plan: Encounter Diagnosis  Name Primary?  . Chronic right shoulder pain Yes     Follow Up Instructions: Try to get home health to see her.   I discussed the assessment and treatment plan with the patient. The patient was provided an opportunity to ask questions and all were answered. The patient agreed with the plan and demonstrated an understanding of the instructions.   The patient was advised to call back or seek an in-person evaluation if the symptoms worsen or if the condition fails to improve as anticipated.  I provided 6 minutes of non-face-to-face time during this encounter.   Sanjuana Kava, MD

## 2020-04-13 MED ORDER — POTASSIUM CHLORIDE CRYS ER 20 MEQ PO TBCR: 20.0000 meq | EXTENDED_RELEASE_TABLET | Freq: Two times a day (BID) | ORAL | 0 refills | Status: DC

## 2020-04-13 NOTE — Telephone Encounter (Signed)
I have sent a new prescription of potassium tablets to the patient's pharmacy at [facility].  These tablets can be melted in approximately 1 tablespoon of water.  Takes about 1 minute for the tablet to dissolve, and then she can try taking the dissolved tablet with applesauce or Jell-O or something more palatable for her.  Let me know if have any other questions.

## 2020-04-24 ENCOUNTER — Ambulatory Visit (INDEPENDENT_AMBULATORY_CARE_PROVIDER_SITE_OTHER): Payer: Medicare HMO | Admitting: Internal Medicine

## 2020-04-24 ENCOUNTER — Telehealth: Payer: Self-pay | Admitting: Cardiology

## 2020-04-24 MED ORDER — RIVAROXABAN 15 MG PO TABS: 15.0000 mg | ORAL_TABLET | Freq: Every day | ORAL | 0 refills | Status: DC

## 2020-04-24 NOTE — Telephone Encounter (Signed)
Requesting samples of Xarelto 

## 2020-04-24 NOTE — Telephone Encounter (Signed)
Ricky notified - samples ready for pick up.   

## 2020-05-22 ENCOUNTER — Telehealth: Payer: Self-pay | Admitting: Cardiology

## 2020-05-22 ENCOUNTER — Other Ambulatory Visit (INDEPENDENT_AMBULATORY_CARE_PROVIDER_SITE_OTHER): Payer: Self-pay | Admitting: Internal Medicine

## 2020-05-22 NOTE — Telephone Encounter (Signed)
Pt son Audry Pili made aware that [facility] office had Xarelto 15 mg and staff made aware

## 2020-05-22 NOTE — Telephone Encounter (Signed)
  Patient calling the office for samples of medication:   1. What medication and dosage are you requesting samples for?   XARELTO 15 MG 2. Are you currentlyout of this medication?

## 2020-06-14 ENCOUNTER — Telehealth: Payer: Self-pay | Admitting: Cardiology

## 2020-06-14 MED ORDER — RIVAROXABAN 15 MG PO TABS: 15.0000 mg | ORAL_TABLET | Freq: Every day | ORAL | 0 refills | Status: DC

## 2020-06-14 MED ORDER — RIVAROXABAN 15 MG PO TABS
15.0000 mg | ORAL_TABLET | Freq: Every day | ORAL | 0 refills | Status: DC
Start: 2020-06-14 — End: 2020-07-10

## 2020-06-14 NOTE — Addendum Note (Signed)
Addended by: Merlene Laughter on: 06/14/2020 11:52 AM   Modules accepted: Orders

## 2020-06-14 NOTE — Telephone Encounter (Signed)
Requesting samples of Rivaroxaban (XARELTO) 15 MG TABS tablet [[id number]]   Please call son Audry Pili

## 2020-06-14 NOTE — Telephone Encounter (Signed)
Advised that no samples available at [facility] office but 3 bottles available at [facility] office. Advised that patient assistance application also available for pick up at [facility] office, to fill out all patient sections and bring back to office with proof of income for total household and prescription out of pocket expense for 2021. Verbalized understanding.

## 2020-06-14 NOTE — Telephone Encounter (Signed)
Cancel previous lot # and exp for xarelto 15 mg.

## 2020-07-03 ENCOUNTER — Other Ambulatory Visit (INDEPENDENT_AMBULATORY_CARE_PROVIDER_SITE_OTHER): Payer: Self-pay | Admitting: Internal Medicine

## 2020-07-04 ENCOUNTER — Encounter (INDEPENDENT_AMBULATORY_CARE_PROVIDER_SITE_OTHER): Payer: Self-pay | Admitting: Internal Medicine

## 2020-07-04 ENCOUNTER — Other Ambulatory Visit: Payer: Self-pay | Admitting: Nurse Practitioner

## 2020-07-04 ENCOUNTER — Other Ambulatory Visit: Payer: Self-pay

## 2020-07-04 ENCOUNTER — Ambulatory Visit (INDEPENDENT_AMBULATORY_CARE_PROVIDER_SITE_OTHER): Payer: Medicare HMO | Admitting: Internal Medicine

## 2020-07-04 VITALS — BP 130/70 | HR 91 | Temp 97.3°F | Ht 63.0 in | Wt 150.2 lb

## 2020-07-04 DIAGNOSIS — E039 Hypothyroidism, unspecified: Secondary | ICD-10-CM | POA: Diagnosis not present

## 2020-07-04 DIAGNOSIS — M25511 Pain in right shoulder: Secondary | ICD-10-CM | POA: Diagnosis not present

## 2020-07-04 DIAGNOSIS — M791 Myalgia, unspecified site: Secondary | ICD-10-CM

## 2020-07-04 DIAGNOSIS — N390 Urinary tract infection, site not specified: Secondary | ICD-10-CM | POA: Diagnosis not present

## 2020-07-04 DIAGNOSIS — G8929 Other chronic pain: Secondary | ICD-10-CM

## 2020-07-04 DIAGNOSIS — R531 Weakness: Secondary | ICD-10-CM

## 2020-07-04 NOTE — Progress Notes (Signed)
Metrics: Intervention Frequency ACO  Documented Smoking Status Yearly  Screened one or more times in 24 months  Cessation Counseling or  Active cessation medication Past 24 months  Past 24 months   Guideline developer: UpToDate (See UpToDate for funding source) Date Released: 2014       Wellness Office Visit  Subjective:  Patient ID: Sara Garcia, female    DOB: 1928/09/07  Age: 86 y.o. MRN: [id number]  CC: Weakness. HPI  This lady comes in for an acute visit because yesterday morning for several hours she became very weak and was not able to get out of her bed.  She did not apparently appear to be confused although there was one episode of slurred speech but this resolved extremely quickly. She has been feeling fine since yesterday morning and into today. She complains of myalgia/body aches all over but without fever.  This is a chronic issue not acute. Her son wonders if she has early evidence of urine infection which is what occurs previously when she feels like this. Past Medical History:  Diagnosis Date  . 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 Arthritis   . Asthma   . DVT (deep venous thrombosis) ([place]) 2011/2012  . Esophageal dysphagia    limited food and pill  . Esophageal reflux   . Essential hypertension, benign   . GERD (gastroesophageal reflux disease)   . Osteoporosis   . Overactive bladder   . Paroxysmal atrial fibrillation ([facility])    a. on Xarelto for anticoagulation  . 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 Stroke ([place])   . Unspecified prolapse of vaginal walls    Past Surgical History:  Procedure Laterality Date  . ABDOMINAL HYSTERECTOMY    . COLONOSCOPY W/ BIOPSIES  02/01/2004   RMR: Anal papilla with normal rectum/Sigmoid diverticula/Small polyps in the cecum removed as described above. Inflammatory polyp  . ESOPHAGEAL DILATION     unknown year per patient.  . ESOPHAGEAL DILATION N/A 02/07/2015   RMR: Normal esophagus status post maloney dilation. Small hiatal hernia.   Marland Kitchen  ESOPHAGOGASTRODUODENOSCOPY     unknown year per patient  . ESOPHAGOGASTRODUODENOSCOPY N/A 02/07/2015   RMR: Normal esophagus  status post  Maloney dilation. Small hiatal hernia  . FALSE ANEURYSM REPAIR Left 09/23/2017   Procedure: REPAIR FALSE ANEURYSM COMMON LEFT FEMORAL ARTERY WITH OSTEOTOMY OF BONE FRAGEMENT;  Surgeon: Angelia Mould, MD;  Location: [facility];  Service: Vascular;  Laterality: Left;  . FEMUR IM NAIL Left 06/11/2017   Procedure: INTRAMEDULLARY (IM) NAIL FEMORAL;  Surgeon: Rod Can, MD;  Location: [facility];  Service: Orthopedics;  Laterality: Left;  . LEG SURGERY Left   . NASAL SEPTUM SURGERY    . TONSILLECTOMY    . WRIST FRACTURE SURGERY       Family History  Problem Relation Age of Onset  . Heart disease Mother   . Stroke Mother   . Emphysema Father   . COPD Sister   . Liver disease Brother   . Colon polyps Neg Hx   . Colon cancer Neg Hx     Social History   Social History Narrative   Married for 21 years in second marriage.First marriage lasted 67 years.Lives with husband.   Social History   Tobacco Use  . Smoking status: Never Smoker  . Smokeless tobacco: Never Used  Substance Use Topics  . Alcohol use: No    Alcohol/week: 0.0 standard drinks    Current Meds  Medication Sig  . acetaminophen (  TYLENOL) 650 MG CR tablet Take 650 mg by mouth as needed for pain.   . cholecalciferol (VITAMIN D) 1000 units tablet Take 1,000 Units by mouth 3 (three) times daily.  Marland Kitchen diltiazem (CARDIZEM SR) 60 MG 12 hr capsule Take 1 capsule (60 mg total) by mouth in the morning, at noon, and at bedtime.  . DULoxetine (CYMBALTA) 60 MG capsule TAKE 1 CAPSULE (60 MG TOTAL) BY MOUTH 2 (TWO) TIMES DAILY.  Marland Kitchen esomeprazole (NEXIUM) 20 MG capsule TAKE 1 CAPSULE BY MOUTH ONCE DAILY BEFORE BREAKFAST  . Fluocinolone Acetonide 0.01 % SHAM Apply 2 tablespoons of shampoo to the scalp daily as needed for up to 2 weeks; leave shampoo on scalp for 5 minutes then rinse.  Marland Kitchen  Fluticasone-Salmeterol (ADVAIR) 250-50 MCG/DOSE AEPB Inhale 1 puff into the lungs daily.  . furosemide (LASIX) 20 MG tablet Take 1 tablet (20 mg total) by mouth 2 (two) times daily.  Marland Kitchen KLOR-CON M20 20 MEQ tablet TAKE 1 TABLET BY MOUTH TWICE A DAY  . Lidocaine 1.8 % PTCH Apply 1 patch topically.  . NP THYROID 60 MG tablet TAKE 1 TABLET BY MOUTH EVERY DAY  . ondansetron (ZOFRAN-ODT) 4 MG disintegrating tablet Take 1 tablet (4 mg total) by mouth as needed.  . polyethylene glycol powder (GLYCOLAX/MIRALAX) 17 GM/SCOOP powder Take 17 g by mouth daily.  . Rivaroxaban (XARELTO) 15 MG TABS tablet Take 1 tablet (15 mg total) by mouth daily with supper.  . vitamin B-12 (CYANOCOBALAMIN) 1000 MCG tablet Take 1 tablet (1,000 mcg total) by mouth daily.      Depression screen [facility] 2/9 01/19/2020 10/21/2019 09/07/2019  Decreased Interest 0 0 3  Down, Depressed, Hopeless 0 0 3  PHQ - 2 Score 0 0 6  Altered sleeping - - 2  Tired, decreased energy - - 3  Change in appetite - - 1  Feeling bad or failure about yourself  - - 0  Trouble concentrating - - 1  Moving slowly or fidgety/restless - - 0  Suicidal thoughts - - 0  PHQ-9 Score - - 13  Some recent data might be hidden     Objective:   Today's Vitals: BP 130/70 (BP Location: Left Arm, Patient Position: Sitting, Cuff Size: Normal)   Pulse 91   Temp (!) 97.3 F (36.3 C) (Temporal)   Ht 5\' 3"  (1.6 m)   Wt 150 lb 3.2 oz (68.1 kg)   SpO2 92%   BMI 26.61 kg/m  Vitals with BMI 07/04/2020 03/23/2020 03/14/2020  Height 5\' 3"  5\' 2"  5\' 2"   Weight 150 lbs 3 oz 141 lbs 3 oz 138 lbs  BMI 26.61 98.92 11.94  Systolic 174 081 -  Diastolic 70 80 -  Pulse 91 61 -     Physical Exam  She appears to be alert and orientated today.  She is afebrile.    Assessment   1. Weakness   2. Urinary tract infection without hematuria, site unspecified   3. Hypothyroidism, adult   4. Chronic right shoulder pain   5. Myalgia       Tests ordered Orders Placed This  Encounter  Procedures  . CBC  . COMPLETE METABOLIC PANEL WITH GFR  . Urinalysis w microscopic + reflex cultur  . Sedimentation rate     Plan: 1. We will check some blood work and also check a urinalysis and possible culture to see if there are any explanations for her weakness. 2. She also talked about her chronic right shoulder pain  and I have suggested she go back and see the orthopedic doctor. 3. We discussed the use of NP thyroid and she will discontinue it for right now to see if she feels worse.  I have told her that I will no longer send prescriptions to local pharmacies for NP thyroid at the present time. 4. Follow-up as scheduled.   No orders of the defined types were placed in this encounter.   Doree Albee, MD

## 2020-07-06 LAB — URINALYSIS W MICROSCOPIC + REFLEX CULTURE
Bacteria, UA: NONE SEEN /HPF
Bilirubin Urine: NEGATIVE
Glucose, UA: NEGATIVE
Hgb urine dipstick: NEGATIVE
Hyaline Cast: NONE SEEN /LPF
Ketones, ur: NEGATIVE
Nitrites, Initial: NEGATIVE
Protein, ur: NEGATIVE
RBC / HPF: NONE SEEN /HPF (ref 0–2)
Specific Gravity, Urine: 1.011 (ref 1.001–1.03)
WBC, UA: >=60 /HPF — AB (ref 0–5)
pH: <=5 (ref 5.0–8.0)

## 2020-07-06 LAB — SEDIMENTATION RATE: Sed Rate: 17 mm/h (ref 0–30)

## 2020-07-06 LAB — CBC
HCT: 40.6 % (ref 35.0–45.0)
Hemoglobin: 13 g/dL (ref 11.7–15.5)
MCH: 27.5 pg (ref 27.0–33.0)
MCHC: 32 g/dL (ref 32.0–36.0)
MCV: 86 fL (ref 80.0–100.0)
MPV: 10.4 fL (ref 7.5–12.5)
Platelets: 301 10*3/uL (ref 140–400)
RBC: 4.72 10*6/uL (ref 3.80–5.10)
RDW: 12.5 % (ref 11.0–15.0)
WBC: 7.9 10*3/uL (ref 3.8–10.8)

## 2020-07-06 LAB — COMPLETE METABOLIC PANEL WITH GFR
AG Ratio: 1.2 (calc) (ref 1.0–2.5)
ALT: 16 U/L (ref 6–29)
AST: 20 U/L (ref 10–35)
Albumin: 3.8 g/dL (ref 3.6–5.1)
Alkaline phosphatase (APISO): 92 U/L (ref 37–153)
BUN: 18 mg/dL (ref 7–25)
CO2: 28 mmol/L (ref 20–32)
Calcium: 9.5 mg/dL (ref 8.6–10.4)
Chloride: 106 mmol/L (ref 98–110)
Creat: 0.83 mg/dL (ref 0.60–0.88)
GFR, Est African American: 72 mL/min/{1.73_m2} (ref >=60)
GFR, Est Non African American: 62 mL/min/{1.73_m2} (ref >=60)
Globulin: 3.2 g/dL (calc) (ref 1.9–3.7)
Glucose, Bld: 105 mg/dL — ABNORMAL HIGH (ref 65–99)
Potassium: 4.9 mmol/L (ref 3.5–5.3)
Sodium: 143 mmol/L (ref 135–146)
Total Bilirubin: 1 mg/dL (ref 0.2–1.2)
Total Protein: 7 g/dL (ref 6.1–8.1)

## 2020-07-06 LAB — URINE CULTURE

## 2020-07-06 LAB — CULTURE INDICATED

## 2020-07-06 NOTE — Progress Notes (Signed)
Call this patient and tell her that her blood work is okay.  Her urine does not show any evidence of infection.Follow-up as scheduled.

## 2020-07-10 ENCOUNTER — Telehealth: Payer: Self-pay | Admitting: Cardiology

## 2020-07-10 MED ORDER — RIVAROXABAN 15 MG PO TABS
15.0000 mg | ORAL_TABLET | Freq: Every day | ORAL | 0 refills | Status: DC
Start: 2020-07-10 — End: 2020-09-18

## 2020-07-10 NOTE — Telephone Encounter (Signed)
Patient calling the office for samples of medication:   1.  What medication and dosage are you requesting samples for?   XARELTO   2.  Are you currently out of this medication? NO

## 2020-07-10 NOTE — Progress Notes (Signed)
  CALLED SON, NOTIFIED OF LAB RESULTS. MESSAGE WAS LEFT ON VOICE MAIL.

## 2020-07-10 NOTE — Telephone Encounter (Signed)
Advised that patient assistance paperwork needed to be turned in ASAP. Advised that samples aren't always available. Verbalized understanding. Xarelto samples provided.

## 2020-07-24 ENCOUNTER — Encounter (INDEPENDENT_AMBULATORY_CARE_PROVIDER_SITE_OTHER): Payer: Self-pay | Admitting: Nurse Practitioner

## 2020-07-24 ENCOUNTER — Other Ambulatory Visit: Payer: Self-pay

## 2020-07-24 ENCOUNTER — Telehealth (INDEPENDENT_AMBULATORY_CARE_PROVIDER_SITE_OTHER): Payer: Medicare HMO | Admitting: Nurse Practitioner

## 2020-07-24 DIAGNOSIS — M79605 Pain in left leg: Secondary | ICD-10-CM

## 2020-07-24 MED ORDER — DILTIAZEM HCL ER 60 MG PO CP12: 60.0000 mg | ORAL_CAPSULE | Freq: Two times a day (BID) | ORAL | 0 refills | Status: DC

## 2020-07-24 NOTE — Progress Notes (Signed)
Due to national recommendations of social distancing related to the [place] pandemic, an audio-only tele-health visit was felt to be the most appropriate encounter type for this patient today. I connected with  Sara Garcia on 07/24/20 utilizing audio-only technology and verified that I am speaking with the correct person using two identifiers. The patient was located at their home, and I was located at the office of [facility] during the encounter. I discussed the limitations of evaluation and management by telemedicine. The patient expressed understanding and agreed to proceed.      Subjective:  Patient ID: Sara Garcia, female    DOB: 03/02/1928  Age: 86 y.o. MRN: [id number]  CC:  Chief Complaint  Patient presents with  . Other    left leg pain      HPI  This patient arrives today for virtual visit for the above.  She tells me she is been experiencing "nerve damage" and pain to her left leg.  She tells me this initially started back in 2018 after she had surgery on her femur.  Per chart review it appears that in August 2018 she experienced a fall and had fractured her left femur.  She did undergo left hip repair at that time.  Then in December 2018 she did require repair of left femoral artery pseudoaneurysm and osteotomy of fragment of femur which had penetrated her artery.  She tells me the pain is uncomfortable, but is not able to characterize it much more than that.  She tells me that on her left thigh the top part feels uncomfortable in the bottom part is a numbness feeling.  She tells me that the pain has progressively gotten worse but she attributes this to the weather.  She tells me this is not a new worsening factor.  She does take Cymbalta 60 mg twice a day as well as Tylenol as needed.  But the pain is still quite bothersome to her.  She is fairly weak and is mostly wheelchair-bound, but can stand and take a few steps.  She would like to be able to walk more but is unable  to due to the pain.  Past Medical History:  Diagnosis Date  . 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 Arthritis   . Asthma   . DVT (deep venous thrombosis) ([place]) 2011/2012  . Esophageal dysphagia    limited food and pill  . Esophageal reflux   . Essential hypertension, benign   . GERD (gastroesophageal reflux disease)   . Osteoporosis   . Overactive bladder   . Paroxysmal atrial fibrillation (HCC)    a. on Xarelto for anticoagulation  . 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 Stroke ([place])   . Unspecified prolapse of vaginal walls       Family History  Problem Relation Age of Onset  . Heart disease Mother   . Stroke Mother   . Emphysema Father   . COPD Sister   . Liver disease Brother   . Colon polyps Neg Hx   . Colon cancer Neg Hx     Social History   Social History Narrative   Married for 21 years in second marriage.First marriage lasted 6 years.Lives with husband.   Social History   Tobacco Use  . Smoking status: Never Smoker  . Smokeless tobacco: Never Used  Substance Use Topics  . Alcohol use: No    Alcohol/week: 0.0 standard drinks     Current Meds  Medication Sig  .  acetaminophen (TYLENOL) 650 MG CR tablet Take 650 mg by mouth as needed for pain.   . cholecalciferol (VITAMIN D) 1000 units tablet Take 1,000 Units by mouth 3 (three) times daily.  Marland Kitchen diltiazem (CARDIZEM SR) 60 MG 12 hr capsule Take 1 capsule (60 mg total) by mouth in the morning, at noon, and at bedtime. (Patient taking differently: Take 60 mg by mouth 2 (two) times daily. )  . DULoxetine (CYMBALTA) 60 MG capsule TAKE 1 CAPSULE (60 MG TOTAL) BY MOUTH 2 (TWO) TIMES DAILY.  Marland Kitchen esomeprazole (NEXIUM) 20 MG capsule TAKE 1 CAPSULE BY MOUTH ONCE DAILY BEFORE BREAKFAST  . Fluocinolone Acetonide 0.01 % SHAM Apply 2 tablespoons of shampoo to the scalp daily as needed for up to 2 weeks; leave shampoo on scalp for 5 minutes then rinse.  Marland Kitchen Fluticasone-Salmeterol (ADVAIR) 250-50 MCG/DOSE AEPB Inhale 1 puff into the lungs  daily.  . furosemide (LASIX) 20 MG tablet Take 1 tablet (20 mg total) by mouth 2 (two) times daily.  Marland Kitchen KLOR-CON M20 20 MEQ tablet TAKE 1 TABLET BY MOUTH TWICE A DAY  . Lidocaine 1.8 % PTCH Apply 1 patch topically.  . NP THYROID 60 MG tablet TAKE 1 TABLET BY MOUTH EVERY DAY  . polyethylene glycol powder (GLYCOLAX/MIRALAX) 17 GM/SCOOP powder Take 17 g by mouth daily.  . Rivaroxaban (XARELTO) 15 MG TABS tablet Take 1 tablet (15 mg total) by mouth daily with supper.  . vitamin B-12 (CYANOCOBALAMIN) 1000 MCG tablet Take 1 tablet (1,000 mcg total) by mouth daily.    ROS:  See HPI   Objective:   Today's Vitals: There were no vitals taken for this visit. Vitals with BMI 07/24/2020 07/04/2020 03/23/2020  Height - 5\' 3"  5\' 2"   Weight - 150 lbs 3 oz 141 lbs 3 oz  BMI - 31.59 45.85  Systolic (No Data) 929 244  Diastolic (No Data) 70 80  Pulse - 91 61     Physical Exam Comprehensive physical exam not conducted today as office visit was conducted over the phone.  She did sound well over the phone she was alert and oriented.  She appeared to have appropriate thought processes and judgment.      Assessment and Plan   1. Left leg pain      Plan: 1.  Most likely etiology is nerve damage secondary to femur fracture and subsequent surgeries.  However, I would like to see her in the office to perform neurologic exam to determine if imaging of the spine should be considered as well.  For now she will continue taking her Tylenol and Cymbalta.  And I have scheduled her to follow-up with me later this week for physical exam to determine next steps.  She tells me she understands.   Tests ordered No orders of the defined types were placed in this encounter.     No orders of the defined types were placed in this encounter.   Patient to follow-up in 2 days.  Total telephone conversation lasted for 25 minutes and 24 seconds.  Ailene Ards, NP

## 2020-07-25 ENCOUNTER — Telehealth (INDEPENDENT_AMBULATORY_CARE_PROVIDER_SITE_OTHER): Payer: Self-pay | Admitting: Nurse Practitioner

## 2020-07-25 NOTE — Telephone Encounter (Signed)
Called patient and gave her the message from Judson Roch. Canceled her appointment on 07/27/2020 since she is feeling better and not able to get a ride to our office. Patient will have someone call to schedule her nurse visit for her flu vaccine. Patient verbalized an understanding.

## 2020-07-25 NOTE — Telephone Encounter (Signed)
Called patient and gave her the message and she stated that she is feeling better this morning.   Patient did ask if she needs to continue using the medication for her head and ears that helps itching?   Also did you want her to use Senokot?   And patient also stated that her son Sara Garcia reminded her to do her therapy exercises and she did them and her leg does feel some better.  Please advise.

## 2020-07-25 NOTE — Telephone Encounter (Signed)
She can use the shampoo for itching of her scalp as needed. She may also use over-the-counter Senokot if she continues to have constipation despite using her MiraLAX. I would recommend she take 1 tablet of over-the-counter Senokot daily as needed for constipation. If needed physical therapy exercises are helping her symptoms she can continue to do this, but if her symptoms progress or worsen especially if she has neurological symptoms as stated before she should go to the emergency department. Thank you.

## 2020-07-25 NOTE — Telephone Encounter (Signed)
Sara Garcia, I received a voicemail from this patient this morning stating she is not able to make it to her in-person appointment scheduled for later this week.  She is calling in reference to her virtual telephone visit that was conducted yesterday.  At that visit she had complaints of leg pain and numbness, recommendation was that she come to the office to be examined.  Because she needs to be examined in person, please let her know that I am unable to prescribe any medication for her pain and itching without performing a physical exam, this is why I made this recommendation after speaking with her on the phone yesterday.  If she is unable to come to the office, we can reschedule the appointment for a later date.  While awaiting her appointment please make her aware that if she experiences severe pain or neurologic deficits such as new weakness, new sensation changes or numbness, new urinary or bowel incontinence, she needs to call 911.

## 2020-07-27 ENCOUNTER — Ambulatory Visit (INDEPENDENT_AMBULATORY_CARE_PROVIDER_SITE_OTHER): Payer: Medicare HMO | Admitting: Nurse Practitioner

## 2020-08-10 ENCOUNTER — Other Ambulatory Visit (INDEPENDENT_AMBULATORY_CARE_PROVIDER_SITE_OTHER): Payer: Self-pay | Admitting: Internal Medicine

## 2020-08-19 ENCOUNTER — Other Ambulatory Visit (INDEPENDENT_AMBULATORY_CARE_PROVIDER_SITE_OTHER): Payer: Self-pay | Admitting: Internal Medicine

## 2020-09-06 ENCOUNTER — Encounter: Payer: Self-pay | Admitting: Internal Medicine

## 2020-09-18 ENCOUNTER — Telehealth: Payer: Self-pay | Admitting: Cardiology

## 2020-09-18 MED ORDER — RIVAROXABAN 15 MG PO TABS
15.0000 mg | ORAL_TABLET | Freq: Every day | ORAL | 0 refills | Status: DC
Start: 2020-09-18 — End: 2020-10-23

## 2020-09-18 NOTE — Telephone Encounter (Signed)
Pt son will pick up Xarelto samples today

## 2020-09-18 NOTE — Telephone Encounter (Signed)
Requesting samples of Rivaroxaban (XARELTO) 15 MG TABS tablet [[id number]]    Please call [phone]

## 2020-09-21 ENCOUNTER — Ambulatory Visit (INDEPENDENT_AMBULATORY_CARE_PROVIDER_SITE_OTHER): Payer: Medicare HMO | Admitting: Nurse Practitioner

## 2020-09-21 ENCOUNTER — Encounter (INDEPENDENT_AMBULATORY_CARE_PROVIDER_SITE_OTHER): Payer: Self-pay | Admitting: Nurse Practitioner

## 2020-09-21 ENCOUNTER — Other Ambulatory Visit: Payer: Self-pay

## 2020-09-21 ENCOUNTER — Other Ambulatory Visit (INDEPENDENT_AMBULATORY_CARE_PROVIDER_SITE_OTHER): Payer: Self-pay | Admitting: Nurse Practitioner

## 2020-09-21 VITALS — BP 128/74 | HR 67 | Temp 97.0°F | Ht 62.0 in | Wt 156.0 lb

## 2020-09-21 DIAGNOSIS — Z79899 Other long term (current) drug therapy: Secondary | ICD-10-CM

## 2020-09-21 DIAGNOSIS — Z0000 Encounter for general adult medical examination without abnormal findings: Secondary | ICD-10-CM

## 2020-09-21 DIAGNOSIS — Z23 Encounter for immunization: Secondary | ICD-10-CM

## 2020-09-21 NOTE — Patient Instructions (Signed)
Covid Testing: [phone] Covid Clinic: [phone]    Sara Garcia , Thank you for taking time to come for your Medicare Wellness Visit. I appreciate your ongoing commitment to your health goals. Please review the following plan we discussed and let me know if I can assist you in the future.   These are the goals we discussed: Goals    . DIET - EAT MORE FRUITS AND VEGETABLES       This is a list of the screening recommended for you and due dates:  Health Maintenance  Topic Date Due  . Tetanus Vaccine  Never done  . Pneumonia vaccines (2 of 2 - PPSV23) 09/16/2019  . Flu Shot  05/14/2020  . COVID-19 Vaccine (3 - Booster for Moderna series) 08/10/2020  . DEXA scan (bone density measurement)  Completed

## 2020-09-21 NOTE — Progress Notes (Signed)
Subjective:   Sara Garcia is a 86 y.o. female who presents for Medicare Annual (Subsequent) preventive examination.   Cardiac Risk Factors include: advanced age (>35men, >43 women)     Objective:    Today's Vitals   09/21/20 1110  BP: 128/74  Pulse: 67  Temp: (!) 97 F (36.1 C)  TempSrc: Temporal  SpO2: 94%  Weight: 156 lb (70.8 kg)  Height: 5\' 2"  (1.575 m)   Body mass index is 28.53 kg/m.  Advanced Directives 09/21/2020 12/02/2019 09/07/2019 02/11/2018 02/11/2018 02/04/2018 10/17/2017  Does Patient Have a Medical Advance Directive? Yes Unable to assess, patient is non-responsive or altered mental status Yes Yes Yes Yes No  Type of Advance Directive Out of facility DNR (pink MOST or yellow form) - Out of facility DNR (pink MOST or yellow form) Out of facility DNR (pink MOST or yellow form) Out of facility DNR (pink MOST or yellow form) Out of facility DNR (pink MOST or yellow form) -  Does patient want to make changes to medical advance directive? No - Patient declined - No - Patient declined - - - -  Would patient like information on creating a medical advance directive? - - - - - - -  Pre-existing out of facility DNR order (yellow form or pink MOST form) - - - - - (No Data) -    Current Medications (verified) Outpatient Encounter Medications as of 09/21/2020  Medication Sig  . acetaminophen (TYLENOL) 650 MG CR tablet Take 650 mg by mouth as needed for pain.   . cholecalciferol (VITAMIN D) 1000 units tablet Take 1,000 Units by mouth 3 (three) times daily.  . [employer]S VITAMIN B12 1000 MCG tablet TAKE 1 TABLET BY MOUTH EVERY DAY  . diltiazem (CARDIZEM SR) 60 MG 12 hr capsule Take 1 capsule (60 mg total) by mouth 2 (two) times daily.  . DULoxetine (CYMBALTA) 60 MG capsule TAKE 1 CAPSULE (60 MG TOTAL) BY MOUTH 2 (TWO) TIMES DAILY.  Marland Kitchen esomeprazole (NEXIUM) 20 MG capsule TAKE 1 CAPSULE BY MOUTH ONCE DAILY BEFORE BREAKFAST  . fexofenadine (ALLEGRA) 60 MG tablet Take 60 mg by mouth daily.  .  Fluocinolone Acetonide 0.01 % SHAM Apply 2 tablespoons of shampoo to the scalp daily as needed for up to 2 weeks; leave shampoo on scalp for 5 minutes then rinse.  Marland Kitchen Fluticasone-Salmeterol (ADVAIR) 250-50 MCG/DOSE AEPB Inhale 1 puff into the lungs daily.  . furosemide (LASIX) 20 MG tablet Take 1 tablet (20 mg total) by mouth 2 (two) times daily.  Marland Kitchen KLOR-CON M20 20 MEQ tablet TAKE 1 TABLET BY MOUTH TWICE A DAY  . Lidocaine 1.8 % PTCH Apply 1 patch topically.  . NP THYROID 60 MG tablet TAKE 1 TABLET BY MOUTH EVERY DAY  . polyethylene glycol powder (GLYCOLAX/MIRALAX) 17 GM/SCOOP powder Take 17 g by mouth daily.  . Rivaroxaban (XARELTO) 15 MG TABS tablet Take 1 tablet (15 mg total) by mouth daily with supper.  . metoprolol tartrate (LOPRESSOR) 25 MG tablet Take 1 tablet (25 mg total) by mouth 2 (two) times daily.  . [DISCONTINUED] DULoxetine (CYMBALTA) 60 MG capsule Take 60 mg by mouth 2 (two) times daily.   . [DISCONTINUED] NP THYROID 60 MG tablet Take 60 mg by mouth daily before breakfast.   No facility-administered encounter medications on file as of 09/21/2020.    Allergies (verified) Latex, Adhesive [tape], Betadine [povidone iodine], Ciprofloxacin, Codeine, Penicillins, Sulfa antibiotics, Tramadol, Wellbutrin [bupropion], and Aloe   History: Past Medical History:  Diagnosis Date  . 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 Arthritis   . Asthma   . DVT (deep venous thrombosis) ([place]) 2011/2012  . Esophageal dysphagia    limited food and pill  . Esophageal reflux   . Essential hypertension, benign   . GERD (gastroesophageal reflux disease)   . Osteoporosis   . Overactive bladder   . Paroxysmal atrial fibrillation (HCC)    a. on Xarelto for anticoagulation  . 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 Stroke ([place])   . Unspecified prolapse of vaginal walls    Past Surgical History:  Procedure Laterality Date  . ABDOMINAL HYSTERECTOMY    . COLONOSCOPY W/ BIOPSIES  02/01/2004   RMR: Anal papilla with  normal rectum/Sigmoid diverticula/Small polyps in the cecum removed as described above. Inflammatory polyp  . ESOPHAGEAL DILATION     unknown year per patient.  . ESOPHAGEAL DILATION N/A 02/07/2015   RMR: Normal esophagus status post maloney dilation. Small hiatal hernia.   Marland Kitchen ESOPHAGOGASTRODUODENOSCOPY     unknown year per patient  . ESOPHAGOGASTRODUODENOSCOPY N/A 02/07/2015   RMR: Normal esophagus  status post  Maloney dilation. Small hiatal hernia  . FALSE ANEURYSM REPAIR Left 09/23/2017   Procedure: REPAIR FALSE ANEURYSM COMMON LEFT FEMORAL ARTERY WITH OSTEOTOMY OF BONE FRAGEMENT;  Surgeon: Angelia Mould, MD;  Location: [place];  Service: Vascular;  Laterality: Left;  . FEMUR IM NAIL Left 06/11/2017   Procedure: INTRAMEDULLARY (IM) NAIL FEMORAL;  Surgeon: Rod Can, MD;  Location: [facility];  Service: Orthopedics;  Laterality: Left;  . LEG SURGERY Left   . NASAL SEPTUM SURGERY    . TONSILLECTOMY    . WRIST FRACTURE SURGERY     Family History  Problem Relation Age of Onset  . Heart disease Mother   . Stroke Mother   . Emphysema Father   . COPD Sister   . Liver disease Brother   . Colon polyps Neg Hx   . Colon cancer Neg Hx    Social History   Socioeconomic History  . Marital status: Married    Spouse name: Jeneen Rinks  . Number of children: 3  . Years of education: 12+  . Highest education level: Some college, no degree  Occupational History  . Not on file  Tobacco Use  . Smoking status: Never Smoker  . Smokeless tobacco: Never Used  Vaping Use  . Vaping Use: Never used  Substance and Sexual Activity  . Alcohol use: No    Alcohol/week: 0.0 standard drinks  . Drug use: No  . Sexual activity: Not Currently    Birth control/protection: Surgical    Comment: hysterectomy  Other Topics Concern  . Not on file  Social History Narrative   Married for 21 years in second marriage.First marriage lasted 66 years.Lives with husband.   Social Determinants of Health    Financial Resource Strain: Not on file  Food Insecurity: Not on file  Transportation Needs: Not on file  Physical Activity: Not on file  Stress: Not on file  Social Connections: Not on file    Tobacco Counseling Counseling given: Yes   Clinical Intake:  Pre-visit preparation completed: Yes  Pain : 0-10 Pain Type: Chronic pain Pain Location: Neck Pain Orientation: Mid Pain Radiating Towards: shoulders Pain Descriptors / Indicators: Aching Pain Onset: More than a month ago Pain Frequency: Constant     BMI - recorded: 28.53 Nutritional Status: BMI 25 -29 Overweight Nutritional Risks: None Diabetes: No  How often do you need  to have someone help you when you read instructions, pamphlets, or other written materials from your doctor or pharmacy?: 2 - Rarely What is the last grade level you completed in school?: 12th grade  Diabetic? No  Interpreter Needed?: No  Information entered by :: Jeralyn Ruths, NP-C   Activities of Daily Living In your present state of health, do you have any difficulty performing the following activities: 09/21/2020 12/02/2019  Hearing? Y N  Vision? Y N  Difficulty concentrating or making decisions? Tempie Donning  Walking or climbing stairs? Y Y  Dressing or bathing? Y Y  Doing errands, shopping? Tempie Donning  Preparing Food and eating ? Y -  Using the Toilet? Y -  In the past six months, have you accidently leaked urine? Y -  Do you have problems with loss of bowel control? Y -  Managing your Medications? Y -  Managing your Finances? Y -  Housekeeping or managing your Housekeeping? Y -  Some recent data might be hidden    Patient Care Team: Doree Albee, MD as PCP - General (Internal Medicine) Harl Bowie Alphonse Guild, MD as PCP - Cardiology (Cardiology) Gala Romney Cristopher Estimable, MD as Consulting Physician (Gastroenterology) Harl Bowie Alphonse Guild, MD as Consulting Physician (Cardiology) Rod Can, MD as Consulting Physician (Orthopedic Surgery)  Indicate  any recent Medical Services you may have received from other than [facility] providers in the past year (date may be approximate).     Assessment:   This is a routine wellness examination for Nadiyah.  Hearing/Vision screen No exam data present  Dietary issues and exercise activities discussed: Current Exercise Habits: The patient does not participate in regular exercise at present, Exercise limited by: orthopedic condition(s)  Goals    . DIET - EAT MORE FRUITS AND VEGETABLES      Depression Screen PHQ 2/9 Scores 09/21/2020 01/19/2020 10/21/2019 09/07/2019  PHQ - 2 Score 3 0 0 6  PHQ- 9 Score 12 - - 13  Exception Documentation - Medical reason Medical reason -    Fall Risk Fall Risk  09/21/2020 01/19/2020 10/21/2019 09/07/2019 01/22/2018  Falls in the past year? 0 Exclusion - non ambulatory Exclusion - non ambulatory 0 Yes  Number falls in past yr: 0 1 - - -  Injury with Fall? 0 1 - - -  Risk for fall due to : No Fall Risks Impaired mobility - History of fall(s);Impaired mobility;Impaired vision Impaired balance/gait;Impaired mobility  Follow up Education provided;Falls prevention discussed;Falls evaluation completed Falls evaluation completed - - -    FALL RISK PREVENTION PERTAINING TO THE HOME:  Any stairs in or around the home? No  If so, are there any without handrails? No  Home free of loose throw rugs in walkways, pet beds, electrical cords, etc? Yes  Adequate lighting in your home to reduce risk of falls? Yes   ASSISTIVE DEVICES UTILIZED TO PREVENT FALLS:  Life alert? No  Use of a cane, walker or w/c? Yes  Grab bars in the bathroom? Yes  Shower chair or bench in shower? Yes  Elevated toilet seat or a handicapped toilet? Yes   TIMED UP AND GO:  Was the test performed? No .  Length of time to ambulate 10 feet: n/a sec.     Cognitive Function:     6CIT Screen 09/21/2020 09/07/2019  What Year? 0 points 0 points  What month? 0 points 0 points  What time? 0 points 0 points   Count back from 20 0 points 0 points  Months in reverse 0 points 4 points  Repeat phrase 10 points 2 points  Total Score 10 6    Immunizations Immunization History  Administered Date(s) Administered  . Influenza, High Dose Seasonal PF 07/22/2017  . Influenza-Unspecified 08/24/2019  . Moderna SARS-COVID-2 Vaccination 01/09/2020, 02/09/2020  . Pneumococcal Conjugate-13 09/15/2018    TDAP status: Due, Education has been provided regarding the importance of this vaccine. Advised may receive this vaccine at local pharmacy or Health Dept. Aware to provide a copy of the vaccination record if obtained from local pharmacy or Health Dept. Verbalized acceptance and understanding.  Flu Vaccine status: Completed at today's visit  Pneumococcal vaccine status: Due, Education has been provided regarding the importance of this vaccine. Advised may receive this vaccine at local pharmacy or Health Dept. Aware to provide a copy of the vaccination record if obtained from local pharmacy or Health Dept. Verbalized acceptance and understanding.  Covid-19 vaccine status: Completed vaccines  Qualifies for Shingles Vaccine? Yes   Zostavax completed No   Shingrix Completed?: No.    Education has been provided regarding the importance of this vaccine. Patient has been advised to call insurance company to determine out of pocket expense if they have not yet received this vaccine. Advised may also receive vaccine at local pharmacy or Health Dept. Verbalized acceptance and understanding.  Screening Tests Health Maintenance  Topic Date Due  . TETANUS/TDAP  Never done  . PNA vac Low Risk Adult (2 of 2 - PPSV23) 09/16/2019  . INFLUENZA VACCINE  05/14/2020  . COVID-19 Vaccine (3 - Booster for Moderna series) 08/10/2020  . DEXA SCAN  Completed    Health Maintenance  Health Maintenance Due  Topic Date Due  . TETANUS/TDAP  Never done  . PNA vac Low Risk Adult (2 of 2 - PPSV23) 09/16/2019  . INFLUENZA VACCINE   05/14/2020  . COVID-19 Vaccine (3 - Booster for Moderna series) 08/10/2020    Colorectal cancer screening: No longer required.   Mammogram status: No longer required due to age.  Bone Density status: Completed 2016. Results reflect: Bone density results: OSTEOPOROSIS. Repeat every 2 years. - Patient does not want to undergo additional screening at this time.  Lung Cancer Screening: (Low Dose CT Chest recommended if Age 34-80 years, 30 pack-year currently smoking OR have quit w/in 15years.) does not qualify.   Lung Cancer Screening Referral: N/A  Additional Screening:  Hepatitis C Screening: does qualify; Completed N/A does not want to complete at this time  Vision Screening: Recommended annual ophthalmology exams for early detection of glaucoma and other disorders of the eye. Is the patient up to date with their annual eye exam?  No  Who is the provider or what is the name of the office in which the patient attends annual eye exams?  If pt is not established with a provider, would they like to be referred to a provider to establish care? Yes .   Dental Screening: Recommended annual dental exams for proper oral hygiene  Community Resource Referral / Chronic Care Management: CRR required this visit?  No   CCM required this visit?  No      Plan:     I have personally reviewed and noted the following in the patient's chart:   . Medical and social history . Use of alcohol, tobacco or illicit drugs  . Current medications and supplements . Functional ability and status . Nutritional status . Physical activity . Advanced directives . List of other physicians . Hospitalizations,  surgeries, and ER visits in previous 12 months . Vitals . Screenings to include cognitive, depression, and falls . Referrals and appointments  In addition, I have reviewed and discussed with patient certain preventive protocols, quality metrics, and best practice recommendations. A written  personalized care plan for preventive services as well as general preventive health recommendations were provided to patient.     Ailene Ards, NP   09/21/2020   Patient to follow-up in 2-3 months or sooner as needed

## 2020-09-21 NOTE — Addendum Note (Signed)
Addended by: Anibal Henderson on: 09/21/2020 12:14 PM   Modules accepted: Orders

## 2020-09-21 NOTE — Progress Notes (Signed)
Referral to social work for help with medication management.

## 2020-09-26 ENCOUNTER — Other Ambulatory Visit: Payer: Self-pay | Admitting: Internal Medicine

## 2020-09-28 ENCOUNTER — Other Ambulatory Visit (INDEPENDENT_AMBULATORY_CARE_PROVIDER_SITE_OTHER): Payer: Self-pay | Admitting: Nurse Practitioner

## 2020-10-23 ENCOUNTER — Telehealth: Payer: Self-pay | Admitting: Cardiology

## 2020-10-23 MED ORDER — RIVAROXABAN 15 MG PO TABS
15.0000 mg | ORAL_TABLET | Freq: Every day | ORAL | 0 refills | Status: DC
Start: 2020-10-23 — End: 2020-11-06

## 2020-10-23 NOTE — Telephone Encounter (Signed)
Phone call from son Audry Pili- requesting samples of Rivaroxaban (XARELTO) 15 MG TABS tablet [[id number]]

## 2020-10-23 NOTE — Telephone Encounter (Signed)
Pt son will come pick up samples

## 2020-11-06 ENCOUNTER — Telehealth: Payer: Self-pay | Admitting: Cardiology

## 2020-11-06 ENCOUNTER — Ambulatory Visit: Payer: Medicare HMO | Admitting: Gastroenterology

## 2020-11-06 ENCOUNTER — Other Ambulatory Visit: Payer: Self-pay | Admitting: *Deleted

## 2020-11-06 MED ORDER — RIVAROXABAN 15 MG PO TABS: 15.0000 mg | ORAL_TABLET | Freq: Every day | ORAL | 1 refills | Status: DC

## 2020-11-06 NOTE — Telephone Encounter (Signed)
LM to inform son that we nor [place] office have Xarelto samples - spoke with Wynetta Emery and Wynetta Emery pt assistance and was instructed to complete new year application to see if pt can get Xarelto for no cost

## 2020-11-06 NOTE — Telephone Encounter (Signed)
Patient calling the office for samples of medication:   1.  What medication and dosage are you requesting samples for? Rivaroxaban (XARELTO) 15 MG TABS tablet  2.  Are you currently out of this medication? Yes.  Mr. Sara Garcia called to see if he could get 30 day supply of this medication for his mother.

## 2020-11-07 NOTE — Telephone Encounter (Signed)
Pt son aware and will come by office today and sign application - has 1 week of Xarelto samples and will contact next week (advised he could purchase a few pills from pharmacy to get pt through to February when pt insurance would cover more cost of medication while waiting for pt assistance)

## 2020-12-12 ENCOUNTER — Telehealth: Payer: Self-pay | Admitting: Cardiology

## 2020-12-12 MED ORDER — RIVAROXABAN 15 MG PO TABS: 15.0000 mg | ORAL_TABLET | Freq: Every day | ORAL | 0 refills | Status: DC

## 2020-12-12 NOTE — Telephone Encounter (Signed)
Left message on voicemail that xarelto 15 mg samples are available for pick up.

## 2020-12-12 NOTE — Telephone Encounter (Signed)
Patient calling the office for samples of medication:   1.  What medication and dosage are you requesting samples for?  XARELTO  15 MG  2.  Are you currently out of this medication?   ALMOST  [phone]

## 2020-12-20 ENCOUNTER — Ambulatory Visit (INDEPENDENT_AMBULATORY_CARE_PROVIDER_SITE_OTHER): Payer: Medicare HMO | Admitting: Internal Medicine

## 2020-12-21 ENCOUNTER — Other Ambulatory Visit: Payer: Self-pay | Admitting: Internal Medicine

## 2020-12-24 ENCOUNTER — Other Ambulatory Visit (INDEPENDENT_AMBULATORY_CARE_PROVIDER_SITE_OTHER): Payer: Self-pay | Admitting: Internal Medicine

## 2020-12-25 ENCOUNTER — Other Ambulatory Visit (INDEPENDENT_AMBULATORY_CARE_PROVIDER_SITE_OTHER): Payer: Self-pay | Admitting: Internal Medicine

## 2021-01-01 ENCOUNTER — Telehealth: Payer: Self-pay | Admitting: Cardiology

## 2021-01-01 MED ORDER — RIVAROXABAN 15 MG PO TABS: 15.0000 mg | ORAL_TABLET | Freq: Every day | ORAL | 0 refills | Status: DC

## 2021-01-01 NOTE — Telephone Encounter (Signed)
Patient calling the office for samples of medication:   1.  What medication and dosage are you requesting samples for?  XARELTON 15 MG   2.  Are you currently out of this medication?

## 2021-01-01 NOTE — Telephone Encounter (Signed)
Ricky notified - samples ready for pick up.

## 2021-01-09 ENCOUNTER — Telehealth (INDEPENDENT_AMBULATORY_CARE_PROVIDER_SITE_OTHER): Payer: Self-pay

## 2021-01-09 ENCOUNTER — Encounter (INDEPENDENT_AMBULATORY_CARE_PROVIDER_SITE_OTHER): Payer: Self-pay | Admitting: Internal Medicine

## 2021-01-09 ENCOUNTER — Ambulatory Visit (INDEPENDENT_AMBULATORY_CARE_PROVIDER_SITE_OTHER): Payer: Medicare HMO | Admitting: Internal Medicine

## 2021-01-09 ENCOUNTER — Other Ambulatory Visit: Payer: Self-pay

## 2021-01-09 VITALS — BP 124/64 | HR 90 | Temp 98.2°F | Ht 62.0 in | Wt 152.6 lb

## 2021-01-09 DIAGNOSIS — I10 Essential (primary) hypertension: Secondary | ICD-10-CM | POA: Diagnosis not present

## 2021-01-09 DIAGNOSIS — E039 Hypothyroidism, unspecified: Secondary | ICD-10-CM | POA: Diagnosis not present

## 2021-01-09 DIAGNOSIS — R5381 Other malaise: Secondary | ICD-10-CM

## 2021-01-09 DIAGNOSIS — G8929 Other chronic pain: Secondary | ICD-10-CM

## 2021-01-09 DIAGNOSIS — I480 Paroxysmal atrial fibrillation: Secondary | ICD-10-CM | POA: Diagnosis not present

## 2021-01-09 DIAGNOSIS — E559 Vitamin D deficiency, unspecified: Secondary | ICD-10-CM

## 2021-01-09 DIAGNOSIS — R5383 Other fatigue: Secondary | ICD-10-CM

## 2021-01-09 DIAGNOSIS — M25511 Pain in right shoulder: Secondary | ICD-10-CM | POA: Diagnosis not present

## 2021-01-09 NOTE — Telephone Encounter (Signed)
Son Audry Pili called and stated that Sara Garcia is taking Vitamin D3 3mcg/1000IU and she is taking 3 capsules 2 times per day and if we need to change this dosage to please let Audry Pili know at [phone].   Please advise.

## 2021-01-09 NOTE — Telephone Encounter (Signed)
[employer] and gave him the message. Ricky verbalized an understanding.

## 2021-01-09 NOTE — Telephone Encounter (Signed)
Okay I will make adjustments based on the blood work.

## 2021-01-09 NOTE — Telephone Encounter (Signed)
Audry Pili called back and stated there is a correction that patient is taking 3 capsules (3000 IU) once per day.  Please advise.

## 2021-01-09 NOTE — Progress Notes (Signed)
Metrics: Intervention Frequency ACO  Documented Smoking Status Yearly  Screened one or more times in 24 months  Cessation Counseling or  Active cessation medication Past 24 months  Past 24 months   Guideline developer: UpToDate (See UpToDate for funding source) Date Released: 2014       Wellness Office Visit  Subjective:  Patient ID: Sara Garcia, female    DOB: Aug 03, 1928  Age: 87 y.o. MRN: [id number]  CC: This lady comes in for follow-up of hypothyroidism, hypertension, paroxysmal atrial fibrillation, chronic right shoulder pain, vitamin D deficiency and malaise and fatigue. HPI  She was concerned about her right shoulder pain although her son tells me that since she started taking turmeric 1500 mg daily, it is improved significantly.  She takes vitamin D3 but she is not sure of the dose. She continues to take NP thyroid for her hypothyroidism. She continues to take Xarelto in view of her paroxysmal atrial fibrillation.  She has not followed up with cardiology for some time. She describes intermittent fatigue. She describes itchy skin and eventually did see a dermatologist for which she was given some sort of cream but she has not kept up with taking this cream on a regular basis. Past Medical History:  Diagnosis Date  . 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 Arthritis   . Asthma   . DVT (deep venous thrombosis) ([place]) 2011/2012  . Esophageal dysphagia    limited food and pill  . Esophageal reflux   . Essential hypertension, benign   . GERD (gastroesophageal reflux disease)   . Osteoporosis   . Overactive bladder   . Paroxysmal atrial fibrillation (HCC)    a. on Xarelto for anticoagulation  . 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 Stroke ([place])   . Unspecified prolapse of vaginal walls    Past Surgical History:  Procedure Laterality Date  . ABDOMINAL HYSTERECTOMY    . COLONOSCOPY W/ BIOPSIES  02/01/2004   RMR: Anal papilla with normal rectum/Sigmoid diverticula/Small polyps in the cecum  removed as described above. Inflammatory polyp  . ESOPHAGEAL DILATION     unknown year per patient.  . ESOPHAGEAL DILATION N/A 02/07/2015   RMR: Normal esophagus status post maloney dilation. Small hiatal hernia.   Marland Kitchen ESOPHAGOGASTRODUODENOSCOPY     unknown year per patient  . ESOPHAGOGASTRODUODENOSCOPY N/A 02/07/2015   RMR: Normal esophagus  status post  Maloney dilation. Small hiatal hernia  . FALSE ANEURYSM REPAIR Left 09/23/2017   Procedure: REPAIR FALSE ANEURYSM COMMON LEFT FEMORAL ARTERY WITH OSTEOTOMY OF BONE FRAGEMENT;  Surgeon: Angelia Mould, MD;  Location: [facility];  Service: Vascular;  Laterality: Left;  . FEMUR IM NAIL Left 06/11/2017   Procedure: INTRAMEDULLARY (IM) NAIL FEMORAL;  Surgeon: Rod Can, MD;  Location: [facility];  Service: Orthopedics;  Laterality: Left;  . LEG SURGERY Left   . NASAL SEPTUM SURGERY    . TONSILLECTOMY    . WRIST FRACTURE SURGERY       Family History  Problem Relation Age of Onset  . Heart disease Mother   . Stroke Mother   . Emphysema Father   . COPD Sister   . Liver disease Brother   . Colon polyps Neg Hx   . Colon cancer Neg Hx     Social History   Social History Narrative   Married for 21 years in second marriage.First marriage lasted 75 years.Lives with husband.   Social History   Tobacco Use  . Smoking status: Never Smoker  . Smokeless  tobacco: Never Used  Substance Use Topics  . Alcohol use: No    Alcohol/week: 0.0 standard drinks    Current Meds  Medication Sig  . acetaminophen (TYLENOL) 650 MG CR tablet Take 650 mg by mouth as needed for pain.   . cholecalciferol (VITAMIN D) 1000 units tablet Take 1,000 Units by mouth 3 (three) times daily.  . [employer]S VITAMIN B12 1000 MCG tablet TAKE 1 TABLET BY MOUTH EVERY DAY  . diltiazem (CARDIZEM SR) 60 MG 12 hr capsule Take 1 capsule (60 mg total) by mouth 2 (two) times daily.  . DULoxetine (CYMBALTA) 60 MG capsule TAKE 1 CAPSULE BY MOUTH 2 TIMES DAILY.  Marland Kitchen esomeprazole  (NEXIUM) 20 MG capsule TAKE 1 CAPSULE BY MOUTH ONCE DAILY BEFORE BREAKFAST  . fexofenadine (ALLEGRA) 60 MG tablet Take 60 mg by mouth daily.  . Fluocinolone Acetonide 0.01 % SHAM Apply 2 tablespoons of shampoo to the scalp daily as needed for up to 2 weeks; leave shampoo on scalp for 5 minutes then rinse.  Marland Kitchen Fluticasone-Salmeterol (ADVAIR) 250-50 MCG/DOSE AEPB Inhale 1 puff into the lungs daily.  . furosemide (LASIX) 20 MG tablet Take 1 tablet (20 mg total) by mouth 2 (two) times daily.  Marland Kitchen KLOR-CON M20 20 MEQ tablet TAKE 1 TABLET BY MOUTH TWICE A DAY  . Lidocaine 1.8 % PTCH Apply 1 patch topically.  . NP THYROID 60 MG tablet TAKE 1 TABLET BY MOUTH EVERY DAY  . polyethylene glycol powder (GLYCOLAX/MIRALAX) 17 GM/SCOOP powder Take 17 g by mouth daily.  . Rivaroxaban (XARELTO) 15 MG TABS tablet Take 1 tablet (15 mg total) by mouth daily with supper.     [facility] Office Visit from 01/09/2021 in [facility]  PHQ-9 Total Score 5      Objective:   Today's Vitals: BP 124/64   Pulse 90   Temp 98.2 F (36.8 C) (Temporal)   Ht 5\' 2"  (1.575 m)   Wt 152 lb 9.6 oz (69.2 kg)   SpO2 95%   BMI 27.91 kg/m  Vitals with BMI 01/09/2021 09/21/2020 07/24/2020  Height 5\' 2"  5\' 2"  -  Weight 152 lbs 10 oz 156 lbs -  BMI 89.2 11.94 -  Systolic 174 081 (No Data)  Diastolic 64 74 (No Data)  Pulse 90 67 -     Physical Exam  Frail lady but blood pressure in good control.  She has lost about 4 pounds.     Assessment   1. Hypothyroidism, adult   2. Essential hypertension   3. Paroxysmal atrial fibrillation (HCC)   4. Chronic right shoulder pain   5. Vitamin D deficiency disease   6. Malaise and fatigue       Tests ordered Orders Placed This Encounter  Procedures  . COMPLETE METABOLIC PANEL WITH GFR  . T3, free  . TSH  . VITAMIN D 25 Hydroxy (Vit-D Deficiency, Fractures)  . DHEA-sulfate     Plan: 1. Continue with NP thyroid and we will check thyroid function  today. 2. Continue with diltiazem for hypertension as well as rate control of atrial fibrillation.  Check renal function. 3. Continue with vitamin D3 and we will check levels today.  Also check DHEA levels. 4. I have suggested that the patient increase the dose of turmeric to 3000 mg daily to see if this will help further.  If the right shoulder pain does not improve significantly, we may well have to send her back to orthopedics, Dr. Luna Glasgow. 5. Follow-up in about  4 months.  Pneumococcal 23 was given today.   No orders of the defined types were placed in this encounter.   Doree Albee, MD

## 2021-01-10 LAB — COMPLETE METABOLIC PANEL WITH GFR
AG Ratio: 1.2 (calc) (ref 1.0–2.5)
ALT: 15 U/L (ref 6–29)
AST: 18 U/L (ref 10–35)
Albumin: 3.6 g/dL (ref 3.6–5.1)
Alkaline phosphatase (APISO): 90 U/L (ref 37–153)
BUN: 15 mg/dL (ref 7–25)
CO2: 24 mmol/L (ref 20–32)
Calcium: 9.2 mg/dL (ref 8.6–10.4)
Chloride: 107 mmol/L (ref 98–110)
Creat: 0.79 mg/dL (ref 0.60–0.88)
GFR, Est African American: 76 mL/min/{1.73_m2} (ref >=60)
GFR, Est Non African American: 66 mL/min/{1.73_m2} (ref >=60)
Globulin: 2.9 g/dL (calc) (ref 1.9–3.7)
Glucose, Bld: 110 mg/dL (ref 65–139)
Potassium: 4.3 mmol/L (ref 3.5–5.3)
Sodium: 141 mmol/L (ref 135–146)
Total Bilirubin: 0.6 mg/dL (ref 0.2–1.2)
Total Protein: 6.5 g/dL (ref 6.1–8.1)

## 2021-01-10 LAB — DHEA-SULFATE: DHEA-SO4: 14 ug/dL (ref 4–157)

## 2021-01-10 LAB — T3, FREE: T3, Free: 3.7 pg/mL (ref 2.3–4.2)

## 2021-01-10 LAB — VITAMIN D 25 HYDROXY (VIT D DEFICIENCY, FRACTURES): Vit D, 25-Hydroxy: 64 ng/mL (ref 30–100)

## 2021-01-10 LAB — TSH: TSH: 1.47 mIU/L (ref 0.40–4.50)

## 2021-01-11 NOTE — Progress Notes (Signed)
Call this patient.  Let her know that vitamin D levels are in a good range to continue with the same dose of vitamin D3.  DHEA levels are on the lower end of normal so I want her to start taking DHEA.  She can obtain this from over-the-counter.  The dose that she needs to get is DHEA 15 mg daily. I recommend the following website: www.lifeextension.com Follow-up as scheduled.Marland Kitchen

## 2021-02-01 ENCOUNTER — Telehealth: Payer: Self-pay | Admitting: Cardiology

## 2021-02-01 MED ORDER — RIVAROXABAN 15 MG PO TABS: 15.0000 mg | ORAL_TABLET | Freq: Every day | ORAL | 0 refills | Status: DC

## 2021-02-01 NOTE — Telephone Encounter (Signed)
Advised that xarelto samples available for pick up and also advised that f/u appt is pass due. Appointment arranged with Katina Dung, NP 02/06/2021 @10 :00 am. Son verbalized understanding.

## 2021-02-01 NOTE — Telephone Encounter (Signed)
New message     Patient calling the office for samples of medication:   1.  What medication and dosage are you requesting samples for? Xarelto  2.  Are you currently out of this medication?  Almost out

## 2021-02-05 NOTE — Progress Notes (Signed)
Cardiology Office Note  Date: 02/06/2021   ID: Sara Garcia, DOB Dec 14, 1927, MRN [id number]  PCP:  Doree Albee, MD  Cardiologist:  Carlyle Dolly, MD Electrophysiologist:  None   Chief Complaint: 6 month follow up  History of Present Illness: Sara Garcia is a 87 y.o. female with a history of HTN, PAF, CVA, hypotension.  Last seen by Dr. Harl Bowie via telemedicine 01/11/2020. She was on Lopressor 37.5 mg po bid for rate control of PAF, Xarelto 15 mg po daily for CVA prophylaxis.  Diltiazem had been increased to 60 mg 3 times daily. Her Lopressor had been stopped at a recent hospital admission 2/2 dehydration and low BP. BP was elevated at visit. Lopressor was re-started at 25 mg po bid for better HR control and BP control.  She is here today for 1 year follow-up.  She denies any recent acute illnesses or hospitalizations in the interim since last visit.  Denies any anginal or exertional symptoms, palpitations or arrhythmias.  No CVA-like symptoms.  No episodes of hypotension.  Blood pressure is well controlled today at 130/70.  Her son states she recently had lab work at her primary care provider's office and all lab work was within normal limits.  Her EKG today demonstrates normal sinus rhythm with a rate of 70.  She denies any lightheadedness, dizziness, presyncopal or syncopal episodes.  Denies any shortness of breath or DOE.  Denies any bleeding in stool or urine.  She states her stool is normally black because she is on ferrous sulfate for history of anemia.  Her son states she had been taking extra strength Tylenol chronically but it had been discontinued.  She continues on a regimen of diltiazem extended release 60 mg p.o. twice daily.  Continues on furosemide 20 mg p.o. twice daily.  Continues Lopressor 25 mg p.o. twice daily.  Continues Xarelto 15 mg daily.  Past Medical History:  Diagnosis Date  . 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 Arthritis   . Asthma   . DVT (deep venous thrombosis)  ([facility]) 2011/2012  . Esophageal dysphagia    limited food and pill  . Esophageal reflux   . Essential hypertension, benign   . GERD (gastroesophageal reflux disease)   . Osteoporosis   . Overactive bladder   . Paroxysmal atrial fibrillation (H[facility])    a. on Xarelto for anticoagulation  . 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 Stroke ([place])   . Unspecified prolapse of vaginal walls     Past Surgical History:  Procedure Laterality Date  . ABDOMINAL HYSTERECTOMY    . COLONOSCOPY W/ BIOPSIES  02/01/2004   RMR: Anal papilla with normal rectum/Sigmoid diverticula/Small polyps in the cecum removed as described above. Inflammatory polyp  . ESOPHAGEAL DILATION     unknown year per patient.  . ESOPHAGEAL DILATION N/A 02/07/2015   RMR: Normal esophagus status post maloney dilation. Small hiatal hernia.   Marland Kitchen ESOPHAGOGASTRODUODENOSCOPY     unknown year per patient  . ESOPHAGOGASTRODUODENOSCOPY N/A 02/07/2015   RMR: Normal esophagus  status post  Maloney dilation. Small hiatal hernia  . FALSE ANEURYSM REPAIR Left 09/23/2017   Procedure: REPAIR FALSE ANEURYSM COMMON LEFT FEMORAL ARTERY WITH OSTEOTOMY OF BONE FRAGEMENT;  Surgeon: Angelia Mould, MD;  Location: [facility];  Service: Vascular;  Laterality: Left;  . FEMUR IM NAIL Left 06/11/2017   Procedure: INTRAMEDULLARY (IM) NAIL FEMORAL;  Surgeon: Rod Can, MD;  Location: [facility];  Service: Orthopedics;  Laterality: Left;  .  LEG SURGERY Left   . NASAL SEPTUM SURGERY    . TONSILLECTOMY    . WRIST FRACTURE SURGERY      Current Outpatient Medications  Medication Sig Dispense Refill  . acetaminophen (TYLENOL) 650 MG CR tablet Take 650 mg by mouth as needed for pain.     . cholecalciferol (VITAMIN D) 1000 units tablet Take 1,000 Units by mouth 3 (three) times daily.    . CVS VITAMIN B12 1000 MCG tablet TAKE 1 TABLET BY MOUTH EVERY DAY 90 tablet 1  . diltiazem (CARDIZEM SR) 60 MG 12 hr capsule Take 1 capsule (60 mg total) by mouth 2 (two) times daily.  180 capsule 0  . DULoxetine (CYMBALTA) 60 MG capsule TAKE 1 CAPSULE BY MOUTH 2 TIMES DAILY. 60 capsule 2  . esomeprazole (NEXIUM) 20 MG capsule TAKE 1 CAPSULE BY MOUTH ONCE DAILY BEFORE BREAKFAST 30 capsule 3  . fexofenadine (ALLEGRA) 60 MG tablet Take 60 mg by mouth daily.    . Fluocinolone Acetonide 0.01 % SHAM Apply 2 tablespoons of shampoo to the scalp daily as needed for up to 2 weeks; leave shampoo on scalp for 5 minutes then rinse. 120 mL 2  . Fluticasone-Salmeterol (ADVAIR) 250-50 MCG/DOSE AEPB Inhale 1 puff into the lungs daily. 60 each 3  . furosemide (LASIX) 20 MG tablet Take 1 tablet (20 mg total) by mouth 2 (two) times daily. 180 tablet 1  . KLOR-CON M20 20 MEQ tablet TAKE 1 TABLET BY MOUTH TWICE A DAY 180 tablet 0  . Lidocaine 1.8 % PTCH Apply 1 patch topically.    . metoprolol tartrate (LOPRESSOR) 25 MG tablet Take 25 mg by mouth 2 (two) times daily.    . NP THYROID 60 MG tablet TAKE 1 TABLET BY MOUTH EVERY DAY 30 tablet 3  . polyethylene glycol powder (GLYCOLAX/MIRALAX) 17 GM/SCOOP powder Take 17 g by mouth daily. 3350 g 1  . Rivaroxaban (XARELTO) 15 MG TABS tablet Take 1 tablet (15 mg total) by mouth daily with supper. 35 tablet 0   No current facility-administered medications for this visit.   Allergies:  Latex, Adhesive [tape], Betadine [povidone iodine], Ciprofloxacin, Codeine, Penicillins, Sulfa antibiotics, Tramadol, Wellbutrin [bupropion], and Aloe   Social History: The patient  reports that she has never smoked. She has never used smokeless tobacco. She reports that she does not drink alcohol and does not use drugs.   Family History: The patient's family history includes COPD in her sister; Emphysema in her father; Heart disease in her mother; Liver disease in her brother; Stroke in her mother.   ROS:  Please see the history of present illness. Otherwise, complete review of systems is positive for none.  All other systems are reviewed and negative.   Physical  Exam: VS:  BP 130/70   Pulse 68   Ht 5\' 2"  (1.575 m)   Wt 157 lb 6.4 oz (71.4 kg)   SpO2 98%   BMI 28.79 kg/m , BMI Body mass index is 28.79 kg/m.  Wt Readings from Last 3 Encounters:  02/06/21 157 lb 6.4 oz (71.4 kg)  01/09/21 152 lb 9.6 oz (69.2 kg)  09/21/20 156 lb (70.8 kg)    General: Patient appears comfortable at rest. Neck: Supple, no elevated JVP or carotid bruits, no thyromegaly. Lungs: Clear to auscultation, nonlabored breathing at rest. Cardiac: Regular rate and rhythm, no S3 or significant systolic murmur, no pericardial rub. Extremities: No pitting edema, distal pulses 2+. Skin: Warm and dry. Musculoskeletal: No kyphosis. Neuropsychiatric:  Alert and oriented x3, affect grossly appropriate.  ECG:  An ECG dated 02/06/2021 was personally reviewed today and demonstrated:  Normal sinus rhythm rate of 70.  Recent Labwork: 07/04/2020: Hemoglobin 13.0; Platelets 301 01/09/2021: ALT 15; AST 18; BUN 15; Creat 0.79; Potassium 4.3; Sodium 141; TSH 1.47     Component Value Date/Time   CHOL 153 01/22/2018 1039   TRIG 141 01/22/2018 1039   HDL 58 01/22/2018 1039   CHOLHDL 2.6 01/22/2018 1039   CHOLHDL 2.6 07/22/2017 0358   VLDL 18 07/22/2017 0358   LDLCALC 67 01/22/2018 1039    Other Studies Reviewed Today:  12/2013 Lexicscan MPI Impression Exercise Capacity: Lexiscan with no exercise.  BP Response: Normal blood pressure response.  Clinical Symptoms: Nausea  ECG Impression: No significant ST segment change suggestive of ischemia.  Comparison with Prior Nuclear Study: No previous nuclear study performed  Overall Impression: Normal stress nuclear study.  LV Ejection Fraction: 85%. LV Wall Motion: NL LV Function; NL Wall Motion    Jan 2015 echo Study Conclusions  - Study data: Technically difficult study. - Left ventricle: The cavity size was normal. Wall thickness was increased in a pattern of mild LVH. There is mild basal septal hypertrophy without  obstructive gradient. Systolic function was normal. The estimated ejection fraction was in the range of 60% to 65%. Doppler parameters are consistent with abnormal left ventricular relaxation (grade 1 diastolic dysfunction). - Aortic valve: Mildly calcified annulus. Trileaflet; mildly thickened leaflets. Trivial regurgitation. Valve area: 1.69cm^2(VTI). Valve area: 1.79cm^2 (Vmax). - Mitral valve: Mildly calcified annulus. Mildly thickened leaflets .   01/2015 Holter monitor No significant arrhythmias   07/2017 echo Study Conclusions  - Left ventricle: The cavity size was normal. There was mild focal basal hypertrophy of the septum. Systolic function was normal. The estimated ejection fraction was in the range of 55% to 60%. Doppler parameters are consistent with both elevated ventricular end-diastolic filling pressure and elevated left atrial filling pressure. - Mitral valve: Calcified annulus. Mildly thickened leaflets . - Right ventricle: The cavity size was mildly dilated. - Atrial septum: No defect or patent foramen ovale was identified.  Assessment and Plan:  1. Paroxysmal atrial fibrillation (HCC)   2. Essential hypertension    1. Paroxysmal atrial fibrillation (HCC) Denies any recent palpitations or arrhythmias.  EKG today shows normal sinus rhythm with rate of 70.  Continue diltiazem SR 60 mg every 12 hours.  Continue Xarelto 15 mg p.o. daily.  2. Essential hypertension Blood pressure is well controlled today at 130/70.  Continue metoprolol 25 mg p.o. twice daily.  Medication Adjustments/Labs and Tests Ordered: Current medicines are reviewed at length with the patient today.  Concerns regarding medicines are outlined above.   Disposition: Follow-up with Dr. Harl Bowie or APP 1 year  Signed, Levell July, NP 02/06/2021 10:58 AM    [facility] [place], [place], [place] [address] Phone: [phone]; Fax: [phone]

## 2021-02-06 ENCOUNTER — Encounter: Payer: Self-pay | Admitting: Family Medicine

## 2021-02-06 ENCOUNTER — Ambulatory Visit: Payer: Medicare HMO | Admitting: Family Medicine

## 2021-02-06 VITALS — BP 130/70 | HR 68 | Ht 62.0 in | Wt 157.4 lb

## 2021-02-06 DIAGNOSIS — I10 Essential (primary) hypertension: Secondary | ICD-10-CM

## 2021-02-06 DIAGNOSIS — I480 Paroxysmal atrial fibrillation: Secondary | ICD-10-CM

## 2021-02-06 NOTE — Patient Instructions (Signed)

## 2021-02-09 ENCOUNTER — Other Ambulatory Visit: Payer: Self-pay | Admitting: Cardiology

## 2021-02-10 ENCOUNTER — Other Ambulatory Visit: Payer: Self-pay | Admitting: Cardiology

## 2021-02-16 ENCOUNTER — Other Ambulatory Visit: Payer: Self-pay | Admitting: Cardiology

## 2021-02-16 ENCOUNTER — Other Ambulatory Visit (INDEPENDENT_AMBULATORY_CARE_PROVIDER_SITE_OTHER): Payer: Self-pay | Admitting: Nurse Practitioner

## 2021-02-17 ENCOUNTER — Other Ambulatory Visit: Payer: Self-pay | Admitting: Cardiology

## 2021-02-26 ENCOUNTER — Other Ambulatory Visit (INDEPENDENT_AMBULATORY_CARE_PROVIDER_SITE_OTHER): Payer: Self-pay | Admitting: Cardiology

## 2021-02-26 DIAGNOSIS — E876 Hypokalemia: Secondary | ICD-10-CM

## 2021-03-05 ENCOUNTER — Telehealth: Payer: Self-pay | Admitting: Family Medicine

## 2021-03-05 NOTE — Telephone Encounter (Signed)
3 Sample bottles of Xarelto 15 mg placed at front desk for pt to pick up. Lot# [id number], Exp: 02/24  Called to notify pt. No answer. Left msg to call back.

## 2021-03-05 NOTE — Telephone Encounter (Signed)
Patient calling the office for samples of medication:   1.  What medication and dosage are you requesting samples for? Xarelto  2.  Are you currently out of this medication? 3-4 days left

## 2021-03-05 NOTE — Telephone Encounter (Signed)
Pt notified that samples are ready to be picked up in office.

## 2021-03-24 ENCOUNTER — Other Ambulatory Visit (INDEPENDENT_AMBULATORY_CARE_PROVIDER_SITE_OTHER): Payer: Self-pay | Admitting: Internal Medicine

## 2021-03-27 ENCOUNTER — Telehealth: Payer: Self-pay | Admitting: Cardiology

## 2021-03-27 MED ORDER — RIVAROXABAN 15 MG PO TABS: 15.0000 mg | ORAL_TABLET | Freq: Every day | ORAL | 0 refills | Status: DC

## 2021-03-27 NOTE — Telephone Encounter (Signed)
Advised that samples are available for pick up Advised that 2022 out of pocket expense is needed for application to [employer] to get approved Advised important since we don't always have samples Verbalized understanding

## 2021-03-27 NOTE — Telephone Encounter (Signed)
Pt's son called requesting samples for Rivaroxaban (XARELTO) 15 MG TABS tablet [[id number]]

## 2021-04-03 ENCOUNTER — Other Ambulatory Visit: Payer: Self-pay

## 2021-04-03 ENCOUNTER — Ambulatory Visit (INDEPENDENT_AMBULATORY_CARE_PROVIDER_SITE_OTHER): Payer: Medicare HMO | Admitting: Otolaryngology

## 2021-04-03 DIAGNOSIS — H6123 Impacted cerumen, bilateral: Secondary | ICD-10-CM

## 2021-04-03 NOTE — Progress Notes (Signed)
HPI: Sara Garcia is a 87 y.o. female who presents for evaluation of wax buildup in ears especially on the right side which is worse.  She presents with her daughter and is in a wheelchair.  She has not had previous hearing test..  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 Inflammatory polyp   ESOPHAGEAL DILATION     unknown year per patient.   ESOPHAGEAL DILATION N/A 02/07/2015   RMR: Normal esophagus status post maloney dilation. Small hiatal hernia.    ESOPHAGOGASTRODUODENOSCOPY     unknown year per patient   ESOPHAGOGASTRODUODENOSCOPY N/A 02/07/2015   RMR: Normal esophagus  status post  Maloney dilation. Small hiatal hernia   FALSE ANEURYSM REPAIR Left 09/23/2017   Procedure: REPAIR FALSE ANEURYSM COMMON LEFT FEMORAL ARTERY WITH OSTEOTOMY OF BONE FRAGEMENT;  Surgeon: Angelia Mould, MD;  Location: [facility];  Service: Vascular;  Laterality: Left;   FEMUR IM NAIL Left 06/11/2017   Procedure: INTRAMEDULLARY (IM) NAIL FEMORAL;  Surgeon: Rod Can, MD;  Location[facility];  Service: Orthopedics;  Laterality: Left;   LEG SURGERY Left    NASAL SEPTUM SURGERY     TONSILLECTOMY     WRIST FRACTURE SURGERY     Social History   Socioeconomic History   Marital status: Married    Spouse name: Jeneen Rinks   Number of  children: 3   Years of education: 12+   Highest education level: Some college, no degree  Occupational History   Not on file  Tobacco Use   Smoking status: Never   Smokeless tobacco: Never  Vaping Use   Vaping Use: Never used  Substance and Sexual Activity   Alcohol use: No    Alcohol/week: 0.0 standard drinks   Drug use: No   Sexual activity: Not Currently    Birth control/protection: Surgical    Comment: hysterectomy  Other Topics Concern   Not on file  Social History Narrative   Married for 21 years in second marriage.First marriage lasted 30 years.Lives with husband.   Social Determinants of Health   Financial Resource Strain: Not on file  Food Insecurity: Not on file  Transportation Needs: Not on file  Physical Activity: Not on file  Stress: Not on file  Social Connections: Not on file   Family History  Problem Relation Age of Onset   Heart disease Mother    Stroke Mother    Emphysema Father    COPD Sister    Liver disease Brother    Colon polyps Neg Hx    Colon cancer Neg Hx    Allergies  Allergen Reactions   Latex Itching and Rash   Adhesive [Tape] Other (See Comments)    Tears skin   Betadine [Povidone  Iodine]     blisters   Ciprofloxacin Nausea And Vomiting   Codeine Nausea And Vomiting    Upsets stomach, nightmares   Penicillins Hives    Has patient had a PCN reaction causing immediate rash, facial/tongue/throat swelling, SOB or lightheadedness with hypotension: yes Has patient had a PCN reaction causing severe rash involving mucus membranes or skin necrosis: yes Has patient had a PCN reaction that required hospitalization: no Has patient had a PCN reaction occurring within the last 10 years: no If all of the above answers are "NO", then may proceed with Cephalosporin use.    Sulfa Antibiotics Hives   Tramadol    Wellbutrin [Bupropion]     Reaction unknown   Aloe Rash   Prior to Admission medications   Medication Sig Start Date End Date  Taking? Authorizing Provider  acetaminophen (TYLENOL) 650 MG CR tablet Take 650 mg by mouth as needed for pain.     [provider]  cholecalciferol (VITAMIN D) 1000 units tablet Take 1,000 Units by mouth 3 (three) times daily.    [provider]  [facility] VITAMIN B12 1000 MCG tablet TAKE 1 TABLET BY MOUTH EVERY DAY 02/16/21   Gosrani, Nimish C, MD  diltiazem (CARDIZEM SR) 60 MG 12 hr capsule TAKE 1 CAPSULE (60 MG TOTAL) BY MOUTH IN THE MORNING, AT NOON, AND AT BEDTIME. 02/12/21   Branch, Alphonse Guild, MD  DULoxetine (CYMBALTA) 60 MG capsule TAKE 1 CAPSULE BY MOUTH TWICE A DAY 03/26/21   Gosrani, Nimish C, MD  esomeprazole (NEXIUM) 20 MG capsule TAKE 1 CAPSULE BY MOUTH ONCE DAILY BEFORE BREAKFAST 01/27/20   Gosrani, Nimish C, MD  fexofenadine (ALLEGRA) 60 MG tablet Take 60 mg by mouth daily.    [provider]  Fluocinolone Acetonide 0.01 % SHAM Apply 2 tablespoons of shampoo to the scalp daily as needed for up to 2 weeks; leave shampoo on scalp for 5 minutes then rinse. 01/27/20   Doree Albee, MD  Fluticasone-Salmeterol (ADVAIR) 250-50 MCG/DOSE AEPB Inhale 1 puff into the lungs daily. 01/27/20   Doree Albee, MD  furosemide (LASIX) 20 MG tablet TAKE 1 TABLET BY MOUTH TWICE A DAY 02/26/21   Arnoldo Lenis, MD  KLOR-CON M20 20 MEQ tablet TAKE 1 TABLET BY MOUTH TWICE A DAY 12/21/20   Gosrani, Nimish C, MD  Lidocaine 1.8 % PTCH Apply 1 patch topically.    [provider]  metoprolol tartrate (LOPRESSOR) 25 MG tablet TAKE 1 TABLET BY MOUTH 2 TIMES DAILY 02/19/21   Arnoldo Lenis, MD  NP THYROID 60 MG tablet TAKE 1 TABLET BY MOUTH EVERY DAY 12/25/20   Hurshel Party C, MD  polyethylene glycol powder (GLYCOLAX/MIRALAX) 17 GM/SCOOP powder Take 17 g by mouth daily. 01/27/20   Doree Albee, MD  Rivaroxaban (XARELTO) 15 MG TABS tablet Take 1 tablet (15 mg total) by mouth daily with supper. 03/27/21   Arnoldo Lenis, MD     Positive ROS: Otherwise negative  All  other systems have been reviewed and were otherwise negative with the exception of those mentioned in the HPI and as above.  Physical Exam: Constitutional: Alert, well-appearing, no acute distress Ears: External ears without lesions or tenderness. Ear canals with a large amount of wax in the right ear canal which is slightly smaller than the left ear canal.  The left ear canal which is partially occluded with cerumen that was cleaned with suction and hydrogen peroxide bilaterally.  TMs were clear bilaterally.  Following cleaning the ears she felt like her hearing improved and ears felt better.. Nasal: External nose without lesions. Clear nasal passages Oral: Oropharynx clear. Neck: No palpable adenopathy or masses Respiratory: Breathing comfortably  Skin: No facial/neck lesions or rash noted.  Cerumen impaction removal  Date/Time: 04/03/2021 4:19 PM Performed by: Rozetta Nunnery, MD Authorized by: Rozetta Nunnery, MD   Consent:    Consent obtained:  Verbal   Consent given by:  Patient Procedure details:    Location:  L ear and R ear   Procedure type: irrigation and suction   Comments:     Ear canals were cleaned with hydroperoxide and suction.  Right ear canal was a little bit smaller than the left ear canal with more wax buildup on the right side.  After cleaning the wax from her ear canals the TMs themselves were clear bilaterally.  Assessment: Cerumen impaction bilaterally right side worse than left.  Plan: Ear canals were cleaned in the office today. Concerning any hearing problems she has recommended obtaining audiologic testing to evaluate degree of hearing loss and to see if she wants use of a hearing aid.  Radene Journey, MD

## 2021-04-04 ENCOUNTER — Ambulatory Visit (INDEPENDENT_AMBULATORY_CARE_PROVIDER_SITE_OTHER): Payer: Medicare HMO | Admitting: Otolaryngology

## 2021-04-06 ENCOUNTER — Other Ambulatory Visit: Payer: Self-pay | Admitting: Internal Medicine

## 2021-04-18 ENCOUNTER — Other Ambulatory Visit (INDEPENDENT_AMBULATORY_CARE_PROVIDER_SITE_OTHER): Payer: Self-pay | Admitting: Internal Medicine

## 2021-04-20 ENCOUNTER — Telehealth: Payer: Self-pay | Admitting: Cardiology

## 2021-04-20 NOTE — Telephone Encounter (Signed)
REQUESTING SAMPLES FOR Rivaroxaban (XARELTO) 15 MG TABS tablet [[id number]]

## 2021-04-20 NOTE — Telephone Encounter (Signed)
Pt son states pt applied for PAF last week. Pt son aware that policy for distributing samples has changes. Pt son verbalized understanding   Xarelto Sample: Lot #: [id number] Exp: 04/24

## 2021-04-20 NOTE — Telephone Encounter (Signed)
Left message for pt to call office back

## 2021-04-23 ENCOUNTER — Ambulatory Visit (INDEPENDENT_AMBULATORY_CARE_PROVIDER_SITE_OTHER): Payer: Medicare HMO | Admitting: Internal Medicine

## 2021-04-26 ENCOUNTER — Telehealth: Payer: Self-pay | Admitting: *Deleted

## 2021-04-26 NOTE — Telephone Encounter (Signed)
3 items faxed to [facility]   [facility]   Patient prescription record   -  Burna Cash   [facility]   Patient prescription record  -  Lujean Rave

## 2021-05-01 ENCOUNTER — Other Ambulatory Visit (INDEPENDENT_AMBULATORY_CARE_PROVIDER_SITE_OTHER): Payer: Self-pay | Admitting: Internal Medicine

## 2021-05-08 ENCOUNTER — Encounter (INDEPENDENT_AMBULATORY_CARE_PROVIDER_SITE_OTHER): Payer: Self-pay | Admitting: Internal Medicine

## 2021-05-08 ENCOUNTER — Ambulatory Visit (INDEPENDENT_AMBULATORY_CARE_PROVIDER_SITE_OTHER): Payer: Medicare HMO | Admitting: Internal Medicine

## 2021-05-08 ENCOUNTER — Other Ambulatory Visit: Payer: Self-pay

## 2021-05-08 VITALS — BP 126/71 | HR 71 | Temp 97.6°F | Resp 17 | Ht 62.0 in | Wt 141.0 lb

## 2021-05-08 DIAGNOSIS — M791 Myalgia, unspecified site: Secondary | ICD-10-CM | POA: Diagnosis not present

## 2021-05-08 DIAGNOSIS — I10 Essential (primary) hypertension: Secondary | ICD-10-CM | POA: Diagnosis not present

## 2021-05-08 DIAGNOSIS — L299 Pruritus, unspecified: Secondary | ICD-10-CM | POA: Diagnosis not present

## 2021-05-08 DIAGNOSIS — E039 Hypothyroidism, unspecified: Secondary | ICD-10-CM

## 2021-05-08 MED ORDER — RIVAROXABAN 15 MG PO TABS: 15.0000 mg | ORAL_TABLET | Freq: Every day | ORAL | 1 refills | Status: DC

## 2021-05-08 NOTE — Progress Notes (Signed)
Metrics: Intervention Frequency ACO  Documented Smoking Status Yearly  Screened one or more times in 24 months  Cessation Counseling or  Active cessation medication Past 24 months  Past 24 months   Guideline developer: UpToDate (See UpToDate for funding source) Date Released: 2014       Wellness Office Visit  Subjective:  Patient ID: Sara Garcia, female    DOB: [birth year]  Age: 86 y.o. MRN: [id number]  CC: This lady comes in for follow-up of hypertension, hypothyroidism, itchy scalp, osteoarthritic pains and myalgia. HPI  Since the patient has been taking turmeric and CBD oil, she has seen tremendous benefit.  She is also taking DHEA that I recommended on the last appointment.  She does not require Tylenol or ibuprofen any longer. She describes an itchy scalp for which she has seen dermatology in the past.  She was prescribed steroid shampoo but I do not think she is taking regularly. She needs refill of her anticoagulation medicine today. 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 Inflammatory polyp   ESOPHAGEAL DILATION     unknown year per patient.   ESOPHAGEAL DILATION N/A 02/07/2015   RMR: Normal esophagus status post maloney dilation. Small hiatal hernia.    ESOPHAGOGASTRODUODENOSCOPY     unknown year per  patient   ESOPHAGOGASTRODUODENOSCOPY N/A 02/07/2015   RMR: Normal esophagus  status post  Maloney dilation. Small hiatal hernia   FALSE ANEURYSM REPAIR Left 09/23/2017   Procedure: REPAIR FALSE ANEURYSM COMMON LEFT FEMORAL ARTERY WITH OSTEOTOMY OF BONE FRAGEMENT;  Surgeon: Angelia Mould, MD;  Location: [facility];  Service: Vascular;  Laterality: Left;   FEMUR IM NAIL Left 06/11/2017   Procedure: INTRAMEDULLARY (IM) NAIL FEMORAL;  Surgeon: Rod Can, MD;  [facility];  Service: Orthopedics;  Laterality: Left;   LEG SURGERY Left    NASAL SEPTUM SURGERY     TONSILLECTOMY     WRIST FRACTURE SURGERY       Family History  Problem Relation Age of Onset   Heart disease Mother    Stroke Mother    Emphysema Father    COPD Sister    Liver disease Brother    Colon polyps Neg Hx    Colon cancer Neg Hx     Social History   Social History Narrative   Married for 21 years in second marriage.First marriage lasted 62 years.Lives with husband.   Social History   Tobacco Use   Smoking status: Never   Smokeless tobacco: Never  Substance Use Topics   Alcohol use: No    Alcohol/week: 0.0 standard drinks    Current Meds  Medication Sig   cholecalciferol (VITAMIN D) 1000 units tablet Take 1,000 Units by mouth 3 (three) times  daily.   [facility] VITAMIN B12 1000 MCG tablet TAKE 1 TABLET BY MOUTH EVERY DAY   diltiazem (CARDIZEM SR) 60 MG 12 hr capsule TAKE 1 CAPSULE (60 MG TOTAL) BY MOUTH IN THE MORNING, AT NOON, AND AT BEDTIME.   DULoxetine (CYMBALTA) 60 MG capsule TAKE 1 CAPSULE BY MOUTH TWICE A DAY   esomeprazole (NEXIUM) 20 MG capsule TAKE 1 CAPSULE BY MOUTH ONCE DAILY BEFORE BREAKFAST   fexofenadine (ALLEGRA) 60 MG tablet Take 60 mg by mouth daily.   Fluocinolone Acetonide 0.01 % SHAM Apply 2 tablespoons of shampoo to the scalp daily as needed for up to 2 weeks; leave shampoo on scalp for 5 minutes then rinse.   Fluticasone-Salmeterol (ADVAIR) 250-50 MCG/DOSE AEPB Inhale 1 puff into  the lungs daily.   furosemide (LASIX) 20 MG tablet TAKE 1 TABLET BY MOUTH TWICE A DAY   KLOR-CON M20 20 MEQ tablet TAKE 1 TABLET BY MOUTH TWICE A DAY   metoprolol tartrate (LOPRESSOR) 25 MG tablet TAKE 1 TABLET BY MOUTH 2 TIMES DAILY   NON FORMULARY Take 2 tablets by mouth 2 (two) times daily at 10 am and 4 pm. CBD gummies; 2 chews 2 times a day.   NP THYROID 60 MG tablet TAKE 1 TABLET BY MOUTH EVERY DAY   Nutritional Supplements (DHEA) 15-50 MG CAPS Take 15 mg by mouth daily at 12 noon.   polyethylene glycol powder (GLYCOLAX/MIRALAX) 17 GM/SCOOP powder Take 17 g by mouth daily.   Turmeric (QC TUMERIC COMPLEX PO) Take 1,000 mg by mouth daily.   [DISCONTINUED] Rivaroxaban (XARELTO) 15 MG TABS tablet Take 1 tablet (15 mg total) by mouth daily with supper.     [place] Office Visit from 01/09/2021 in [facility]  PHQ-9 Total Score 5       Objective:   Today's Vitals: BP 126/71 (BP Location: Left Arm, Patient Position: Sitting, Cuff Size: Small)   Pulse 71   Temp 97.6 F (36.4 C) (Temporal)   Resp 17   Ht '5\' 2"'$  (1.575 m)   Wt 141 lb (64 kg) Comment: new wt  SpO2 97%   BMI 25.79 kg/m  Vitals with BMI 05/08/2021 02/06/2021 01/09/2021  Height '5\' 2"'$  '5\' 2"'$  '5\' 2"'$   Weight 141 lbs 157 lbs 6 oz 152 lbs 10 oz  BMI 25.78 123456 [id number]  Systolic [id number]  Diastolic 71 70 64  Pulse 71 68 90     Physical Exam  She has lost 16 pounds according to our scales.  Blood pressure is excellent.     Assessment   1. Essential hypertension   2. Hypothyroidism, adult   3. Myalgia   4. Itchy scalp       Tests ordered No orders of the defined types were placed in this encounter.    Plan: 1.  Continue with diltiazem and metoprolol for hypertension as well as history of paroxysmal atrial fibrillation.  She appears to be in sinus rhythm today. 2.  Continue with NP thyroid as before. 3.  Continue with turmeric, CBD oil and DHEA. 4.  I recommended that she go to the  dermatologist again regarding her itchy scalp. 5.  Follow-up with Judson Roch in December as previously scheduled    Meds ordered this encounter  Medications   Rivaroxaban (XARELTO) 15 MG TABS tablet    Sig: Take 1 tablet (15 mg total) by mouth daily with supper.    Dispense:  90 tablet    Refill:  1  Doree Albee, MD

## 2021-05-17 ENCOUNTER — Encounter (INDEPENDENT_AMBULATORY_CARE_PROVIDER_SITE_OTHER): Payer: Self-pay

## 2021-07-13 ENCOUNTER — Other Ambulatory Visit: Payer: Self-pay | Admitting: Nurse Practitioner

## 2021-08-31 ENCOUNTER — Other Ambulatory Visit (INDEPENDENT_AMBULATORY_CARE_PROVIDER_SITE_OTHER): Payer: Self-pay | Admitting: Nurse Practitioner

## 2021-09-13 ENCOUNTER — Telehealth: Payer: Self-pay | Admitting: Cardiology

## 2021-09-13 DIAGNOSIS — C50919 Malignant neoplasm of unspecified site of unspecified female breast: Secondary | ICD-10-CM

## 2021-09-13 HISTORY — DX: Malignant neoplasm of unspecified site of unspecified female breast: C50.919

## 2021-09-13 HISTORY — PX: MASTECTOMY: SHX3

## 2021-09-13 NOTE — Telephone Encounter (Signed)
Patient with diagnosis of atrial fibrillation on Xarelto for anticoagulation.    Procedure: mastectomy Date of procedure: 09/18/21   CHA2DS2-VASc Score = 6   This indicates a 9.7% annual risk of stroke. The patient's score is based upon: CHF History: 0 HTN History: 1 Diabetes History: 0 Stroke History: 2 Vascular Disease History: 0 Age Score: 2 Gender Score: 1   CrCl 39 (with adjusted body weight) Platelet count 266  Patient with history of embolic stroke 91/2258 and DVT sometime prior to 2015 (unknown of provoked)  Would recommend 2 day hold on Xarelto prior to procedure but will defer to Dr. Harl Bowie for final decision

## 2021-09-13 NOTE — Telephone Encounter (Signed)
   [facility] Pre-operative Risk Assessment    Patient Name: Sara Garcia  DOB: [birth year] MRN: [id number]  HEARTCARE STAFF:  - IMPORTANT!!!!!! Under Visit Info/Reason for Call, type in Other and utilize the format Clearance MM/DD/YY or Clearance TBD. Do not use dashes or single digits. - Please review there is not already an duplicate clearance open for this procedure. - If request is for dental extraction, please clarify the # of teeth to be extracted. - If the patient is currently at the dentist's office, call Pre-Op Callback Staff (MA/nurse) to input urgent request.  - If the patient is not currently in the dentist office, please route to the Pre-Op pool.  Request for surgical clearance:  What type of surgery is being performed? Mastectomy  When is this surgery scheduled? 09/18/2021  What type of clearance is required (medical clearance vs. Pharmacy clearance to hold med vs. Both)? med  Are there any medications that need to be held prior to surgery and how long? Xarelto not indicated on how long  Practice name and name of physician performing surgery? [facility] [place]/ Dr.Straughan  What is the office phone number? [phone]   7.   What is the office fax number? [phone]  8.   Anesthesia type (None, local, MAC, general) ? Not indicated    Tildon Husky 09/13/2021, 5:12 PM  _________________________________________________________________   (provider comments below)

## 2021-09-13 NOTE — Telephone Encounter (Signed)
Pharmacy, can you please comment on how long Xarelto can be held for upcoming procedure?  Thank you! 

## 2021-09-13 NOTE — Telephone Encounter (Signed)
   [facility] Pre-operative Risk Assessment    Patient Name: Sara Garcia  DOB: 01/03/1928 MRN: [id number]  HEARTCARE STAFF:  - IMPORTANT!!!!!! Under Visit Info/Reason for Call, type in Other and utilize the format Clearance MM/DD/YY or Clearance TBD. Do not use dashes or single digits. - Please review there is not already an duplicate clearance open for this procedure. - If request is for dental extraction, please clarify the # of teeth to be extracted. - If the patient is currently at the dentist's office, call Pre-Op Callback Staff (MA/nurse) to input urgent request.  - If the patient is not currently in the dentist office, please route to the Pre-Op pool.  Request for surgical clearance:  What type of surgery is being performed? Mastectomy, in patient  When is this surgery scheduled? 09/18/2021  What type of clearance is required (medical clearance vs. Pharmacy clearance to hold med vs. Both)? Pharmacy   Are there any medications that need to be held prior to surgery and how long? Xarelto, just want guidance on how long to hold  Practice name and name of physician performing surgery? [facility], Dr. Adelina Mings   What is the office phone number? [phone]   7.   What is the office fax number? [phone]  8.   Anesthesia type (None, local, MAC, general) ? General  Selena Zobro 09/13/2021, 3:45 PM  _________________________________________________________________   (provider comments below)

## 2021-09-13 NOTE — Telephone Encounter (Signed)
Duplicate request. Will remove from pool.  Darreld Mclean, PA-C 09/13/2021 6:07 PM

## 2021-09-14 NOTE — Telephone Encounter (Signed)
   Primary Cardiologist: Carlyle Dolly, MD  Clinical pharmacist and Dr. Harl Bowie have reviewed patient's chart, medications, and past medical history.  The following recommendations have been made for  Sara Garcia    Patient with diagnosis of atrial fibrillation on Xarelto for anticoagulation.     Procedure: mastectomy Date of procedure: 09/18/21     CHA2DS2-VASc Score = 6   This indicates a 9.7% annual risk of stroke. The patient's score is based upon: CHF History: 0 HTN History: 1 Diabetes History: 0 Stroke History: 2 Vascular Disease History: 0 Age Score: 2 Gender Score: 1    CrCl 39 (with adjusted body weight) Platelet count 266   Patient with history of embolic stroke 70/1779 and DVT sometime prior to 2015 (unknown of provoked)   Would recommend 2 day hold on Xarelto prior to procedure and resume Xarelto the day after procedure.  I will route this recommendation to the requesting party via Epic fax function and remove from pre-op pool.  Please call with questions.  Sara Garcia. Ashrith Sagan NP-C    09/14/2021, 1:05 PM [address] Office [phone] Fax [phone]

## 2021-09-14 NOTE — Telephone Encounter (Signed)
[facility] called please fax clearance to them at [phone].

## 2021-09-14 NOTE — Telephone Encounter (Signed)
Medication clearance has been requested as opposed to medical clearance. From a pharm standpoint fine to hold xarelto 48hrs prior to procedure and resume day after. If medical clearance is requested they would need to send another request and she would likely need to be seen in clinic.    Carlyle Dolly MD

## 2021-09-14 NOTE — Telephone Encounter (Signed)
Dr. Harl Bowie   Please advise on upcoming surgery and recommendations. Thank you for your help.   Jossie Ng. Shyna Duignan NP-C    09/14/2021, 11:25 AM [address] Office [phone] Fax [phone]

## 2021-09-14 NOTE — Telephone Encounter (Signed)
   Amy with [facility] surgical calling back to follow up, she said they are closing at noon today and needs clearance so pt can stop it this weekend

## 2021-09-27 ENCOUNTER — Encounter (INDEPENDENT_AMBULATORY_CARE_PROVIDER_SITE_OTHER): Payer: Medicare HMO | Admitting: Nurse Practitioner

## 2021-11-08 ENCOUNTER — Other Ambulatory Visit: Payer: Self-pay | Admitting: Cardiology

## 2021-12-05 ENCOUNTER — Encounter (HOSPITAL_COMMUNITY): Payer: Self-pay | Admitting: *Deleted

## 2021-12-05 ENCOUNTER — Other Ambulatory Visit: Payer: Self-pay

## 2021-12-05 ENCOUNTER — Emergency Department (HOSPITAL_COMMUNITY): Payer: Medicare HMO

## 2021-12-05 ENCOUNTER — Inpatient Hospital Stay (HOSPITAL_COMMUNITY)
Admission: EM | Admit: 2021-12-05 | Discharge: 2021-12-08 | DRG: 444 | Disposition: A | Discharge status: Hospice | Payer: Medicare HMO | Attending: Internal Medicine | Admitting: Internal Medicine

## 2021-12-05 DIAGNOSIS — Z7401 Bed confinement status: Secondary | ICD-10-CM

## 2021-12-05 DIAGNOSIS — X58XXXA Exposure to other specified factors, initial encounter: Secondary | ICD-10-CM | POA: Diagnosis present

## 2021-12-05 DIAGNOSIS — C50919 Malignant neoplasm of unspecified site of unspecified female breast: Secondary | ICD-10-CM | POA: Diagnosis not present

## 2021-12-05 DIAGNOSIS — S5012XA Contusion of left forearm, initial encounter: Secondary | ICD-10-CM | POA: Diagnosis present

## 2021-12-05 DIAGNOSIS — R791 Abnormal coagulation profile: Secondary | ICD-10-CM | POA: Diagnosis present

## 2021-12-05 DIAGNOSIS — E872 Acidosis, unspecified: Secondary | ICD-10-CM | POA: Diagnosis present

## 2021-12-05 DIAGNOSIS — Z515 Encounter for palliative care: Secondary | ICD-10-CM | POA: Diagnosis not present

## 2021-12-05 DIAGNOSIS — Z66 Do not resuscitate: Secondary | ICD-10-CM | POA: Diagnosis present

## 2021-12-05 DIAGNOSIS — C50911 Malignant neoplasm of unspecified site of right female breast: Secondary | ICD-10-CM | POA: Diagnosis present

## 2021-12-05 DIAGNOSIS — E860 Dehydration: Secondary | ICD-10-CM | POA: Diagnosis present

## 2021-12-05 DIAGNOSIS — K810 Acute cholecystitis: Secondary | ICD-10-CM | POA: Diagnosis present

## 2021-12-05 DIAGNOSIS — N3001 Acute cystitis with hematuria: Secondary | ICD-10-CM | POA: Diagnosis not present

## 2021-12-05 DIAGNOSIS — R748 Abnormal levels of other serum enzymes: Secondary | ICD-10-CM

## 2021-12-05 DIAGNOSIS — D62 Acute posthemorrhagic anemia: Secondary | ICD-10-CM | POA: Diagnosis present

## 2021-12-05 DIAGNOSIS — K769 Liver disease, unspecified: Secondary | ICD-10-CM | POA: Diagnosis present

## 2021-12-05 DIAGNOSIS — Z7189 Other specified counseling: Secondary | ICD-10-CM | POA: Diagnosis not present

## 2021-12-05 DIAGNOSIS — K831 Obstruction of bile duct: Secondary | ICD-10-CM | POA: Diagnosis present

## 2021-12-05 DIAGNOSIS — N390 Urinary tract infection, site not specified: Secondary | ICD-10-CM | POA: Diagnosis present

## 2021-12-05 DIAGNOSIS — N10 Acute tubulo-interstitial nephritis: Secondary | ICD-10-CM | POA: Diagnosis not present

## 2021-12-05 DIAGNOSIS — Z882 Allergy status to sulfonamides status: Secondary | ICD-10-CM | POA: Diagnosis not present

## 2021-12-05 DIAGNOSIS — Z825 Family history of asthma and other chronic lower respiratory diseases: Secondary | ICD-10-CM

## 2021-12-05 DIAGNOSIS — R918 Other nonspecific abnormal finding of lung field: Secondary | ICD-10-CM | POA: Diagnosis present

## 2021-12-05 DIAGNOSIS — Z7901 Long term (current) use of anticoagulants: Secondary | ICD-10-CM

## 2021-12-05 DIAGNOSIS — Z888 Allergy status to other drugs, medicaments and biological substances status: Secondary | ICD-10-CM

## 2021-12-05 DIAGNOSIS — Z880 Allergy status to penicillin: Secondary | ICD-10-CM | POA: Diagnosis not present

## 2021-12-05 DIAGNOSIS — A419 Sepsis, unspecified organism: Secondary | ICD-10-CM | POA: Diagnosis not present

## 2021-12-05 DIAGNOSIS — N179 Acute kidney failure, unspecified: Secondary | ICD-10-CM | POA: Diagnosis present

## 2021-12-05 DIAGNOSIS — R531 Weakness: Secondary | ICD-10-CM | POA: Diagnosis present

## 2021-12-05 DIAGNOSIS — K219 Gastro-esophageal reflux disease without esophagitis: Secondary | ICD-10-CM | POA: Diagnosis present

## 2021-12-05 DIAGNOSIS — K922 Gastrointestinal hemorrhage, unspecified: Secondary | ICD-10-CM | POA: Diagnosis present

## 2021-12-05 DIAGNOSIS — Z8673 Personal history of transient ischemic attack (TIA), and cerebral infarction without residual deficits: Secondary | ICD-10-CM | POA: Diagnosis not present

## 2021-12-05 DIAGNOSIS — Z91040 Latex allergy status: Secondary | ICD-10-CM

## 2021-12-05 DIAGNOSIS — I4891 Unspecified atrial fibrillation: Secondary | ICD-10-CM | POA: Diagnosis present

## 2021-12-05 DIAGNOSIS — Z823 Family history of stroke: Secondary | ICD-10-CM

## 2021-12-05 DIAGNOSIS — I10 Essential (primary) hypertension: Secondary | ICD-10-CM | POA: Diagnosis present

## 2021-12-05 DIAGNOSIS — R17 Unspecified jaundice: Secondary | ICD-10-CM | POA: Diagnosis not present

## 2021-12-05 DIAGNOSIS — Z8249 Family history of ischemic heart disease and other diseases of the circulatory system: Secondary | ICD-10-CM

## 2021-12-05 DIAGNOSIS — I480 Paroxysmal atrial fibrillation: Secondary | ICD-10-CM | POA: Diagnosis present

## 2021-12-05 DIAGNOSIS — Z853 Personal history of malignant neoplasm of breast: Secondary | ICD-10-CM | POA: Diagnosis not present

## 2021-12-05 DIAGNOSIS — Z9011 Acquired absence of right breast and nipple: Secondary | ICD-10-CM

## 2021-12-05 DIAGNOSIS — Z20822 Contact with and (suspected) exposure to covid-19: Secondary | ICD-10-CM | POA: Diagnosis present

## 2021-12-05 LAB — CBC WITH DIFFERENTIAL/PLATELET
Abs Immature Granulocytes: 0.49 10*3/uL — ABNORMAL HIGH (ref 0.00–0.07)
Basophils Absolute: 0.1 10*3/uL (ref 0.0–0.1)
Basophils Relative: 0 %
Eosinophils Absolute: 0.1 10*3/uL (ref 0.0–0.5)
Eosinophils Relative: 0 %
HCT: 35.3 % — ABNORMAL LOW (ref 36.0–46.0)
Hemoglobin: 10.7 g/dL — ABNORMAL LOW (ref 12.0–15.0)
Immature Granulocytes: 2 %
Lymphocytes Relative: 14 %
Lymphs Abs: 3 10*3/uL (ref 0.7–4.0)
MCH: 22.9 pg — ABNORMAL LOW (ref 26.0–34.0)
MCHC: 30.3 g/dL (ref 30.0–36.0)
MCV: 75.6 fL — ABNORMAL LOW (ref 80.0–100.0)
Monocytes Absolute: 1.9 10*3/uL — ABNORMAL HIGH (ref 0.1–1.0)
Monocytes Relative: 9 %
Neutro Abs: 15 10*3/uL — ABNORMAL HIGH (ref 1.7–7.7)
Neutrophils Relative %: 75 %
Platelets: 320 10*3/uL (ref 150–400)
RBC: 4.67 MIL/uL (ref 3.87–5.11)
RDW: 32.2 % — ABNORMAL HIGH (ref 11.5–15.5)
WBC: 20.5 10*3/uL — ABNORMAL HIGH (ref 4.0–10.5)
nRBC: 0.5 % — ABNORMAL HIGH (ref 0.0–0.2)

## 2021-12-05 LAB — URINALYSIS, ROUTINE W REFLEX MICROSCOPIC
Bilirubin Urine: NEGATIVE
Glucose, UA: NEGATIVE mg/dL
Ketones, ur: NEGATIVE mg/dL
Nitrite: POSITIVE — AB
Protein, ur: NEGATIVE mg/dL
Specific Gravity, Urine: 1.015 (ref 1.005–1.030)
WBC, UA: >50 WBC/hpf — ABNORMAL HIGH (ref 0–5)
pH: 5 (ref 5.0–8.0)

## 2021-12-05 LAB — LACTIC ACID, PLASMA
Lactic Acid, Venous: 3.6 mmol/L (ref 0.5–1.9)
Lactic Acid, Venous: 3.9 mmol/L (ref 0.5–1.9)
Lactic Acid, Venous: 3.9 mmol/L (ref 0.5–1.9)

## 2021-12-05 LAB — PROTIME-INR
INR: 3.2 — ABNORMAL HIGH (ref 0.8–1.2)
Prothrombin Time: 32.7 seconds — ABNORMAL HIGH (ref 11.4–15.2)

## 2021-12-05 LAB — MAGNESIUM: Magnesium: 2.4 mg/dL (ref 1.7–2.4)

## 2021-12-05 LAB — COMPREHENSIVE METABOLIC PANEL
ALT: 45 U/L — ABNORMAL HIGH (ref 0–44)
AST: 81 U/L — ABNORMAL HIGH (ref 15–41)
Albumin: 2.7 g/dL — ABNORMAL LOW (ref 3.5–5.0)
Alkaline Phosphatase: 219 U/L — ABNORMAL HIGH (ref 38–126)
Anion gap: 12 (ref 5–15)
BUN: 37 mg/dL — ABNORMAL HIGH (ref 8–23)
CO2: 19 mmol/L — ABNORMAL LOW (ref 22–32)
Calcium: 9.8 mg/dL (ref 8.9–10.3)
Chloride: 105 mmol/L (ref 98–111)
Creatinine, Ser: 1.28 mg/dL — ABNORMAL HIGH (ref 0.44–1.00)
GFR, Estimated: 40 mL/min — ABNORMAL LOW (ref >60)
Glucose, Bld: 89 mg/dL (ref 70–99)
Potassium: 5.6 mmol/L — ABNORMAL HIGH (ref 3.5–5.1)
Sodium: 136 mmol/L (ref 135–145)
Total Bilirubin: 6.8 mg/dL — ABNORMAL HIGH (ref 0.3–1.2)
Total Protein: 6.8 g/dL (ref 6.5–8.1)

## 2021-12-05 LAB — RESP PANEL BY RT-PCR (FLU A&B, COVID) ARPGX2
Influenza A by PCR: NEGATIVE
Influenza B by PCR: NEGATIVE
SARS Coronavirus 2 by RT PCR: NEGATIVE

## 2021-12-05 LAB — TROPONIN I (HIGH SENSITIVITY)
Troponin I (High Sensitivity): 15 ng/L (ref <18)
Troponin I (High Sensitivity): 13 ng/L (ref <18)

## 2021-12-05 MED ORDER — METRONIDAZOLE 500 MG/100ML IV SOLN
500.0000 mg | Freq: Once | INTRAVENOUS | Status: AC
  Administered 2021-12-05: 500 mg via INTRAVENOUS
  Filled 2021-12-05: qty 100

## 2021-12-05 MED ORDER — ONDANSETRON HCL 4 MG PO TABS: 4.0000 mg | ORAL_TABLET | Freq: Four times a day (QID) | ORAL | Status: DC | PRN

## 2021-12-05 MED ORDER — DULOXETINE HCL 60 MG PO CPEP
60.0000 mg | ORAL_CAPSULE | Freq: Two times a day (BID) | ORAL | Status: DC
Start: 2021-12-06 — End: 2021-12-08
  Administered 2021-12-06 – 2021-12-07 (×4): 60 mg via ORAL
  Filled 2021-12-05 (×5): qty 1

## 2021-12-05 MED ORDER — POLYETHYLENE GLYCOL 3350 17 G PO PACK: 17.0000 g | PACK | Freq: Every day | ORAL | Status: DC | PRN

## 2021-12-05 MED ORDER — VANCOMYCIN HCL IN DEXTROSE 1-5 GM/200ML-% IV SOLN
1000.0000 mg | Freq: Once | INTRAVENOUS | Status: DC
  Filled 2021-12-05: qty 200

## 2021-12-05 MED ORDER — CEFTRIAXONE SODIUM 1 G IJ SOLR
1.0000 g | INTRAMUSCULAR | Status: DC
  Filled 2021-12-05: qty 10

## 2021-12-05 MED ORDER — LACTATED RINGERS IV SOLN: INTRAVENOUS | Status: DC

## 2021-12-05 MED ORDER — RIVAROXABAN 15 MG PO TABS: 15.0000 mg | ORAL_TABLET | Freq: Every day | ORAL | Status: DC

## 2021-12-05 MED ORDER — LACTATED RINGERS IV BOLUS (SEPSIS)
1000.0000 mL | Freq: Once | INTRAVENOUS | Status: AC
  Administered 2021-12-05: 1000 mL via INTRAVENOUS

## 2021-12-05 MED ORDER — ACETAMINOPHEN 650 MG RE SUPP: 650.0000 mg | Freq: Four times a day (QID) | RECTAL | Status: DC | PRN

## 2021-12-05 MED ORDER — METOPROLOL TARTRATE 25 MG PO TABS
25.0000 mg | ORAL_TABLET | Freq: Two times a day (BID) | ORAL | Status: DC
  Administered 2021-12-06: 25 mg via ORAL
  Filled 2021-12-05: qty 1

## 2021-12-05 MED ORDER — VANCOMYCIN HCL 1250 MG/250ML IV SOLN
1250.0000 mg | Freq: Once | INTRAVENOUS | Status: AC
  Administered 2021-12-05: 1250 mg via INTRAVENOUS
  Filled 2021-12-05: qty 250

## 2021-12-05 MED ORDER — ONDANSETRON HCL 4 MG/2ML IJ SOLN
4.0000 mg | Freq: Four times a day (QID) | INTRAMUSCULAR | Status: DC | PRN
  Administered 2021-12-06: 4 mg via INTRAVENOUS
  Filled 2021-12-05: qty 2

## 2021-12-05 MED ORDER — LETROZOLE 2.5 MG PO TABS
2.5000 mg | ORAL_TABLET | Freq: Every day | ORAL | Status: DC
Start: 2021-12-06 — End: 2021-12-08
  Administered 2021-12-06 – 2021-12-08 (×3): 2.5 mg via ORAL
  Filled 2021-12-05 (×5): qty 1

## 2021-12-05 MED ORDER — ACETAMINOPHEN 325 MG PO TABS
650.0000 mg | ORAL_TABLET | Freq: Four times a day (QID) | ORAL | Status: DC | PRN
  Filled 2021-12-05: qty 2

## 2021-12-05 MED ORDER — VANCOMYCIN VARIABLE DOSE PER UNSTABLE RENAL FUNCTION (PHARMACIST DOSING): Status: DC

## 2021-12-05 MED ORDER — SODIUM CHLORIDE 0.9 % IV SOLN: 2.0000 g | INTRAVENOUS | Status: DC

## 2021-12-05 MED ORDER — DILTIAZEM HCL ER 60 MG PO CP12
60.0000 mg | ORAL_CAPSULE | Freq: Two times a day (BID) | ORAL | Status: DC
Start: 2021-12-06 — End: 2021-12-06
  Administered 2021-12-06: 60 mg via ORAL
  Filled 2021-12-05 (×4): qty 1

## 2021-12-05 MED ORDER — DEXTROSE-NACL 5-0.9 % IV SOLN: INTRAVENOUS | Status: DC

## 2021-12-05 MED ORDER — PANTOPRAZOLE SODIUM 40 MG PO TBEC
40.0000 mg | DELAYED_RELEASE_TABLET | Freq: Every day | ORAL | Status: DC
  Administered 2021-12-06: 40 mg via ORAL
  Filled 2021-12-05: qty 1

## 2021-12-05 MED ORDER — SODIUM CHLORIDE 0.9 % IV SOLN
2.0000 g | Freq: Once | INTRAVENOUS | Status: AC
  Administered 2021-12-05: 2 g via INTRAVENOUS
  Filled 2021-12-05: qty 2

## 2021-12-05 NOTE — Progress Notes (Addendum)
Pharmacy Antibiotic Note  Sara Garcia is a 88 y.o. female admitted on 12/05/2021 with sepsis of unknown source. Pharmacy has been consulted for cefepime and vancomycin dosing for 7-day course.  WBC 20.5, afebrile; Scr 1.28 (per values in Epic, baseline Scr appears to be ~0.80-0.84), CrCl 24.9 ml/min  Plan: Cefepime 2 gm IV Q 24 hrs Vancomyc in 1250 mg IV X 1; pt's Scr is significantly higher than prior values in Epic, so unsure of current Scr trend; will dose vancomycin by levels at least initially; check random vancomycin level, SCr  ~16 hrs after initial dose given (or earlier if Scr significantly lower on AM labs) to evaluate vancomycin clearance and renal function trend Monitor WBC, temp, clinical improvement, renal function, cultures, vancomycin levels  Patient Data: Height: 5\' 2"  (157.5 cm) Weight: 62.3 kg (137 lb 6.4 oz) IBW/kg (Calculated) : 50.1  Temp (24hrs), Avg:98.3 F (36.8 C), Min:98.3 F (36.8 C), Max:98.3 F (36.8 C)  Recent Labs  Lab 12/05/21 1736 12/05/21 1917  WBC 20.5*  --   CREATININE 1.28*  --   LATICACIDVEN 3.6* 3.9*    Estimated Creatinine Clearance: 24.9 mL/min (A) (by C-G formula based on SCr of 1.28 mg/dL (H)).    Allergies  Allergen Reactions   Latex Itching and Rash   Adhesive [Tape] Other (See Comments)    Tears skin   Betadine [Povidone Iodine]     blisters   Ciprofloxacin Nausea And Vomiting   Codeine Nausea And Vomiting    Upsets stomach, nightmares   Penicillins Hives    Has patient had a PCN reaction causing immediate rash, facial/tongue/throat swelling, SOB or lightheadedness with hypotension: yes Has patient had a PCN reaction causing severe rash involving mucus membranes or skin necrosis: yes Has patient had a PCN reaction that required hospitalization: no Has patient had a PCN reaction occurring within the last 10 years: no If all of the above answers are "NO", then may proceed with Cephalosporin use.    Sulfa Antibiotics Hives    Tramadol    Wellbutrin [Bupropion]     Reaction unknown   Aloe Rash   Antimicrobials this admission: Cefepime 2/22 >> Metronidazole 2/22  Vancomycin 2/22 >>  Microbiology results: 2/22 COVID, flu A, flu B: negative 2/22 Bld cx X 2: pending  Thank you for allowing pharmacy to be a part of this patients care.  Gillermina Hu, PharmD, BCPS, [facility] Clinical Pharmacist 12/05/2021 8:04 PM

## 2021-12-05 NOTE — Assessment & Plan Note (Signed)
Creatinine 1.28, baseline 0.7-0.8.  Likely prerenal from poor oral intake. -Hydrate.

## 2021-12-05 NOTE — Assessment & Plan Note (Addendum)
Likely secondary to poor oral intake, dehydration and UTI.  No focal deficits.  She appears very dehydrated.  Significant lactic acidosis 3.6 > 3.9 after 1 L bolus.  Lactic acidosis likely secondary to dehydration and UTI. -Hydrate, antibiotics.

## 2021-12-05 NOTE — Assessment & Plan Note (Addendum)
ALP 219, ALT 45, AST 81, total bilirubin 6.8.  INR 3.2.  Abdominal exam is benign, reports dry heaving and vomiting over the past month. -With worsening renal function, will obtain right upper quadrant ultrasound in the morning. -CMP, INR in the morning.

## 2021-12-05 NOTE — Assessment & Plan Note (Signed)
Currently in sinus rhythm.  On chronic anticoagulation with warfarin.  Rate controlled on Cardizem and metoprolol.  She is on Xarelto.  But INR is elevated at 3.2. -Resume Xarelto, Cardizem, metoprolol.

## 2021-12-05 NOTE — Assessment & Plan Note (Addendum)
Reports urinary frequency.  UA suggestive of UTI with positive leukocytes, nitrites greater than 50 WBCs and many bacteria.  Leukocytosis of 20.5.  Heart rate 66-85, but she is on rate limiting medications.  Respiratory 13-22.  Lactic acidosis 3.6-3.9.  No recent urine cultures on file. -Continue with IV ceftriaxone 1 g daily - Follow-up blood and urine cultures -2 L bolus given, continue D5 N/s 100cc/hr x 15hrs -Trend lactic acid

## 2021-12-05 NOTE — Assessment & Plan Note (Signed)
Stable.  Resume Cardizem, metoprolol. -

## 2021-12-05 NOTE — H&P (Addendum)
History and Physical    Sara Garcia QHU:[id number] DOB: 15-May-1928 DOA: 12/05/2021  PCP: Doree Albee, MD (Inactive)   Patient coming from: Home  I have personally briefly reviewed patient's old medical records in [place]  Chief Complaint: weakness  HPI: Sara Garcia is a 88 y.o. female with medical history significant for CVA, atrial fibrillation, hypertension, breast cancer. Patient was brought to the ED from home with reports of generalized weakness, nausea with dry heaving.  Nausea with dry heaving has been going on almost daily for about a month.  With poor oral intake over the past few days. After iron infusion- 7/17, developed or generalized weakness. At the time of my evaluation patient is awake alert oriented to person place and time.  [name] is at bedside, significant memory problems.  Patient endorses urinary frequency, she denies dysuria, she has a chronic unchanged cough, no difficulty breathing.  No fevers no chills.  ED Course: Temperature 98.3.  Heart rate 60s to 80s.  Respiratory rate 13-22.  Blood pressure systolic 465-681.  WBC 20.5.  Mild elevation in liver enzymes.  Lactic acidosis 3.6 > 3.9 after 1 L bolus. Troponin 15 >.  Magnesium 2.4.  INR 2.2. Broad-spectrum antibiotics IV Vanco cefepime and metronidazole started.  Blood and urine cultures obtained.  COVID test negative.  Review of Systems: As per HPI all other systems reviewed and negative.  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 Inflammatory polyp   ESOPHAGEAL DILATION     unknown year per patient.   ESOPHAGEAL DILATION N/A 02/07/2015   RMR: Normal esophagus status post maloney dilation. Small hiatal hernia.    ESOPHAGOGASTRODUODENOSCOPY     unknown year per patient   ESOPHAGOGASTRODUODENOSCOPY N/A 02/07/2015   RMR: Normal esophagus  status post  Maloney dilation. Small hiatal hernia   FALSE ANEURYSM REPAIR Left 09/23/2017   Procedure: REPAIR FALSE ANEURYSM COMMON LEFT FEMORAL ARTERY WITH OSTEOTOMY OF BONE FRAGEMENT;  Surgeon: Angelia Mould, MD;  Location: [facility];  Service: Vascular;  Laterality: Left;   FEMUR IM NAIL Left 06/11/2017   Procedure: INTRAMEDULLARY (IM) NAIL FEMORAL;  Surgeon: Rod Can, MD;  Location: [facility];  Service: Orthopedics;  Laterality: Left;   LEG SURGERY Left    MASTECTOMY Right 09/2021   NASAL SEPTUM SURGERY     TONSILLECTOMY     WRIST FRACTURE SURGERY       reports that she has never smoked. She has never used smokeless tobacco. She reports that she does not drink alcohol and does not use drugs.  Allergies  Allergen Reactions   Latex Itching and Rash   Adhesive [Tape] Other (See Comments)  Tears skin   Betadine [Povidone Iodine]     blisters   Ciprofloxacin Nausea And Vomiting   Codeine Nausea And Vomiting    Upsets stomach, nightmares   Penicillins Hives    Has patient had a PCN reaction causing immediate rash, facial/tongue/throat swelling, SOB or lightheadedness with hypotension: yes Has patient had a PCN reaction causing severe rash involving mucus membranes or skin necrosis: yes Has patient had a PCN reaction that required hospitalization: no Has patient had a PCN reaction occurring within the last 10 years:  no If all of the above answers are "NO", then may proceed with Cephalosporin use.    Sulfa Antibiotics Hives   Tramadol    Wellbutrin [Bupropion]     Reaction unknown   Aloe Rash    Family History  Problem Relation Age of Onset   Heart disease Mother    Stroke Mother    Emphysema Father    COPD Sister    Liver disease Brother    Colon polyps Neg Hx    Colon cancer Neg Hx     Prior to Admission medications   Medication Sig Start Date End Date Taking? Authorizing Provider  albuterol (VENTOLIN HFA) 108 (90 Base) MCG/ACT inhaler Inhale 1-2 puffs into the lungs every 6 (six) hours as needed for shortness of breath. 11/16/21  Yes [provider]  cholecalciferol (VITAMIN D) 1000 units tablet Take 3,000 Units by mouth 2 (two) times daily.   Yes [provider]  [facility] VITAMIN B12 1000 MCG tablet TAKE 1 TABLET BY MOUTH EVERY DAY 02/16/21  Yes Gosrani, Nimish C, MD  diclofenac (VOLTAREN) 0.1 % ophthalmic solution 4 (four) times daily.   Yes [provider]  diltiazem (CARDIZEM SR) 60 MG 12 hr capsule TAKE 1 CAPSULE (60 MG TOTAL) BY MOUTH IN THE MORNING, AT NOON, AND AT BEDTIME. Patient taking differently: Take 60 mg by mouth 2 (two) times daily. 11/08/21  Yes Arnoldo Lenis, MD  DULoxetine (CYMBALTA) 60 MG capsule TAKE 1 CAPSULE BY MOUTH TWICE A DAY 04/18/21  Yes Ailene Ards, NP  KLOR-CON M20 20 MEQ tablet TAKE 1 TABLET BY MOUTH TWICE A DAY 04/06/21  Yes Ailene Ards, NP  letrozole [facility]) 2.5 MG tablet Take 2.5 mg by mouth daily. 12/04/21  Yes [provider]  metoprolol tartrate (LOPRESSOR) 25 MG tablet TAKE 1 TABLET BY MOUTH 2 TIMES DAILY 02/19/21  Yes Branch, Alphonse Guild, MD  NON FORMULARY Take 2 tablets by mouth 2 (two) times daily at 10 am and 4 pm. CBD gummies; 2 chews 2 times a day.   Yes [provider]  NP THYROID 60 MG tablet TAKE 1 TABLET BY MOUTH EVERY DAY 05/02/21  Yes Gosrani, Nimish C, MD  omeprazole (PRILOSEC) 20 MG capsule Take by  mouth.   Yes [provider]  polyethylene glycol powder (GLYCOLAX/MIRALAX) 17 GM/SCOOP powder Take 17 g by mouth daily. 01/27/20  Yes Doree Albee, MD  Rivaroxaban (XARELTO) 15 MG TABS tablet Take 1 tablet (15 mg total) by mouth daily with supper. 05/08/21  Yes Gosrani, Nimish C, MD  Turmeric (QC TUMERIC COMPLEX PO) Take 1,000 mg by mouth daily.   Yes [provider]  esomeprazole (NEXIUM) 20 MG capsule TAKE 1 CAPSULE BY MOUTH ONCE DAILY BEFORE BREAKFAST Patient not taking: Reported on 12/05/2021 01/27/20   Doree Albee, MD  Fluocinolone Acetonide 0.01 % SHAM Apply 2 tablespoons of shampoo to the scalp daily as needed for up to 2  weeks; leave shampoo on scalp for 5 minutes then rinse. Patient not taking: Reported on 12/05/2021 01/27/20   Doree Albee, MD  Fluticasone-Salmeterol (ADVAIR) 250-50 MCG/DOSE AEPB Inhale 1 puff into the lungs daily. Patient not taking: Reported on 12/05/2021 01/27/20   Doree Albee, MD  furosemide (LASIX) 20 MG tablet TAKE 1 TABLET BY MOUTH TWICE A DAY Patient not taking: Reported on 12/05/2021 02/26/21   Arnoldo Lenis, MD    Physical Exam: Vitals:   12/05/21 1830 12/05/21 1900 12/05/21 1930 12/05/21 2000  BP: 107/77 (!) 121/47 (!) 115/94 129/68  Pulse: 69 66 82 85  Resp: 13 (!) 21 17 (!) 21  Temp:      TempSrc:      SpO2: 98% 98% 98% 98%  Weight:      Height:        Constitutional: NAD, calm, comfortable Vitals:   12/05/21 1830 12/05/21 1900 12/05/21 1930 12/05/21 2000  BP: 107/77 (!) 121/47 (!) 115/94 129/68  Pulse: 69 66 82 85  Resp: 13 (!) 21 17 (!) 21  Temp:      TempSrc:      SpO2: 98% 98% 98% 98%  Weight:      Height:       Eyes: PERRL, lids and conjunctivae normal ENMT: Mucous membranes are very dry.  Neck: normal, supple, no masses, no thyromegaly Respiratory: clear to auscultation bilaterally, no wheezing, no crackles. Normal respiratory effort. No accessory muscle use.  Cardiovascular: Regular rate and  rhythm, no murmurs / rubs / gallops. No extremity edema.  Lower extremities warm, but toes and fingers are cold, purplish discoloration to toes of right feet, but strong DP pulse present. (Daughter reports discoloration was much worse earlier today, involving most of the feet) Abdomen: no tenderness, no masses palpated. No hepatosplenomegaly. Bowel sounds positive.  Musculoskeletal: no clubbing / cyanosis. No joint deformity upper and lower extremities.  Skin: no rashes, lesions, ulcers. No induration Neurologic: Baseline dysarthria, speech is understandable, no facial asymmetry.  Moving all extremities spontaneously. Psychiatric: Normal judgment and insight. Alert and oriented x 3. Normal mood.   Labs on Admission: I have personally reviewed following labs and imaging studies  CBC: Recent Labs  Lab 12/05/21 1736  WBC 20.5*  NEUTROABS 15.0*  HGB 10.7*  HCT 35.3*  MCV 75.6*  PLT 465   Basic Metabolic Panel: Recent Labs  Lab 12/05/21 1736  NA 136  K 5.6*  CL 105  CO2 19*  GLUCOSE 89  BUN 37*  CREATININE 1.28*  CALCIUM 9.8  MG 2.4   GFR: Estimated Creatinine Clearance: 24.9 mL/min (A) (by C-G formula based on SCr of 1.28 mg/dL (H)). Liver Function Tests: Recent Labs  Lab 12/05/21 1736  AST 81*  ALT 45*  ALKPHOS 219*  BILITOT 6.8*  PROT 6.8  ALBUMIN 2.7*   Coagulation Profile: Recent Labs  Lab 12/05/21 1736  INR 3.2*   Urine analysis:    Component Value Date/Time   COLORURINE AMBER (A) 12/05/2021 1955   APPEARANCEUR CLOUDY (A) 12/05/2021 1955   LABSPEC 1.015 12/05/2021 1955   PHURINE 5.0 12/05/2021 1955   GLUCOSEU NEGATIVE 12/05/2021 1955   HGBUR SMALL (A) 12/05/2021 1955   BILIRUBINUR NEGATIVE 12/05/2021 1955   KETONESUR NEGATIVE 12/05/2021 1955   PROTEINUR NEGATIVE 12/05/2021 1955   UROBILINOGEN 0.2 10/20/2013 1434   NITRITE POSITIVE (A) 12/05/2021 1955   LEUKOCYTESUR LARGE (A) 12/05/2021 1955    Radiological Exams on Admission: DG Chest Port 1  View  Result  Date: 12/05/2021 CLINICAL DATA:  A 88 year old female presents with questionable sepsis. EXAM: PORTABLE CHEST 1 VIEW COMPARISON:  Comparison is made with chest x-ray from December of 2018 and chest CT from 2017. FINDINGS: The images rotated to the RIGHT. Accounting for this cardiomediastinal contours and hilar structures are stable. Lungs are clear aside from nodular changes in the area of the RIGHT infrahilar region/middle lobe just over the RIGHT hemidiaphragm. In the interval there been changes of RIGHT mastectomy. No lobar consolidation or effusion. Signs of prior rib fracture along the posterior LEFT chest, upper chest. Osteopenia. On limited assessment there is no acute skeletal process. IMPRESSION: 1. Persistent nodule and or mass in the RIGHT infrahilar region is suggested on the current radiograph. This was noted on prior imaging studies dating back to 2018 with no recent imaging for comparison. Correlate with any prior imaging or history to determine whether this has been addressed. 2. RIGHT mastectomy changes with surgical clips over the RIGHT chest and breast. 3. No acute cardiopulmonary disease. Electronically Signed   By: Zetta Bills M.D.   On: 12/05/2021 17:58    EKG: Independently reviewed.  Sinus rhythm rate 72.  QTc 432.  No significant change from prior.  Assessment/Plan Principal Problem:   Generalized weakness Active Problems:   Essential hypertension   Atrial fibrillation (HCC)   Chronic anticoagulation   UTI (urinary tract infection)   Elevated liver enzymes   AKI (acute kidney injury) (HCC)   Assessment and Plan: * Generalized weakness Likely secondary to poor oral intake, dehydration and UTI.  No focal deficits.  She appears very dehydrated.  Significant lactic acidosis 3.6 > 3.9 after 1 L bolus.  Lactic acidosis likely secondary to dehydration and UTI. -Hydrate, antibiotics.  AKI (acute kidney injury) ([place])- (present on admission) Creatinine 1.28,  baseline 0.7-0.8.  Likely prerenal from poor oral intake. -Hydrate.   Elevated liver enzymes ALP 219, ALT 45, AST 81, total bilirubin 6.8.  INR 3.2.  Abdominal exam is benign, reports dry heaving and vomiting over the past month. -With worsening renal function, will obtain right upper quadrant ultrasound in the morning. -CMP, INR in the morning.   UTI (urinary tract infection)- (present on admission) Reports urinary frequency.  UA suggestive of UTI with positive leukocytes, nitrites greater than 50 WBCs and many bacteria.  Leukocytosis of 20.5.  Heart rate 66-85, but she is on rate limiting medications.  Respiratory 13-22.  Lactic acidosis 3.6-3.9.  No recent urine cultures on file. -Continue with IV ceftriaxone 1 g daily - Follow-up blood and urine cultures -2 L bolus given, continue D5 N/s 100cc/hr x 15hrs -Trend lactic acid  Atrial fibrillation ([place])- (present on admission) Currently in sinus rhythm.  On chronic anticoagulation with warfarin.  Rate controlled on Cardizem and metoprolol.  She is on Xarelto.  But INR is elevated at 3.2. -Resume Xarelto, Cardizem, metoprolol.  Essential hypertension- (present on admission) Stable.  Resume Cardizem, metoprolol. -    DVT prophylaxis: Xarelto Code Status: DNR-confirmed with daughter [name] and patient at bedside.(Family would like to have a universal DNR form on discharge) Family Communication: Daughter [name]- at bedside this primary Media planner.  Patient does not have designated HCPOA. Disposition Plan: ~ 2 days Consults called: None Admission status: Inpt tele I certify that at the point of admission it is my clinical judgment that the patient will require inpatient hospital care spanning beyond 2 midnights from the point of admission due to high intensity of service, high risk for further deterioration  and high frequency of surveillance required.    Bethena Roys MD [facility]  12/05/2021, 10:08 PM

## 2021-12-05 NOTE — Progress Notes (Signed)
Sepsis tracking by eLINK 

## 2021-12-05 NOTE — ED Triage Notes (Signed)
Pt brought in by [facility] from home with c/o weakness and nausea since iron infusion last week. Pt currently not nauseated, but reports weakness. BP 121/74, HR 75, O2 sat 95% RA per EMS.

## 2021-12-05 NOTE — ED Provider Notes (Signed)
[facility] EMERGENCY DEPARTMENT Provider Note   CSN: [id number] Arrival date & time: 12/05/21  1626     History  Chief Complaint  Patient presents with   Weakness    Sara Garcia is a 88 y.o. female.   Weakness Associated symptoms: arthralgias (Chronic)   Patient presents for generalized weakness.  Per chart review, medical history includes HTN, atrial fibrillation, GERD, iron deficiency anemia, CVA, arthritis.  Late last week, patient underwent an iron infusion.  This was the first time that she has had an iron infusion.  Since that time, she has had progressive generalized weakness.  She has had poor p.o. intake, nausea, and dry heaving.  These are chronic symptoms that she has but they have worsened over the weekend.  Patient has been nonambulatory for the past 4 years.  At baseline, she is able to transfer from bed to commode or wheelchair.  She is usually able to propel herself in her wheelchair.  She has had difficulty with this lately due to chronic right shoulder pain.  She has been able to take her medications.  Patient presents to the ED with her daughter at bedside due to progressive weakness.  Currently, patient denies any new areas of pain.    Home Medications Prior to Admission medications   Medication Sig Start Date End Date Taking? Authorizing Provider  albuterol (VENTOLIN HFA) 108 (90 Base) MCG/ACT inhaler Inhale 1-2 puffs into the lungs every 6 (six) hours as needed for shortness of breath. 11/16/21  Yes [provider]  cholecalciferol (VITAMIN D) 1000 units tablet Take 3,000 Units by mouth 2 (two) times daily.   Yes [provider]  [facility] VITAMIN B12 1000 MCG tablet TAKE 1 TABLET BY MOUTH EVERY DAY 02/16/21  Yes Gosrani, Nimish C, MD  diclofenac (VOLTAREN) 0.1 % ophthalmic solution 4 (four) times daily.   Yes [provider]  diltiazem (CARDIZEM SR) 60 MG 12 hr capsule TAKE 1 CAPSULE (60 MG TOTAL) BY MOUTH IN THE MORNING, AT NOON, AND AT  BEDTIME. Patient taking differently: Take 60 mg by mouth 2 (two) times daily. 11/08/21  Yes Arnoldo Lenis, MD  DULoxetine (CYMBALTA) 60 MG capsule TAKE 1 CAPSULE BY MOUTH TWICE A DAY 04/18/21  Yes Ailene Ards, NP  KLOR-CON M20 20 MEQ tablet TAKE 1 TABLET BY MOUTH TWICE A DAY 04/06/21  Yes Ailene Ards, NP  letrozole [facility]) 2.5 MG tablet Take 2.5 mg by mouth daily. 12/04/21  Yes [provider]  metoprolol tartrate (LOPRESSOR) 25 MG tablet TAKE 1 TABLET BY MOUTH 2 TIMES DAILY 02/19/21  Yes Branch, Alphonse Guild, MD  NON FORMULARY Take 2 tablets by mouth 2 (two) times daily at 10 am and 4 pm. CBD gummies; 2 chews 2 times a day.   Yes [provider]  NP THYROID 60 MG tablet TAKE 1 TABLET BY MOUTH EVERY DAY 05/02/21  Yes Gosrani, Nimish C, MD  omeprazole (PRILOSEC) 20 MG capsule Take by mouth.   Yes [provider]  polyethylene glycol powder (GLYCOLAX/MIRALAX) 17 GM/SCOOP powder Take 17 g by mouth daily. 01/27/20  Yes Doree Albee, MD  Rivaroxaban (XARELTO) 15 MG TABS tablet Take 1 tablet (15 mg total) by mouth daily with supper. 05/08/21  Yes Gosrani, Nimish C, MD  Turmeric (QC TUMERIC COMPLEX PO) Take 1,000 mg by mouth daily.   Yes [provider]  esomeprazole (NEXIUM) 20 MG capsule TAKE 1 CAPSULE BY MOUTH ONCE DAILY BEFORE BREAKFAST Patient not taking:  Reported on 12/05/2021 01/27/20   Doree Albee, MD  Fluocinolone Acetonide 0.01 % SHAM Apply 2 tablespoons of shampoo to the scalp daily as needed for up to 2 weeks; leave shampoo on scalp for 5 minutes then rinse. Patient not taking: Reported on 12/05/2021 01/27/20   Doree Albee, MD  Fluticasone-Salmeterol (ADVAIR) 250-50 MCG/DOSE AEPB Inhale 1 puff into the lungs daily. Patient not taking: Reported on 12/05/2021 01/27/20   Doree Albee, MD  furosemide (LASIX) 20 MG tablet TAKE 1 TABLET BY MOUTH TWICE A DAY Patient not taking: Reported on 12/05/2021 02/26/21   Arnoldo Lenis, MD      Allergies     Latex, Adhesive [tape], Betadine [povidone iodine], Ciprofloxacin, Codeine, Penicillins, Sulfa antibiotics, Tramadol, Wellbutrin [bupropion], and Aloe    Review of Systems   Review of Systems  Constitutional:  Positive for activity change, appetite change and fatigue.  HENT:  Positive for trouble swallowing (Baseline).   Musculoskeletal:  Positive for arthralgias (Chronic).  Neurological:  Positive for weakness (Generalized).  All other systems reviewed and are negative.  Physical Exam Updated Vital Signs BP (!) 126/47 (BP Location: Left Arm)    Pulse 98    Temp 98.7 F (37.1 C) (Oral)    Resp 20    Ht 5\' 2"  (1.575 m)    Wt 62.3 kg    SpO2 95%    BMI 25.13 kg/m  Physical Exam Vitals and nursing note reviewed.  Constitutional:      General: She is not in acute distress.    Appearance: She is well-developed. She is ill-appearing. She is not toxic-appearing or diaphoretic.  HENT:     Head: Normocephalic and atraumatic.     Right Ear: External ear normal.     Left Ear: External ear normal.     Nose: Nose normal.     Mouth/Throat:     Mouth: Mucous membranes are dry.     Pharynx: Oropharynx is clear.  Eyes:     Extraocular Movements: Extraocular movements intact.     Conjunctiva/sclera: Conjunctivae normal.  Cardiovascular:     Rate and Rhythm: Normal rate and regular rhythm.     Pulses: Normal pulses.     Heart sounds: No murmur heard. Pulmonary:     Effort: Pulmonary effort is normal. No respiratory distress.     Breath sounds: Normal breath sounds. No wheezing or rales.  Chest:     Chest wall: No tenderness.  Abdominal:     General: Abdomen is flat.     Palpations: Abdomen is soft.     Tenderness: There is no abdominal tenderness.  Musculoskeletal:        General: No swelling. Normal range of motion.     Cervical back: Normal range of motion and neck supple.     Comments: Purple discoloration to toes of bilateral feet.  Nontender.  DP and PT pulses intact.  Skin:     General: Skin is warm and dry.     Capillary Refill: Capillary refill takes less than 2 seconds.     Coloration: Skin is pale. Skin is not jaundiced.     Findings: Bruising (Left forearm, site of recent iron infusion) present.  Neurological:     General: No focal deficit present.     Mental Status: She is alert and oriented to person, place, and time.     Cranial Nerves: No cranial nerve deficit.     Sensory: No sensory deficit.     Motor:  No weakness.  Psychiatric:        Mood and Affect: Mood normal.        Behavior: Behavior normal.        Thought Content: Thought content normal.        Judgment: Judgment normal.    ED Results / Procedures / Treatments   Labs (all labs ordered are listed, but only abnormal results are displayed) Labs Reviewed  LACTIC ACID, PLASMA - Abnormal; Notable for the following components:      Result Value   Lactic Acid, Venous 3.6 (*)    All other components within normal limits  LACTIC ACID, PLASMA - Abnormal; Notable for the following components:   Lactic Acid, Venous 3.9 (*)    All other components within normal limits  COMPREHENSIVE METABOLIC PANEL - Abnormal; Notable for the following components:   Potassium 5.6 (*)    CO2 19 (*)    BUN 37 (*)    Creatinine, Ser 1.28 (*)    Albumin 2.7 (*)    AST 81 (*)    ALT 45 (*)    Alkaline Phosphatase 219 (*)    Total Bilirubin 6.8 (*)    GFR, Estimated 40 (*)    All other components within normal limits  CBC WITH DIFFERENTIAL/PLATELET - Abnormal; Notable for the following components:   WBC 20.5 (*)    Hemoglobin 10.7 (*)    HCT 35.3 (*)    MCV 75.6 (*)    MCH 22.9 (*)    RDW 32.2 (*)    nRBC 0.5 (*)    Neutro Abs 15.0 (*)    Monocytes Absolute 1.9 (*)    Abs Immature Granulocytes 0.49 (*)    All other components within normal limits  PROTIME-INR - Abnormal; Notable for the following components:   Prothrombin Time 32.7 (*)    INR 3.2 (*)    All other components within normal limits   URINALYSIS, ROUTINE W REFLEX MICROSCOPIC - Abnormal; Notable for the following components:   Color, Urine AMBER (*)    APPearance CLOUDY (*)    Hgb urine dipstick SMALL (*)    Nitrite POSITIVE (*)    Leukocytes,Ua LARGE (*)    WBC, UA >50 (*)    Bacteria, UA MANY (*)    All other components within normal limits  LACTIC ACID, PLASMA - Abnormal; Notable for the following components:   Lactic Acid, Venous 3.9 (*)    All other components within normal limits  LACTIC ACID, PLASMA - Abnormal; Notable for the following components:   Lactic Acid, Venous 3.3 (*)    All other components within normal limits  CULTURE, BLOOD (ROUTINE X 2)  CULTURE, BLOOD (ROUTINE X 2)  RESP PANEL BY RT-PCR (FLU A&B, COVID) ARPGX2  URINE CULTURE  MAGNESIUM  PROTIME-INR  CBC  COMPREHENSIVE METABOLIC PANEL  TROPONIN I (HIGH SENSITIVITY)  TROPONIN I (HIGH SENSITIVITY)    EKG EKG Interpretation  Date/Time:  Wednesday December 05 2021 16:38:16 EST Ventricular Rate:  72 PR Interval:  134 QRS Duration: 109 QT Interval:  394 QTC Calculation: 432 R Axis:   32 Text Interpretation: Sinus arrhythmia Borderline low voltage, extremity leads Confirmed by Godfrey Pick [phone]) on 12/05/2021 6:14:24 PM  Radiology DG Chest Port 1 View  Result Date: 12/05/2021 CLINICAL DATA:  A 88 year old female presents with questionable sepsis. EXAM: PORTABLE CHEST 1 VIEW COMPARISON:  Comparison is made with chest x-ray from December of 2018 and chest CT from 2017. FINDINGS: The images rotated  to the RIGHT. Accounting for this cardiomediastinal contours and hilar structures are stable. Lungs are clear aside from nodular changes in the area of the RIGHT infrahilar region/middle lobe just over the RIGHT hemidiaphragm. In the interval there been changes of RIGHT mastectomy. No lobar consolidation or effusion. Signs of prior rib fracture along the posterior LEFT chest, upper chest. Osteopenia. On limited assessment there is no acute skeletal  process. IMPRESSION: 1. Persistent nodule and or mass in the RIGHT infrahilar region is suggested on the current radiograph. This was noted on prior imaging studies dating back to 2018 with no recent imaging for comparison. Correlate with any prior imaging or history to determine whether this has been addressed. 2. RIGHT mastectomy changes with surgical clips over the RIGHT chest and breast. 3. No acute cardiopulmonary disease. Electronically Signed   By: Zetta Bills M.D.   On: 12/05/2021 17:58    Procedures Procedures    Medications Ordered in ED Medications  cefTRIAXone (ROCEPHIN) 1 g in sodium chloride 0.9 % 100 mL IVPB (has no administration in time range)  diltiazem (CARDIZEM SR) 12 hr capsule 60 mg (has no administration in time range)  DULoxetine (CYMBALTA) DR capsule 60 mg (has no administration in time range)  letrozole [facility]) tablet 2.5 mg (has no administration in time range)  metoprolol tartrate (LOPRESSOR) tablet 25 mg (has no administration in time range)  pantoprazole (PROTONIX) EC tablet 40 mg (has no administration in time range)  Rivaroxaban (XARELTO) tablet 15 mg (has no administration in time range)  dextrose 5 %-0.9 % sodium chloride infusion ( Intravenous New Bag/Given 12/06/21 0109)  acetaminophen (TYLENOL) tablet 650 mg (has no administration in time range)    Or  acetaminophen (TYLENOL) suppository 650 mg (has no administration in time range)  ondansetron (ZOFRAN) tablet 4 mg (has no administration in time range)    Or  ondansetron (ZOFRAN) injection 4 mg (has no administration in time range)  polyethylene glycol (MIRALAX / GLYCOLAX) packet 17 g (has no administration in time range)  lactated ringers bolus 1,000 mL (0 mLs Intravenous Stopped 12/05/21 1837)  lactated ringers bolus 1,000 mL (0 mLs Intravenous Stopped 12/05/21 2203)  ceFEPIme (MAXIPIME) 2 g in sodium chloride 0.9 % 100 mL IVPB (0 g Intravenous Stopped 12/05/21 2101)  metroNIDAZOLE (FLAGYL) IVPB 500 mg  (0 mg Intravenous Stopped 12/05/21 2101)  vancomycin (VANCOREADY) IVPB 1250 mg/250 mL (0 mg Intravenous Stopped 12/05/21 2237)    ED Course/ Medical Decision Making/ A&P                           Medical Decision Making Amount and/or Complexity of Data Reviewed Labs: ordered. Radiology: ordered. ECG/medicine tests: ordered.  Risk Prescription drug management. Decision regarding hospitalization.   This patient presents to the ED for concern of generalized weakness, this involves an extensive number of treatment options, and is a complaint that carries with it a high risk of complications and morbidity.  The differential diagnosis includes sepsis, viral infection, dehydration, deconditioning, polypharmacy   Co morbidities that complicate the patient evaluation  HTN, atrial fibrillation, GERD, iron deficiency anemia, CVA, arthritis   Additional history obtained:  Additional history obtained from patient's daughter External records from outside source obtained and reviewed including EMR   Lab Tests:  I Ordered, and personally interpreted labs.  The pertinent results include: Leukocytosis, lactic acidosis, UTI, AKI   Imaging Studies ordered:  I ordered imaging studies including chest x-ray I independently visualized  and interpreted imaging which showed no acute findings I agree with the radiologist interpretation   Cardiac Monitoring:  The patient was maintained on a cardiac monitor.  I personally viewed and interpreted the cardiac monitored which showed an underlying rhythm of: Sinus rhythm   Medicines ordered and prescription drug management:  I ordered medication including IV fluids and broad-spectrum antibiotics for sepsis Reevaluation of the patient after these medicines showed that the patient improved I have reviewed the patients home medicines and have made adjustments as needed   Critical Interventions:  IV fluids and broad-spectrum antibiotics for  sepsis   Problem List / ED Course:  88 year old female presenting from home for generalized weakness.  Vital signs on arrival notable for widened pulse pressure.  On exam, patient has dry oral mucosa and global weakness.  1 L bolus of IV fluids was ordered.  Lab work is consistent with sepsis.  Patient is also found to have AKI.  I suspect she is significantly dehydrated from very little p.o. intake.  Additional IV fluids was ordered in addition to broad-spectrum antibiotics.  Results of urinalysis are consistent with UTI.  Patient will require admission to the hospital.   Reevaluation:  After the interventions noted above, I reevaluated the patient and found that they have :improved   Social Determinants of Health:  Elderly, nonambulatory   Dispostion:  After consideration of the diagnostic results and the patients response to treatment, I feel that the patent would benefit from admission.   CRITICAL CARE Performed by: Godfrey Pick   Total critical care time: 35 minutes  Critical care time was exclusive of separately billable procedures and treating other patients.  Critical care was necessary to treat or prevent imminent or life-threatening deterioration.  Critical care was time spent personally by me on the following activities: development of treatment plan with patient and/or surrogate as well as nursing, discussions with consultants, evaluation of patient's response to treatment, examination of patient, obtaining history from patient or surrogate, ordering and performing treatments and interventions, ordering and review of laboratory studies, ordering and review of radiographic studies, pulse oximetry and re-evaluation of patient's condition.          Final Clinical Impression(s) / ED Diagnoses Final diagnoses:  Sepsis, due to unspecified organism, unspecified whether acute organ dysfunction present [facility])  Acute pyelonephritis  AKI (acute kidney injury) ([place])   Dehydration    Rx / DC Orders ED Discharge Orders     None         Godfrey Pick, MD 12/06/21 0200

## 2021-12-06 ENCOUNTER — Inpatient Hospital Stay (HOSPITAL_COMMUNITY): Payer: Medicare HMO

## 2021-12-06 DIAGNOSIS — Z7189 Other specified counseling: Secondary | ICD-10-CM

## 2021-12-06 DIAGNOSIS — K769 Liver disease, unspecified: Secondary | ICD-10-CM

## 2021-12-06 DIAGNOSIS — Z853 Personal history of malignant neoplasm of breast: Secondary | ICD-10-CM

## 2021-12-06 DIAGNOSIS — Z66 Do not resuscitate: Secondary | ICD-10-CM

## 2021-12-06 DIAGNOSIS — A419 Sepsis, unspecified organism: Secondary | ICD-10-CM

## 2021-12-06 DIAGNOSIS — E860 Dehydration: Secondary | ICD-10-CM

## 2021-12-06 DIAGNOSIS — R531 Weakness: Secondary | ICD-10-CM | POA: Diagnosis not present

## 2021-12-06 DIAGNOSIS — Z515 Encounter for palliative care: Secondary | ICD-10-CM

## 2021-12-06 DIAGNOSIS — R17 Unspecified jaundice: Secondary | ICD-10-CM

## 2021-12-06 DIAGNOSIS — C50919 Malignant neoplasm of unspecified site of unspecified female breast: Secondary | ICD-10-CM | POA: Diagnosis not present

## 2021-12-06 DIAGNOSIS — N390 Urinary tract infection, site not specified: Secondary | ICD-10-CM

## 2021-12-06 DIAGNOSIS — N179 Acute kidney failure, unspecified: Secondary | ICD-10-CM

## 2021-12-06 DIAGNOSIS — R748 Abnormal levels of other serum enzymes: Secondary | ICD-10-CM

## 2021-12-06 DIAGNOSIS — N10 Acute tubulo-interstitial nephritis: Secondary | ICD-10-CM

## 2021-12-06 LAB — MRSA NEXT GEN BY PCR, NASAL: MRSA by PCR Next Gen: NOT DETECTED

## 2021-12-06 LAB — PROTIME-INR
INR: 3.1 — ABNORMAL HIGH (ref 0.8–1.2)
Prothrombin Time: 31.7 seconds — ABNORMAL HIGH (ref 11.4–15.2)

## 2021-12-06 LAB — COMPREHENSIVE METABOLIC PANEL
ALT: 36 U/L (ref 0–44)
AST: 69 U/L — ABNORMAL HIGH (ref 15–41)
Albumin: 2 g/dL — ABNORMAL LOW (ref 3.5–5.0)
Alkaline Phosphatase: 162 U/L — ABNORMAL HIGH (ref 38–126)
Anion gap: 9 (ref 5–15)
BUN: 29 mg/dL — ABNORMAL HIGH (ref 8–23)
CO2: 18 mmol/L — ABNORMAL LOW (ref 22–32)
Calcium: 8.8 mg/dL — ABNORMAL LOW (ref 8.9–10.3)
Chloride: 108 mmol/L (ref 98–111)
Creatinine, Ser: 1.01 mg/dL — ABNORMAL HIGH (ref 0.44–1.00)
GFR, Estimated: 53 mL/min — ABNORMAL LOW (ref >60)
Glucose, Bld: 91 mg/dL (ref 70–99)
Potassium: 4.7 mmol/L (ref 3.5–5.1)
Sodium: 135 mmol/L (ref 135–145)
Total Bilirubin: 6.2 mg/dL — ABNORMAL HIGH (ref 0.3–1.2)
Total Protein: 5 g/dL — ABNORMAL LOW (ref 6.5–8.1)

## 2021-12-06 LAB — CBC
HCT: 25.5 % — ABNORMAL LOW (ref 36.0–46.0)
Hemoglobin: 7.7 g/dL — ABNORMAL LOW (ref 12.0–15.0)
MCH: 23.3 pg — ABNORMAL LOW (ref 26.0–34.0)
MCHC: 30.2 g/dL (ref 30.0–36.0)
MCV: 77.3 fL — ABNORMAL LOW (ref 80.0–100.0)
Platelets: 252 10*3/uL (ref 150–400)
RBC: 3.3 MIL/uL — ABNORMAL LOW (ref 3.87–5.11)
RDW: 31.8 % — ABNORMAL HIGH (ref 11.5–15.5)
WBC: 18.4 10*3/uL — ABNORMAL HIGH (ref 4.0–10.5)
nRBC: 0.3 % — ABNORMAL HIGH (ref 0.0–0.2)

## 2021-12-06 LAB — LACTIC ACID, PLASMA: Lactic Acid, Venous: 3.3 mmol/L (ref 0.5–1.9)

## 2021-12-06 LAB — HEMOGLOBIN AND HEMATOCRIT, BLOOD
HCT: 25.5 % — ABNORMAL LOW (ref 36.0–46.0)
Hemoglobin: 7.4 g/dL — ABNORMAL LOW (ref 12.0–15.0)

## 2021-12-06 LAB — PREPARE RBC (CROSSMATCH)

## 2021-12-06 MED ORDER — BOOST / RESOURCE BREEZE PO LIQD CUSTOM
1.0000 | Freq: Three times a day (TID) | ORAL | Status: DC
  Administered 2021-12-06 – 2021-12-08 (×3): 1 via ORAL

## 2021-12-06 MED ORDER — ACETAMINOPHEN 160 MG/5ML PO SOLN
500.0000 mg | Freq: Four times a day (QID) | ORAL | Status: DC | PRN
  Administered 2021-12-06: 500 mg via ORAL
  Filled 2021-12-06: qty 20.3

## 2021-12-06 MED ORDER — METOPROLOL TARTRATE 25 MG PO TABS
12.5000 mg | ORAL_TABLET | Freq: Two times a day (BID) | ORAL | Status: DC
  Administered 2021-12-06 – 2021-12-08 (×5): 12.5 mg via ORAL
  Filled 2021-12-06 (×5): qty 1

## 2021-12-06 MED ORDER — PANTOPRAZOLE SODIUM 40 MG IV SOLR
40.0000 mg | Freq: Two times a day (BID) | INTRAVENOUS | Status: DC
  Administered 2021-12-06 – 2021-12-07 (×3): 40 mg via INTRAVENOUS
  Filled 2021-12-06 (×3): qty 10

## 2021-12-06 MED ORDER — SODIUM CHLORIDE 0.9 % IV SOLN
1.0000 g | Freq: Two times a day (BID) | INTRAVENOUS | Status: DC
  Administered 2021-12-06 – 2021-12-07 (×3): 1 g via INTRAVENOUS
  Filled 2021-12-06 (×3): qty 20

## 2021-12-06 MED ORDER — DEXTROSE-NACL 5-0.9 % IV SOLN: INTRAVENOUS | Status: AC

## 2021-12-06 MED ORDER — IOHEXOL 300 MG/ML  SOLN
100.0000 mL | Freq: Once | INTRAMUSCULAR | Status: AC | PRN
  Administered 2021-12-06: 100 mL via INTRAVENOUS

## 2021-12-06 MED ORDER — SODIUM CHLORIDE 0.9% IV SOLUTION: Freq: Once | INTRAVENOUS | Status: DC

## 2021-12-06 NOTE — Progress Notes (Unsigned)
{  Select_TRH_Note:[id number]} 

## 2021-12-06 NOTE — Progress Notes (Signed)
PROGRESS NOTE  Sara Garcia LYY:[id number] DOB: 10/15/1927 DOA: 12/05/2021 PCP: Sara Albee, MD (Inactive)  HPI/Recap of past 24 hours: Sara Garcia is a 88 y.o. female with medical history significant for CVA, atrial fibrillation, hypertension, recently diagnosed 3 months ago with R breast cancer, estrogen receptor positive.  Who presented to [facility] from home with reports of generalized weakness, nausea with dry heaving.  Onset post iron infusion on 11/30/2021.  Work-up revealed presumptive UTI, AKI, acute transaminitis, elevated INR, presumptive biliary obstruction, acute blood loss anemia, concern for acute cholecystitis.  Started on Rocephin, switch to Merrem to provide broader antibiotic spectrum.  GI consulted due to concern for biliary obstruction.  CT scan abdomen and pelvis done with contrast done on 12/06/2018 showed multiple liver lesions, right lung lesions with concern for metastatic disease.  1 unit PRBC transfused for hemoglobin of 7.2.  Xarelto DC'd and IV Protonix 40 mg twice daily started due to concern for upper GI bleed.  12/06/2021: Patient was seen and examined at the bedside.  Tenderness with palpation diffusely.  Endorses persistent nausea but no vomiting.  Assessment/Plan: Principal Problem:   Generalized weakness Active Problems:   Essential hypertension   Atrial fibrillation (HCC)   Chronic anticoagulation   UTI (urinary tract infection)   Elevated liver enzymes   AKI (acute kidney injury) (HCC)  Generalized weakness likely multifactorial Likely secondary to dehydration from poor oral intake, presumptive UTI, presumptive acute cholecystitis.  No focal deficits.   Significant lactic acidosis 3.6 > 3.9 after 1 L bolus.    Multiple reevaluations, lung lesions with concern for malignancy and metastatic disease. Recently diagnosed with right breast cancer, 3 months ago. Palliative care team consulted to assist with goals of care discussions.  Lactic acidosis,  secondary to dehydration, presumptive UTI, presumptive acute cholecystitis. Defer decision to obtain HIDA scan to GI. Continue Merrem for presumptive acute cholecystitis. Continue to follow urine culture and blood cultures x2.   Acute blood loss anemia with concern for possible upper GI bleed Presented with hemoglobin greater than 10, downtrending 7.4. 1 unit PRBC ordered to be transfused, keep hemoglobin above 8.0. Start IV Protonix 40 mg twice daily Obtain FOBT Continue to monitor H&H  AKI (acute kidney injury) ([place])- (present on admission) Creatinine 1.28, baseline 0.7-0.8.  Likely prerenal from poor oral intake. Continue to encourage hydration Closely monitor urine output with strict I's and O's. Avoid nephrotoxic agents, dehydration and hypotension.   Elevated liver chemistries with concern for biliary obstruction ALP 219, ALT 45, AST 81, total bilirubin 6.8.  INR 3.2.   Continue to trend. Avoid hepatotoxic agents GI consulted   Paroxysmal atrial fibrillation ([place])- (present on admission) Currently in sinus rhythm.  On chronic anticoagulation with Xarelto.   Rate controlled on Cardizem and metoprolol.   Hold Cardizem to avoid hypotension. Continue Lopressor to avoid beta-blocker withdrawal, reduce dose to 12.5 mg twice daily. Closely monitor vital signs, maintain MAP greater than 65.    Essential hypertension- (present on admission) Hold off on Cardizem Continue home metoprolol at lower doses 12.5 mg twice daily. Avoid hypotension due to concern for possible upper GI bleed Continue to closely monitor vital signs.   Critical care time: 65 minutes.  DVT prophylaxis: Xarelto, DC'd on 12/06/2021 due to acute blood loss and concern for occult upper GI bleed. Code Status: DNR-confirmed with daughter Sara Garcia and patient at bedside.(Family would like to have a universal DNR form on discharge) Family Communication: Daughter Sara Garcia- at bedside this primary  decision maker.  Patient  does not have designated HCPOA. Disposition Plan: ~ 2 days Consults called: GI. Admission status: Inpt tele     Status is: Inpatient  Patient requires at least 2 midnights for further evaluation and treatment of present condition.      Objective: Vitals:   12/06/21 0016 12/06/21 0355 12/06/21 0823 12/06/21 1300  BP: (!) 126/47 (!) 100/36 (!) 112/48 (!) 122/54  Pulse: 98 84 72 78  Resp:   16 16  Temp: 98.7 F (37.1 C) 98.8 F (37.1 C) 98.2 F (36.8 C)   TempSrc: Oral Oral Oral   SpO2: 95% 92% 94% 94%  Weight:      Height:        Intake/Output Summary (Last 24 hours) at 12/06/2021 1411 Last data filed at 12/06/2021 0900 Gross per 24 hour  Intake 654.75 ml  Output --  Net 654.75 ml   Filed Weights   12/05/21 1635  Weight: 62.3 kg    Exam:  General: 88 y.o. year-old female well developed well nourished in no acute distress.  Alert and oriented x3. Cardiovascular: Regular rate and rhythm with no rubs or gallops.  No thyromegaly or JVD noted.   Respiratory: Clear to auscultation with no wheezes or rales. Good inspiratory effort. Abdomen: Soft diffuse tenderness with palpation nondistended with normal bowel sounds x4 quadrants. Musculoskeletal: No lower extremity edema bilaterally. Skin: No ulcerative lesions noted or rashes. Psychiatry: Mood is appropriate for condition and setting Neuro: Moves all 4 extremities, nonfocal exam.   Data Reviewed: CBC: Recent Labs  Lab 12/05/21 1736 12/06/21 0456 12/06/21 0855  WBC 20.5* 18.4*  --   NEUTROABS 15.0*  --   --   HGB 10.7* 7.7* 7.4*  HCT 35.3* 25.5* 25.5*  MCV 75.6* 77.3*  --   PLT 320 252  --    Basic Metabolic Panel: Recent Labs  Lab 12/05/21 1736 12/06/21 0456  NA 136 135  K 5.6* 4.7  CL 105 108  CO2 19* 18*  GLUCOSE 89 91  BUN 37* 29*  CREATININE 1.28* 1.01*  CALCIUM 9.8 8.8*  MG 2.4  --    GFR: Estimated Creatinine Clearance: 31.5 mL/min (A) (by C-G formula based on SCr of 1.01 mg/dL  (H)). Liver Function Tests: Recent Labs  Lab 12/05/21 1736 12/06/21 0456  AST 81* 69*  ALT 45* 36  ALKPHOS 219* 162*  BILITOT 6.8* 6.2*  PROT 6.8 5.0*  ALBUMIN 2.7* 2.0*   No results for input(s): LIPASE, AMYLASE in the last 168 hours. No results for input(s): AMMONIA in the last 168 hours. Coagulation Profile: Recent Labs  Lab 12/05/21 1736 12/06/21 0456  INR 3.2* 3.1*   Cardiac Enzymes: No results for input(s): CKTOTAL, CKMB, CKMBINDEX, TROPONINI in the last 168 hours. BNP (last 3 results) No results for input(s): PROBNP in the last 8760 hours. HbA1C: No results for input(s): HGBA1C in the last 72 hours. CBG: No results for input(s): GLUCAP in the last 168 hours. Lipid Profile: No results for input(s): CHOL, HDL, LDLCALC, TRIG, CHOLHDL, LDLDIRECT in the last 72 hours. Thyroid Function Tests: No results for input(s): TSH, T4TOTAL, FREET4, T3FREE, THYROIDAB in the last 72 hours. Anemia Panel: No results for input(s): VITAMINB12, FOLATE, FERRITIN, TIBC, IRON, RETICCTPCT in the last 72 hours. Urine analysis:    Component Value Date/Time   COLORURINE AMBER (A) 12/05/2021 1955   APPEARANCEUR CLOUDY (A) 12/05/2021 1955   LABSPEC 1.015 12/05/2021 1955   PHURINE 5.0 12/05/2021 1955   GLUCOSEU NEGATIVE  12/05/2021 1955   HGBUR SMALL (A) 12/05/2021 1955   BILIRUBINUR NEGATIVE 12/05/2021 1955   KETONESUR NEGATIVE 12/05/2021 1955   PROTEINUR NEGATIVE 12/05/2021 1955   UROBILINOGEN 0.2 10/20/2013 1434   NITRITE POSITIVE (A) 12/05/2021 1955   LEUKOCYTESUR LARGE (A) 12/05/2021 1955   Sepsis Labs: @LABRCNTIP (procalcitonin:4,lacticidven:4)  ) Recent Results (from the past 240 hour(s))  Blood Culture (routine x 2)     Status: None (Preliminary result)   Collection Time: 12/05/21  5:36 PM   Specimen: BLOOD  Result Value Ref Range Status   Specimen Description BLOOD LEFT ANTECUBITAL  Final   Special Requests   Final    BOTTLES DRAWN AEROBIC AND ANAEROBIC Blood Culture  adequate volume   Culture   Final    NO GROWTH < 24 HOURS Performed at [facility], [address] [place], [place] [address]    Report Status PENDING  Incomplete  Blood Culture (routine x 2)     Status: None (Preliminary result)   Collection Time: 12/05/21  5:37 PM   Specimen: BLOOD  Result Value Ref Range Status   Specimen Description BLOOD BLOOD LEFT HAND  Final   Special Requests   Final    BOTTLES DRAWN AEROBIC AND ANAEROBIC Blood Culture adequate volume   Culture   Final    NO GROWTH < 24 HOURS Performed at [facility], [address] [place], [place] [address]    Report Status PENDING  Incomplete  Resp Panel by RT-PCR (Flu A&B, Covid) Nasopharyngeal Swab     Status: None   Collection Time: 12/05/21  6:40 PM   Specimen: Nasopharyngeal Swab; Nasopharyngeal(NP) swabs in vial transport medium  Result Value Ref Range Status   SARS Coronavirus 2 by RT PCR NEGATIVE NEGATIVE Final    Comment: (NOTE) SARS-CoV-2 target nucleic acids are NOT DETECTED.  The SARS-CoV-2 RNA is generally detectable in upper respiratory specimens during the acute phase of infection. The lowest concentration of SARS-CoV-2 viral copies this assay can detect is 138 copies/mL. A negative result does not preclude SARS-Cov-2 infection and should not be used as the sole basis for treatment or other patient management decisions. A negative result may occur with  improper specimen collection/handling, submission of specimen other than nasopharyngeal swab, presence of viral mutation(s) within the areas targeted by this assay, and inadequate number of viral copies(<138 copies/mL). A negative result must be combined with clinical observations, patient history, and epidemiological information. The expected result is Negative.  Fact Sheet for Patients:  EntrepreneurPulse.com.au  Fact Sheet for Healthcare Providers:  IncredibleEmployment.be  This test is no t yet approved or  cleared by the [place] FDA and  has been authorized for detection and/or diagnosis of SARS-CoV-2 by FDA under an Emergency Use Authorization (EUA). This EUA will remain  in effect (meaning this test can be used) for the duration of the COVID-19 declaration under Section 564(b)(1) of the Act, 21 U.S.C.section 360bbb-3(b)(1), unless the authorization is terminated  or revoked sooner.       Influenza A by PCR NEGATIVE NEGATIVE Final   Influenza B by PCR NEGATIVE NEGATIVE Final    Comment: (NOTE) The Xpert Xpress SARS-CoV-2/FLU/RSV plus assay is intended as an aid in the diagnosis of influenza from Nasopharyngeal swab specimens and should not be used as a sole basis for treatment. Nasal washings and aspirates are unacceptable for Xpert Xpress SARS-CoV-2/FLU/RSV testing.  Fact Sheet for Patients: EntrepreneurPulse.com.au  Fact Sheet for Healthcare Providers: IncredibleEmployment.be  This test is not yet approved or  cleared by the [place] and has been authorized for detection and/or diagnosis of SARS-CoV-2 by FDA under an Emergency Use Authorization (EUA). This EUA will remain in effect (meaning this test can be used) for the duration of the COVID-19 declaration under Section 564(b)(1) of the Act, 21 U.S.C. section 360bbb-3(b)(1), unless the authorization is terminated or revoked.  Performed at [facility], [address], [place], [place] [address]       Studies: CT ABDOMEN PELVIS W CONTRAST  Result Date: 12/06/2021 CLINICAL DATA:  Weakness and nausea since iron infusion last week. Generalized abdominal pain. EXAM: CT ABDOMEN AND PELVIS WITH CONTRAST TECHNIQUE: Multidetector CT imaging of the abdomen and pelvis was performed using the standard protocol following bolus administration of intravenous contrast. RADIATION DOSE REDUCTION: This exam was performed according to the departmental dose-optimization program which includes  automated exposure control, adjustment of the mA and/or kV according to patient size and/or use of iterative reconstruction technique. CONTRAST:  151mL OMNIPAQUE IOHEXOL 300 MG/ML  SOLN COMPARISON:  September 18, 2017. FINDINGS: Despite efforts by the technologist and patient, motion artifact is present on today's exam and could not be eliminated. This reduces exam sensitivity and specificity. Lower chest: Solid 2.9 cm right upper lobe perihilar pulmonary nodule on image 7/5. There are 2 nodules in the right lung base both of which measure 8 mm on image 13/5. Possible tiny bilateral pleural effusions. Calcifications of mitral annulus. Hepatobiliary: Innumerable bilobar hypodense hepatic lesions for instance a 4.3 x 2.7 cm lesion in the left lobe of the liver on image 21/2 and a 5.2 x 4.4 cm lesion in the right lobe of the liver on image 23/2. Nodular hepatic contour which may reflect cirrhosis. Gallbladder is decompressed however there is prominent wall thickening with trace pericholecystic fluid. No biliary ductal dilation. Pancreas: No pancreatic ductal dilation or evidence of acute inflammation. Spleen: No splenomegaly or focal splenic lesion. Adrenals/Urinary Tract: Bilateral adrenal glands appear normal. No hydronephrosis. Symmetric enhancement and excretion of contrast from bilateral kidneys. Urinary bladder is unremarkable for degree of distension. Stomach/Bowel: No enteric contrast was administered. Stomach is predominantly decompressed limiting evaluation. No pathologic dilation of small or large bowel. No evidence of acute bowel inflammation. Vascular/Lymphatic: Aortic atherosclerosis without abdominal aortic aneurysm. No pathologically enlarged abdominal or pelvic lymph nodes. Reproductive: Pessary device in the vagina. No suspicious adnexal mass. Other: Trace abdominopelvic free fluid.  No pneumoperitoneum. Musculoskeletal: Diffuse demineralization of bone. Dextroconvex curvature of the thoracolumbar  spine. Degenerative changes bilateral hips. Partially visualized left femoral fixation hardware. No aggressive lytic or blastic lesion of bone. IMPRESSION: Examination is significantly degraded by motion. Within this context: 1. Innumerable bilobar hypodense hepatic lesions, highly suspicious for metastatic disease. 2. Solid right-sided pulmonary nodules, also suspicious for metastatic disease. 3. Nodular hepatic contour which may reflect cirrhosis or sequela of metastatic disease. 4. Gallbladder is decompressed with prominent wall thickening and trace pericholecystic fluid, appearance which is most commonly associated with chronic hepatocellular disease. If concern for acute cholecystitis consider further evaluation with nuclear medicine HIDA scan. 5. Trace abdominopelvic free fluid. 6.  Aortic Atherosclerosis (ICD10-I70.0). * onc * Electronically Signed   By: Dahlia Bailiff M.D.   On: 12/06/2021 10:30   DG Chest Port 1 View  Result Date: 12/05/2021 CLINICAL DATA:  A 88 year old female presents with questionable sepsis. EXAM: PORTABLE CHEST 1 VIEW COMPARISON:  Comparison is made with chest x-ray from December of 2018 and chest CT from 2017. FINDINGS: The images rotated to the RIGHT. Accounting  for this cardiomediastinal contours and hilar structures are stable. Lungs are clear aside from nodular changes in the area of the RIGHT infrahilar region/middle lobe just over the RIGHT hemidiaphragm. In the interval there been changes of RIGHT mastectomy. No lobar consolidation or effusion. Signs of prior rib fracture along the posterior LEFT chest, upper chest. Osteopenia. On limited assessment there is no acute skeletal process. IMPRESSION: 1. Persistent nodule and or mass in the RIGHT infrahilar region is suggested on the current radiograph. This was noted on prior imaging studies dating back to 2018 with no recent imaging for comparison. Correlate with any prior imaging or history to determine whether this has been  addressed. 2. RIGHT mastectomy changes with surgical clips over the RIGHT chest and breast. 3. No acute cardiopulmonary disease. Electronically Signed   By: Zetta Bills M.D.   On: 12/05/2021 17:58    Scheduled Meds:  sodium chloride   Intravenous Once   DULoxetine  60 mg Oral BID   feeding supplement  1 Container Oral TID BM   letrozole  2.5 mg Oral Daily   metoprolol tartrate  12.5 mg Oral BID   pantoprazole  40 mg Oral Daily   Rivaroxaban  15 mg Oral Q supper    Continuous Infusions:  dextrose 5 % and 0.9% NaCl 100 mL/hr at 12/06/21 0822   meropenem (MERREM) IV 1 g (12/06/21 1308)     LOS: 1 day     Kayleen Memos, MD [facility] Pager [phone]  If 7PM-7AM, please contact night-coverage [email or web address] Password [facility] 12/06/2021, 2:11 PM

## 2021-12-06 NOTE — Consult Note (Signed)
[facility] Consultation Oncology  Name: Sara Garcia      MRN: [id number]    Location: A315/A315-01  Date: 12/06/2021 Time:4:59 PM   REFERRING PHYSICIAN: Dr. Nevada Garcia  REASON FOR CONSULT: Presumed metastatic breast cancer    HISTORY OF PRESENT ILLNESS: Sara Garcia is a 88 year old female who is seen in consultation today at the request of Dr. Nevada Garcia for further management of presumed metastatic right breast cancer.  She was diagnosed in October with HER2 positive right breast cancer after an abnormal mammogram.  She underwent right maste[facility] on 09/18/[facility] at [facility].  This was consistent with T2N0 HER2 positive breast cancer.  Because of her advanced age, she was started on letrozole only.  For the past 4 weeks she has been having generalized weakness nausea and dry heaving and presented to the ER.  A CT scan in the ER revealed innumerable liver lesions, largest measuring 5.2 x 4.4 cm.  Lower chest also showed solid right lung lesions.  Labs showed acute kidney injury and acute transaminitis.  She is also on Xarelto for atrial fibrillation.  GI was consulted and the liver tumor burden was thought to be likely responsible for transaminitis.  She was evaluated by Dr. Erven Colla at [facility].  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  She lives at home with her husband.  Her son and daughter live close by.  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   Stroke ([place])    Unspecified prolapse of vaginal walls      ALLERGIES: Allergies  Allergen Reactions   Latex Itching and Rash   Adhesive [Tape] Other (See Comments)    Tears skin   Betadine [Povidone Iodine]     blisters   Ciprofloxacin Nausea And Vomiting   Codeine Nausea And Vomiting    Upsets stomach, nightmares   Penicillins Hives    Has patient had a PCN reaction causing immediate rash, facial/tongue/throat swelling, SOB or lightheadedness with hypotension: yes Has patient had a PCN reaction causing severe rash involving mucus membranes or skin necrosis: yes Has patient had a PCN reaction that required hospitalization: no Has patient had a PCN reaction occurring within the last 10 years: no If all of the above answers are "NO", then may proceed with Cephalosporin use.    Sulfa Antibiotics Hives   Tramadol    Wellbutrin [Bupropion]     Reaction unknown   Aloe Rash      MEDICATIONS: I have reviewed the patient's current medications.     PAST SURGICAL HISTORY Past Surgical History:  Procedure Laterality Date   ABDOMINAL HYSTERECTOMY     COLONOSCOPY W/ BIOPSIES  02/01/2004   RMR: Anal papilla with normal rectum/Sigmoid diverticula/Small polyps in the cecum removed as described above. Inflammatory polyp   ESOPHAGEAL DILATION     unknown year per patient.   ESOPHAGEAL DILATION N/A 02/07/2015   RMR: Normal esophagus status post  maloney dilation. Small hiatal hernia.    ESOPHAGOGASTRODUODENOSCOPY     unknown year per patient   ESOPHAGOGASTRODUODENOSCOPY N/A 02/07/2015   RMR: Normal esophagus  status post  Maloney dilation. Small hiatal hernia   FALSE ANEURYSM REPAIR Left 09/23/2017   Procedure: REPAIR FALSE ANEURYSM COMMON LEFT FEMORAL ARTERY WITH OSTEOTOMY OF BONE FRAGEMENT;  Surgeon: Angelia Mould, MD;  Location: [place];  Service: Vascular;  Laterality: Left;   FEMUR IM NAIL Left 06/11/2017   Procedure: INTRAMEDULLARY (IM) NAIL FEMORAL;  Surgeon: Rod Can, MD;  Location: [facility];  Service: Orthopedics;   Laterality: Left;   LEG SURGERY Left    MASTECTOMY Right 09/2021   NASAL SEPTUM SURGERY     TONSILLECTOMY     WRIST FRACTURE SURGERY      FAMILY HISTORY: Family History  Problem Relation Age of Onset   Heart disease Mother    Stroke Mother    Emphysema Father    COPD Sister    Liver disease Brother    Colon polyps Neg Hx    Colon cancer Neg Hx     SOCIAL HISTORY:  reports that she has never smoked. She has never used smokeless tobacco. She reports that she does not drink alcohol and does not use drugs.  PERFORMANCE STATUS: The patient's performance status is 3 - Symptomatic, >50% confined to bed  PHYSICAL EXAM: Most Recent Vital Signs: Blood pressure (!) 121/45, pulse 78, temperature 98.2 F (36.8 C), temperature source Oral, resp. rate 16, height 5' 2"  (1.575 m), weight 137 lb 6.4 oz (62.3 kg), SpO2 94 %. BP (!) 121/45    Pulse 78    Temp 98.2 F (36.8 C) (Oral)    Resp 16    Ht 5' 2"  (1.575 m)    Wt 137 lb 6.4 oz (62.3 kg)    SpO2 96%    BMI 25.13 kg/m  General appearance: alert, cooperative, and appears stated age Extremities:  No edema cyanosis. Neurologic: Grossly normal  LABORATORY DATA:  Results for orders placed or performed during the hospital encounter of 12/05/21 (from the past 48 hour(s))  Lactic acid, plasma     Status: Abnormal   Collection Time: 12/05/21  5:36 PM  Result Value Ref Range   Lactic Acid, Venous 3.6 (HH) 0.5 - 1.9 mmol/L    Comment: CRITICAL RESULT CALLED TO, READ BACK BY AND VERIFIED WITH: SPENCE,H ON 12/05/21 AT 1905 BY LOY,C Performed at [facility], [address], [place], [place] [address]   Comprehensive metabolic panel     Status: Abnormal   Collection Time: 12/05/21  5:36 PM  Result Value Ref Range   Sodium 136 135 - 145 mmol/L   Potassium 5.6 (H) 3.5 - 5.1 mmol/L   Chloride 105 98 - 111 mmol/L   CO2 19 (L) 22 - 32 mmol/L   Glucose, Bld 89 70 - 99 mg/dL    Comment: Glucose reference range applies only to samples taken after  fasting for at least 8 hours.   BUN 37 (H) 8 - 23 mg/dL   Creatinine, Ser 1.28 (H) 0.44 - 1.00 mg/dL   Calcium 9.8 8.9 - 10.3 mg/dL   Total Protein 6.8 6.5 - 8.1 g/dL   Albumin 2.7 (L) 3.5 - 5.0 g/dL   AST 81 (H) 15 - 41 U/L   ALT 45 (H) 0 - 44 U/L   Alkaline Phosphatase 219 (H) 38 - 126 U/L   Total Bilirubin 6.8 (H) 0.3 - 1.2 mg/dL  GFR, Estimated 40 (L) >60 mL/min    Comment: (NOTE) Calculated using the CKD-EPI Creatinine Equation (2021)    Anion gap 12 5 - 15    Comment: Performed at [facility], [address] [place], [place] [address]  CBC WITH DIFFERENTIAL     Status: Abnormal   Collection Time: 12/05/21  5:36 PM  Result Value Ref Range   WBC 20.5 (H) 4.0 - 10.5 K/uL   RBC 4.67 3.87 - 5.11 MIL/uL   Hemoglobin 10.7 (L) 12.0 - 15.0 g/dL   HCT 35.3 (L) 36.0 - 46.0 %   MCV 75.6 (L) 80.0 - 100.0 fL   MCH 22.9 (L) 26.0 - 34.0 pg   MCHC 30.3 30.0 - 36.0 g/dL   RDW 32.2 (H) 11.5 - 15.5 %   Platelets 320 150 - 400 K/uL   nRBC 0.5 (H) 0.0 - 0.2 %   Neutrophils Relative % 75 %   Neutro Abs 15.0 (H) 1.7 - 7.7 K/uL   Lymphocytes Relative 14 %   Lymphs Abs 3.0 0.7 - 4.0 K/uL   Monocytes Relative 9 %   Monocytes Absolute 1.9 (H) 0.1 - 1.0 K/uL   Eosinophils Relative 0 %   Eosinophils Absolute 0.1 0.0 - 0.5 K/uL   Basophils Relative 0 %   Basophils Absolute 0.1 0.0 - 0.1 K/uL   WBC Morphology MORPHOLOGY UNREMARKABLE    Smear Review MORPHOLOGY UNREMARKABLE    Immature Granulocytes 2 %   Abs Immature Granulocytes 0.49 (H) 0.00 - 0.07 K/uL   Polychromasia PRESENT    Target Cells PRESENT     Comment: Performed at [facility], [address], [place], [place] [address]  Protime-INR     Status: Abnormal   Collection Time: 12/05/21  5:36 PM  Result Value Ref Range   Prothrombin Time 32.7 (H) 11.4 - 15.2 seconds   INR 3.2 (H) 0.8 - 1.2    Comment: (NOTE) INR goal varies based on device and disease states. Performed at [facility], [address], [place], [place] [address]    Blood Culture (routine x 2)     Status: None (Preliminary result)   Collection Time: 12/05/21  5:36 PM   Specimen: BLOOD  Result Value Ref Range   Specimen Description BLOOD LEFT ANTECUBITAL    Special Requests      BOTTLES DRAWN AEROBIC AND ANAEROBIC Blood Culture adequate volume   Culture      NO GROWTH < 24 HOURS Performed at [facility], [address], [place], [place] [address]    Report Status PENDING   Troponin I (High Sensitivity)     Status: None   Collection Time: 12/05/21  5:36 PM  Result Value Ref Range   Troponin I (High Sensitivity) 15 <18 ng/L    Comment: (NOTE) Elevated high sensitivity troponin I (hsTnI) values and significant  changes across serial measurements may suggest ACS but many other  chronic and acute conditions are known to elevate hsTnI results.  Refer to the "Links" section for chest pain algorithms and additional  guidance. Performed at [facility], [address], [place], [place] [address]   Magnesium     Status: None   Collection Time: 12/05/21  5:36 PM  Result Value Ref Range   Magnesium 2.4 1.7 - 2.4 mg/dL    Comment: Performed at [facility], [address], [place],  [address]  Blood Culture (routine x 2)     Status: None (Preliminary result)   Collection Time: 12/05/21  5:37 PM   Specimen: BLOOD  Result Value Ref Range   Specimen Description BLOOD BLOOD LEFT HAND    Special Requests      BOTTLES DRAWN AEROBIC AND ANAEROBIC Blood Culture adequate volume   Culture      NO GROWTH < 24 HOURS Performed at [facility], [address], [place], [place] [address]    Report Status PENDING   Resp Panel by RT-PCR (Flu A&B, Covid) Nasopharyngeal Swab     Status: None   Collection Time: 12/05/21  6:40 PM   Specimen: Nasopharyngeal Swab; Nasopharyngeal(NP) swabs in vial transport medium  Result Value Ref Range   SARS Coronavirus 2 by RT PCR NEGATIVE NEGATIVE    Comment: (NOTE) SARS-CoV-2 target nucleic acids are NOT DETECTED.  The  SARS-CoV-2 RNA is generally detectable in upper respiratory specimens during the acute phase of infection. The lowest concentration of SARS-CoV-2 viral copies this assay can detect is 138 copies/mL. A negative result does not preclude SARS-Cov-2 infection and should not be used as the sole basis for treatment or other patient management decisions. A negative result may occur with  improper specimen collection/handling, submission of specimen other than nasopharyngeal swab, presence of viral mutation(s) within the areas targeted by this assay, and inadequate number of viral copies(<138 copies/mL). A negative result must be combined with clinical observations, patient history, and epidemiological information. The expected result is Negative.  Fact Sheet for Patients:  EntrepreneurPulse.com.au  Fact Sheet for Healthcare Providers:  IncredibleEmployment.be  This test is no t yet approved or cleared by the [place] FDA and  has been authorized for detection and/or diagnosis of SARS-CoV-2 by FDA under an Emergency Use Authorization (EUA). This EUA will remain  in effect (meaning this test can be used) for the duration of the COVID-19 declaration under Section 564(b)(1) of the Act, 21 U.S.C.section 360bbb-3(b)(1), unless the authorization is terminated  or revoked sooner.       Influenza A by PCR NEGATIVE NEGATIVE   Influenza B by PCR NEGATIVE NEGATIVE    Comment: (NOTE) The Xpert Xpress SARS-CoV-2/FLU/RSV plus assay is intended as an aid in the diagnosis of influenza from Nasopharyngeal swab specimens and should not be used as a sole basis for treatment. Nasal washings and aspirates are unacceptable for Xpert Xpress SARS-CoV-2/FLU/RSV testing.  Fact Sheet for Patients: EntrepreneurPulse.com.au  Fact Sheet for Healthcare Providers: IncredibleEmployment.be  This test is not yet approved or cleared by the  [place] FDA and has been authorized for detection and/or diagnosis of SARS-CoV-2 by FDA under an Emergency Use Authorization (EUA). This EUA will remain in effect (meaning this test can be used) for the duration of the COVID-19 declaration under Section 564(b)(1) of the Act, 21 U.S.C. section 360bbb-3(b)(1), unless the authorization is terminated or revoked.  Performed at [facility], [address] [place], [place] [address]   Lactic acid, plasma     Status: Abnormal   Collection Time: 12/05/21  7:17 PM  Result Value Ref Range   Lactic Acid, Venous 3.9 (HH) 0.5 - 1.9 mmol/L    Comment: CRITICAL VALUE NOTED.  VALUE IS CONSISTENT WITH PREVIOUSLY REPORTED AND CALLED VALUE. Performed at [facility], [address], [place], [place] [address]   Troponin I (High Sensitivity)     Status: None   Collection Time: 12/05/21  7:17 PM  Result Value Ref Range   Troponin I (High Sensitivity) 13 <18 ng/L    Comment: (NOTE) Elevated high sensitivity troponin I (hsTnI) values and significant  changes across serial measurements may suggest ACS but many other  chronic and acute conditions are known to elevate hsTnI results.  Refer to the "Links" section for chest pain algorithms and additional  guidance. Performed at [facility], [address], [place], [place] [address]   Urinalysis, Routine w reflex microscopic Urine, Clean Catch     Status: Abnormal   Collection Time: 12/05/21  7:55 PM  Result Value Ref Range   Color, Urine AMBER (A) YELLOW    Comment: BIOCHEMICALS MAY BE AFFECTED BY COLOR   APPearance CLOUDY (A) CLEAR   Specific Gravity, Urine 1.015 1.005 - 1.030   pH 5.0 5.0 - 8.0   Glucose, UA NEGATIVE NEGATIVE mg/dL   Hgb urine dipstick SMALL (A) NEGATIVE   Bilirubin Urine NEGATIVE NEGATIVE   Ketones, ur NEGATIVE NEGATIVE mg/dL   Protein, ur NEGATIVE NEGATIVE mg/dL   Nitrite POSITIVE (A) NEGATIVE   Leukocytes,Ua LARGE (A) NEGATIVE   RBC / HPF 21-50 0 - 5 RBC/hpf   WBC, UA >50  (H) 0 - 5 WBC/hpf   Bacteria, UA MANY (A) NONE SEEN   Squamous Epithelial / LPF 0-5 0 - 5   Mucus PRESENT     Comment: Performed at [facility], [address], [place], [place] [address]  Lactic acid, plasma     Status: Abnormal   Collection Time: 12/05/21 10:36 PM  Result Value Ref Range   Lactic Acid, Venous 3.9 (HH) 0.5 - 1.9 mmol/L    Comment: CRITICAL VALUE NOTED.  VALUE IS CONSISTENT WITH PREVIOUSLY REPORTED AND CALLED VALUE. Performed at [facility], [address] [place], [place] [address]   Lactic acid, plasma     Status: Abnormal   Collection Time: 12/06/21 12:44 AM  Result Value Ref Range   Lactic Acid, Venous 3.3 (HH) 0.5 - 1.9 mmol/L    Comment: CRITICAL VALUE NOTED.  VALUE IS CONSISTENT WITH PREVIOUSLY REPORTED AND CALLED VALUE. Performed at [facility], [address], [place], [place] [address]   Protime-INR     Status: Abnormal   Collection Time: 12/06/21  4:56 AM  Result Value Ref Range   Prothrombin Time 31.7 (H) 11.4 - 15.2 seconds   INR 3.1 (H) 0.8 - 1.2    Comment: (NOTE) INR goal varies based on device and disease states. Performed at [facility], [address], [place], [place] [address]   CBC     Status: Abnormal   Collection Time: 12/06/21  4:56 AM  Result Value Ref Range   WBC 18.4 (H) 4.0 - 10.5 K/uL   RBC 3.30 (L) 3.87 - 5.11 MIL/uL   Hemoglobin 7.7 (L) 12.0 - 15.0 g/dL    Comment: DELTA CHECK NOTED REPEATED TO VERIFY    HCT 25.5 (L) 36.0 - 46.0 %   MCV 77.3 (L) 80.0 - 100.0 fL   MCH 23.3 (L) 26.0 - 34.0 pg   MCHC 30.2 30.0 - 36.0 g/dL   RDW 31.8 (H) 11.5 - 15.5 %   Platelets 252 150 - 400 K/uL   nRBC 0.3 (H) 0.0 - 0.2 %    Comment: Performed at [facility], [address], [place],  [address]  Comprehensive metabolic panel     Status: Abnormal   Collection Time: 12/06/21  4:56 AM  Result Value Ref Range   Sodium 135 135 - 145 mmol/L   Potassium 4.7 3.5 - 5.1 mmol/L   Chloride 108 98 - 111 mmol/L   CO2 18 (L) 22 - 32 mmol/L  Glucose, Bld 91 70 - 99 mg/dL    Comment: Glucose reference range applies only to samples taken after fasting for at least 8 hours.   BUN 29 (H) 8 - 23 mg/dL   Creatinine, Ser 1.01 (H) 0.44 - 1.00 mg/dL   Calcium 8.8 (L) 8.9 - 10.3 mg/dL   Total Protein 5.0 (L) 6.5 - 8.1 g/dL   Albumin 2.0 (L) 3.5 - 5.0 g/dL   AST 69 (H) 15 - 41 U/L   ALT 36 0 - 44 U/L   Alkaline Phosphatase 162 (H) 38 - 126 U/L   Total Bilirubin 6.2 (H) 0.3 - 1.2 mg/dL   GFR, Estimated 53 (L) >60 mL/min    Comment: (NOTE) Calculated using the CKD-EPI Creatinine Equation (2021)    Anion gap 9 5 - 15    Comment: Performed at [facility], [address], [place], [place] [address]  Hemoglobin and hematocrit, blood     Status: Abnormal   Collection Time: 12/06/21  8:55 AM  Result Value Ref Range   Hemoglobin 7.4 (L) 12.0 - 15.0 g/dL   HCT 25.5 (L) 36.0 - 46.0 %    Comment: Performed at [facility], [address] [place], [place] [address]  Prepare RBC (crossmatch)     Status: None   Collection Time: 12/06/21  1:07 PM  Result Value Ref Range   Order Confirmation      ORDER PROCESSED BY BLOOD BANK Performed at [facility], [address] [place], [place] [address]   Type and screen [facility]     Status: None (Preliminary result)   Collection Time: 12/06/21  1:27 PM  Result Value Ref Range   ABO/RH(D) O NEG    Antibody Screen NEG    Sample Expiration 12/09/2021,2359    Unit Number [id number]    Blood Component Type RED CELLS,LR    Unit division 00    Status of Unit ALLOCATED    Transfusion Status OK TO TRANSFUSE    Crossmatch Result      Compatible Performed at [facility], [address], [place],  [address]       RADIOGRAPHY: CT ABDOMEN PELVIS W CONTRAST  Result Date: 12/06/2021 CLINICAL DATA:  Weakness and nausea since iron infusion last week. Generalized abdominal pain. EXAM: CT ABDOMEN AND PELVIS WITH CONTRAST TECHNIQUE: Multidetector CT imaging of the abdomen and pelvis was  performed using the standard protocol following bolus administration of intravenous contrast. RADIATION DOSE REDUCTION: This exam was performed according to the departmental dose-optimization program which includes automated exposure control, adjustment of the mA and/or kV according to patient size and/or use of iterative reconstruction technique. CONTRAST:  16m OMNIPAQUE IOHEXOL 300 MG/ML  SOLN COMPARISON:  September 18, 2017. FINDINGS: Despite efforts by the technologist and patient, motion artifact is present on today's exam and could not be eliminated. This reduces exam sensitivity and specificity. Lower chest: Solid 2.9 cm right upper lobe perihilar pulmonary nodule on image 7/5. There are 2 nodules in the right lung base both of which measure 8 mm on image 13/5. Possible tiny bilateral pleural effusions. Calcifications of mitral annulus. Hepatobiliary: Innumerable bilobar hypodense hepatic lesions for instance a 4.3 x 2.7 cm lesion in the left lobe of the liver on image 21/2 and a 5.2 x 4.4 cm lesion in the right lobe of the liver on image 23/2. Nodular hepatic contour which may reflect cirrhosis. Gallbladder is decompressed however there is prominent wall thickening with trace pericholecystic fluid. No biliary ductal  dilation. Pancreas: No pancreatic ductal dilation or evidence of acute inflammation. Spleen: No splenomegaly or focal splenic lesion. Adrenals/Urinary Tract: Bilateral adrenal glands appear normal. No hydronephrosis. Symmetric enhancement and excretion of contrast from bilateral kidneys. Urinary bladder is unremarkable for degree of distension. Stomach/Bowel: No enteric contrast was administered. Stomach is predominantly decompressed limiting evaluation. No pathologic dilation of small or large bowel. No evidence of acute bowel inflammation. Vascular/Lymphatic: Aortic atherosclerosis without abdominal aortic aneurysm. No pathologically enlarged abdominal or pelvic lymph nodes. Reproductive:  Pessary device in the vagina. No suspicious adnexal mass. Other: Trace abdominopelvic free fluid.  No pneumoperitoneum. Musculoskeletal: Diffuse demineralization of bone. Dextroconvex curvature of the thoracolumbar spine. Degenerative changes bilateral hips. Partially visualized left femoral fixation hardware. No aggressive lytic or blastic lesion of bone. IMPRESSION: Examination is significantly degraded by motion. Within this context: 1. Innumerable bilobar hypodense hepatic lesions, highly suspicious for metastatic disease. 2. Solid right-sided pulmonary nodules, also suspicious for metastatic disease. 3. Nodular hepatic contour which may reflect cirrhosis or sequela of metastatic disease. 4. Gallbladder is decompressed with prominent wall thickening and trace pericholecystic fluid, appearance which is most commonly associated with chronic hepatocellular disease. If concern for acute cholecystitis consider further evaluation with nuclear medicine HIDA scan. 5. Trace abdominopelvic free fluid. 6.  Aortic Atherosclerosis (ICD10-I70.0). * onc * Electronically Signed   By: Dahlia Bailiff M.D.   On: 12/06/2021 10:30   DG Chest Port 1 View  Result Date: 12/05/2021 CLINICAL DATA:  A 88 year old female presents with questionable sepsis. EXAM: PORTABLE CHEST 1 VIEW COMPARISON:  Comparison is made with chest x-ray from December of 2018 and chest CT from 2017. FINDINGS: The images rotated to the RIGHT. Accounting for this cardiomediastinal contours and hilar structures are stable. Lungs are clear aside from nodular changes in the area of the RIGHT infrahilar region/middle lobe just over the RIGHT hemidiaphragm. In the interval there been changes of RIGHT mastectomy. No lobar consolidation or effusion. Signs of prior rib fracture along the posterior LEFT chest, upper chest. Osteopenia. On limited assessment there is no acute skeletal process. IMPRESSION: 1. Persistent nodule and or mass in the RIGHT infrahilar region is  suggested on the current radiograph. This was noted on prior imaging studies dating back to 2018 with no recent imaging for comparison. Correlate with any prior imaging or history to determine whether this has been addressed. 2. RIGHT mastectomy changes with surgical clips over the RIGHT chest and breast. 3. No acute cardiopulmonary disease. Electronically Signed   By: Zetta Bills M.D.   On: 12/05/2021 17:58         ASSESSMENT:  1.  Stage Ia (T2 N0 M0) right breast IDC, ER/PR/HER2 positive: - 08/01/2021: Right breast 11:00 biopsy on 08/01/2021: Invasive ductal carcinoma, high-grade, 1.1 cm, ER 100%, PR 80%, HER2 positive by FISH - 09/18/2021: Right mastectomy and lymph node biopsy: 2.7 cm IDC, clear margins, associated DCIS.  3 sentinel lymph nodes and 3 additional lymph nodes with no metastatic disease. - She was started on letrozole.  Chemotherapy/trastuzumab was not given because of her advanced age. - Presented to the ER with 4-week history of progressive generalized weakness. - CT AP with contrast on 12/06/2021 with innumerable bilobar hypodense hepatic lesions suspicious for metastatic disease.  Solid right-sided pulmonary nodules.  Nodular hepatic contour which may reflect cirrhosis or sequela of metastatic disease.  PLAN:  1.  Presumed metastatic HER2 positive breast cancer to the liver and lungs: - I have discussed the findings of the CT scans  and blood work with the patient, her husband and son-in-law who is at bedside in detail. - Given her advanced age, elevated liver enzymes, she is not a candidate for any active therapy. - I have discussed and recommended hospice.  Patient is favoring to go home with hospice rather than going to hospice home. - I will sign off.  If any questions please feel free to call me.  All questions were answered. The patient knows to call the clinic with any problems, questions or concerns. We can certainly see the patient much sooner if  necessary.   Derek Jack

## 2021-12-06 NOTE — Progress Notes (Signed)
One unit PRBC started at 1716 per order. Pt tolerated first 15 minutes of transfusion with no s/s transfusion reaction or fluid overload. Transfusion to run for 3 hours per MD order. Pt and family notified and state understanding.

## 2021-12-06 NOTE — Consult Note (Signed)
Sara Garcia 1928-04-20 [id number]  Referring Provider: Kayleen Memos, DO Primary Care Physician:  Doree Albee, MD (Inactive) Primary Gastroenterologist:  Garfield Cornea, MD Consultation Date: 12/06/2021     Reason for Consultation:  Jaundice with concern for biliary obstruction   History of Present Illness   Sara Garcia is a 88 y.o. 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  Complained of nausea with dry heaving for 1 month, almost daily.  Over the past few days she has had poor oral intake.  Recently had iron infusion.  Describes progressive generalized weakness.  Patient with right mastectomy with limited axillary dissection on 09/18/2021.  Pathology confirmed 2.7 cm invasive ductal carcinoma with clear margins approaching 1 cm.  There was associated DCIS.  3 sentinel lymph nodes and 3 additional lymph nodes with no metastatic disease.  Tumor is ER positive, PR positive, HER2/neu positive.  Patient and son elected not to pursue chemotherapy given her age and 60 and fragility.  She also received iron infusion by hem/onc last week.  Over the past 4 weeks, patient has had progressive generalized weakness.  Her appetite has dwindled especially over the past 1 to 2 weeks. Denies any abdominal pain. No apparent blood in stool. Patient has been nonambulatory for 4 years.  She is able to transfer from bed to commode to wheelchair.  In the ED: Lactic acid was 3.6.  Potassium 5.6, BUN 37, creatinine 1.28.  Albumin 2.7.  Total bilirubin 6.8, alkaline phosphatase 219, AST 81, ALT 45.   White blood cell count 20,500, hemoglobin 10.7 (hemoglobin 8.5 last month), hematocrit 35.3, MCV 75.6, platelets 320,000.  INR 3.2.  SARS coronavirus 2 negative.  Repeat lactic acid 3.9.  UA with nitrites,  large leukocyte esterase, greater than 50 WBC, many bacteria, urine culture pending.  Today her lactic acid is 3.3.  INR 3.1.  White blood cell count 18,400, hemoglobin 7.7, platelets 252,000, potassium 4.7, creatinine 1.01, total bilirubin of 6.2, alkaline phosphatase 162, AST 69, ALT 36, albumin 2.0.  Repeat draw/repeat hemoglobin to verify significant change was 7.4.  Chest x-ray yesterday with persistent nodule and/or mass in the right infrahilar region suggested on current study.  This was noted back in 2018 as well.  Right mastectomy changes.  CT abdomen pelvis with contrast today, somewhat limited by motion artifact.  Solid 2.9 cm right upper lobe perihilar pulmonary nodule.  2 nodules in the right lung base both of which measure 8 mm.  Innumerable bilobar hypodense lesions for instance 4.3 x 2.7 cm lesion in the left lobe of the liver and a 5.2 x 4.4 cm lesion in the right lobe of the liver.  Highly suspicious for metastatic disease.  Nodular hepatic contour.  Gallbladder decompressed, prominent wall thickening with trace pericholecystic fluid.  If concern for acute cholecystitis consider nuclear medicine HIDA scan.  No biliary ductal dilation.  Pancreas unremarkable.  Review of care everywhere: Labs from January 2023 with CA 19-9, normal at 20.25. Total bilirubin 0.7, elevated alkaline phosphatase of 187, elevated AST of 45, ALT 31.  White blood cell count 9600, hemoglobin 8.5, hematocrit 29.8, MCV 71.3, platelets 274,000.  Patient was started on meropenem.  Continued on Xarelto.   Prior to Admission medications   Medication Sig Start Date End Date Taking? Authorizing Provider  albuterol (VENTOLIN  HFA) 108 (90 Base) MCG/ACT inhaler Inhale 1-2 puffs into the lungs every 6 (six) hours as needed for shortness of breath. 11/16/21  Yes [provider]  cholecalciferol (VITAMIN D) 1000 units tablet Take 3,000 Units by mouth 2 (two) times daily.   Yes [provider]  [facility] VITAMIN B12  1000 MCG tablet TAKE 1 TABLET BY MOUTH EVERY DAY 02/16/21  Yes Gosrani, Nimish C, MD  diclofenac (VOLTAREN) 0.1 % ophthalmic solution 4 (four) times daily.   Yes [provider]  diltiazem (CARDIZEM SR) 60 MG 12 hr capsule TAKE 1 CAPSULE (60 MG TOTAL) BY MOUTH IN THE MORNING, AT NOON, AND AT BEDTIME. Patient taking differently: Take 60 mg by mouth 2 (two) times daily. 11/08/21  Yes Arnoldo Lenis, MD  DULoxetine (CYMBALTA) 60 MG capsule TAKE 1 CAPSULE BY MOUTH TWICE A DAY 04/18/21  Yes Ailene Ards, NP  KLOR-CON M20 20 MEQ tablet TAKE 1 TABLET BY MOUTH TWICE A DAY 04/06/21  Yes Ailene Ards, NP  letrozole [facility]) 2.5 MG tablet Take 2.5 mg by mouth daily. 12/04/21  Yes [provider]  metoprolol tartrate (LOPRESSOR) 25 MG tablet TAKE 1 TABLET BY MOUTH 2 TIMES DAILY 02/19/21  Yes Branch, Alphonse Guild, MD  NON FORMULARY Take 2 tablets by mouth 2 (two) times daily at 10 am and 4 pm. CBD gummies; 2 chews 2 times a day.   Yes [provider]  NP THYROID 60 MG tablet TAKE 1 TABLET BY MOUTH EVERY DAY 05/02/21  Yes Gosrani, Nimish C, MD  omeprazole (PRILOSEC) 20 MG capsule Take by mouth.   Yes [provider]  polyethylene glycol powder (GLYCOLAX/MIRALAX) 17 GM/SCOOP powder Take 17 g by mouth daily. 01/27/20  Yes Doree Albee, MD  Rivaroxaban (XARELTO) 15 MG TABS tablet Take 1 tablet (15 mg total) by mouth daily with supper. 05/08/21  Yes Gosrani, Nimish C, MD  Turmeric (QC TUMERIC COMPLEX PO) Take 1,000 mg by mouth daily.   Yes [provider]  esomeprazole (NEXIUM) 20 MG capsule TAKE 1 CAPSULE BY MOUTH ONCE DAILY BEFORE BREAKFAST Patient not taking: Reported on 12/05/2021 01/27/20   Doree Albee, MD  Fluocinolone Acetonide 0.01 % SHAM Apply 2 tablespoons of shampoo to the scalp daily as needed for up to 2 weeks; leave shampoo on scalp for 5 minutes then rinse. Patient not taking: Reported on 12/05/2021 01/27/20   Doree Albee, MD  Fluticasone-Salmeterol  (ADVAIR) 250-50 MCG/DOSE AEPB Inhale 1 puff into the lungs daily. Patient not taking: Reported on 12/05/2021 01/27/20   Doree Albee, MD  furosemide (LASIX) 20 MG tablet TAKE 1 TABLET BY MOUTH TWICE A DAY Patient not taking: Reported on 12/05/2021 02/26/21   Arnoldo Lenis, MD    Current Facility-Administered Medications  Medication Dose Route Frequency Provider Last Rate Last Admin   acetaminophen (TYLENOL) tablet 650 mg  650 mg Oral Q6H PRN Emokpae, Ejiroghene E, MD       Or   acetaminophen (TYLENOL) suppository 650 mg  650 mg Rectal Q6H PRN Emokpae, Ejiroghene E, MD       dextrose 5 %-0.9 % sodium chloride infusion   Intravenous Continuous Irene Pap N, DO 100 mL/hr at 12/06/21 3329 Other (enter comment in med admin window) at 12/06/21 [id number]   DULoxetine (CYMBALTA) DR capsule 60 mg  60 mg Oral BID Emokpae, Ejiroghene E, MD       feeding supplement (BOOST / RESOURCE BREEZE) liquid 1 Container  1 Container  Oral TID BM Irene Pap N, DO       letrozole [facility]) tablet 2.5 mg  2.5 mg Oral Daily Emokpae, Ejiroghene E, MD       meropenem (MERREM) 1 g in sodium chloride 0.9 % 100 mL IVPB  1 g Intravenous Q12H Hall, Carole N, DO       metoprolol tartrate (LOPRESSOR) tablet 12.5 mg  12.5 mg Oral BID Hall, Carole N, DO       ondansetron (ZOFRAN) tablet 4 mg  4 mg Oral Q6H PRN Emokpae, Ejiroghene E, MD       Or   ondansetron (ZOFRAN) injection 4 mg  4 mg Intravenous Q6H PRN Emokpae, Ejiroghene E, MD   4 mg at 12/06/21 0217   pantoprazole (PROTONIX) EC tablet 40 mg  40 mg Oral Daily Emokpae, Ejiroghene E, MD       polyethylene glycol (MIRALAX / GLYCOLAX) packet 17 g  17 g Oral Daily PRN Emokpae, Ejiroghene E, MD       Rivaroxaban (XARELTO) tablet 15 mg  15 mg Oral Q supper Emokpae, Ejiroghene E, MD        Allergies as of 12/05/2021 - Review Complete 12/05/2021  Allergen Reaction Noted   Latex Itching and Rash 10/20/2013   Adhesive [tape] Other (See Comments) 10/20/2013   Betadine [povidone  iodine]  06/11/2017   Ciprofloxacin Nausea And Vomiting 10/20/2013   Codeine Nausea And Vomiting 10/20/2013   Penicillins Hives 10/20/2013   Sulfa antibiotics Hives 10/20/2013   Tramadol  07/24/2020   Wellbutrin [bupropion]  07/21/2017   Aloe Rash 06/24/2019    Past Medical History:  Diagnosis Date   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 on Xarelto for anticoagulation   Pseudoaneurysm ([place]) 09/2017   Stroke ([place])    Unspecified prolapse of vaginal walls     Past Surgical History:  Procedure Laterality Date   ABDOMINAL HYSTERECTOMY     COLONOSCOPY W/ BIOPSIES  02/01/2004   RMR: Anal papilla with normal rectum/Sigmoid diverticula/Small polyps in the cecum removed as described above. Inflammatory polyp   ESOPHAGEAL DILATION     unknown year per patient.   ESOPHAGEAL DILATION N/A 02/07/2015   RMR: Normal esophagus status post maloney dilation. Small hiatal hernia.    ESOPHAGOGASTRODUODENOSCOPY     unknown year per patient   ESOPHAGOGASTRODUODENOSCOPY N/A 02/07/2015   RMR: Normal esophagus  status post  Maloney dilation. Small hiatal hernia   FALSE ANEURYSM REPAIR Left 09/23/2017   Procedure: REPAIR FALSE ANEURYSM COMMON LEFT FEMORAL ARTERY WITH OSTEOTOMY OF BONE FRAGEMENT;  Surgeon: Angelia Mould, MD;  Location: [facility];  Service: Vascular;  Laterality: Left;   FEMUR IM NAIL Left 06/11/2017   Procedure: INTRAMEDULLARY (IM) NAIL FEMORAL;  Surgeon: Rod Can, MD;  Location: [facility];  Service: Orthopedics;  Laterality: Left;   LEG SURGERY Left    MASTECTOMY Right 09/2021   NASAL SEPTUM SURGERY     TONSILLECTOMY     WRIST FRACTURE SURGERY       Family History  Problem Relation Age of Onset   Heart disease  Mother    Stroke Mother    Emphysema Father    COPD Sister    Liver disease Brother    Colon polyps Neg Hx    Colon cancer Neg Hx     Social History   Socioeconomic History   Marital status: Married    Spouse name: Jeneen Rinks   Number of children: 3   Years of education: 12+   Highest education level: Some college, no degree  Occupational History   Not on file  Tobacco Use   Smoking status: Never   Smokeless tobacco: Never  Vaping Use   Vaping Use: Never used  Substance and Sexual Activity   Alcohol use: No    Alcohol/week: 0.0 standard drinks   Drug use: No   Sexual activity: Not Currently    Birth control/protection: Surgical    Comment: hysterectomy  Other Topics Concern   Not on file  Social History Narrative   Married for 21 years in second marriage.First marriage lasted 74 years.Lives with husband.   Social Determinants of Health   Financial Resource Strain: Not on file  Food Insecurity: Not on file  Transportation Needs: Not on file  Physical Activity: Not on file  Stress: Not on file  Social Connections: Not on file  Intimate Partner Violence: Not on file     Review of System:   General: Negative for  fever, chills, see hpi. Eyes: Negative for vision changes.  ENT: Negative for hoarseness, difficulty swallowing , nasal congestion. CV: Negative for chest pain, angina, palpitations, dyspnea on exertion, peripheral edema.  Respiratory: Negative for dyspnea at rest, dyspnea on exertion, +cough, sputum, wheezing.  GI: See history of present illness. GU:  Negative for dysuria, hematuria, urinary incontinence, urinary frequency, nocturnal urination.  MS: Negative for low back pain. Chronic right shoulder pain Derm: Negative for rash or itching.  Neuro: Negative for weakness, abnormal sensation, seizure, frequent headaches, memory loss, confusion.  Psych: Negative for anxiety, depression,  suicidal ideation, hallucinations.  Endo: Negative for unusual weight change.  Heme: Negative for bruising or bleeding. Allergy: Negative for rash or hives.      Physical Examination:   Vital signs in last 24 hours: Temp:  [98.2 F (36.8 C)-98.8 F (37.1 C)] 98.2 F (36.8 C) (02/23 0823) Pulse Rate:  [63-98] 72 (02/23 0823) Resp:  [13-24] 16 (02/23 0823) BP: (100-134)/(36-94) 112/48 (02/23 0823) SpO2:  [92 %-100 %] 94 % (02/23 0823) Weight:  [62.3 kg] 62.3 kg (02/22 1635)    General: frail, jaundiced, female in NAD. Son/daughter, husband at bedside.  Head: Normocephalic, atraumatic.   Eyes:  + icterus. Mouth: Oropharyngeal mucosa moist and pink , no lesions erythema or exudate. Neck: Supple without thyromegaly, masses, or lymphadenopathy.  Lungs: Clear to auscultation bilaterally.  Heart: Regular rate and rhythm, no murmurs rubs or gallops.  Abdomen: Bowel sounds are normal,  nondistended, no hepatosplenomegaly or masses, no abdominal bruits or    hernia , no rebound or guarding.  Mild ruq tenderness Rectal: not performed Extremities: No lower extremity edema, clubbing, deformity.  Neuro: Alert and oriented x 4 , grossly normal neurologically.  Skin: Warm and dry, no rash. + jaundice.   Psych: Alert and cooperative, normal mood and affect.        Intake/Output from previous day: 02/22 0701 - 02/23 0700 In: 654.8 [I.V.:481.4; IV Piggyback:173.3] Out: -  Intake/Output this shift: No intake/output data recorded.  Lab Results:   CBC Recent Labs    12/05/21 1736 12/06/21 0456  12/06/21 0855  WBC 20.5* 18.4*  --   HGB 10.7* 7.7* 7.4*  HCT 35.3* 25.5* 25.5*  MCV 75.6* 77.3*  --   PLT 320 252  --    BMET Recent Labs    12/05/21 1736 12/06/21 0456  NA 136 135  K 5.6* 4.7  CL 105 108  CO2 19* 18*  GLUCOSE 89 91  BUN 37* 29*  CREATININE 1.28* 1.01*  CALCIUM 9.8 8.8*   LFT Recent Labs    12/05/21 1736 12/06/21 0456  BILITOT 6.8* 6.2*  ALKPHOS 219* 162*   AST 81* 69*  ALT 45* 36  PROT 6.8 5.0*  ALBUMIN 2.7* 2.0*    Lipase No results for input(s): LIPASE in the last 72 hours.  PT/INR Recent Labs    12/05/21 1736 12/06/21 0456  LABPROT 32.7* 31.7*  INR 3.2* 3.1*      Imaging Studies:   CT ABDOMEN PELVIS W CONTRAST  Result Date: 12/06/2021 CLINICAL DATA:  Weakness and nausea since iron infusion last week. Generalized abdominal pain. EXAM: CT ABDOMEN AND PELVIS WITH CONTRAST TECHNIQUE: Multidetector CT imaging of the abdomen and pelvis was performed using the standard protocol following bolus administration of intravenous contrast. RADIATION DOSE REDUCTION: This exam was performed according to the departmental dose-optimization program which includes automated exposure control, adjustment of the mA and/or kV according to patient size and/or use of iterative reconstruction technique. CONTRAST:  165m OMNIPAQUE IOHEXOL 300 MG/ML  SOLN COMPARISON:  September 18, 2017. FINDINGS: Despite efforts by the technologist and patient, motion artifact is present on today's exam and could not be eliminated. This reduces exam sensitivity and specificity. Lower chest: Solid 2.9 cm right upper lobe perihilar pulmonary nodule on image 7/5. There are 2 nodules in the right lung base both of which measure 8 mm on image 13/5. Possible tiny bilateral pleural effusions. Calcifications of mitral annulus. Hepatobiliary: Innumerable bilobar hypodense hepatic lesions for instance a 4.3 x 2.7 cm lesion in the left lobe of the liver on image 21/2 and a 5.2 x 4.4 cm lesion in the right lobe of the liver on image 23/2. Nodular hepatic contour which may reflect cirrhosis. Gallbladder is decompressed however there is prominent wall thickening with trace pericholecystic fluid. No biliary ductal dilation. Pancreas: No pancreatic ductal dilation or evidence of acute inflammation. Spleen: No splenomegaly or focal splenic lesion. Adrenals/Urinary Tract: Bilateral adrenal glands appear  normal. No hydronephrosis. Symmetric enhancement and excretion of contrast from bilateral kidneys. Urinary bladder is unremarkable for degree of distension. Stomach/Bowel: No enteric contrast was administered. Stomach is predominantly decompressed limiting evaluation. No pathologic dilation of small or large bowel. No evidence of acute bowel inflammation. Vascular/Lymphatic: Aortic atherosclerosis without abdominal aortic aneurysm. No pathologically enlarged abdominal or pelvic lymph nodes. Reproductive: Pessary device in the vagina. No suspicious adnexal mass. Other: Trace abdominopelvic free fluid.  No pneumoperitoneum. Musculoskeletal: Diffuse demineralization of bone. Dextroconvex curvature of the thoracolumbar spine. Degenerative changes bilateral hips. Partially visualized left femoral fixation hardware. No aggressive lytic or blastic lesion of bone. IMPRESSION: Examination is significantly degraded by motion. Within this context: 1. Innumerable bilobar hypodense hepatic lesions, highly suspicious for metastatic disease. 2. Solid right-sided pulmonary nodules, also suspicious for metastatic disease. 3. Nodular hepatic contour which may reflect cirrhosis or sequela of metastatic disease. 4. Gallbladder is decompressed with prominent wall thickening and trace pericholecystic fluid, appearance which is most commonly associated with chronic hepatocellular disease. If concern for acute cholecystitis consider further evaluation with nuclear medicine HIDA scan. 5. Trace  abdominopelvic free fluid. 6.  Aortic Atherosclerosis (ICD10-I70.0). * onc * Electronically Signed   By: Dahlia Bailiff M.D.   On: 12/06/2021 10:30   DG Chest Port 1 View  Result Date: 12/05/2021 CLINICAL DATA:  A 88 year old female presents with questionable sepsis. EXAM: PORTABLE CHEST 1 VIEW COMPARISON:  Comparison is made with chest x-ray from December of 2018 and chest CT from 2017. FINDINGS: The images rotated to the RIGHT. Accounting for  this cardiomediastinal contours and hilar structures are stable. Lungs are clear aside from nodular changes in the area of the RIGHT infrahilar region/middle lobe just over the RIGHT hemidiaphragm. In the interval there been changes of RIGHT mastectomy. No lobar consolidation or effusion. Signs of prior rib fracture along the posterior LEFT chest, upper chest. Osteopenia. On limited assessment there is no acute skeletal process. IMPRESSION: 1. Persistent nodule and or mass in the RIGHT infrahilar region is suggested on the current radiograph. This was noted on prior imaging studies dating back to 2018 with no recent imaging for comparison. Correlate with any prior imaging or history to determine whether this has been addressed. 2. RIGHT mastectomy changes with surgical clips over the RIGHT chest and breast. 3. No acute cardiopulmonary disease. Electronically Signed   By: Zetta Bills M.D.   On: 12/05/2021 17:58  [4 week]  Assessment:   88 year old female with history of A-fib on Xarelto, prior stroke, prior DVT, recent diagnosis of breast cancer status post right mastectomy in December 2022 presenting with progressive generalized weakness, nausea/dry heaves, jaundice.  Progressive weakness of the past month, symptoms worse the past week.  Notes that she did well with her hysterectomy in December, recovered to near baseline, did contract COVID after her surgery.  GI consulted for jaundice with concern for biliary obstruction.  Jaundice: New onset of symptoms over the past few weeks.  4 weeks ago her total bilirubin was normal although her alkaline phosphatase was up to 187, AST 45 at the time.  Presenting this admission with a bilirubin of 6.8 as well as elevated alkaline phosphatase and transaminases.  CT imaging concerning for metastatic disease to the liver.  Also with nodular hepatic contour of the liver.  Gallbladder decompressed but prominent wall thickening with trace pericholecystic fluid.  No biliary  ductal dilation.  Pancreas unremarkable.  Suspect obstructive jaundice due to tumor burden.  Less likely due to infectious etiology.  Unfortunately ERCP with stenting not appropriate in the setting.  Given her age and fragility, palliative care consult appropriate to determine patient/family goals. Unclear if metastatic liver disease due to other primary malignancy or breast cancer.   Review of records including care everywhere, no recent imaging available to determine any evidence of metastatic disease at time of diagnosis.  She had negative margins and limited axillary dissection during mastectomy.  She does have a history of abnormal right lung on prior imaging. Goes back way back even prior to 2016 Chest CT. More recent studies: Chest x-ray from 2018 with right middle lobe lung mass.  Prior CT imaging from 2018 showing chronic right middle lobe opacity with air bronchograms may reflect area of chronic atelectasis.  Chest x-ray this admission with nodular changes in the area of right infrahilar region/middle lobe just over the right hemidiaphragm.  On CT this admission solid 2.9 cm right upper lobe perihilar pulmonary nodule.  There are 2 nodules in the right lung base both of which measure 8 mm.    UTI: Follow-up pending culture.  Patient currently on  meropenem.  Plan:   Recommend palliative consult as planned to help establish goals. Would benefit from oncology consult to help establish goals given concern for metastatic disease. Management of UTI per attending. Continue supportive measures, for nausea, etc. No indication for ERCP with stenting.  Will sign off, please call with question.     LOS: 1 day   We would like to thank you for the opportunity to participate in the care of Izabell L Poblete.  Laureen Ochs. Bernarda Caffey [facility] [phone] 2/23/202310:58 AM

## 2021-12-06 NOTE — Progress Notes (Addendum)
Initial Nutrition Assessment  DOCUMENTATION CODES:   Not applicable  INTERVENTION:  - Encourage PO intake - Recommend dysphagia 3 diet, to provide soft textures d/t history of dysphagia and family report; spoke with MD who agrees - Boost Breeze po TID, each supplement provides 250 kcal and 9 grams of protein - 2PM and 8PM snack to optimize nutritional intake  NUTRITION DIAGNOSIS:   Increased nutrient needs related to acute illness as evidenced by estimated needs.  GOAL:   Patient will meet greater than or equal to 90% of their needs  MONITOR:   PO intake, Supplement acceptance, Labs  REASON FOR ASSESSMENT:   Malnutrition Screening Tool    ASSESSMENT:   Pt admitted from home with generalized weakness, nausea and dry heaving. PMH includes CVA, afib, HTN and breast cancer.  2/23 CT abdomen/pelvis: hepatic lesions, R sided pulmonary nodules, hepatic contour noted, reflects suspicion for metastatic disease  RD working remotely. Spoke with pt's daughter, Rosann Auerbach, over the phone as pt is getting CT scan done. She states that for the past week, pt has been eating </=50% of her normal intake. She typically eats 3 meals per day with snacks but has only been taking bites at each meal d/t nausea. Pt has a h/o dysphagia and usually eats a soft textured diet. At home, she does not drink Ensure or Boost products as she feels like these milky type supplements make her sick. They are agreeable to try Boost Breeze during admission and the addition of snacks daily as pt is used to snacks at home.   [place], club crackers, jello, bananas,   Pt's daughter is unsure of usual weight, however suspects she has had recent weight loss and reports her current weight as 139 lbs. Per review of chart, pt noted to have decline in weight over the past year, suspect this could be d/t diagnosis of breast cancer and cancer related treatments. Noted a weight loss of 13% since April 2022.   Suspect a degree of  malnutrition present, however unable to confirm at this time. Will continue to follow up with pt and reassess as able.   Medications: protonix IV drips: D5 in NaCl @100mL /hr, merrem  Labs: BUN 29, Cr 1.01, corrected calcium 10.4, AST 69  NUTRITION - FOCUSED PHYSICAL EXAM: RD working remotely- deferred to f/u  Diet Order:   Diet Order             DIET DYS 3 Room service appropriate? Yes; Fluid consistency: Thin  Diet effective now                   EDUCATION NEEDS:   No education needs have been identified at this time  Skin:  Skin Assessment: Reviewed RN Assessment  Last BM:  PTA  Height:   Ht Readings from Last 1 Encounters:  12/05/21 5\' 2"  (1.575 m)    Weight:   Wt Readings from Last 1 Encounters:  12/05/21 62.3 kg   BMI:  Body mass index is 25.13 kg/m.  Estimated Nutritional Needs:   Kcal:  1500-1700  Protein:  75-90g  Fluid:  >/=1.5L  Clayborne Dana, RDN, LDN Clinical Nutrition

## 2021-12-06 NOTE — Progress Notes (Signed)
Pharmacy Antibiotic Note  Sara Garcia is a 88 y.o. female admitted on 12/05/2021 with  intra-abdominal infection .  Pharmacy has been consulted for meropenem dosing.  Plan: Meropenem 1000 mg IV every 12 hours. Monitor labs, c/s, and patient improvement.  Height: 5\' 2"  (157.5 cm) Weight: 62.3 kg (137 lb 6.4 oz) IBW/kg (Calculated) : 50.1  Temp (24hrs), Avg:98.6 F (37 C), Min:98.3 F (36.8 C), Max:98.8 F (37.1 C)  Recent Labs  Lab 12/05/21 1736 12/05/21 1917 12/05/21 2236 12/06/21 0044 12/06/21 0456  WBC 20.5*  --   --   --  18.4*  CREATININE 1.28*  --   --   --  1.01*  LATICACIDVEN 3.6* 3.9* 3.9* 3.3*  --     Estimated Creatinine Clearance: 31.5 mL/min (A) (by C-G formula based on SCr of 1.01 mg/dL (H)).    Allergies  Allergen Reactions   Latex Itching and Rash   Adhesive [Tape] Other (See Comments)    Tears skin   Betadine [Povidone Iodine]     blisters   Ciprofloxacin Nausea And Vomiting   Codeine Nausea And Vomiting    Upsets stomach, nightmares   Penicillins Hives    Has patient had a PCN reaction causing immediate rash, facial/tongue/throat swelling, SOB or lightheadedness with hypotension: yes Has patient had a PCN reaction causing severe rash involving mucus membranes or skin necrosis: yes Has patient had a PCN reaction that required hospitalization: no Has patient had a PCN reaction occurring within the last 10 years: no If all of the above answers are "NO", then may proceed with Cephalosporin use.    Sulfa Antibiotics Hives   Tramadol    Wellbutrin [Bupropion]     Reaction unknown   Aloe Rash    Antimicrobials this admission: Meropenem 2/23 >> CTX 2/23 Cefepime/vanco/flagyl 2/22 x 1   Microbiology results: 2/22 BCx: pending 2/22 UCx: pending   Thank you for allowing pharmacy to be a part of this patients care.  Margot Ables, PharmD Clinical Pharmacist 12/06/2021 8:02 AM

## 2021-12-06 NOTE — Consult Note (Signed)
Palliative Care Consult Note                                  Date: 12/06/2021   Patient Name: Sara Garcia  DOB: [birth year]  MRN: [id number]  Age / Sex: 88 y.o., female  PCP: Doree Albee, MD (Inactive) Referring Physician: Kayleen Memos, DO  Reason for Consultation: Establishing goals of care  HPI/Patient Profile: 88 y.o. female  with past medical history of CVA, atrial fibrillation, hypertension, recently diagnosed 3 months ago with R breast cancer, estrogen receptor positive. She presented to the ED with complaints of generalized weakness, nausea, dry heaving.  Work-up revealed presumptive UTI, AKI, acute transaminitis, elevated INR, presumptive biliary obstruction, acute blood loss anemia, concern for acute cholecystitis.  She was admitted on 12/05/2021.  Further work-up since hospitalization includes CT of the abdomen and pelvis today that demonstrated concern for metastatic disease in the lungs and liver.  Liver tumor burden likely responsible for transaminitis although no obvious CBD stricture and therefore no stent option per GI.  PMT was consulted for goals of care conversations.  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  I  met with the patient and her family at the bedside.  Family present included [name] (daughter), Audry Pili (son), and Laverna Peace (husband).   Concept of Palliative Care was introduced as specialized medical care for people and their families living with serious illness.  If focuses on providing relief from the symptoms and stress of a serious illness.  The goal is to improve quality of life for both the patient and the family. Values and goals of care important to patient and family were attempted to be elicited.  Created space and opportunity for patient  and family to explore thoughts and feelings regarding current medical situation   Natural trajectory and current clinical status were discussed. Questions and concerns addressed. Patient  encouraged to call with questions or concerns.    Patient/Family Understanding of Illness: They understand she has lesions in her liver and lungs that look like cancer.  They know she is jaundiced.  We discussed she was diagnosed with breast cancer sometime in October or November and had a mastectomy with lymph node removal in December.  They stated her lymph nodes were "clean" with no obvious spread.  She did well postop and is now on a hormonal oral medication for her cancer.  There is no need for chemotherapy or radiation.  However, they state that they do not think that she would have tolerated it anyway.  Her blood work indicates that her cancer is potentially aggressive.  They state  that she is of sound mind and makes her own decisions.  We had an extensive discussion between myself, GI providers, patient, family about her current clinical status.  Her oncologist is in [place].  We discussed that although cancer cannot be proven without a biopsy that all things are pointing towards likely cancer spread, possible breast cancer with metastasis.  We discussed the difficulties of chemotherapy and radiation.  We discussed that advanced age and poor functional status often preclude  hemotherapy and radiation as treatment options.  However, an oncology opinion is certainly available if desired.  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  She has been married for 23 years to her husband Laverna Peace, although they dated for 20 years before that.  She enjoys going to church, and identifies at the [employer].  She enjoys traveling and spent many years traveling all around the [place].  She also enjoys flowers and looking at the birds in her garden.  She currently lives in [place], [place] with her husband.  Her son and daughter live close and her other daughter lives in [place].  Patient Values: Family, faith, quality  Goals: To get oncology opinion and know what options are available, if any  Today's Discussion: In addition to the discussion about her life and current clinical status we had a prolonged discussion broaching many areas including things of quality, discussion of the wide range of possible options including full/aggressive care all the way to comfort focus.  We discussed that we can get an oncology opinion but that her son expressed in all likelihood he understands that she is not likely a candidate for chemotherapy or radiation.  At that point he asks what her options would be and we discussed that we could look at more comfort/hospice approach in order to maximize quality, dignity, comfort.  They understand there is a very good chance that there are no treatment options available and that the patient may be facing an end-of-life situation.  The patient seems to understand this as well.  I provided emotional general support through therapeutic listening, therapeutic silence, empathy, therapeutic touch, sharing of stories, and laughter.  I answered all questions and addressed all concerns to the best of my ability.  I provided a contact card to all members present with the palliative medicine team contact  information.  I also provided a couple copies of the book "difficult choices for loving people" for them to read about possible decisions that may need to be made in the near future.  We discussed that goals of care conversations are ongoing and not a single episode.  We can help support and guide their decision making as we reach decision points.  We agreed to touch base again tomorrow after this information has had a chance to sink in.  Review of Systems  Constitutional:  Positive for fatigue.  Gastrointestinal:  Negative for abdominal pain.       N/V improved  Musculoskeletal:        Right shoulder pain, mild   Objective:   Primary Diagnoses: Present on Admission:  Atrial fibrillation ([place])  Essential hypertension  UTI (urinary tract infection)  AKI (acute kidney injury) ([place])   Physical Exam Vitals and nursing note reviewed.  Constitutional:      Appearance: She is ill-appearing. She is not toxic-appearing.  HENT:     Head: Normocephalic and atraumatic.  Cardiovascular:     Rate and Rhythm: Normal rate.  Pulmonary:     Effort: Pulmonary effort is normal. No respiratory distress.     Breath sounds: No wheezing or rhonchi.  Abdominal:     General: Abdomen is flat.     Palpations: Abdomen is soft.  Skin:    General: Skin is warm and dry.     Coloration: Skin is jaundiced (mild).  Neurological:     General: No focal deficit present.     Mental Status: She is alert.  Psychiatric:        Mood and Affect: Mood normal.        Behavior: Behavior normal.    Vital Signs:  BP (!) 122/54 (BP Location: Left Arm)    Pulse 78    Temp 98.2 F (36.8 C) (Oral)    Resp 16    Ht _0  (1.575 m)    Wt 62.3 kg    SpO2 94%    BMI 25.13 kg/m   Palliative Assessment/Data: 30-40%    Advanced Care Planning:   Primary Decision Maker: PATIENT  Code Status/Advance Care Planning: DNR  A discussion was had today regarding advanced directives. Concepts specific to code status,  artifical feeding and hydration, continued IV antibiotics and rehospitalization was had.  The difference between a aggressive medical intervention path and a palliative comfort care path for this patient at this time was had.   Decisions/Changes to ACP: None today  Assessment & Plan:   Impression: 88 year old female who presents for clinical situations as described above.  Currently being treated for UTI.  Imaging, unfortunately, has revealed likely metastatic disease with significant tumor burden in her both her lungs and liver.  Recently diagnosed and underwent mastectomy for aggressive breast cancer.  We discussed that there is a chance there may not be any treatment options.  No specific obstruction of the CBD and subsequently no biliary stent option for her transaminitis/jaundice.  Family and patient seem to expect that in all likelihood limited options.  However, they agree they would like an oncologist opinion.  Regardless, I feel they are likely leaning toward comfort approach/hospice services.  SUMMARY OF RECOMMENDATIONS   Continue to treat the treatable Remain DNR Oncology involvement for opinion about possible treatment options, if any Further GOC discussions PMT will continue to follow  Symptom Management:  Added liquid Tylenol syrup and discontinue Tylenol pills due to patient difficulty with pills Otherwise per primary team PMT is available to assist as needed  Prognosis:  < 6 months  Discharge Planning:  To Be Determined   Discussed with: Patient, family, medical team, nursing team    Thank you for allowing [place] to participate in the care of Keali Mccraw Holland PMT will continue to support holistically.  Time Total: 120 min  Greater than 50%  of this time was spent counseling and coordinating care related to the above assessment and plan.  Signed by: Walden Field, NP Palliative Medicine Team  Team Phone # [phone] (Nights/Weekends)  12/06/2021, 4:02 PM

## 2021-12-06 NOTE — Plan of Care (Signed)

## 2021-12-07 LAB — CBC
HCT: 31.9 % — ABNORMAL LOW (ref 36.0–46.0)
Hemoglobin: 9.3 g/dL — ABNORMAL LOW (ref 12.0–15.0)
MCH: 23.3 pg — ABNORMAL LOW (ref 26.0–34.0)
MCHC: 29.2 g/dL — ABNORMAL LOW (ref 30.0–36.0)
MCV: 79.8 fL — ABNORMAL LOW (ref 80.0–100.0)
Platelets: 231 10*3/uL (ref 150–400)
RBC: 4 MIL/uL (ref 3.87–5.11)
RDW: 32.6 % — ABNORMAL HIGH (ref 11.5–15.5)
WBC: 15.6 10*3/uL — ABNORMAL HIGH (ref 4.0–10.5)
nRBC: 0.2 % (ref 0.0–0.2)

## 2021-12-07 LAB — TYPE AND SCREEN
ABO/RH(D): O NEG
Antibody Screen: NEGATIVE
Unit division: 0

## 2021-12-07 LAB — COMPREHENSIVE METABOLIC PANEL
ALT: 36 U/L (ref 0–44)
AST: 80 U/L — ABNORMAL HIGH (ref 15–41)
Albumin: 1.9 g/dL — ABNORMAL LOW (ref 3.5–5.0)
Alkaline Phosphatase: 166 U/L — ABNORMAL HIGH (ref 38–126)
Anion gap: 8 (ref 5–15)
BUN: 21 mg/dL (ref 8–23)
CO2: 20 mmol/L — ABNORMAL LOW (ref 22–32)
Calcium: 8.7 mg/dL — ABNORMAL LOW (ref 8.9–10.3)
Chloride: 110 mmol/L (ref 98–111)
Creatinine, Ser: 0.81 mg/dL (ref 0.44–1.00)
GFR, Estimated: >60 mL/min (ref >60)
Glucose, Bld: 98 mg/dL (ref 70–99)
Potassium: 3.8 mmol/L (ref 3.5–5.1)
Sodium: 138 mmol/L (ref 135–145)
Total Bilirubin: 8.8 mg/dL — ABNORMAL HIGH (ref 0.3–1.2)
Total Protein: 5.2 g/dL — ABNORMAL LOW (ref 6.5–8.1)

## 2021-12-07 LAB — BPAM RBC
Blood Product Expiration Date: 202303132359
ISSUE DATE / TIME: 202302231711
Unit Type and Rh: 9500

## 2021-12-07 LAB — PHOSPHORUS: Phosphorus: 2.1 mg/dL — ABNORMAL LOW (ref 2.5–4.6)

## 2021-12-07 LAB — MAGNESIUM: Magnesium: 1.7 mg/dL (ref 1.7–2.4)

## 2021-12-07 LAB — LACTIC ACID, PLASMA: Lactic Acid, Venous: 3.2 mmol/L (ref 0.5–1.9)

## 2021-12-07 MED ORDER — OXYCODONE HCL 5 MG PO TABS: 5.0000 mg | ORAL_TABLET | Freq: Four times a day (QID) | ORAL | 0 refills | Status: AC | PRN

## 2021-12-07 MED ORDER — PANTOPRAZOLE SODIUM 40 MG PO TBEC
40.0000 mg | DELAYED_RELEASE_TABLET | Freq: Every day | ORAL | Status: DC
  Administered 2021-12-07 – 2021-12-08 (×2): 40 mg via ORAL
  Filled 2021-12-07 (×2): qty 1

## 2021-12-07 MED ORDER — METRONIDAZOLE 500 MG PO TABS: 500.0000 mg | ORAL_TABLET | Freq: Two times a day (BID) | ORAL | 0 refills | Status: AC

## 2021-12-07 MED ORDER — ONDANSETRON 4 MG PO TBDP: 4.0000 mg | ORAL_TABLET | Freq: Three times a day (TID) | ORAL | Status: DC | PRN

## 2021-12-07 MED ORDER — CIPROFLOXACIN HCL 250 MG PO TABS
500.0000 mg | ORAL_TABLET | Freq: Two times a day (BID) | ORAL | Status: DC
  Administered 2021-12-08: 500 mg via ORAL
  Filled 2021-12-07 (×2): qty 2

## 2021-12-07 MED ORDER — METRONIDAZOLE 500 MG PO TABS
500.0000 mg | ORAL_TABLET | Freq: Two times a day (BID) | ORAL | Status: DC
  Administered 2021-12-07 – 2021-12-08 (×2): 500 mg via ORAL
  Filled 2021-12-07 (×2): qty 1

## 2021-12-07 MED ORDER — LORAZEPAM 0.5 MG PO TABS: 0.5000 mg | ORAL_TABLET | ORAL | Status: DC | PRN

## 2021-12-07 MED ORDER — CIPROFLOXACIN HCL 500 MG PO TABS
500.0000 mg | ORAL_TABLET | Freq: Two times a day (BID) | ORAL | 0 refills | Status: AC
Start: 2021-12-07 — End: 2021-12-14

## 2021-12-07 MED ORDER — LORAZEPAM 0.5 MG PO TABS
0.5000 mg | ORAL_TABLET | Freq: Four times a day (QID) | ORAL | 0 refills | Status: AC | PRN
Start: 2021-12-07 — End: 2021-12-10

## 2021-12-07 MED ORDER — ONDANSETRON 4 MG PO TBDP: 4.0000 mg | ORAL_TABLET | Freq: Three times a day (TID) | ORAL | 0 refills | Status: AC | PRN

## 2021-12-07 MED ORDER — METOPROLOL TARTRATE 25 MG PO TABS: 12.5000 mg | ORAL_TABLET | Freq: Two times a day (BID) | ORAL | 0 refills | Status: AC

## 2021-12-07 MED ORDER — MORPHINE SULFATE (CONCENTRATE) 10 MG/0.5ML PO SOLN: 10.0000 mg | ORAL | 0 refills | Status: DC | PRN

## 2021-12-07 MED ORDER — MORPHINE SULFATE (CONCENTRATE) 10 MG/0.5ML PO SOLN
10.0000 mg | ORAL | Status: DC | PRN
  Administered 2021-12-08: 10 mg via ORAL
  Filled 2021-12-07: qty 0.5

## 2021-12-07 NOTE — TOC Transition Note (Addendum)
Transition of Care [facility]) - CM/SW Discharge Note   Patient Details  Name: Sara Garcia MRN: [id number] Date of Birth: 06/03/1928  Transition [facility] CM/SW Contact:  Boneta Lucks, RN Phone Number: 12/07/2021, 11:28 AM   Clinical Narrative:   Palliative consulted TOC to set up home with hospice. TOC spoke with [name]. They requested [facility] hospice. They need a hospital bed and wheel chair. Referral sent. TOC updated [name] with equipment needs. Hospice will contact family and give TOC an ETA on delivery.  MD updated to send scripts to [facility].   Addendum: Hospice called, delivery set for 3 to 5PM. Nurse updated, Family is in the room, they will update nurse when it arrives at home and plan transportation.   Final next level of care: Home w Hospice Care Barriers to Discharge: Barriers Resolved  Patient Goals and CMS Choice Patient states their goals for this hospitalization and ongoing recovery are:: to go home. CMS Medicare.gov Compare Post Acute Care list provided to:: Patient Represenative (must comment) Choice offered to / list presented to : Adult Children  Discharge Placement    Name of family member notified: [name] Patient and family notified of of transfer: 12/07/21  Discharge Plan and [place] with Hospice    Readmission Risk Interventions Readmission Risk Prevention Plan 12/07/2021  Transportation Screening Complete  PCP or Specialist Appt within 5-7 Days (No Data)  Home Care Screening Complete  Medication Review (RN CM) Complete  Some recent data might be hidden

## 2021-12-07 NOTE — Progress Notes (Signed)
Patient has scheduled Cipro due. Patient refusing medication. Patient stated she is allergic to Cipro because it causes nausea and vomiting. Dr. Josephine Cables made aware. No new orders at this time.

## 2021-12-07 NOTE — Progress Notes (Signed)
Nsg Discharge Note  Admit Date:  12/05/2021 Discharge date: 12/07/2021   Sara Garcia to be D/C'd Home per MD order.  AVS completed.  Copy for chart, and copy for patient signed, and dated. Patient/caregiver able to verbalize understanding.  Discharge Medication: Allergies as of 12/07/2021       Reactions   Latex Itching, Rash   Adhesive [tape] Other (See Comments)   Tears skin   Betadine [povidone Iodine]    blisters   Ciprofloxacin Nausea And Vomiting   Codeine Nausea And Vomiting   Upsets stomach, nightmares   Penicillins Hives   Has patient had a PCN reaction causing immediate rash, facial/tongue/throat swelling, SOB or lightheadedness with hypotension: yes Has patient had a PCN reaction causing severe rash involving mucus membranes or skin necrosis: yes Has patient had a PCN reaction that required hospitalization: no Has patient had a PCN reaction occurring within the last 10 years: no If all of the above answers are "NO", then may proceed with Cephalosporin use.   Sulfa Antibiotics Hives   Tramadol    Wellbutrin [bupropion]    Reaction unknown   Aloe Rash        Medication List     STOP taking these medications    diltiazem 60 MG 12 hr capsule Commonly known as: CARDIZEM SR   esomeprazole 20 MG capsule Commonly known as: NEXIUM   Fluocinolone Acetonide 0.01 % Sham   Fluticasone-Salmeterol 250-50 MCG/DOSE Aepb Commonly known as: ADVAIR   furosemide 20 MG tablet Commonly known as: LASIX   Rivaroxaban 15 MG Tabs tablet Commonly known as: XARELTO       TAKE these medications    albuterol 108 (90 Base) MCG/ACT inhaler Commonly known as: VENTOLIN HFA Inhale 1-2 puffs into the lungs every 6 (six) hours as needed for shortness of breath.   cholecalciferol 1000 units tablet Commonly known as: VITAMIN D Take 3,000 Units by mouth 2 (two) times daily.   ciprofloxacin 500 MG tablet Commonly known as: CIPRO Take 1 tablet (500 mg total) by mouth 2 (two) times  daily for 7 days.   [employer] VITAMIN B12 1000 MCG tablet Generic drug: cyanocobalamin TAKE 1 TABLET BY MOUTH EVERY DAY   diclofenac 0.1 % ophthalmic solution Commonly known as: VOLTAREN 4 (four) times daily.   DULoxetine 60 MG capsule Commonly known as: CYMBALTA TAKE 1 CAPSULE BY MOUTH TWICE A DAY   Klor-Con M20 20 MEQ tablet Generic drug: potassium chloride SA TAKE 1 TABLET BY MOUTH TWICE A DAY   letrozole 2.5 MG tablet Commonly known as: FEMARA Take 2.5 mg by mouth daily.   LORazepam 0.5 MG tablet Commonly known as: ATIVAN Take 1 tablet (0.5 mg total) by mouth every 6 (six) hours as needed for up to 3 days for anxiety.   metoprolol tartrate 25 MG tablet Commonly known as: LOPRESSOR Take 0.5 tablets (12.5 mg total) by mouth 2 (two) times daily. What changed: how much to take   metroNIDAZOLE 500 MG tablet Commonly known as: FLAGYL Take 1 tablet (500 mg total) by mouth every 12 (twelve) hours for 7 days.   NON FORMULARY Take 2 tablets by mouth 2 (two) times daily at 10 am and 4 pm. CBD gummies; 2 chews 2 times a day.   NP Thyroid 60 MG tablet Generic drug: thyroid TAKE 1 TABLET BY MOUTH EVERY DAY   omeprazole 20 MG capsule Commonly known as: PRILOSEC Take by mouth.   ondansetron 4 MG disintegrating tablet Commonly known as: ZOFRAN-ODT  Take 1 tablet (4 mg total) by mouth every 8 (eight) hours as needed for nausea or vomiting.   oxyCODONE 5 MG immediate release tablet Commonly known as: Roxicodone Take 1 tablet (5 mg total) by mouth every 6 (six) hours as needed for up to 3 days for severe pain.   polyethylene glycol powder 17 GM/SCOOP powder Commonly known as: GLYCOLAX/MIRALAX Take 17 g by mouth daily.   QC TUMERIC COMPLEX PO Take 1,000 mg by mouth daily.               Durable Medical Equipment  (From admission, onward)           Start     Ordered   12/07/21 1324  For home use only DME Hospital bed  Once       Question Answer Comment  Length of  Need 6 Months   The above medical condition requires: Patient requires the ability to reposition frequently   Head must be elevated greater than: 30 degrees   Bed type Semi-electric   Support Surface: Gel Overlay      12/07/21 1323            Discharge Assessment: Vitals:   12/07/21 0522 12/07/21 1312  BP: (!) 132/52 136/65  Pulse: 91 93  Resp: 16 16  Temp: 98.7 F (37.1 C)   SpO2: 91% 95%   Skin clean, dry and intact without evidence of skin break down, no evidence of skin tears noted. IV catheter discontinued intact. Site without signs and symptoms of complications - no redness or edema noted at insertion site, patient denies c/o pain - only slight tenderness at site.  Dressing with slight pressure applied.  D/c Instructions-Education: Discharge instructions given to patient/family with verbalized understanding. D/c education completed with patient/family including follow up instructions, medication list, d/c activities limitations if indicated, with other d/c instructions as indicated by MD - patient able to verbalize understanding, all questions fully answered. Patient instructed to return to ED, call 911, or call MD for any changes in condition.  Patient escorted via [place], and D/C home via private auto.  Dorcas Mcmurray, RN 12/07/2021 6:39 PM

## 2021-12-07 NOTE — Progress Notes (Signed)
Daily Progress Note   Patient Name: Sara Garcia       Date: 12/07/2021 DOB: 1928-04-28  Age: 88 y.o. MRN#: [id number] Attending Physician: Sara Memos, DO Primary Care Physician: Sara Albee, MD (Inactive) Admit Date: 12/05/2021 Length of Stay: 2 days  Reason for Consultation/Follow-up: Establishing goals of care  HPI/Patient Profile:  88 y.o. female  with past medical history of CVA, atrial fibrillation, hypertension, recently diagnosed 3 months ago with R breast cancer, estrogen receptor positive. She presented to the ED with complaints of generalized weakness, nausea, dry heaving.  Work-up revealed presumptive UTI, AKI, acute transaminitis, elevated INR, presumptive biliary obstruction, acute blood loss anemia, concern for acute cholecystitis.  She was admitted on 12/05/2021.   Further work-up since hospitalization includes CT of the abdomen and pelvis today that demonstrated concern for metastatic disease in the lungs and liver.  Liver tumor burden likely responsible for transaminitis although no obvious CBD stricture and therefore no stent option per GI.   2/23 pt seen by oncology as IP. No available treatment options, recommended hospice.  Subjective:   Subjective: Chart Reviewed. Updates received. Patient Assessed. Created space and opportunity for patient  and family to explore thoughts and feelings regarding current medical situation.  Today's Discussion: Today I met with the patient and her youngest daughter at the bedside. We were later joined near the end of the visit by her son in law and her husband. We reviewed oncology visit yesterday and that there are no treatment options left. We had a long discussion on the hospice philosophy and the benefits to shifting to a comfort approach. She accepts shes nearing end of life. She seems to have a peace about this stating "God has been good to me, I'm not in pain." She had a long discussion yesterday with her children and is happy  they are all "saved" and this brings her peace. She accepts the undeniable truth of human mortality and if it's her time and God is calling her home, she will go." We discussed hospice as acceping some things are not curable and we are all going to die one day, but we can have control on how those last days, weeks, and months play out. Discussed focus on comfort, dignity, and quality. She is in agreement as is her family.  She has given permission to consult hospice to set-up services and is happy she may get to go home soon.  I provided emotional and general support through therapeutic listening, therapeutic touch, therapeutic silence, sharing of stories, empathy, and other techniques. I answered all questions and addressed all concerns to the best of my ability.  Review of Systems  Constitutional:  Positive for fatigue.  Gastrointestinal:  Negative for abdominal pain, nausea and vomiting.   Objective:   Vital Signs:  BP (!) 132/52 (BP Location: Left Arm)    Pulse 91    Temp 98.7 F (37.1 C)    Resp 16    Ht 5' 2"  (1.575 m)    Wt 62.3 kg    SpO2 91%    BMI 25.13 kg/m   Physical Exam: Physical Exam Vitals and nursing note reviewed.  Constitutional:      Appearance: She is ill-appearing. She is not toxic-appearing.  HENT:     Head: Normocephalic and atraumatic.  Pulmonary:     Effort: Pulmonary effort is normal. No respiratory distress.  Skin:    Coloration: Skin is jaundiced.  Neurological:     General: No focal  deficit present.     Mental Status: She is alert.  Psychiatric:        Mood and Affect: Mood normal.        Behavior: Behavior normal.    Palliative Assessment/Data: 40%   Assessment & Plan:   Impression: Present on Admission:  Atrial fibrillation ([place])  Essential hypertension  UTI (urinary tract infection)  AKI (acute kidney injury) ([place])  88 year old female who presents for clinical situations as described above.  Currently being treated for UTI.  Imaging,  unfortunately, has revealed likely metastatic disease with significant tumor burden in her both her lungs and liver.  Recently diagnosed and underwent mastectomy for aggressive breast cancer.  We discussed that there is a chance there may not be any treatment options.  No specific obstruction of the CBD and subsequently no biliary stent option for her transaminitis/jaundice.  Family and patient seem to expect that in all likelihood limited options.  Oncology consulted 2/23 who informed them there are no available treatment options and recommended hospice. Met with family who is in agreement, consult placed for home hospice.  SUMMARY OF RECOMMENDATIONS   Remain DNR Consult for home hospice of [place] d/c today or tomorrow on hospice PMT will follow peripherally, please let us know if further needs arise  Symptom Management:  Per primary team PMT is available to assist as needed  Code Status: DNR  Prognosis: < 6 months  Discharge Planning: Home with Hospice  Discussed with: Medical team, nursing team, [facility] team, patient, family  Thank you for allowing [place] to participate in the care of Sara Garcia PMT will continue to support holistically.  Time Total: 75 min  Visit consisted of counseling and education dealing with the complex and emotionally intense issues of symptom management and palliative care in the setting of serious and potentially life-threatening illness. Greater than 50%  of this time was spent counseling and coordinating care related to the above assessment and plan.  Sara Field, NP Palliative Medicine Team  Team Phone # [phone] (Nights/Weekends)  06/12/2021, 8:17 AM

## 2021-12-07 NOTE — Discharge Summary (Addendum)
Discharge Summary  Sara Garcia UYQ:[id number] DOB: Feb 19, 1928  PCP: Doree Albee, MD (Inactive)  Admit date: 12/05/2021 Discharge date: 12/07/2021  Time spent: 35 minutes.  Recommendations for Outpatient Follow-up:  Continue hospice care at home.  Discharge Diagnoses:  Active Hospital Problems   Diagnosis Date Noted   Generalized weakness 12/05/2021   Metastatic breast cancer (HCC)    Elevated liver enzymes 12/05/2021   AKI (acute kidney injury) ([place]) 12/05/2021   UTI (urinary tract infection) 12/02/2019   Chronic anticoagulation 06/27/2014   Atrial fibrillation ([place]) 01/10/2014   Essential hypertension 12/21/2013    Resolved Hospital Problems  No resolved problems to display.    Discharge Condition: Stable.  Diet recommendation: Pleasure feedings.  Vitals:   12/07/21 0522 12/07/21 1312  BP: (!) 132/52 136/65  Pulse: 91 93  Resp: 16 16  Temp: 98.7 F (37.1 C)   SpO2: 91% 95%    History of present illness:  Sara Garcia is a 88 y.o. female with medical history significant for CVA, atrial fibrillation, hypertension, recently diagnosed 3 months ago with R breast cancer, estrogen receptor positive.  Who presented to [facility] from home with reports of generalized weakness, nausea with dry heaving.  Onset post iron infusion on 11/30/2021.  Work-up revealed presumptive UTI, AKI, acute transaminitis, elevated INR, presumptive biliary obstruction, acute blood loss anemia, concern for acute cholecystitis.  Started on Rocephin, switch to Merrem to provide broader antibiotic spectrum.  GI consulted due to concern for biliary obstruction.  CT scan abdomen and pelvis done with contrast done on 12/06/2018 showed multiple liver lesions, right lung lesions with concern for metastatic disease.  1 unit PRBCs transfused for hemoglobin of 7.2K.  Xarelto DC'd and IV Protonix 40 mg twice daily started due to concern for upper GI bleed.    Patient was seen by GI, medical oncology, and palliative  care team, the prognosis is poor from oncology standpoint, due to concern for metastatic disease.  Patient made decision for hospice care.   12/07/2021: Patient was seen at her bedside.  Her daughter was present in the room.  She is resting peacefully, in no acute distress.  There were no acute events overnight.  No new complaints reported.  Hospital Course:  Principal Problem:   Generalized weakness Active Problems:   Essential hypertension   Atrial fibrillation (HCC)   Chronic anticoagulation   UTI (urinary tract infection)   Elevated liver enzymes   AKI (acute kidney injury) ([place])   Metastatic breast cancer (HCC)  Generalized weakness likely multifactorial Likely secondary to dehydration from poor oral intake, presumptive UTI, presumptive acute cholecystitis.  No focal deficits.   Significant lactic acidosis 3.6 > 3.9 after 1 L bolus.     Multiple liver lesions, right lung nodules with concern for malignancy and metastatic disease. Recently diagnosed with right breast cancer, 3 months ago. Palliative care team consulted to assist with goals of care discussions. Plan for discharge to home with hospice care, per patient's request.   Lactic acidosis, secondary to dehydration, presumptive UTI, presumptive acute cholecystitis. Defer decision to obtain HIDA scan to GI. Received 2 days of Merrem for presumptive acute cholecystitis. Switched to ciprofloxacin and p.o. Flagyl for DC planning on 12/07/2021.   Acute blood loss anemia with concern for possible upper GI bleed Presented with hemoglobin greater than 10, downtrending 7.4. 1 unit PRBC ordered to be transfused, keep hemoglobin above 8.0. Received IV Protonix 40 mg twice daily Hemoglobin 9.3 on 12/07/2018. Restart home PPI for  DC planning   Resolved AKI (acute kidney injury) ([place])- (present on admission) Creatinine 1.28, baseline 0.7-0.8.  Likely prerenal from poor oral intake. Creatinine 0.8 with GFR greater than 60. Continue to  encourage hydration as tolerated.   Elevated liver chemistries with concern for biliary obstruction ALP 219, ALT 45, AST 81, total bilirubin 6.8.  INR 3.2.   Continue to trend. Continue to avoid hepatotoxic agents  Hyperbilirubinemia/jaundice with concern for biliary obstruction. T bilirubin is up trending 8.8 from 6.2.   Paroxysmal atrial fibrillation ([place])- (present on admission) Currently in sinus rhythm.  On chronic anticoagulation with Xarelto.   Rate controlled on Cardizem and metoprolol.   Hold home Cardizem to avoid hypotension. Hold home Xarelto increased risk of bleeding Continue Lopressor to avoid beta-blocker withdrawal, reduce dose to 12.5 mg twice daily.   Essential hypertension- (present on admission) Continue to hold off on Cardizem Continue home metoprolol at lower doses 12.5 mg twice daily. Avoid hypotension due to concern for possible upper GI bleed  Goals of care DNR Plan to discharge to home with hospice care. Pain control Anxiety control   Code Status: DNR-  Consults called: GI, medical oncology, palliative care team.   Discharge Exam: BP 136/65 (BP Location: Left Arm)    Pulse 93    Temp 98.7 F (37.1 C)    Resp 16    Ht 5\' 2"  (1.575 m)    Wt 62.3 kg    SpO2 95%    BMI 25.13 kg/m  General: 88 y.o. year-old female well developed well nourished in no acute distress.  Somnolent but easily arousable to voices and oriented x3. Cardiovascular: Regular rate and rhythm with no rubs or gallops.  No thyromegaly or JVD noted.   Respiratory: Clear to auscultation with no wheezes or rales.  Abdomen: Soft right upper quadrant pain with palpation nondistended with normal bowel sounds x4 quadrants. Musculoskeletal: No lower extremity edema bilaterally. Skin: No ulcerative lesions noted or rashes Psychiatry: Mood is appropriate for condition and setting  Discharge Instructions You were cared for by a hospitalist during your hospital stay. If you have any questions  about your discharge medications or the care you received while you were in the hospital after you are discharged, you can call the unit and asked to speak with the hospitalist on call if the hospitalist that took care of you is not available. Once you are discharged, your primary care physician will handle any further medical issues. Please note that NO REFILLS for any discharge medications will be authorized once you are discharged, as it is imperative that you return to your primary care physician (or establish a relationship with a primary care physician if you do not have one) for your aftercare needs so that they can reassess your need for medications and monitor your lab values.   Allergies as of 12/07/2021       Reactions   Latex Itching, Rash   Adhesive [tape] Other (See Comments)   Tears skin   Betadine [povidone Iodine]    blisters   Ciprofloxacin Nausea And Vomiting   Codeine Nausea And Vomiting   Upsets stomach, nightmares   Penicillins Hives   Has patient had a PCN reaction causing immediate rash, facial/tongue/throat swelling, SOB or lightheadedness with hypotension: yes Has patient had a PCN reaction causing severe rash involving mucus membranes or skin necrosis: yes Has patient had a PCN reaction that required hospitalization: no Has patient had a PCN reaction occurring within the last 10 years:  no If all of the above answers are "NO", then may proceed with Cephalosporin use.   Sulfa Antibiotics Hives   Tramadol    Wellbutrin [bupropion]    Reaction unknown   Aloe Rash        Medication List     STOP taking these medications    diltiazem 60 MG 12 hr capsule Commonly known as: CARDIZEM SR   esomeprazole 20 MG capsule Commonly known as: NEXIUM   Fluocinolone Acetonide 0.01 % Sham   Fluticasone-Salmeterol 250-50 MCG/DOSE Aepb Commonly known as: ADVAIR   furosemide 20 MG tablet Commonly known as: LASIX   Rivaroxaban 15 MG Tabs tablet Commonly known as:  XARELTO       TAKE these medications    albuterol 108 (90 Base) MCG/ACT inhaler Commonly known as: VENTOLIN HFA Inhale 1-2 puffs into the lungs every 6 (six) hours as needed for shortness of breath.   cholecalciferol 1000 units tablet Commonly known as: VITAMIN D Take 3,000 Units by mouth 2 (two) times daily.   ciprofloxacin 500 MG tablet Commonly known as: CIPRO Take 1 tablet (500 mg total) by mouth 2 (two) times daily for 7 days.   [employer] VITAMIN B12 1000 MCG tablet Generic drug: cyanocobalamin TAKE 1 TABLET BY MOUTH EVERY DAY   diclofenac 0.1 % ophthalmic solution Commonly known as: VOLTAREN 4 (four) times daily.   DULoxetine 60 MG capsule Commonly known as: CYMBALTA TAKE 1 CAPSULE BY MOUTH TWICE A DAY   Klor-Con M20 20 MEQ tablet Generic drug: potassium chloride SA TAKE 1 TABLET BY MOUTH TWICE A DAY   letrozole 2.5 MG tablet Commonly known as: FEMARA Take 2.5 mg by mouth daily.   LORazepam 0.5 MG tablet Commonly known as: ATIVAN Take 1 tablet (0.5 mg total) by mouth every 6 (six) hours as needed for up to 3 days for anxiety.   metoprolol tartrate 25 MG tablet Commonly known as: LOPRESSOR Take 0.5 tablets (12.5 mg total) by mouth 2 (two) times daily. What changed: how much to take   metroNIDAZOLE 500 MG tablet Commonly known as: FLAGYL Take 1 tablet (500 mg total) by mouth every 12 (twelve) hours for 7 days.   NON FORMULARY Take 2 tablets by mouth 2 (two) times daily at 10 am and 4 pm. CBD gummies; 2 chews 2 times a day.   NP Thyroid 60 MG tablet Generic drug: thyroid TAKE 1 TABLET BY MOUTH EVERY DAY   omeprazole 20 MG capsule Commonly known as: PRILOSEC Take by mouth.   ondansetron 4 MG disintegrating tablet Commonly known as: ZOFRAN-ODT Take 1 tablet (4 mg total) by mouth every 8 (eight) hours as needed for nausea or vomiting.   oxyCODONE 5 MG immediate release tablet Commonly known as: Roxicodone Take 1 tablet (5 mg total) by mouth every 6 (six)  hours as needed for up to 3 days for severe pain.   polyethylene glycol powder 17 GM/SCOOP powder Commonly known as: GLYCOLAX/MIRALAX Take 17 g by mouth daily.   QC TUMERIC COMPLEX PO Take 1,000 mg by mouth daily.               Durable Medical Equipment  (From admission, onward)           Start     Ordered   12/07/21 1324  For home use only DME Hospital bed  Once       Question Answer Comment  Length of Need 6 Months   The above medical condition requires: Patient requires the  ability to reposition frequently   Head must be elevated greater than: 30 degrees   Bed type Semi-electric   Support Surface: Gel Overlay      12/07/21 1323           Allergies  Allergen Reactions   Latex Itching and Rash   Adhesive [Tape] Other (See Comments)    Tears skin   Betadine [Povidone Iodine]     blisters   Ciprofloxacin Nausea And Vomiting   Codeine Nausea And Vomiting    Upsets stomach, nightmares   Penicillins Hives    Has patient had a PCN reaction causing immediate rash, facial/tongue/throat swelling, SOB or lightheadedness with hypotension: yes Has patient had a PCN reaction causing severe rash involving mucus membranes or skin necrosis: yes Has patient had a PCN reaction that required hospitalization: no Has patient had a PCN reaction occurring within the last 10 years: no If all of the above answers are "NO", then may proceed with Cephalosporin use.    Sulfa Antibiotics Hives   Tramadol    Wellbutrin [Bupropion]     Reaction unknown   Aloe Rash      The results of significant diagnostics from this hospitalization (including imaging, microbiology, ancillary and laboratory) are listed below for reference.    Significant Diagnostic Studies: CT ABDOMEN PELVIS W CONTRAST  Result Date: 12/06/2021 CLINICAL DATA:  Weakness and nausea since iron infusion last week. Generalized abdominal pain. EXAM: CT ABDOMEN AND PELVIS WITH CONTRAST TECHNIQUE: Multidetector CT  imaging of the abdomen and pelvis was performed using the standard protocol following bolus administration of intravenous contrast. RADIATION DOSE REDUCTION: This exam was performed according to the departmental dose-optimization program which includes automated exposure control, adjustment of the mA and/or kV according to patient size and/or use of iterative reconstruction technique. CONTRAST:  126mL OMNIPAQUE IOHEXOL 300 MG/ML  SOLN COMPARISON:  September 18, 2017. FINDINGS: Despite efforts by the technologist and patient, motion artifact is present on today's exam and could not be eliminated. This reduces exam sensitivity and specificity. Lower chest: Solid 2.9 cm right upper lobe perihilar pulmonary nodule on image 7/5. There are 2 nodules in the right lung base both of which measure 8 mm on image 13/5. Possible tiny bilateral pleural effusions. Calcifications of mitral annulus. Hepatobiliary: Innumerable bilobar hypodense hepatic lesions for instance a 4.3 x 2.7 cm lesion in the left lobe of the liver on image 21/2 and a 5.2 x 4.4 cm lesion in the right lobe of the liver on image 23/2. Nodular hepatic contour which may reflect cirrhosis. Gallbladder is decompressed however there is prominent wall thickening with trace pericholecystic fluid. No biliary ductal dilation. Pancreas: No pancreatic ductal dilation or evidence of acute inflammation. Spleen: No splenomegaly or focal splenic lesion. Adrenals/Urinary Tract: Bilateral adrenal glands appear normal. No hydronephrosis. Symmetric enhancement and excretion of contrast from bilateral kidneys. Urinary bladder is unremarkable for degree of distension. Stomach/Bowel: No enteric contrast was administered. Stomach is predominantly decompressed limiting evaluation. No pathologic dilation of small or large bowel. No evidence of acute bowel inflammation. Vascular/Lymphatic: Aortic atherosclerosis without abdominal aortic aneurysm. No pathologically enlarged abdominal or  pelvic lymph nodes. Reproductive: Pessary device in the vagina. No suspicious adnexal mass. Other: Trace abdominopelvic free fluid.  No pneumoperitoneum. Musculoskeletal: Diffuse demineralization of bone. Dextroconvex curvature of the thoracolumbar spine. Degenerative changes bilateral hips. Partially visualized left femoral fixation hardware. No aggressive lytic or blastic lesion of bone. IMPRESSION: Examination is significantly degraded by motion. Within this context: 1. Innumerable bilobar  hypodense hepatic lesions, highly suspicious for metastatic disease. 2. Solid right-sided pulmonary nodules, also suspicious for metastatic disease. 3. Nodular hepatic contour which may reflect cirrhosis or sequela of metastatic disease. 4. Gallbladder is decompressed with prominent wall thickening and trace pericholecystic fluid, appearance which is most commonly associated with chronic hepatocellular disease. If concern for acute cholecystitis consider further evaluation with nuclear medicine HIDA scan. 5. Trace abdominopelvic free fluid. 6.  Aortic Atherosclerosis (ICD10-I70.0). * onc * Electronically Signed   By: Dahlia Bailiff M.D.   On: 12/06/2021 10:30   DG Chest Port 1 View  Result Date: 12/05/2021 CLINICAL DATA:  A 88 year old female presents with questionable sepsis. EXAM: PORTABLE CHEST 1 VIEW COMPARISON:  Comparison is made with chest x-ray from December of 2018 and chest CT from 2017. FINDINGS: The images rotated to the RIGHT. Accounting for this cardiomediastinal contours and hilar structures are stable. Lungs are clear aside from nodular changes in the area of the RIGHT infrahilar region/middle lobe just over the RIGHT hemidiaphragm. In the interval there been changes of RIGHT mastectomy. No lobar consolidation or effusion. Signs of prior rib fracture along the posterior LEFT chest, upper chest. Osteopenia. On limited assessment there is no acute skeletal process. IMPRESSION: 1. Persistent nodule and or mass  in the RIGHT infrahilar region is suggested on the current radiograph. This was noted on prior imaging studies dating back to 2018 with no recent imaging for comparison. Correlate with any prior imaging or history to determine whether this has been addressed. 2. RIGHT mastectomy changes with surgical clips over the RIGHT chest and breast. 3. No acute cardiopulmonary disease. Electronically Signed   By: Zetta Bills M.D.   On: 12/05/2021 17:58    Microbiology: Recent Results (from the past 240 hour(s))  Blood Culture (routine x 2)     Status: None (Preliminary result)   Collection Time: 12/05/21  5:36 PM   Specimen: BLOOD  Result Value Ref Range Status   Specimen Description BLOOD LEFT ANTECUBITAL  Final   Special Requests   Final    BOTTLES DRAWN AEROBIC AND ANAEROBIC Blood Culture adequate volume   Culture   Final    NO GROWTH 2 DAYS Performed at [facility] [address] [place], [place] [address]    Report Status PENDING  Incomplete  Blood Culture (routine x 2)     Status: None (Preliminary result)   Collection Time: 12/05/21  5:37 PM   Specimen: BLOOD  Result Value Ref Range Status   Specimen Description BLOOD BLOOD LEFT HAND  Final   Special Requests   Final    BOTTLES DRAWN AEROBIC AND ANAEROBIC Blood Culture adequate volume   Culture   Final    NO GROWTH 2 DAYS Performed at [facility], [address] [place], [place] [address]    Report Status PENDING  Incomplete  Resp Panel by RT-PCR (Flu A&B, Covid) Nasopharyngeal Swab     Status: None   Collection Time: 12/05/21  6:40 PM   Specimen: Nasopharyngeal Swab; Nasopharyngeal(NP) swabs in vial transport medium  Result Value Ref Range Status   SARS Coronavirus 2 by RT PCR NEGATIVE NEGATIVE Final    Comment: (NOTE) SARS-CoV-2 target nucleic acids are NOT DETECTED.  The SARS-CoV-2 RNA is generally detectable in upper respiratory specimens during the acute phase of infection. The lowest concentration of SARS-CoV-2 viral  copies this assay can detect is 138 copies/mL. A negative result does not preclude SARS-Cov-2 infection and should not be used as the sole  basis for treatment or other patient management decisions. A negative result may occur with  improper specimen collection/handling, submission of specimen other than nasopharyngeal swab, presence of viral mutation(s) within the areas targeted by this assay, and inadequate number of viral copies(<138 copies/mL). A negative result must be combined with clinical observations, patient history, and epidemiological information. The expected result is Negative.  Fact Sheet for Patients:  EntrepreneurPulse.com.au  Fact Sheet for Healthcare Providers:  IncredibleEmployment.be  This test is no t yet approved or cleared by the [place] FDA and  has been authorized for detection and/or diagnosis of SARS-CoV-2 by FDA under an Emergency Use Authorization (EUA). This EUA will remain  in effect (meaning this test can be used) for the duration of the COVID-19 declaration under Section 564(b)(1) of the Act, 21 U.S.C.section 360bbb-3(b)(1), unless the authorization is terminated  or revoked sooner.       Influenza A by PCR NEGATIVE NEGATIVE Final   Influenza B by PCR NEGATIVE NEGATIVE Final    Comment: (NOTE) The Xpert Xpress SARS-CoV-2/FLU/RSV plus assay is intended as an aid in the diagnosis of influenza from Nasopharyngeal swab specimens and should not be used as a sole basis for treatment. Nasal washings and aspirates are unacceptable for Xpert Xpress SARS-CoV-2/FLU/RSV testing.  Fact Sheet for Patients: EntrepreneurPulse.com.au  Fact Sheet for Healthcare Providers: IncredibleEmployment.be  This test is not yet approved or cleared by the [place] FDA and has been authorized for detection and/or diagnosis of SARS-CoV-2 by FDA under an Emergency Use Authorization (EUA). This  EUA will remain in effect (meaning this test can be used) for the duration of the COVID-19 declaration under Section 564(b)(1) of the Act, 21 U.S.C. section 360bbb-3(b)(1), unless the authorization is terminated or revoked.  Performed at [facility], [address], [place], [place] [address]   Urine Culture     Status: Abnormal (Preliminary result)   Collection Time: 12/05/21  7:55 PM   Specimen: In/Out Cath Urine  Result Value Ref Range Status   Specimen Description   Final    IN/OUT CATH URINE Performed at [facility], [address], [place], [place] [address]    Special Requests   Final    NONE Performed at [facility], [address] [place], [place] [address]    Culture (A)  Final    >=100,000 COLONIES/mL ESCHERICHIA COLI 60,000 COLONIES/mL PROTEUS MIRABILIS    Report Status PENDING  Incomplete  MRSA Next Gen by PCR, Nasal     Status: None   Collection Time: 12/06/21  4:15 PM   Specimen: Nasal Mucosa; Nasal Swab  Result Value Ref Range Status   MRSA by PCR Next Gen NOT DETECTED NOT DETECTED Final    Comment: (NOTE) The GeneXpert MRSA Assay (FDA approved for NASAL specimens only), is one component of a comprehensive MRSA colonization surveillance program. It is not intended to diagnose MRSA infection nor to guide or monitor treatment for MRSA infections. Test performance is not FDA approved in patients less than 53 years old. Performed at [facility], [address], [place], [place] [address]      Labs: Basic Metabolic Panel: Recent Labs  Lab 12/05/21 1736 12/06/21 0456 12/07/21 0515  NA 136 135 138  K 5.6* 4.7 3.8  CL 105 108 110  CO2 19* 18* 20*  GLUCOSE 89 91 98  BUN 37* 29* 21  CREATININE 1.28* 1.01* 0.81  CALCIUM 9.8 8.8* 8.7*  MG 2.4  --  1.7  PHOS  --   --  2.1*   Liver Function Tests: Recent Labs  Lab 12/05/21 1736 12/06/21 0456 12/07/21 0515  AST 81* 69* 80*  ALT 45* 36 36  ALKPHOS 219* 162* 166*  BILITOT 6.8* 6.2* 8.8*  PROT 6.8  5.0* 5.2*  ALBUMIN 2.7* 2.0* 1.9*   No results for input(s): LIPASE, AMYLASE in the last 168 hours. No results for input(s): AMMONIA in the last 168 hours. CBC: Recent Labs  Lab 12/05/21 1736 12/06/21 0456 12/06/21 0855 12/07/21 0515  WBC 20.5* 18.4*  --  15.6*  NEUTROABS 15.0*  --   --   --   HGB 10.7* 7.7* 7.4* 9.3*  HCT 35.3* 25.5* 25.5* 31.9*  MCV 75.6* 77.3*  --  79.8*  PLT 320 252  --  231   Cardiac Enzymes: No results for input(s): CKTOTAL, CKMB, CKMBINDEX, TROPONINI in the last 168 hours. BNP: BNP (last 3 results) No results for input(s): BNP in the last 8760 hours.  ProBNP (last 3 results) No results for input(s): PROBNP in the last 8760 hours.  CBG: No results for input(s): GLUCAP in the last 168 hours.     Signed:  Kayleen Memos, MD [facility] 12/07/2021, 3:32 PM

## 2021-12-07 NOTE — Care Management Important Message (Signed)
Important Message  Patient Details  Name: Sara Garcia MRN: [id number] Date of Birth: 1928-06-16   Medicare Important Message Given:  Yes     Tommy Medal 12/07/2021, 11:43 AM

## 2021-12-08 LAB — URINE CULTURE: Culture: >=100000 — AB

## 2021-12-08 MED ORDER — DILTIAZEM HCL 25 MG/5ML IV SOLN
5.0000 mg | Freq: Once | INTRAVENOUS | Status: AC
  Administered 2021-12-08: 5 mg via INTRAVENOUS
  Filled 2021-12-08: qty 5

## 2021-12-08 MED ORDER — METOPROLOL TARTRATE 5 MG/5ML IV SOLN
2.5000 mg | Freq: Once | INTRAVENOUS | Status: AC
  Administered 2021-12-08: 2.5 mg via INTRAVENOUS
  Filled 2021-12-08: qty 5

## 2021-12-08 NOTE — Progress Notes (Signed)
CSW spoke with Jinny Blossom at [facility] dispatch who states the patient is still on the list waiting for transportation home.  Madilyn Fireman, MSW, LCSW Transitions of Care   Clinical Social Worker II [phone]

## 2021-12-08 NOTE — Progress Notes (Signed)
Patient seen and evaluated this a.m. with daughter at bedside.  She is awaiting transportation to home so that she may start home hospice protocol.  She appears comfortable at this time with no acute overnight events noted.  Total care time: 15 minutes.

## 2021-12-08 NOTE — Plan of Care (Signed)

## 2021-12-08 NOTE — Progress Notes (Signed)
Patient with HR 130s to 150s. Afib on monitor. MD notified via secure chat. Current BP 109/73 (85)

## 2021-12-08 NOTE — Progress Notes (Signed)
New order was received for lopressor IV push 2.5mg  Minimal success as heart rate remains 130s 140s. MD notified via secure chat

## 2021-12-10 LAB — CULTURE, BLOOD (ROUTINE X 2)
Culture: NO GROWTH
Culture: NO GROWTH
Special Requests: ADEQUATE
Special Requests: ADEQUATE

## 2022-01-12 DEATH — deceased
